# Patient Record
Sex: Female | Born: 1945 | Race: Black or African American | Hispanic: No | Marital: Married | Smoking: Never smoker
Health system: Southern US, Community
[De-identification: ages and names within clinical notes are randomized; demographics above are authoritative.]

## PROBLEM LIST (undated history)

## (undated) DIAGNOSIS — Z923 Personal history of irradiation: Secondary | ICD-10-CM

## (undated) DIAGNOSIS — I10 Essential (primary) hypertension: Secondary | ICD-10-CM

## (undated) DIAGNOSIS — K219 Gastro-esophageal reflux disease without esophagitis: Secondary | ICD-10-CM

## (undated) DIAGNOSIS — C779 Secondary and unspecified malignant neoplasm of lymph node, unspecified: Secondary | ICD-10-CM

## (undated) DIAGNOSIS — J189 Pneumonia, unspecified organism: Secondary | ICD-10-CM

## (undated) DIAGNOSIS — M8580 Other specified disorders of bone density and structure, unspecified site: Secondary | ICD-10-CM

## (undated) DIAGNOSIS — C50919 Malignant neoplasm of unspecified site of unspecified female breast: Secondary | ICD-10-CM

## (undated) DIAGNOSIS — C50912 Malignant neoplasm of unspecified site of left female breast: Secondary | ICD-10-CM

## (undated) DIAGNOSIS — Z9221 Personal history of antineoplastic chemotherapy: Secondary | ICD-10-CM

## (undated) DIAGNOSIS — Z973 Presence of spectacles and contact lenses: Secondary | ICD-10-CM

## (undated) HISTORY — PX: BREAST BIOPSY: SHX20

## (undated) HISTORY — DX: Essential (primary) hypertension: I10

## (undated) HISTORY — DX: Other specified disorders of bone density and structure, unspecified site: M85.80

---

## 1977-07-27 HISTORY — PX: TUBAL LIGATION: SHX77

## 1990-07-27 HISTORY — PX: APPENDECTOMY: SHX54

## 1998-01-04 ENCOUNTER — Ambulatory Visit (HOSPITAL_COMMUNITY): Admission: RE | Admit: 1998-01-04 | Discharge: 1998-01-04 | Payer: Self-pay | Admitting: Internal Medicine

## 1998-06-25 ENCOUNTER — Ambulatory Visit (HOSPITAL_COMMUNITY): Admission: RE | Admit: 1998-06-25 | Discharge: 1998-06-25 | Payer: Self-pay | Admitting: Obstetrics

## 1998-06-25 ENCOUNTER — Encounter: Payer: Self-pay | Admitting: Obstetrics

## 1998-07-27 HISTORY — PX: CHOLECYSTECTOMY: SHX55

## 1998-11-08 ENCOUNTER — Ambulatory Visit: Admission: RE | Admit: 1998-11-08 | Discharge: 1998-11-08 | Payer: Self-pay | Admitting: General Surgery

## 1998-12-05 ENCOUNTER — Encounter (HOSPITAL_BASED_OUTPATIENT_CLINIC_OR_DEPARTMENT_OTHER): Payer: Self-pay | Admitting: General Surgery

## 1998-12-05 ENCOUNTER — Ambulatory Visit (HOSPITAL_COMMUNITY): Admission: RE | Admit: 1998-12-05 | Discharge: 1998-12-06 | Payer: Self-pay | Admitting: General Surgery

## 1999-02-04 ENCOUNTER — Ambulatory Visit (HOSPITAL_COMMUNITY): Admission: RE | Admit: 1999-02-04 | Discharge: 1999-02-04 | Payer: Self-pay | Admitting: Internal Medicine

## 1999-02-04 ENCOUNTER — Encounter: Payer: Self-pay | Admitting: Internal Medicine

## 1999-06-04 ENCOUNTER — Encounter: Payer: Self-pay | Admitting: *Deleted

## 1999-06-04 ENCOUNTER — Ambulatory Visit (HOSPITAL_COMMUNITY): Admission: RE | Admit: 1999-06-04 | Discharge: 1999-06-04 | Payer: Self-pay | Admitting: *Deleted

## 1999-06-09 ENCOUNTER — Encounter: Admission: RE | Admit: 1999-06-09 | Discharge: 1999-06-09 | Payer: Self-pay | Admitting: *Deleted

## 1999-06-09 ENCOUNTER — Encounter: Payer: Self-pay | Admitting: *Deleted

## 2001-03-29 ENCOUNTER — Other Ambulatory Visit: Admission: RE | Admit: 2001-03-29 | Discharge: 2001-03-29 | Payer: Self-pay | Admitting: Internal Medicine

## 2009-08-08 ENCOUNTER — Ambulatory Visit (HOSPITAL_COMMUNITY): Admission: RE | Admit: 2009-08-08 | Discharge: 2009-08-08 | Payer: Self-pay | Admitting: Internal Medicine

## 2012-02-22 ENCOUNTER — Other Ambulatory Visit: Payer: Self-pay | Admitting: Internal Medicine

## 2012-02-24 ENCOUNTER — Other Ambulatory Visit: Payer: Self-pay | Admitting: Internal Medicine

## 2012-02-24 DIAGNOSIS — N644 Mastodynia: Secondary | ICD-10-CM

## 2012-03-08 ENCOUNTER — Other Ambulatory Visit: Payer: Self-pay | Admitting: Radiology

## 2012-03-09 ENCOUNTER — Other Ambulatory Visit: Payer: Self-pay | Admitting: Radiology

## 2012-03-09 DIAGNOSIS — C50911 Malignant neoplasm of unspecified site of right female breast: Secondary | ICD-10-CM

## 2012-03-14 ENCOUNTER — Other Ambulatory Visit: Payer: Self-pay

## 2012-03-14 ENCOUNTER — Telehealth (INDEPENDENT_AMBULATORY_CARE_PROVIDER_SITE_OTHER): Payer: Self-pay | Admitting: General Surgery

## 2012-03-14 NOTE — Telephone Encounter (Signed)
LMOM asking pt to return my call.  This is in regards to me having to move her appt time with Dr. Carolynne Edouard

## 2012-03-24 ENCOUNTER — Encounter (INDEPENDENT_AMBULATORY_CARE_PROVIDER_SITE_OTHER): Payer: Self-pay | Admitting: General Surgery

## 2012-03-25 ENCOUNTER — Ambulatory Visit (INDEPENDENT_AMBULATORY_CARE_PROVIDER_SITE_OTHER): Payer: 59 | Admitting: General Surgery

## 2012-03-25 ENCOUNTER — Other Ambulatory Visit (INDEPENDENT_AMBULATORY_CARE_PROVIDER_SITE_OTHER): Payer: Self-pay | Admitting: General Surgery

## 2012-03-25 ENCOUNTER — Encounter (INDEPENDENT_AMBULATORY_CARE_PROVIDER_SITE_OTHER): Payer: Self-pay | Admitting: General Surgery

## 2012-03-25 VITALS — BP 220/108 | HR 110 | Temp 99.0°F | Resp 16 | Ht 63.0 in | Wt 141.0 lb

## 2012-03-25 DIAGNOSIS — C50919 Malignant neoplasm of unspecified site of unspecified female breast: Secondary | ICD-10-CM | POA: Insufficient documentation

## 2012-03-25 NOTE — Patient Instructions (Addendum)
Will refer to medical oncology and interventional radiology    Port-A-Cath placement at [facility] on 04/01/12.  You need to arrive there at 10:00a with a driver for before and after surgery.  Nothing to eat or drink after midnight the night before surgery.

## 2012-04-01 ENCOUNTER — Other Ambulatory Visit (HOSPITAL_COMMUNITY): Payer: Self-pay

## 2012-04-04 ENCOUNTER — Telehealth: Payer: Self-pay | Admitting: *Deleted

## 2012-04-04 NOTE — Telephone Encounter (Signed)
Called patient to schedule a med onc appt and she refused.  Stated that she does not want to have all the chemo Dr. Carolynne Edouard has spoken with her about so she will follow up with her primary and go an alternate route.  Emailed Musician at [facility] to make her aware.

## 2012-04-05 ENCOUNTER — Encounter (INDEPENDENT_AMBULATORY_CARE_PROVIDER_SITE_OTHER): Payer: Self-pay | Admitting: General Surgery

## 2012-04-05 NOTE — Progress Notes (Signed)
Subjective:     Patient ID: Janice Powell, female   DOB: Dec 27, 1945, 66 y.o.   MRN: [id number]  HPI We're asked to see the patient in consultation by Dr. Isabell Jarvis to evaluate her for a right breast cancer. The patient is a 66 year old black female who has noticed a lump in her right breast for the last 2 years. She has been ignoring it. She has experienced some pain in the right breast. The lump has gradually been getting larger. She has not been going for her routine mammograms. She was advised to attend the multidisciplinary breast clinic where she could meet with all of the doctors involved in her care at one time and she refused. She comes in now for evaluation.  Review of Systems  Constitutional: Negative.   HENT: Negative.   Eyes: Negative.   Respiratory: Negative.   Cardiovascular: Negative.   Gastrointestinal: Negative.   Genitourinary: Negative.   Musculoskeletal: Negative.   Skin: Negative.   Neurological: Negative.   Hematological: Negative.   Psychiatric/Behavioral: Negative.        Objective:   Physical Exam  Constitutional: She is oriented to person, place, and time. She appears well-developed and well-nourished.  HENT:  Head: Normocephalic and atraumatic.  Eyes: Conjunctivae and EOM are normal. Pupils are equal, round, and reactive to light.  Neck: Normal range of motion. Neck supple.  Cardiovascular: Normal rate, regular rhythm and normal heart sounds.   Pulmonary/Chest: Effort normal and breath sounds normal.       The patient has a large mass centrally in the right breast with some overlying skin changes of redness and thickening. She also has a palpable enlarged adenopathy in the right axilla.  Abdominal: Soft. Bowel sounds are normal. She exhibits no mass. There is no tenderness.  Musculoskeletal: Normal range of motion.  Lymphadenopathy:    She has no cervical adenopathy.  Neurological: She is alert and oriented to person, place, and time.  Skin: Skin is warm  and dry.  Psychiatric: She has a normal mood and affect. Her behavior is normal.       Assessment:     The patient has a locally advanced right breast cancer with clinically positive lymph nodes in the right axilla. Because of the size of the tumor and the extensive involvement of the skin I have strongly recommended that she meet with the medical oncologist and consider neoadjuvant chemotherapy. I do not think if we did surgery up front that we would be able to get clean margins. I have discussed her situation with her in detail and I think she is in denial about her diagnosis.    Plan:     I have made her appointments with the medical oncologist and also an appointment to have a Port-A-Cath placed. We will plan to see her back when she is met with the oncologist.

## 2012-04-28 ENCOUNTER — Encounter (INDEPENDENT_AMBULATORY_CARE_PROVIDER_SITE_OTHER): Payer: 59 | Admitting: General Surgery

## 2012-06-06 DIAGNOSIS — N61 Mastitis without abscess: Secondary | ICD-10-CM | POA: Diagnosis not present

## 2012-06-06 DIAGNOSIS — N644 Mastodynia: Secondary | ICD-10-CM | POA: Diagnosis not present

## 2012-06-06 DIAGNOSIS — C50919 Malignant neoplasm of unspecified site of unspecified female breast: Secondary | ICD-10-CM | POA: Diagnosis not present

## 2012-06-07 DIAGNOSIS — N644 Mastodynia: Secondary | ICD-10-CM | POA: Diagnosis not present

## 2012-06-07 DIAGNOSIS — C50919 Malignant neoplasm of unspecified site of unspecified female breast: Secondary | ICD-10-CM | POA: Diagnosis not present

## 2012-06-07 DIAGNOSIS — N61 Mastitis without abscess: Secondary | ICD-10-CM | POA: Diagnosis not present

## 2012-06-09 ENCOUNTER — Telehealth: Payer: Self-pay | Admitting: *Deleted

## 2012-06-09 NOTE — Telephone Encounter (Signed)
Confirmed 06/17/12 appt w/ pt.  Mailed before appt letter & packet to pt.  Emailed Musician at [facility] to make aware.  Called Pat at Dr. Carolee Rota office to make aware.  Took paperwork to Med Rec to scan.

## 2012-06-17 ENCOUNTER — Telehealth: Payer: Self-pay | Admitting: Oncology

## 2012-06-17 ENCOUNTER — Ambulatory Visit (HOSPITAL_BASED_OUTPATIENT_CLINIC_OR_DEPARTMENT_OTHER): Payer: Medicare Other | Admitting: Oncology

## 2012-06-17 ENCOUNTER — Encounter: Payer: Self-pay | Admitting: Oncology

## 2012-06-17 ENCOUNTER — Other Ambulatory Visit (HOSPITAL_BASED_OUTPATIENT_CLINIC_OR_DEPARTMENT_OTHER): Payer: Medicare Other | Admitting: Lab

## 2012-06-17 ENCOUNTER — Other Ambulatory Visit: Payer: Self-pay | Admitting: *Deleted

## 2012-06-17 ENCOUNTER — Ambulatory Visit: Payer: Medicare Other

## 2012-06-17 VITALS — BP 232/115 | HR 112 | Temp 98.1°F | Resp 20 | Ht 63.0 in | Wt 141.5 lb

## 2012-06-17 DIAGNOSIS — C50919 Malignant neoplasm of unspecified site of unspecified female breast: Secondary | ICD-10-CM | POA: Diagnosis not present

## 2012-06-17 DIAGNOSIS — C773 Secondary and unspecified malignant neoplasm of axilla and upper limb lymph nodes: Secondary | ICD-10-CM

## 2012-06-17 DIAGNOSIS — Z171 Estrogen receptor negative status [ER-]: Secondary | ICD-10-CM

## 2012-06-17 DIAGNOSIS — C50319 Malignant neoplasm of lower-inner quadrant of unspecified female breast: Secondary | ICD-10-CM | POA: Diagnosis not present

## 2012-06-17 LAB — COMPREHENSIVE METABOLIC PANEL (CC13)
BUN: 13 mg/dL (ref 7.0–26.0)
CO2: 30 mEq/L — ABNORMAL HIGH (ref 22–29)
Calcium: 9.7 mg/dL (ref 8.4–10.4)
Chloride: 106 mEq/L (ref 98–107)
Creatinine: 0.9 mg/dL (ref 0.6–1.1)
Glucose: 141 mg/dl — ABNORMAL HIGH (ref 70–99)

## 2012-06-17 LAB — CBC WITH DIFFERENTIAL/PLATELET
EOS%: 0.3 % (ref 0.0–7.0)
MCH: 32 pg (ref 25.1–34.0)
MCV: 95 fL (ref 79.5–101.0)
MONO%: 9.5 % (ref 0.0–14.0)
NEUT#: 4.6 10*3/uL (ref 1.5–6.5)
RBC: 4.2 10*6/uL (ref 3.70–5.45)
RDW: 14.3 % (ref 11.2–14.5)

## 2012-06-17 NOTE — Telephone Encounter (Signed)
gve the pt her nov,dec 2013 appt calendar. Pt is aware that she will be contacted with the pet scan/ct scan appts

## 2012-06-17 NOTE — Progress Notes (Signed)
Checked in new pt with no financial concerns. °

## 2012-06-17 NOTE — Progress Notes (Signed)
ID: Janice Powell   DOB: 23-May-1946  MR#: [id number]  CSN#:[id number]  PCP: Janice Moh, MD GYN:  SUClaud Kelp MD OTHER MD:   HISTORY OF PRESENT ILLNESS: [name] noted a mass in her right breast sometime in 2011. 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 More recently her husband twisted her arm to get the mass looked at, and she brought it to Dr. Carolee Powell attention. Diagnostic mammography and right ultrasonography at [facility] 03/02/2012 showed a 6.78 cm irregular mass in the inner aspect of the right breast. There was nipple retraction and erythema around the nipple. The largest lymph node was noted in the right axilla. By ultrasound the large irregular hypoechoic mass was noted in the breast with multiple enlarged axillary lymph nodes. Physical exam confirmed a hard breast with erythema around the right nipple extending inferiorly. The nipple is slightly inverted.  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 Her subsequent history is as detailed below  INTERVAL HISTORY: Janice Powell a came with her husband Janice Powell to the breast clinic 06/17/2012 for discussion of her situation and a definitive treatment plan.  REVIEW OF SYSTEMS: There is some soreness in the right breast on the but no unusual headaches, visual changes, nausea, vomiting, dizziness, cough, phlegm production, pleurisy, shortness of breath, or unexplained weight loss or fatigue. A detailed review of systems today was otherwise entirely negative.  PAST MEDICAL HISTORY: Past Medical History  Diagnosis Date  . Hypertension   . Cancer     right breast  GERD osteopenia  PAST SURGICAL HISTORY: Past Surgical History  Procedure Date  . Appendectomy 1992  . Tubal ligation 1979  . Cholecystectomy 2000    FAMILY HISTORY Family History  Problem Relation Age of Onset  . Heart disease Mother    the patient's father died in his sleep at age 92. The patient's mother died at the age of 51 from a myocardial infarction. The patient has one brother and 2 sisters, all surviving. There is no history of breast or ovarian cancer in the family.  GYNECOLOGIC HISTORY: Menarche age 2, first live birth age 62, she is GX P5, menopause age 15. She did not take hormone replacement.  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 Her husband Janice Powell worked in the post office 41 years, and is also now retired. He is a former Arts development officer. Son Janice Powell lives in [place] and manages an [employer] store. Daughter Janice Powell also lives in [place] and manages the IT Department at [employer]. Son Janice Powell is a Therapist, sports. Daughter Janice Powell is a Doctor, hospital for [employer]. Son Janice Powell is an Teaching laboratory technician. The patient has 8 grandchildren. She attends the white [employer]   ADVANCED DIRECTIVES:  HEALTH MAINTENANCE: History  Substance Use Topics  . Smoking status: Never Smoker   . Smokeless tobacco: Not on file  . Alcohol Use: No     Colonoscopy:  PAP:  Bone density:  Lipid panel:  No Known Allergies  Current Outpatient Prescriptions  Medication Sig Dispense Refill  . cephALEXin (KEFLEX) 500 MG capsule Take 500 mg by mouth 4 (four) times daily.      . hydrocodone-acetaminophen (LORCET-HD) 5-500 MG per capsule Take 1 capsule by mouth 3 (three) times daily as needed.        OBJECTIVE:  Middle-aged [place] American woman who appears well Filed Vitals:   06/17/12 1400  BP: 232/115  Pulse: 112  Temp: 98.1 F (36.7 C)  Resp: 20     Body mass index is 25.07 kg/(m^2).    ECOG FS: 0  Sclerae unicteric Oropharynx clear No cervical or supraclavicular adenopathy Lungs no rales or rhonchi Heart regular rate and rhythm Abd benign MSK no focal spinal tenderness, no peripheral edema Neuro: nonfocal Breasts: The right breast is  firm, and there is significant erythema over the entire central aspect of the breast. There is involvement of the skin by tumor around the areola. There is palpable adenopathy in the right axilla. This entire area was photographed today. The left breast and left exam are unremarkable   LAB RESULTS: Lab Results  Component Value Date   WBC 7.5 06/17/2012      Chemistry      Component Value Date/Time   NA 142 06/17/2012 1349      Component Value Date/Time   CALCIUM 9.7 06/17/2012 1349       No results found for this basename: LABCA2    No components found with this basename: LABCA125    No results found for this basename: INR:1;PROTIME:1 in the last 168 hours  Urinalysis No results found for this basename: colorurine,  appearanceur,  labspec,  phurine,  glucoseu,  hgbur,  bilirubinur,  ketonesur,  proteinur,  urobilinogen,  nitrite,  leukocytesur    STUDIES: No results found.  ASSESSMENT: 66 y.o. [place] woman s/p Right breast and axillary node biopsy 03/08/2012 for a clinical T3-T4, N1-2 invasive ductal carcinoma, triple negative, with an MIB-1 of 93%.  PLAN: We spent well over an hour today discussing the nature of breast cancer and the details of Ms. Wood's specific breast cancer. She understands her cancer is at least stage Powell, and may already be stage IV. Because the treatment of stage Powell and stage IV breast cancer is very different, we need to obtain some scans and these are being operationalized.  We'll so went over her pathology report in detail. Because she has a triple-negative breast cancer, the only systemic treatment option for her as chemotherapy. We specifically discussed docetaxel/ cyclophosphamide/ doxorubicin, to be received by port every 3 weeks for a total of 6 cycles. We have a tentative starting day 8 of December 9, and she will see my physician's assistant December 5 2 receive her antiestrogen prescriptions and instructions on how to take her  medications for nausea and how to use the EMLA cream. The patient says she would like to switch to Dr. Derrell Lolling (though she has not met him) and we will ask him to place a port before December 5 if possible. I directed the patient to "second to nature" to start looking for wakes and alternative brought his. I plan to reassess her treatments after 3 cycles and consider switching to carboplatin/gemcitabine if we are not having a brisk response   Teosha Casso C    06/17/2012

## 2012-06-18 ENCOUNTER — Other Ambulatory Visit: Payer: Self-pay | Admitting: Oncology

## 2012-06-18 LAB — CANCER ANTIGEN 27.29: CA 27.29: 110 U/mL — ABNORMAL HIGH (ref 0–39)

## 2012-06-21 ENCOUNTER — Other Ambulatory Visit: Payer: Medicare Other

## 2012-06-21 ENCOUNTER — Telehealth: Payer: Self-pay | Admitting: *Deleted

## 2012-06-21 NOTE — Telephone Encounter (Signed)
Patient wants to take some time to think about whether she want chemo or not.  Encouraged pt. To let us know next week so can reschedule.

## 2012-06-22 ENCOUNTER — Encounter (HOSPITAL_COMMUNITY): Admission: RE | Admit: 2012-06-22 | Payer: Medicare Other | Source: Ambulatory Visit

## 2012-06-22 ENCOUNTER — Encounter: Payer: Self-pay | Admitting: *Deleted

## 2012-06-22 ENCOUNTER — Ambulatory Visit (HOSPITAL_COMMUNITY): Payer: Medicare Other

## 2012-06-22 NOTE — Progress Notes (Signed)
Mailed after appt letter to pt. 

## 2012-06-27 ENCOUNTER — Other Ambulatory Visit (INDEPENDENT_AMBULATORY_CARE_PROVIDER_SITE_OTHER): Payer: Self-pay | Admitting: General Surgery

## 2012-06-27 ENCOUNTER — Telehealth: Payer: Self-pay | Admitting: *Deleted

## 2012-06-27 NOTE — Telephone Encounter (Signed)
This RN called and left a message on identified VM for a return call to follow up on pt's scheduled appointment this week.  Call was made due to note per education RN discussion with patient.  MD is aware of situation.

## 2012-06-30 ENCOUNTER — Other Ambulatory Visit: Payer: Self-pay | Admitting: Emergency Medicine

## 2012-06-30 ENCOUNTER — Other Ambulatory Visit: Payer: Self-pay | Admitting: Oncology

## 2012-06-30 ENCOUNTER — Ambulatory Visit: Payer: Medicare Other | Admitting: Physician Assistant

## 2012-07-01 ENCOUNTER — Other Ambulatory Visit: Payer: Self-pay | Admitting: Emergency Medicine

## 2012-07-01 ENCOUNTER — Telehealth: Payer: Self-pay | Admitting: Oncology

## 2012-07-01 MED ORDER — HYDROCODONE-ACETAMINOPHEN 5-500 MG PO CAPS: 1.0000 | ORAL_CAPSULE | Freq: Three times a day (TID) | ORAL | Status: DC | PRN

## 2012-07-01 NOTE — Telephone Encounter (Signed)
S/w the pt and she is aware of her Monday appts on 07/04/2012

## 2012-07-04 ENCOUNTER — Telehealth: Payer: Self-pay | Admitting: *Deleted

## 2012-07-04 ENCOUNTER — Ambulatory Visit (HOSPITAL_BASED_OUTPATIENT_CLINIC_OR_DEPARTMENT_OTHER): Payer: Medicare Other | Admitting: Oncology

## 2012-07-04 VITALS — BP 230/100 | HR 97 | Temp 97.3°F | Resp 20 | Ht 63.0 in | Wt 140.9 lb

## 2012-07-04 DIAGNOSIS — C773 Secondary and unspecified malignant neoplasm of axilla and upper limb lymph nodes: Secondary | ICD-10-CM

## 2012-07-04 DIAGNOSIS — Z171 Estrogen receptor negative status [ER-]: Secondary | ICD-10-CM | POA: Diagnosis not present

## 2012-07-04 DIAGNOSIS — I10 Essential (primary) hypertension: Secondary | ICD-10-CM

## 2012-07-04 DIAGNOSIS — C50319 Malignant neoplasm of lower-inner quadrant of unspecified female breast: Secondary | ICD-10-CM | POA: Diagnosis not present

## 2012-07-04 DIAGNOSIS — C50919 Malignant neoplasm of unspecified site of unspecified female breast: Secondary | ICD-10-CM

## 2012-07-04 MED ORDER — LISINOPRIL 10 MG PO TABS: 10.0000 mg | ORAL_TABLET | Freq: Every day | ORAL | Status: DC

## 2012-07-04 MED ORDER — LISINOPRIL 10 MG PO TABS
10.0000 mg | ORAL_TABLET | Freq: Every day | ORAL | Status: DC
Start: 2012-07-04 — End: 2012-07-04

## 2012-07-04 MED ORDER — HYDROCODONE-ACETAMINOPHEN 5-500 MG PO CAPS: 1.0000 | ORAL_CAPSULE | Freq: Three times a day (TID) | ORAL | Status: DC | PRN

## 2012-07-04 MED ORDER — METOPROLOL SUCCINATE ER 50 MG PO TB24: 50.0000 mg | ORAL_TABLET | Freq: Every day | ORAL | Status: DC

## 2012-07-04 NOTE — Telephone Encounter (Signed)
Made patient appointment for 08-23-2012 starting at 10:15am at [facility]

## 2012-07-04 NOTE — Progress Notes (Signed)
ID: Janice Powell   DOB: Nov 27, 1945  MR#: [id number]  CSN#:[id number]  PCP: Janice Moh, MD GYN:  SUClaud Kelp MD OTHER MD:   HISTORY OF PRESENT ILLNESS: [name] noted a mass in her right breast sometime in 2011. 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 More recently her husband twisted her arm to get the mass looked at, and she brought it to Dr. Carolee Powell attention. Diagnostic mammography and right ultrasonography at [facility] 03/02/2012 showed a 6.78 cm irregular mass in the inner aspect of the right breast. There was nipple retraction and erythema around the nipple. The largest lymph node was noted in the right axilla. By ultrasound the large irregular hypoechoic mass was noted in the breast with multiple enlarged axillary lymph nodes. Physical exam confirmed a hard breast with erythema around the right nipple extending inferiorly. The nipple is slightly inverted.  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 Her subsequent history is as detailed below  INTERVAL HISTORY: Janice Powell was seen today with her husband Janice Powell to discuss treatment of her breast cancer further. At the last visit, we had set her up for "chemotherapy school", and staging studies including a PET scan. She had also been set up for port placement. She canceled all these appointments, and has started on "natural treatments" which she purchased after watching an ad on television. She has made it minor changes to her diet as well, but "I always had a good diet". She is exercising every other day.  REVIEW OF SYSTEMS: She continues to have pain in the right breast. She takes Vicodin for this about 3 times daily. This is not constipating her. She also has itching and a small discharge from the right nipple. The discharge is currently clear. She's putting a little bit of Neosporin cream over this area.  Otherwise a detailed review of systems today was entirely negative, and in particular she denies any unusual headaches, visual changes, nausea, vomiting, altered taste, cough, phlegm production, pleurisy, shortness of breath, or any other areas of pain, fever, rash or bleeding. A detailed review of systems was otherwise noncontributory  PAST MEDICAL HISTORY: Past Medical History  Diagnosis Date  . Hypertension   . Cancer     right breast  GERD osteopenia  PAST SURGICAL HISTORY: Past Surgical History  Procedure Date  . Appendectomy 1992  . Tubal ligation 1979  . Cholecystectomy 2000    FAMILY HISTORY Family History  Problem Relation Age of Onset  . Heart disease Mother    the patient's father died in his sleep at age 44. The patient's mother died at the age of 35 from a myocardial infarction. The patient has one brother and 2 sisters, all surviving. There is no history of breast or ovarian cancer in the family.  GYNECOLOGIC HISTORY: Menarche age 76, first live birth age 37, she is GX P5, menopause age 35. She did not take hormone replacement.  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 She is a Janice Powell. Her husband Janice Powell worked in the post office 41 years, and is also now retired. He is a former Arts development officer. Son Janice Powell lives in [place] and manages an [employer] store. Daughter Janice Powell also lives in [place] and manages the IT Department at [employer]. Son Janice Powell is a Therapist, sports. Daughter Janice Powell is a Marine scientist  coordinator for [employer]. Son Janice Powell is an Teaching laboratory technician. The patient has 8 grandchildren. She attends the white [employer]   ADVANCED DIRECTIVES:  HEALTH MAINTENANCE: History  Substance Use Topics  . Smoking status: Never Smoker   . Smokeless tobacco: Not on file  . Alcohol Use: No     Colonoscopy:  PAP:  Bone density:  Lipid panel:  No Known Allergies  Current Outpatient Prescriptions   Medication Sig Dispense Refill  . cephALEXin (KEFLEX) 500 MG capsule Take 500 mg by mouth 4 (four) times daily.      . hydrocodone-acetaminophen (LORCET-HD) 5-500 MG per capsule Take 1 capsule by mouth 3 (three) times daily as needed.  12 capsule  0    OBJECTIVE: Middle-aged [place] American woman in no acute distress Filed Vitals:   07/04/12 1209  BP: 230/100  Pulse: 97  Temp: 97.3 F (36.3 C)  Resp: 20     Body mass index is 24.96 kg/(m^2).    ECOG FS: 1  Sclerae unicteric Oropharynx clear The right neck at [facility] slightly fuller than the left, but I do not palpate definite adenopathy in that area Lungs no rales or rhonchi, good excursion bilaterally Heart regular rate and rhythm Abd soft, nontender, positive bowel sounds, no organomegaly  MSK no focal spinal tenderness Neuro: nonfocal, alert and oriented x3 Breasts: The right breast is hard, barely movable, with a central mass measuring at least 8 cm. The area around the nipple is clearly involved, with erythema which is tracking towards the midline; we photographed the breast today to serve as a baseline. The right axilla shows a fixed lymph node mass measuring 2-3 cm. The left breast is unremarkable. The left axilla is clear.   LAB RESULTS: Lab Results  Component Value Date   WBC 7.5 06/17/2012      Chemistry      Component Value Date/Time   NA 142 06/17/2012 1349      Component Value Date/Time   CALCIUM 9.7 06/17/2012 1349       Lab Results  Component Value Date   LABCA2 110* 06/17/2012    No components found with this basename: LABCA125    No results found for this basename: INR:1;PROTIME:1 in the last 168 hours  Urinalysis No results found for this basename: colorurine,  appearanceur,  labspec,  phurine,  glucoseu,  hgbur,  bilirubinur,  ketonesur,  proteinur,  urobilinogen,  nitrite,  leukocytesur    STUDIES: No results found.  ASSESSMENT: 66 y.o. [name] woman s/p Right breast and axillary  node biopsy 03/08/2012 for a clinical T3-T4, N1-2 , stage Powell invasive ductal carcinoma, triple negative, with an MIB-1 of 93%.  PLAN: We went over her situation again, and I gave her a copy of the Adjuvant! data. This shows that women like her who have surgery alone have a 63% chance of dying from their breast cancer within 10 years. Chemotherapy saves a lives of 12 women like her. This is the standard treatment and it is our recommendation. At this point, however, Ms. Lucente does not want to proceed to chemotherapy, or to surgery. She would like to try "natural treatments". She refuses port placement and staging studies as well.   She agrees to reevaluation "after 2 months", so I have made her a return appointment to see me in 2 months, with lab work and a repeat mammogram and ultrasound of the right breast just before the visit.(She did not want to return to [facility] because she "does not  understand" why they took "so many biopsies" from her breast; I suggested she discuss this with Dr. Yolanda Bonine, but what she was to do is switch over to the Breast Center).  I am concerned that there is some erythema tracking medially. I explained to her that if her cancer crosses the midline it is going to be very difficult if not impossible to surgically remove all the tumor.  Finally her blood pressure was quite high today. This has been noted previously by her primary care physician, and she has not wanted to take blood pressure medication because of side effects. I went ahead and wrote her for lisinopril 10 mg and metoprolol 50 mg to take daily and asked her to have her blood pressure checked either here or at Dr. Carolee Powell office within the week.  I refilled her Vicodin, giving her 90 tablets. She will call if she has other problems before her next visit here.  Keisean Skowron C    07/04/2012

## 2012-07-08 ENCOUNTER — Ambulatory Visit: Admit: 2012-07-08 | Payer: Self-pay | Admitting: General Surgery

## 2012-07-08 SURGERY — INSERTION, TUNNELED CENTRAL VENOUS DEVICE, WITH PORT
Anesthesia: General

## 2012-07-19 ENCOUNTER — Telehealth: Payer: Self-pay | Admitting: *Deleted

## 2012-07-19 ENCOUNTER — Other Ambulatory Visit: Payer: Self-pay | Admitting: Oncology

## 2012-07-19 ENCOUNTER — Other Ambulatory Visit: Payer: Self-pay | Admitting: *Deleted

## 2012-07-19 DIAGNOSIS — C50919 Malignant neoplasm of unspecified site of unspecified female breast: Secondary | ICD-10-CM

## 2012-07-19 MED ORDER — HYDROCODONE-ACETAMINOPHEN 5-325 MG PO TABS: 2.0000 | ORAL_TABLET | Freq: Four times a day (QID) | ORAL | Status: DC | PRN

## 2012-07-19 NOTE — Telephone Encounter (Signed)
Patient called in for refill request, auto-reject from pharmacy sent to triage due to refill being requested too early. Called and spoke to patient, she states she is taking approx. 5-6 tabs a day. She states it is her breast hurting. Informed patient I would get with Dr Darnelle Catalan and his staff to see if we need to change plan for pain control. She will be in and out today, she can be reached on her cell at [phone]. Informed her we will call her back once Dr Darnelle Catalan has reviewed this information

## 2012-08-01 ENCOUNTER — Other Ambulatory Visit: Payer: Self-pay | Admitting: *Deleted

## 2012-08-01 NOTE — Progress Notes (Signed)
Message left by pt stating " please proceed with making me an appointment to get a port, I am ready to do what is needed ".  This RN returned call to pt and left message stating above received, will give to MD for orders as well as making appointments - this RN did leave her name with request to call if further questions.  This message given to MD.

## 2012-08-02 ENCOUNTER — Encounter (INDEPENDENT_AMBULATORY_CARE_PROVIDER_SITE_OTHER): Payer: Self-pay

## 2012-08-02 ENCOUNTER — Telehealth (INDEPENDENT_AMBULATORY_CARE_PROVIDER_SITE_OTHER): Payer: Self-pay | Admitting: General Surgery

## 2012-08-02 ENCOUNTER — Telehealth: Payer: Self-pay | Admitting: *Deleted

## 2012-08-02 ENCOUNTER — Other Ambulatory Visit: Payer: Self-pay | Admitting: *Deleted

## 2012-08-02 NOTE — Telephone Encounter (Signed)
This RN called to Dr Jacinto Halim office and requested an appointment for port placement. Janice Powell will forward request to Dr Derrell Lolling to obtain an appointment ASAP.   This RN spoke with pt discuss other orders not obtiained- staging study and chemo education. Janice Powell states she does not want to do staging study but she does want to proceed with the class.  Electronic POF sent to scheduling for needed appointments.

## 2012-08-02 NOTE — Telephone Encounter (Signed)
Janice Powell with Dr Magrinat's office called and stated patient is ready to go ahead with her PAC insertion. Note from [place] in the system. Orders should be with scheduling. Please reschedule her.

## 2012-08-04 ENCOUNTER — Other Ambulatory Visit: Payer: Self-pay | Admitting: *Deleted

## 2012-08-04 ENCOUNTER — Other Ambulatory Visit: Payer: Self-pay | Admitting: Oncology

## 2012-08-05 DIAGNOSIS — Z23 Encounter for immunization: Secondary | ICD-10-CM | POA: Diagnosis not present

## 2012-08-08 ENCOUNTER — Encounter (HOSPITAL_COMMUNITY): Payer: Self-pay | Admitting: Pharmacy Technician

## 2012-08-09 ENCOUNTER — Other Ambulatory Visit: Payer: Medicare Other

## 2012-08-09 ENCOUNTER — Encounter: Payer: Self-pay | Admitting: *Deleted

## 2012-08-11 ENCOUNTER — Telehealth: Payer: Self-pay | Admitting: *Deleted

## 2012-08-11 ENCOUNTER — Encounter (HOSPITAL_COMMUNITY): Payer: Self-pay

## 2012-08-11 ENCOUNTER — Encounter (HOSPITAL_COMMUNITY)
Admission: RE | Admit: 2012-08-11 | Discharge: 2012-08-11 | Disposition: A | Discharge status: Home / Self Care | Payer: Medicare Other | Source: Ambulatory Visit | Attending: General Surgery | Admitting: General Surgery

## 2012-08-11 ENCOUNTER — Ambulatory Visit (HOSPITAL_BASED_OUTPATIENT_CLINIC_OR_DEPARTMENT_OTHER): Payer: Medicare Other | Admitting: Oncology

## 2012-08-11 DIAGNOSIS — C773 Secondary and unspecified malignant neoplasm of axilla and upper limb lymph nodes: Secondary | ICD-10-CM | POA: Diagnosis not present

## 2012-08-11 DIAGNOSIS — C50919 Malignant neoplasm of unspecified site of unspecified female breast: Secondary | ICD-10-CM | POA: Diagnosis not present

## 2012-08-11 DIAGNOSIS — Z171 Estrogen receptor negative status [ER-]: Secondary | ICD-10-CM

## 2012-08-11 DIAGNOSIS — C50319 Malignant neoplasm of lower-inner quadrant of unspecified female breast: Secondary | ICD-10-CM | POA: Diagnosis not present

## 2012-08-11 DIAGNOSIS — Z79899 Other long term (current) drug therapy: Secondary | ICD-10-CM | POA: Diagnosis not present

## 2012-08-11 DIAGNOSIS — I10 Essential (primary) hypertension: Secondary | ICD-10-CM | POA: Diagnosis not present

## 2012-08-11 DIAGNOSIS — Z01810 Encounter for preprocedural cardiovascular examination: Secondary | ICD-10-CM | POA: Diagnosis not present

## 2012-08-11 DIAGNOSIS — Z01818 Encounter for other preprocedural examination: Secondary | ICD-10-CM | POA: Diagnosis not present

## 2012-08-11 DIAGNOSIS — Z01812 Encounter for preprocedural laboratory examination: Secondary | ICD-10-CM | POA: Diagnosis not present

## 2012-08-11 HISTORY — DX: Gastro-esophageal reflux disease without esophagitis: K21.9

## 2012-08-11 HISTORY — DX: Pneumonia, unspecified organism: J18.9

## 2012-08-11 LAB — CBC
MCH: 31.6 pg (ref 26.0–34.0)
MCV: 94 fL (ref 78.0–100.0)
Platelets: 272 10*3/uL (ref 150–400)
RBC: 4.5 MIL/uL (ref 3.87–5.11)
RDW: 14 % (ref 11.5–15.5)
WBC: 8.5 10*3/uL (ref 4.0–10.5)

## 2012-08-11 LAB — BASIC METABOLIC PANEL
CO2: 28 mEq/L (ref 19–32)
Calcium: 9.3 mg/dL (ref 8.4–10.5)
Creatinine, Ser: 0.75 mg/dL (ref 0.50–1.10)
GFR calc non Af Amer: 86 mL/min — ABNORMAL LOW (ref >90)
Glucose, Bld: 94 mg/dL (ref 70–99)
Sodium: 140 mEq/L (ref 135–145)

## 2012-08-11 LAB — SURGICAL PCR SCREEN: Staphylococcus aureus: NEGATIVE

## 2012-08-11 MED ORDER — DEXAMETHASONE 4 MG PO TABS: ORAL_TABLET | ORAL | Status: DC

## 2012-08-11 MED ORDER — LORAZEPAM 0.5 MG PO TABS: 0.5000 mg | ORAL_TABLET | Freq: Every evening | ORAL | Status: DC | PRN

## 2012-08-11 MED ORDER — LIDOCAINE-PRILOCAINE 2.5-2.5 % EX CREA: TOPICAL_CREAM | Freq: Once | CUTANEOUS | Status: DC

## 2012-08-11 MED ORDER — PROCHLORPERAZINE MALEATE 10 MG PO TABS: 10.0000 mg | ORAL_TABLET | Freq: Four times a day (QID) | ORAL | Status: DC | PRN

## 2012-08-11 NOTE — Progress Notes (Signed)
ID: Janice Powell   DOB: 1946-05-08  MR#: [id number]  CSN#:[id number]  PCP: Janice Moh, MD GYN:  SUClaud Kelp MD OTHER MD:   HISTORY OF PRESENT ILLNESS: [name] noted a mass in her right breast sometime in 2011. 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 More recently her husband twisted her arm to get the mass looked at, and she brought it to Dr. Carolee Rota attention. Diagnostic mammography and right ultrasonography at [facility] 03/02/2012 showed a 6.78 cm irregular mass in the inner aspect of the right breast. There was nipple retraction and erythema around the nipple. The largest lymph node was noted in the right axilla. By ultrasound the large irregular hypoechoic mass was noted in the breast with multiple enlarged axillary lymph nodes. Physical exam confirmed a hard breast with erythema around the right nipple extending inferiorly. The nipple is slightly inverted.  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 Her subsequent history is as detailed below  INTERVAL HISTORY: Janice Powell returns today for further discussion of her breast cancer. She called 08/02/2012 to tell us that she was now ready to consider ordered cath placement and chemotherapy. Incidentally she did start the lisinopril and metoprolol written for her last visit, and although her blood pressure here is still high at (she is very anxious today) she tells me her blood pressure earlier in the day was 144/86, which is certainly a great improvement.  REVIEW OF SYSTEMS: She is having significant pain associated with the right breast and right axilla. There is also a "bad odor" from her right breast. She did try to take natural past take remedy his (particularly minerals) but the pain and following her have convinced her that she needs to give the chemotherapy a try. She is not having severe headaches,  visual changes, nausea, vomiting, gait imbalance or dizziness, cough, phlegm production, pleurisy, or any change in bowel or bladder habits. She doesn't have any bony pain or other pain except associated with the right breast the lesion. A detailed review of systems today was otherwise noncontributory  PAST MEDICAL HISTORY: Past Medical History  Diagnosis Date  . Hypertension   . Cancer     right breast  . Pneumonia     hx  . GERD (gastroesophageal reflux disease)     occ  GERD osteopenia  PAST SURGICAL HISTORY: Past Surgical History  Procedure Date  . Appendectomy 1992  . Tubal ligation 1979  . Cholecystectomy 2000    FAMILY HISTORY Family History  Problem Relation Age of Onset  . Heart disease Mother    the patient's father died in his sleep at age 98. The patient's mother died at the age of 61 from a myocardial infarction. The patient has one brother and 2 sisters, all surviving. There is no history of breast or ovarian cancer in the family.  GYNECOLOGIC HISTORY: Menarche age 41, first live birth age 60, she is GX P5, menopause age 3. She did not take hormone replacement.  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 Her husband Janice Powell worked in the post office 41 years, and is also now retired. He is a former Arts development officer. Son Janice Powell lives in [place] and manages an [employer] store. Daughter Janice Powell also lives in [place] and manages the IT Department at [employer]. Son Janice Powell  is a Therapist, sports. Daughter Janice Powell is a Doctor, hospital for [employer]. Son Janice Powell is an Teaching laboratory technician. The patient has 8 grandchildren. She attends the white [employer]   ADVANCED DIRECTIVES: Not in place  HEALTH MAINTENANCE: History  Substance Use Topics  . Smoking status: Never Smoker   . Smokeless tobacco: Not on file  . Alcohol Use: No     Colonoscopy:  PAP:  Bone density:  Lipid panel:  Not on  File  Current Outpatient Prescriptions  Medication Sig Dispense Refill  . dexamethasone (DECADRON) 4 MG tablet Take 2 tablets by mouth twice a day as directed  30 tablet  3  . HYDROcodone-acetaminophen (NORCO/VICODIN) 5-325 MG per tablet Take 2 tablets by mouth every 6 (six) hours as needed for pain.  120 tablet  0  . lisinopril (PRINIVIL) 10 MG tablet Take 1 tablet (10 mg total) by mouth daily.  30 tablet  12  . LORazepam (ATIVAN) 0.5 MG tablet Take 1 tablet (0.5 mg total) by mouth at bedtime as needed for anxiety.  30 tablet  0  . metoprolol succinate (TOPROL XL) 50 MG 24 hr tablet Take 1 tablet (50 mg total) by mouth daily. Take with or immediately following a meal.  30 tablet  12  . prochlorperazine (COMPAZINE) 10 MG tablet Take 1 tablet (10 mg total) by mouth every 6 (six) hours as needed (as directed).  30 tablet  0   Current Facility-Administered Medications  Medication Dose Route Frequency Provider Last Rate Last Dose  . lidocaine-prilocaine (EMLA) cream   Topical Once Lowella Dell, MD        OBJECTIVE: Middle-aged [place] American woman in no acute distress There were no vitals filed for this visit.   There is no height or weight on file to calculate BMI.    ECOG FS: 1  Sclerae unicteric Oropharynx clear No cervical or supraclavicular adenopathy palpated Lungs no rales or rhonchi, good excursion bilaterally Heart regular rate and rhythm Abd soft, nontender, positive bowel sounds, no organomegaly  MSK no focal spinal tenderness Neuro: nonfocal, well oriented,  Breasts:  The right breast continues to have significant involvement of the entire areolar area, but it is now much more pronounced, with increased erythema and some oozing. On the plus side, the erythema that seemed to be tracking across the midline to the left breast is not apparent today. The right breast is firm and hard, and there is fixed adenopathy in the right axilla. The left breast is unremarkable. (The right  breast was again photographed today).  LAB RESULTS: Lab Results  Component Value Date   WBC 8.5 08/11/2012      Chemistry      Component Value Date/Time   NA 140 08/11/2012 1336   NA 142 06/17/2012 1349      Component Value Date/Time   CALCIUM 9.3 08/11/2012 1336   CALCIUM 9.7 06/17/2012 1349       Lab Results  Component Value Date   LABCA2 110* 06/17/2012    No components found with this basename: LABCA125    No results found for this basename: INR:1;PROTIME:1 in the last 168 hours  Urinalysis No results found for this basename: colorurine,  appearanceur,  labspec,  phurine,  glucoseu,  hgbur,  bilirubinur,  ketonesur,  proteinur,  urobilinogen,  nitrite,  leukocytesur    STUDIES: No results found.  ASSESSMENT: 67 y.o. [place] woman s/p Right breast and axillary node biopsy 03/08/2012 for a clinical T4, N1-2', stage  Powell invasive ductal carcinoma, grade 3, triple negative, with an MIB-1 of 93%.  PLAN: I am relieved that Janice Powell has decided to give chemotherapy a try. We are going to a go with cyclophosphamide, docetaxel and doxorubicin given every 3 weeks for 6 cycles, starting January 28. She understands she needs to start dexamethasone the day before treatment, and she was given a "map" of how to take her dexamethasone, prochlorperazine and lorazepam, as well as how to use her EMLA cream. She will see our physicians Asst. Zollie Scale on the 28th to make sure she understands the directions correctly, and we will set her up for her remaining of visits at that time. I would like to see her at the time of her third visit.  Before we can start she needs an echocardiogram which is being scheduled for the next few days, and her port placed, which has been scheduled for January 20. She knows to call for any problems that may develop before the next visit. Alayne Estrella C    08/11/2012

## 2012-08-11 NOTE — Pre-Procedure Instructions (Addendum)
Janice Powell  08/11/2012   Your procedure is scheduled on:  08/15/12  Report to [facility] at 715AM.  Call this number if you have problems the morning of surgery: [phone]   Remember:   Do not eat food or drink liquids after midnight.   Take these medicines the morning of surgery with A SIP OF WATER: pain med, metoprolol   Do not wear jewelry, make-up or nail polish.  Do not wear lotions, powders, or perfumes. You may not wear deodorant.  Do not shave 48 hours prior to surgery. Men may shave face and neck.  Do not bring valuables to the hospital.  Contacts, dentures or bridgework may not be worn into surgery.  Leave suitcase in the car. After surgery it may be brought to your room.  For patients admitted to the hospital, checkout time is 11:00 AM the day of  discharge.   Patients discharged the day of surgery will not be allowed to drive  home.  Name and phone number of your driver: [phone]  Special Instructions: Shower using CHG 2 nights before surgery and the night before surgery.  If you shower the day of surgery use CHG.  Use special wash - you have one bottle of CHG for all showers.  You should use approximately 1/3 of the bottle for each shower.   Please read over the following fact sheets that you were given: Pain Booklet, Coughing and Deep Breathing, MRSA Information and Surgical Site Infection Prevention

## 2012-08-11 NOTE — Telephone Encounter (Signed)
see berry 8:45 1/28; chemo same day (TAC) and repeat every 3 weeks x6; labs w all treatments;  Sent michelle email to set up treatment

## 2012-08-12 ENCOUNTER — Ambulatory Visit (HOSPITAL_COMMUNITY): Payer: Medicare Other

## 2012-08-12 ENCOUNTER — Telehealth: Payer: Self-pay | Admitting: *Deleted

## 2012-08-12 NOTE — Telephone Encounter (Signed)
Gave patient appointment for echo  Emailed michelle to set up treatment

## 2012-08-12 NOTE — H&P (Addendum)
Janice Powell     MRN: [id number]   Description: 67 year old female    Diagnoses     Breast cancer   - Primary    174.9     History and Physical  Angelia Mould. Derrell Lolling, MC,FACS ID: [name]   DOB: 1946/05/24  MR#: [id number]  EXB#:[id number]           HISTORY OF PRESENT ILLNESS:   This patient noticed a right breast mass in 2011.  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  More recently, following encouragement from her husband,  she brought it to Dr. Carolee Rota attention. Diagnostic mammography and right ultrasonography at [facility] 03/02/2012 showed a 6.78 cm irregular mass in the inner aspect of the right breast. There was nipple retraction and erythema around the nipple. A large lymph node was noted in the right axilla. By ultrasound the large irregular hypoechoic mass was noted in the breast with multiple enlarged axillary lymph nodes. Physical exam confirmed a hard breast with erythema around the right nipple extending inferiorly. The nipple is slightly inverted. Palpable axillary nodes were noted. 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 Her subsequent history is  detailed below   She had previously been evaluated by Dr. Chevis Pretty on Aug. 30, 2013 and had declined any surgical intervention or other treatment.   She had previously scheduled and subsequently  cancelled her breast MRI and her PET-CT.  She initially declined chemotherapy,  but has now reconsidered and is willing to have a port placed and to be treated with neoadjuvant chemotherapy.  I was asked to place the port.    REVIEW OF SYSTEMS: She is having significant pain associated with the right breast and right axilla. There is also a "bad odor" from her right breast. She did try to take natural  remedies (particularly minerals) but the pain continued and that has  convinced her  that she needs to give treatment a try.   Dr. Darnelle Catalan plans chemotherapy and requested that [facility] place a port. She is not having severe headaches, visual changes, nausea, vomiting, gait imbalance or dizziness, cough, phlegm production, pleurisy, or any change in bowel or bladder habits. She doesn't have any bony pain or other pain except associated with the right breast the lesion. A detailed review of systems  was otherwise noncontributory   PAST MEDICAL HISTORY: Past Medical History   Diagnosis  Date   .  Hypertension     .  Cancer         right breast   .  Pneumonia         hx   .  GERD (gastroesophageal reflux disease)         occ    GERD osteopenia   PAST SURGICAL HISTORY: Past Surgical History   Procedure  Date   .  Appendectomy  1992   .  Tubal ligation  1979   .  Cholecystectomy  2000      FAMILY HISTORY Family History   Problem  Relation  Age of Onset   .  Heart disease  Mother      the patient's father died in his sleep at age 84. The patient's mother died at the age of 31 from a myocardial infarction. The patient has one brother and 2 sisters, all surviving. There is no  history of breast or ovarian cancer in the family.   GYNECOLOGIC HISTORY: Menarche age 51, first live birth age 55, she is GX P5, menopause age 89. She did not take hormone replacement.   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 Her husband [name] worked in the post office 41 years, and is also now retired. He is a former Arts development officer. Son [name] lives in [place] and manages an [employer] store. Daughter Lesette Frary also lives in [place] and manages the IT Department at [employer]. Son English Tomer III is a Therapist, sports. Daughter [name] is a Doctor, hospital for [employer]. Son [name] is an Teaching laboratory technician. The patient has 8 grandchildren. She attends the white [employer]         HEALTH MAINTENANCE: History   Substance Use  Topics   .  Smoking status:  Never Smoker    .  Smokeless tobacco:  Not on file   .  Alcohol Use:  No       Current Outpatient Prescriptions   Medication  Sig  Dispense  Refill   .  dexamethasone (DECADRON) 4 MG tablet  Take 2 tablets by mouth twice a day as directed   30 tablet   3   .  HYDROcodone-acetaminophen (NORCO/VICODIN) 5-325 MG per tablet  Take 2 tablets by mouth every 6 (six) hours as needed for pain.   120 tablet   0   .  lisinopril (PRINIVIL) 10 MG tablet  Take 1 tablet (10 mg total) by mouth daily.   30 tablet   12   .  LORazepam (ATIVAN) 0.5 MG tablet  Take 1 tablet (0.5 mg total) by mouth at bedtime as needed for anxiety.   30 tablet   0   .  metoprolol succinate (TOPROL XL) 50 MG 24 hr tablet  Take 1 tablet (50 mg total) by mouth daily. Take with or immediately following a meal.   30 tablet   12   .  prochlorperazine (COMPAZINE) 10 MG tablet  Take 1 tablet (10 mg total) by mouth every 6 (six) hours as needed (as directed).   30 tablet   0                EXAM:  : Middle-aged African American woman in no acute distress There were no vitals filed for this visit.   There is no height or weight on file to calculate BMI.   Functional status 1. Sclerae clear No cervical or supraclavicular adenopathy palpated Lungs Clear to auscultation bilaterally Heart: regular rate and rhythm, no ectopy. Abd: soft, nontender, positive bowel sounds, no organomegaly   Neuro: nonfocal, well oriented,   Breasts:  The right breast has significant involvement of the entire areolar area, but it is now much more pronounced, with increased erythema and some oozing. .On the plus side, the erythema that seemed to be tracking across the midline to the left breast is not apparent today. The right breast is firm and hard, and there is Palpable, fixed adenopathy in the right axilla. The left breast is unremarkable.      ASSESSMENT: 67 y.o. [place] woman with neglected, locally advanced cancer right  breast.  Right breast and axillary node biopsy 03/08/2012 reveals  a clinical T4, N1-N2, stage IIIB,  invasive ductal carcinoma, grade 3, triple negative, with an MIB-1 of 93%.    The patient was initially reluctant to have surgical or medical  intervention for her cancer but has now reconsidering.  Hypertension GERD.    PLAN:She has decided to give chemotherapy a try. She was referred by Dr. Darnelle Catalan for port placement.   Angelia Mould. Derrell Lolling, M.D., [facility] General and Minimally invasive Surgery Breast and Colorectal Surgery Office:   [phone] Pager:   [phone]

## 2012-08-14 MED ORDER — CEFAZOLIN SODIUM-DEXTROSE 2-3 GM-% IV SOLR
2.0000 g | INTRAVENOUS | Status: AC
  Administered 2012-08-15: 2 g via INTRAVENOUS
  Filled 2012-08-14: qty 50

## 2012-08-15 ENCOUNTER — Ambulatory Visit (HOSPITAL_COMMUNITY): Payer: Medicare Other

## 2012-08-15 ENCOUNTER — Ambulatory Visit (HOSPITAL_COMMUNITY)
Admission: RE | Admit: 2012-08-15 | Discharge: 2012-08-15 | Disposition: A | Discharge status: Home / Self Care | Payer: Medicare Other | Source: Ambulatory Visit | Attending: General Surgery | Admitting: General Surgery

## 2012-08-15 ENCOUNTER — Ambulatory Visit (HOSPITAL_COMMUNITY): Payer: Medicare Other | Admitting: Certified Registered"

## 2012-08-15 ENCOUNTER — Encounter (HOSPITAL_COMMUNITY)
Admission: RE | Disposition: A | Discharge status: Home / Self Care | Payer: Self-pay | Source: Ambulatory Visit | Attending: General Surgery

## 2012-08-15 ENCOUNTER — Encounter (HOSPITAL_COMMUNITY): Payer: Self-pay | Admitting: Certified Registered"

## 2012-08-15 DIAGNOSIS — I10 Essential (primary) hypertension: Secondary | ICD-10-CM | POA: Diagnosis not present

## 2012-08-15 DIAGNOSIS — C50919 Malignant neoplasm of unspecified site of unspecified female breast: Secondary | ICD-10-CM

## 2012-08-15 DIAGNOSIS — K219 Gastro-esophageal reflux disease without esophagitis: Secondary | ICD-10-CM | POA: Diagnosis not present

## 2012-08-15 DIAGNOSIS — Z01818 Encounter for other preprocedural examination: Secondary | ICD-10-CM | POA: Insufficient documentation

## 2012-08-15 DIAGNOSIS — Z01810 Encounter for preprocedural cardiovascular examination: Secondary | ICD-10-CM | POA: Diagnosis not present

## 2012-08-15 DIAGNOSIS — Z01812 Encounter for preprocedural laboratory examination: Secondary | ICD-10-CM | POA: Insufficient documentation

## 2012-08-15 DIAGNOSIS — Z79899 Other long term (current) drug therapy: Secondary | ICD-10-CM | POA: Insufficient documentation

## 2012-08-15 DIAGNOSIS — J9819 Other pulmonary collapse: Secondary | ICD-10-CM | POA: Diagnosis not present

## 2012-08-15 HISTORY — PX: PORTACATH PLACEMENT: SHX2246

## 2012-08-15 SURGERY — INSERTION, TUNNELED CENTRAL VENOUS DEVICE, WITH PORT
Anesthesia: General | Site: Chest | Laterality: Left | Wound class: Clean

## 2012-08-15 MED ORDER — HEPARIN SOD (PORK) LOCK FLUSH 100 UNIT/ML IV SOLN
INTRAVENOUS | Status: AC
  Filled 2012-08-15: qty 5

## 2012-08-15 MED ORDER — PROMETHAZINE HCL 25 MG/ML IJ SOLN: 6.2500 mg | INTRAMUSCULAR | Status: DC | PRN

## 2012-08-15 MED ORDER — HYDRALAZINE HCL 20 MG/ML IJ SOLN
5.0000 mg | Freq: Two times a day (BID) | INTRAMUSCULAR | Status: DC | PRN
  Administered 2012-08-15: 5 mg via INTRAVENOUS

## 2012-08-15 MED ORDER — HEPARIN SOD (PORK) LOCK FLUSH 100 UNIT/ML IV SOLN
INTRAVENOUS | Status: DC | PRN
  Administered 2012-08-15: 300 [IU] via INTRAVENOUS

## 2012-08-15 MED ORDER — HYDROCODONE-ACETAMINOPHEN 5-325 MG PO TABS: 1.0000 | ORAL_TABLET | ORAL | Status: DC | PRN

## 2012-08-15 MED ORDER — LACTATED RINGERS IV SOLN
INTRAVENOUS | Status: DC
  Administered 2012-08-15: 09:00:00 via INTRAVENOUS

## 2012-08-15 MED ORDER — HYDRALAZINE HCL 20 MG/ML IJ SOLN
INTRAMUSCULAR | Status: AC
  Filled 2012-08-15: qty 1

## 2012-08-15 MED ORDER — CHLORHEXIDINE GLUCONATE 4 % EX LIQD: 1.0000 "application " | Freq: Once | CUTANEOUS | Status: DC

## 2012-08-15 MED ORDER — OXYCODONE HCL 5 MG PO TABS: 5.0000 mg | ORAL_TABLET | Freq: Once | ORAL | Status: DC | PRN

## 2012-08-15 MED ORDER — MIDAZOLAM HCL 5 MG/5ML IJ SOLN
INTRAMUSCULAR | Status: DC | PRN
  Administered 2012-08-15: 2 mg via INTRAVENOUS

## 2012-08-15 MED ORDER — BUPIVACAINE-EPINEPHRINE PF 0.25-1:200000 % IJ SOLN
INTRAMUSCULAR | Status: AC
  Filled 2012-08-15: qty 30

## 2012-08-15 MED ORDER — OXYCODONE HCL 5 MG/5ML PO SOLN: 5.0000 mg | Freq: Once | ORAL | Status: DC | PRN

## 2012-08-15 MED ORDER — FENTANYL CITRATE 0.05 MG/ML IJ SOLN
INTRAMUSCULAR | Status: DC | PRN
  Administered 2012-08-15: 50 ug via INTRAVENOUS
  Administered 2012-08-15: 25 ug via INTRAVENOUS

## 2012-08-15 MED ORDER — BUPIVACAINE HCL 0.25 % IJ SOLN
INTRAMUSCULAR | Status: DC | PRN
  Administered 2012-08-15: 9 mL

## 2012-08-15 MED ORDER — LIDOCAINE HCL (CARDIAC) 20 MG/ML IV SOLN
INTRAVENOUS | Status: DC | PRN
  Administered 2012-08-15: 90 mg via INTRAVENOUS

## 2012-08-15 MED ORDER — HYDROMORPHONE HCL PF 1 MG/ML IJ SOLN: 0.2500 mg | INTRAMUSCULAR | Status: DC | PRN

## 2012-08-15 MED ORDER — PROPOFOL 10 MG/ML IV BOLUS
INTRAVENOUS | Status: DC | PRN
  Administered 2012-08-15: 150 mg via INTRAVENOUS

## 2012-08-15 MED ORDER — SODIUM CHLORIDE 0.9 % IR SOLN
Status: DC | PRN
  Administered 2012-08-15: 10:00:00

## 2012-08-15 MED ORDER — LACTATED RINGERS IV SOLN
INTRAVENOUS | Status: DC | PRN
  Administered 2012-08-15: 09:00:00 via INTRAVENOUS

## 2012-08-15 MED ORDER — ONDANSETRON HCL 4 MG/2ML IJ SOLN
INTRAMUSCULAR | Status: DC | PRN
  Administered 2012-08-15: 4 mg via INTRAVENOUS

## 2012-08-15 SURGICAL SUPPLY — 59 items
ADH SKN CLS APL DERMABOND .7 (GAUZE/BANDAGES/DRESSINGS) ×1
BAG DECANTER FOR FLEXI CONT (MISCELLANEOUS) ×2 IMPLANT
BLADE SURG 10 STRL SS (BLADE) ×2 IMPLANT
BLADE SURG 15 STRL LF DISP TIS (BLADE) ×1 IMPLANT
BLADE SURG 15 STRL SS (BLADE) ×2
BLADE SURG ROTATE 9660 (MISCELLANEOUS) IMPLANT
CANISTER SUCTION 2500CC (MISCELLANEOUS) IMPLANT
CHLORAPREP W/TINT 10.5 ML (MISCELLANEOUS) ×2 IMPLANT
CLOTH BEACON ORANGE TIMEOUT ST (SAFETY) ×2 IMPLANT
COVER PROBE W GEL 5X96 (DRAPES) IMPLANT
COVER SURGICAL LIGHT HANDLE (MISCELLANEOUS) ×2 IMPLANT
CRADLE DONUT ADULT HEAD (MISCELLANEOUS) ×2 IMPLANT
DECANTER SPIKE VIAL GLASS SM (MISCELLANEOUS) ×2 IMPLANT
DERMABOND ADVANCED (GAUZE/BANDAGES/DRESSINGS) ×1
DERMABOND ADVANCED .7 DNX12 (GAUZE/BANDAGES/DRESSINGS) ×1 IMPLANT
DRAPE C-ARM 42X72 X-RAY (DRAPES) ×2 IMPLANT
DRAPE LAPAROSCOPIC ABDOMINAL (DRAPES) ×2 IMPLANT
DRAPE UTILITY 15X26 W/TAPE STR (DRAPE) ×4 IMPLANT
ELECT CAUTERY BLADE 6.4 (BLADE) ×2 IMPLANT
ELECT REM PT RETURN 9FT ADLT (ELECTROSURGICAL) ×2
ELECTRODE REM PT RTRN 9FT ADLT (ELECTROSURGICAL) ×1 IMPLANT
GAUZE SPONGE 4X4 16PLY XRAY LF (GAUZE/BANDAGES/DRESSINGS) ×2 IMPLANT
GLOVE BIOGEL PI IND STRL 7.0 (GLOVE) IMPLANT
GLOVE BIOGEL PI INDICATOR 7.0 (GLOVE) ×1
GLOVE EUDERMIC 7 POWDERFREE (GLOVE) ×2 IMPLANT
GLOVE SURG SS PI 6.0 STRL IVOR (GLOVE) ×1 IMPLANT
GLOVE SURG SS PI 6.5 STRL IVOR (GLOVE) ×1 IMPLANT
GOWN PREVENTION PLUS XLARGE (GOWN DISPOSABLE) ×2 IMPLANT
GOWN STRL NON-REIN LRG LVL3 (GOWN DISPOSABLE) ×3 IMPLANT
INTRODUCER 13FR (MISCELLANEOUS) IMPLANT
INTRODUCER COOK 11FR (CATHETERS) IMPLANT
KIT BASIN OR (CUSTOM PROCEDURE TRAY) ×2 IMPLANT
KIT PORT POWER 8FR ISP CVUE (Catheter) ×1 IMPLANT
KIT PORT POWER 9.6FR MRI PREA (Catheter) IMPLANT
KIT PORT POWER ISP 8FR (Catheter) IMPLANT
KIT POWER CATH 8FR (Catheter) IMPLANT
KIT ROOM TURNOVER OR (KITS) ×2 IMPLANT
NDL HYPO 25GX1X1/2 BEV (NEEDLE) ×1 IMPLANT
NEEDLE HYPO 25GX1X1/2 BEV (NEEDLE) ×2 IMPLANT
NS IRRIG 1000ML POUR BTL (IV SOLUTION) ×2 IMPLANT
PACK SURGICAL SETUP 50X90 (CUSTOM PROCEDURE TRAY) ×2 IMPLANT
PAD ARMBOARD 7.5X6 YLW CONV (MISCELLANEOUS) ×2 IMPLANT
PENCIL BUTTON HOLSTER BLD 10FT (ELECTRODE) ×2 IMPLANT
SET INTRODUCER 12FR PACEMAKER (SHEATH) IMPLANT
SET SHEATH INTRODUCER 10FR (MISCELLANEOUS) IMPLANT
SHEATH COOK PEEL AWAY SET 9F (SHEATH) IMPLANT
SURGILUBE 3G PEEL PACK STRL (MISCELLANEOUS) IMPLANT
SUT MNCRL AB 4-0 PS2 18 (SUTURE) ×2 IMPLANT
SUT PROLENE 2 0 CT2 30 (SUTURE) ×2 IMPLANT
SUT VIC AB 3-0 SH 18 (SUTURE) ×2 IMPLANT
SYR 20ML ECCENTRIC (SYRINGE) ×4 IMPLANT
SYR 5ML LUER SLIP (SYRINGE) ×2 IMPLANT
SYR BULB 3OZ (MISCELLANEOUS) ×2 IMPLANT
SYR CONTROL 10ML LL (SYRINGE) ×2 IMPLANT
SYRINGE 10CC LL (SYRINGE) ×2 IMPLANT
TOWEL OR 17X24 6PK STRL BLUE (TOWEL DISPOSABLE) ×2 IMPLANT
TOWEL OR 17X26 10 PK STRL BLUE (TOWEL DISPOSABLE) ×2 IMPLANT
TUBE CONNECTING 12X1/4 (SUCTIONS) IMPLANT
YANKAUER SUCT BULB TIP NO VENT (SUCTIONS) IMPLANT

## 2012-08-15 NOTE — Transfer of Care (Signed)
Immediate Anesthesia Transfer of Care Note  Patient: Janice Powell  Procedure(s) Performed: Procedure(s) (LRB) with comments: INSERTION PORT-A-CATH (Left)  Patient Location: PACU  Anesthesia Type:General  Level of Consciousness: awake, alert  and oriented  Airway & Oxygen Therapy: Patient Spontanous Breathing and Patient connected to nasal cannula oxygen  Post-op Assessment: Report given to PACU RN, Post -op Vital signs reviewed and stable and Patient moving all extremities  Post vital signs: Reviewed and stable  Complications: No apparent anesthesia complications

## 2012-08-15 NOTE — Op Note (Signed)
Patient Name:           Janice Powell   Date of Surgery:        08/15/2012  Pre op Diagnosis:      Invasive cancer right breast, triple negative,  locally advanced, clinical stage T4, N1-N2, stage IIIB.  Post op Diagnosis:    Same  Procedure:                 Insertion of 8 French ClearVUE power port venous access device with fluoroscopic guidance and positioning.  Surgeon:                     Angelia Mould. Derrell Lolling, M.D., FACS  Assistant:                      None  Operative Indications:   This patient noticed a right breast mass in 2011. 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 More recently, she brought it to Dr. Carolee Rota attention. Diagnostic mammography and right ultrasonography at [facility] 03/02/2012 showed a 6.7 cm irregular mass in the inner aspect of the right breast. There was nipple retraction and erythema around the nipple. A large lymph node was noted in the right axilla. By ultrasound the large irregular hypoechoic mass was noted in the breast with multiple enlarged axillary lymph nodes. Physical exam confirmed a hard breast with erythema around the right nipple extending inferiorly. The nipple is slightly inverted. Palpable axillary nodes were noted.  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 Her subsequent history is detailed below  She had previously been evaluated by Dr. Chevis Pretty on Aug. 30, 2013 and had declined any surgical intervention or other treatment.  She has declined breast MRI and her PET-CT to date.  She initially declined chemotherapy, but has now reconsidered and is willing to have a port placed and to be treated with neoadjuvant chemotherapy. I was asked to place the port.   Operative Findings:       The right breast cancer was noted to fill the right breast. The entire nipple and areola are destroyed by invasion of the cancer onto the  skin. The port was inserted into the left subclavian vein without difficulty, and the intraoperative fluoroscopy showed good positioning of the catheter tip in the superior vena cava.  Procedure in Detail:          Following the induction of general LMA anesthesia, the patient was positioned with a small roll under her shoulders and her arms at her sides. The neck and chest were prepped and draped in a sterile fashion. Intravenous antibiotics were given. Surgical time out was performed. 0.25% Marcaine with epinephrine was used as a local infiltration anesthetic. A left subclavian venipuncture was performed with a single pass, and a guidewire inserted into the superior vena cava. Fluoroscopy was utilized to confirm position of the wire and also to draw a template on the chest wall for positioning of the catheter so the tip would be in the superior vena cava near the right atrium.  A transverse incision was made approximately 2-1/2 cm below the left clavicle. A little bit of subcutaneous tissue was debrided and a pocket was created at the level of the pectoralis fascia. Using a tunneling device I drew the catheter from the port pocket site to the wire insertion  site. The catheter was measured and positioned using the template guide previously drawn and I cut the catheter about 21 cm in length. The catheter was then secured to the port with the locking device and the port and catheter flushed with heparinized saline. The port was sutured to the pectoralis fascia with 3 interrupted sutures of 2-0 Prolene. The dilator and peel-away sheath were inserted over the guidewire into the central venous circulation, and the dilator and guidewire removed. The catheter was threaded into the peel-away sheath and the peel-away sheath removed. The catheter flushed easily and had excellent blood return. Fluoroscopy confirmed the tip of the catheter was in the superior vena cava near the right atrium as planned, and there is no  deformity of the catheter anywhere along its length. The port and catheter were then flushed with concentrated heparin solution. Subcutaneous tissue was closed with 3-0 Vicryl sutures and skin incisions closed with subcuticular sutures of 4-0 Monocryl and Dermabond. The patient tolerated the procedure well and was taken to recovery room stable. Counts correct. EBL 10 cc. A chest x-ray is planned in PACU.     Angelia Mould. Derrell Lolling, M.D., FACS General and Minimally Invasive Surgery Breast and Colorectal Surgery  08/15/2012 9:42 AM

## 2012-08-15 NOTE — Anesthesia Preprocedure Evaluation (Signed)
Anesthesia Evaluation  Patient identified by MRN, date of birth, ID band Patient awake    Reviewed: Allergy & Precautions, H&P , NPO status , Patient's Chart, lab work & pertinent test results, reviewed documented beta blocker date and time   History of Anesthesia Complications Negative for: history of anesthetic complications  Airway Mallampati: I TM Distance: >3 FB Neck ROM: Full    Dental  (+) Edentulous Upper and Dental Advisory Given   Pulmonary neg pulmonary ROS,    Pulmonary exam normal       Cardiovascular hypertension, Pt. on home beta blockers     Neuro/Psych negative neurological ROS     GI/Hepatic Neg liver ROS, GERD-  ,  Endo/Other  negative endocrine ROS  Renal/GU negative Renal ROS     Musculoskeletal   Abdominal   Peds  Hematology negative hematology ROS (+)   Anesthesia Other Findings   Reproductive/Obstetrics                           Anesthesia Physical Anesthesia Plan  ASA: II  Anesthesia Plan: General   Post-op Pain Management:    Induction: Intravenous  Airway Management Planned: LMA  Additional Equipment:   Intra-op Plan:   Post-operative Plan: Extubation in OR  Informed Consent: I have reviewed the patients History and Physical, chart, labs and discussed the procedure including the risks, benefits and alternatives for the proposed anesthesia with the patient or authorized representative who has indicated his/her understanding and acceptance.   Dental advisory given  Plan Discussed with: CRNA, Anesthesiologist and Surgeon  Anesthesia Plan Comments:         Anesthesia Quick Evaluation

## 2012-08-15 NOTE — Preoperative (Signed)
Beta Blockers   Reason not to administer Beta Blockers:taken this am @0600 

## 2012-08-15 NOTE — Anesthesia Postprocedure Evaluation (Signed)
Anesthesia Post Note  Patient: Janice Powell  Procedure(s) Performed: Procedure(s) (LRB): INSERTION PORT-A-CATH (Left)  Anesthesia type: general  Patient location: PACU  Post pain: Pain level controlled  Post assessment: Patient's Cardiovascular Status Stable  Last Vitals:  Filed Vitals:   08/15/12 1000  BP: 184/73  Pulse: 59  Temp:   Resp: 20    Post vital signs: Reviewed and stable  Level of consciousness: sedated  Complications: No apparent anesthesia complications

## 2012-08-15 NOTE — Interval H&P Note (Signed)
History and Physical Interval Note:  08/15/2012 8:44 AM  Janice Powell  has presented today for surgery, with the diagnosis of breast cancer  She has requested that I place her port a cath.  The goals and the various methods of treatment and the numerous risks have been discussed with the patient. After consideration of risks, benefits and other options for treatment, the patient has consented to  Procedure(s) (LRB) with comments: INSERTION PORT-A-CATH (N/A) as a surgical intervention .  The patient's history has been reviewed, patient examined today, no change in status, stable for surgery.  I have reviewed the patient's chart and labs.  Questions were answered to the patient's satisfaction.     Angelia Mould. Derrell Lolling, M.D., [facility] General and Minimally invasive Surgery Breast and Colorectal Surgery Office:   [phone] Pager:   [phone]

## 2012-08-16 ENCOUNTER — Ambulatory Visit (HOSPITAL_COMMUNITY): Payer: Medicare Other

## 2012-08-16 ENCOUNTER — Telehealth (INDEPENDENT_AMBULATORY_CARE_PROVIDER_SITE_OTHER): Payer: Self-pay | Admitting: General Surgery

## 2012-08-16 NOTE — Telephone Encounter (Signed)
Called patient at home to advise of post op appointment for port-a-cath insertion. Patient scheduled to see Dr. Derrell Lolling on 10/18/12 at 3:30.

## 2012-08-17 ENCOUNTER — Encounter (HOSPITAL_COMMUNITY): Payer: Self-pay | Admitting: General Surgery

## 2012-08-17 ENCOUNTER — Other Ambulatory Visit: Payer: Medicare Other | Admitting: Lab

## 2012-08-19 ENCOUNTER — Ambulatory Visit (HOSPITAL_COMMUNITY)
Admission: RE | Admit: 2012-08-19 | Discharge: 2012-08-19 | Disposition: A | Discharge status: Home / Self Care | Payer: Medicare Other | Source: Ambulatory Visit | Attending: Oncology | Admitting: Oncology

## 2012-08-19 DIAGNOSIS — M899 Disorder of bone, unspecified: Secondary | ICD-10-CM | POA: Insufficient documentation

## 2012-08-19 DIAGNOSIS — K219 Gastro-esophageal reflux disease without esophagitis: Secondary | ICD-10-CM | POA: Diagnosis not present

## 2012-08-19 DIAGNOSIS — Z01818 Encounter for other preprocedural examination: Secondary | ICD-10-CM | POA: Diagnosis not present

## 2012-08-19 DIAGNOSIS — C50919 Malignant neoplasm of unspecified site of unspecified female breast: Secondary | ICD-10-CM | POA: Diagnosis not present

## 2012-08-19 DIAGNOSIS — M949 Disorder of cartilage, unspecified: Secondary | ICD-10-CM | POA: Insufficient documentation

## 2012-08-19 DIAGNOSIS — I10 Essential (primary) hypertension: Secondary | ICD-10-CM | POA: Diagnosis not present

## 2012-08-19 DIAGNOSIS — D4989 Neoplasm of unspecified behavior of other specified sites: Secondary | ICD-10-CM

## 2012-08-23 ENCOUNTER — Other Ambulatory Visit (HOSPITAL_BASED_OUTPATIENT_CLINIC_OR_DEPARTMENT_OTHER): Payer: Medicare Other | Admitting: Lab

## 2012-08-23 ENCOUNTER — Other Ambulatory Visit: Payer: Self-pay | Admitting: Oncology

## 2012-08-23 ENCOUNTER — Encounter: Payer: Self-pay | Admitting: Physician Assistant

## 2012-08-23 ENCOUNTER — Ambulatory Visit (HOSPITAL_BASED_OUTPATIENT_CLINIC_OR_DEPARTMENT_OTHER): Payer: Medicare Other | Admitting: Physician Assistant

## 2012-08-23 ENCOUNTER — Telehealth: Payer: Self-pay | Admitting: Oncology

## 2012-08-23 ENCOUNTER — Ambulatory Visit (HOSPITAL_BASED_OUTPATIENT_CLINIC_OR_DEPARTMENT_OTHER): Payer: Medicare Other

## 2012-08-23 VITALS — BP 168/71 | HR 66 | Temp 98.9°F | Resp 20

## 2012-08-23 VITALS — BP 219/86 | HR 86 | Temp 99.1°F | Resp 20 | Ht 63.0 in | Wt 139.8 lb

## 2012-08-23 DIAGNOSIS — C50919 Malignant neoplasm of unspecified site of unspecified female breast: Secondary | ICD-10-CM

## 2012-08-23 DIAGNOSIS — C50319 Malignant neoplasm of lower-inner quadrant of unspecified female breast: Secondary | ICD-10-CM

## 2012-08-23 DIAGNOSIS — C773 Secondary and unspecified malignant neoplasm of axilla and upper limb lymph nodes: Secondary | ICD-10-CM

## 2012-08-23 DIAGNOSIS — I10 Essential (primary) hypertension: Secondary | ICD-10-CM

## 2012-08-23 DIAGNOSIS — Z171 Estrogen receptor negative status [ER-]: Secondary | ICD-10-CM

## 2012-08-23 DIAGNOSIS — C801 Malignant (primary) neoplasm, unspecified: Secondary | ICD-10-CM | POA: Insufficient documentation

## 2012-08-23 DIAGNOSIS — Z5111 Encounter for antineoplastic chemotherapy: Secondary | ICD-10-CM | POA: Diagnosis not present

## 2012-08-23 LAB — COMPREHENSIVE METABOLIC PANEL (CC13)
ALT: 17 U/L (ref 0–55)
AST: 14 U/L (ref 5–34)
Albumin: 3.7 g/dL (ref 3.5–5.0)
Alkaline Phosphatase: 53 U/L (ref 40–150)
Calcium: 9.5 mg/dL (ref 8.4–10.4)
Chloride: 104 mEq/L (ref 98–107)
Potassium: 3.1 mEq/L — ABNORMAL LOW (ref 3.5–5.1)
Sodium: 140 mEq/L (ref 136–145)

## 2012-08-23 LAB — CBC WITH DIFFERENTIAL/PLATELET
BASO%: 0.1 % (ref 0.0–2.0)
EOS%: 0 % (ref 0.0–7.0)
HCT: 42.4 % (ref 34.8–46.6)
MCH: 30.9 pg (ref 25.1–34.0)
MCHC: 33 g/dL (ref 31.5–36.0)
MONO#: 1.4 10*3/uL — ABNORMAL HIGH (ref 0.1–0.9)
RBC: 4.53 10*6/uL (ref 3.70–5.45)
RDW: 14.2 % (ref 11.2–14.5)
WBC: 12.4 10*3/uL — ABNORMAL HIGH (ref 3.9–10.3)
lymph#: 2.8 10*3/uL (ref 0.9–3.3)
nRBC: 0 % (ref 0–0)

## 2012-08-23 MED ORDER — SODIUM CHLORIDE 0.9 % IV SOLN
500.0000 mg/m2 | Freq: Once | INTRAVENOUS | Status: AC
  Administered 2012-08-23: 840 mg via INTRAVENOUS
  Filled 2012-08-23: qty 42

## 2012-08-23 MED ORDER — DOCETAXEL CHEMO INJECTION 160 MG/16ML
75.0000 mg/m2 | Freq: Once | INTRAVENOUS | Status: AC
  Administered 2012-08-23: 130 mg via INTRAVENOUS
  Filled 2012-08-23: qty 13

## 2012-08-23 MED ORDER — SODIUM CHLORIDE 0.9 % IV SOLN
Freq: Once | INTRAVENOUS | Status: AC
  Administered 2012-08-23: 10:00:00 via INTRAVENOUS

## 2012-08-23 MED ORDER — CLONIDINE HCL 0.1 MG PO TABS: 0.1000 mg | ORAL_TABLET | Freq: Once | ORAL | Status: DC

## 2012-08-23 MED ORDER — DOXORUBICIN HCL CHEMO IV INJECTION 2 MG/ML
50.0000 mg/m2 | Freq: Once | INTRAVENOUS | Status: AC
  Administered 2012-08-23: 84 mg via INTRAVENOUS
  Filled 2012-08-23: qty 42

## 2012-08-23 MED ORDER — PALONOSETRON HCL INJECTION 0.25 MG/5ML
0.2500 mg | Freq: Once | INTRAVENOUS | Status: AC
  Administered 2012-08-23: 0.25 mg via INTRAVENOUS

## 2012-08-23 MED ORDER — DEXAMETHASONE SODIUM PHOSPHATE 4 MG/ML IJ SOLN
12.0000 mg | Freq: Once | INTRAMUSCULAR | Status: AC
  Administered 2012-08-23: 12 mg via INTRAVENOUS

## 2012-08-23 MED ORDER — SODIUM CHLORIDE 0.9 % IV SOLN
150.0000 mg | Freq: Once | INTRAVENOUS | Status: AC
  Administered 2012-08-23: 150 mg via INTRAVENOUS
  Filled 2012-08-23: qty 5

## 2012-08-23 NOTE — Telephone Encounter (Signed)
gv pt appt schedule for January thru March.  °

## 2012-08-23 NOTE — Progress Notes (Signed)
ID: Janice Powell   DOB: Nov 26, 1945  MR#: [id number]  CSN#:[id number]  PCP: Londell Moh, MD GYN:  SUClaud Kelp MD OTHER MD:   HISTORY OF PRESENT ILLNESS: Janice Powell noted a mass in her right breast sometime in 2011. 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 More recently her husband twisted her arm to get the mass looked at, and Janice Powell brought it to Dr. Carolee Rota attention. Diagnostic mammography and right ultrasonography at [facility] 03/02/2012 showed a 6.78 cm irregular mass in the inner aspect of the right breast. There was nipple retraction and erythema around the nipple. The largest lymph node was noted in the right axilla. By ultrasound the large irregular hypoechoic mass was noted in the breast with multiple enlarged axillary lymph nodes. Physical exam confirmed a hard breast with erythema around the right nipple extending inferiorly. The nipple is slightly inverted.  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 Her subsequent history is as detailed below  INTERVAL HISTORY: Janice Powell returns today accompanied by her husband [name] for further discussion of her locally advanced right breast cancer. Janice Powell is ready to initiate neoadjuvant chemotherapy today, consisting of docetaxel/doxorubicin/cyclophosphamide to be given every 3 weeks. Today is day 1 cycle 1 of 6.  The patient had her port placed last week on January 20 with no complications. The area is healing well. Janice Powell also had an echocardiogram last week which showed a good ejection fraction of 60-65%. Janice Powell has all of her anti-emetics at home, and start her oral dexamethasone yesterday as instructed.  The biggest concern is the fact that her blood pressure continues to run very high. Janice Powell took her metoprolol and lisinopril this morning, and here in the office today her blood pressure is still elevated at 220/105. Janice Powell  denies any chest pain or palpitations has had no abnormal headaches except for shortly after taking the dexamethasone yesterday.   REVIEW OF SYSTEMS: Janice Powell denies any fevers or chills. Janice Powell's had no rashes or skin changes. Janice Powell continues to have significant pain associated with the right breast and right axilla, for which Janice Powell is taking hydrocodone/APAP as prescribed by Dr. Derrell Lolling. There is also a "bad odor" from her right breast. Janice Powell's eating and drinking well with no nausea or emesis. Janice Powell tends to be slightly constipated but manages to have regular bowel movements with the use of MiraLAX. Janice Powell's had no cough, phlegm production, pleurisy, or increased shortness of breath. Janice Powell denies any myalgias, arthralgias, or bony pain. Janice Powell's had no peripheral swelling, and a baseline denies any peripheral neuropathy.  A detailed review of systems is otherwise stable and noncontributory.    PAST MEDICAL HISTORY: Past Medical History  Diagnosis Date  . Hypertension   . Cancer     right breast  . Pneumonia     hx  . GERD (gastroesophageal reflux disease)     occ  GERD osteopenia  PAST SURGICAL HISTORY: Past Surgical History  Procedure Date  . Appendectomy 1992  . Tubal ligation 1979  . Cholecystectomy 2000  . Portacath placement 08/15/2012    Procedure: INSERTION PORT-A-CATH;  Surgeon: Ernestene Mention, MD;  Location: [facility] OR;  Service: General;  Laterality: Left;    FAMILY HISTORY Family History  Problem Relation Age of Onset  . Heart disease Mother    the patient's father died in his sleep at age 45. The patient's mother died at  the age of 59 from a myocardial infarction. The patient has one brother and 2 sisters, all surviving. There is no history of breast or ovarian cancer in the family.  GYNECOLOGIC HISTORY: Menarche age 60, first live birth age 25, Janice Powell is GX P5, menopause age 41. Janice Powell did not take hormone replacement.  SOCIAL HISTORY: Janice Powell, Janice Powell. Janice Powell retired  earlier this year. 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 Her husband [name] worked in the post office 41 years, and is also now retired. He is a former Arts development officer. Son [name] lives in [place] and manages an [employer] store. Daughter Ria Redcay also lives in [place] and manages the IT Department at [employer]. Son Armilda Vanderlinden III is a Therapist, sports. Daughter [name] is a Doctor, hospital for [employer]. Son [name] is an Teaching laboratory technician. The patient has 8 grandchildren. Janice Powell attends the white [employer]   ADVANCED DIRECTIVES: Not in place  HEALTH MAINTENANCE: History  Substance Use Topics  . Smoking status: Never Smoker   . Smokeless tobacco: Not on file  . Alcohol Use: No     Colonoscopy:  PAP:  Bone density:  Lipid panel:  No Known Allergies  Current Outpatient Prescriptions  Medication Sig Dispense Refill  . dexamethasone (DECADRON) 4 MG tablet Take 2 tablets by mouth twice a day as directed  30 tablet  3  . HYDROcodone-acetaminophen (NORCO/VICODIN) 5-325 MG per tablet Take 1-2 tablets by mouth every 4 (four) hours as needed for pain.  50 tablet  1  . lidocaine-prilocaine (EMLA) cream       . lisinopril (PRINIVIL) 10 MG tablet Take 1 tablet (10 mg total) by mouth daily.  30 tablet  12  . LORazepam (ATIVAN) 0.5 MG tablet Take 1 tablet (0.5 mg total) by mouth at bedtime as needed for anxiety.  30 tablet  0  . metoprolol succinate (TOPROL XL) 50 MG 24 hr tablet Take 1 tablet (50 mg total) by mouth daily. Take with or immediately following a meal.  30 tablet  12  . prochlorperazine (COMPAZINE) 10 MG tablet Take 1 tablet (10 mg total) by mouth every 6 (six) hours as needed (as directed).  30 tablet  0   Current Facility-Administered Medications  Medication Dose Route Frequency Provider Last Rate Last Dose  . cloNIDine (CATAPRES) tablet 0.1 mg  0.1 mg Oral Once Catalina Gravel, PA        OBJECTIVE: Middle-aged [place] American Powell in no acute distress Filed Vitals:   08/23/12 0855  BP: 219/86    Pulse:   Temp:   Resp:      Body mass index is 24.76 kg/(m^2).    ECOG FS: 1 Filed Weights   08/23/12 0843  Weight: 139 lb 12.8 oz (63.413 kg)   Sclerae unicteric Oropharynx clear No cervical or supraclavicular adenopathy palpated Lungs no rales or rhonchi, good excursion bilaterally Heart regular rate and rhythm Abdomen  soft, nontender, positive bowel sounds MSK no focal spinal tenderness Neuro: nonfocal, well oriented,  Breasts:  Deferred   LAB RESULTS: Lab Results  Component Value Date   WBC 12.4* 08/23/2012      Chemistry      Component Value Date/Time   NA 140 08/11/2012 1336   NA 142 06/17/2012 1349      Component Value Date/Time   CALCIUM 9.3 08/11/2012 1336   CALCIUM 9.7 06/17/2012 1349       Lab Results  Component Value Date   LABCA2 110* 06/17/2012  STUDIES:  Echocardiogram on 08/19/2012 showed an ejection fraction of 60-65%.     Dg Chest Port 1 View  08/15/2012  *RADIOLOGY REPORT*  Clinical Data: Postop Port-A-Cath placement.  PORTABLE CHEST - 1 VIEW  Comparison: 08/11/2012.  Findings: Trachea is midline.  Left subclavian Port-A-Cath tip projects over the SVC.  Heart size stable.  Minimal left lower lobe subsegmental atelectasis.  No pleural fluid.  No pneumothorax.  IMPRESSION:  1.  Left subclavian Port-A-Cath with without complicating feature. 2.  Left lower lobe subsegmental atelectasis.   Original Report Authenticated By: Leanna Battles, M.D.    Dg Fluoro Guide Cv Line-no Report  08/15/2012  CLINICAL DATA: portacath insertion   FLOURO GUIDE CV LINE  Fluoroscopy was utilized by the requesting physician.  No radiographic  interpretation.       ASSESSMENT: 67 y.o. Janice Powell   (1)  s/p Right breast and axillary node biopsy 03/08/2012 for a clinical T4, N1-2', stage III invasive ductal carcinoma, grade 3, triple negative, with an MIB-1 of 93%.  (2)  being treated in the neoadjuvant setting, the goal being to complete 6 cycles of  docetaxel/doxorubicin/cyclophosphamide given every 3 weeks with Neulasta on day 2 for granulocyte support  (3) hypertension, poorly controlled  PLAN: Shemeika will proceed to chemotherapy today as scheduled for her first of 6 planned q. three-week doses of docetaxel/doxorubicin/cyclophosphamide given in the neoadjuvant setting. Janice Powell received Neulasta on day 2, and I will see her again next week in [place] for for assessment of chemotoxicity. We reviewed all of her antiemetic regimen today which will include dexamethasone (Janice Powell began that medication yesterday), prochlorperazine, and lorazepam. Janice Powell also knows to utilize Claritin for several days following the Neulasta injection to decrease pain, and Janice Powell also has a prescription for Vicodin from Dr. Derrell Lolling.  Janice Powell was given all of this information in writing today.  We'll keep a very close eye on her blood pressure. It was very elevated today, and we have given her Catapres, 0.1 mg x1 prior to chemotherapy. If her blood pressure is still high next week, we may need to reconsider her hypertensive therapy, and perhaps refer her back to her primary care physician, Dr. Renne Crigler, for further evaluation.  Christyanna voices understanding and agreement with our plan, noticed called any changes or problems.    Hazael Olveda    08/23/2012

## 2012-08-23 NOTE — Patient Instructions (Signed)
[facility] Discharge Instructions for Patients Receiving Chemotherapy  Today you received the following chemotherapy agents: Docetaxel, Adriamycin, Cytoxan.  To help prevent nausea and vomiting after your treatment, we encourage you to take your nausea medication.    If you develop nausea and vomiting that is not controlled by your nausea medication, call the clinic.   BELOW ARE SYMPTOMS THAT SHOULD BE REPORTED IMMEDIATELY:  *FEVER GREATER THAN 100.5 F  *CHILLS WITH OR WITHOUT FEVER  NAUSEA AND VOMITING THAT IS NOT CONTROLLED WITH YOUR NAUSEA MEDICATION  *UNUSUAL SHORTNESS OF BREATH  *UNUSUAL BRUISING OR BLEEDING  TENDERNESS IN MOUTH AND THROAT WITH OR WITHOUT PRESENCE OF ULCERS  *URINARY PROBLEMS  *BOWEL PROBLEMS  UNUSUAL RASH Items with * indicate a potential emergency and should be followed up as soon as possible.  One of the nurses will contact you 24 hours after your treatment. Please let the nurse know about any problems that you may have experienced. Feel free to call the clinic you have any questions or concerns. The clinic phone number is [phone].

## 2012-08-24 ENCOUNTER — Ambulatory Visit: Payer: Medicare Other | Admitting: Oncology

## 2012-08-24 ENCOUNTER — Ambulatory Visit (HOSPITAL_BASED_OUTPATIENT_CLINIC_OR_DEPARTMENT_OTHER): Payer: Medicare Other

## 2012-08-24 ENCOUNTER — Telehealth: Payer: Self-pay | Admitting: *Deleted

## 2012-08-24 VITALS — BP 196/84 | HR 65 | Temp 98.2°F

## 2012-08-24 DIAGNOSIS — C773 Secondary and unspecified malignant neoplasm of axilla and upper limb lymph nodes: Secondary | ICD-10-CM | POA: Diagnosis not present

## 2012-08-24 DIAGNOSIS — C50919 Malignant neoplasm of unspecified site of unspecified female breast: Secondary | ICD-10-CM

## 2012-08-24 DIAGNOSIS — C50319 Malignant neoplasm of lower-inner quadrant of unspecified female breast: Secondary | ICD-10-CM

## 2012-08-24 DIAGNOSIS — C801 Malignant (primary) neoplasm, unspecified: Secondary | ICD-10-CM

## 2012-08-24 MED ORDER — PEGFILGRASTIM INJECTION 6 MG/0.6ML
6.0000 mg | Freq: Once | SUBCUTANEOUS | Status: AC
  Administered 2012-08-24: 6 mg via SUBCUTANEOUS
  Filled 2012-08-24: qty 0.6

## 2012-08-24 NOTE — Telephone Encounter (Signed)
Patient here for Neulasta injection following 1st tac chemo treatment.  States not having any nausea, vomiting or diarrhea.  Doesn't have an appetite.  Encouraged to drink lots of fluids and eat small frequent meals to get her protein in.  Knows to call the clinic if she has any problems or questions. Her BP is elevated which is not unusual for her.  States that MD is working on it   BP  Left arm 222/78 (automatic machine).   Rechecked 5-10 minutes later manually  BP 196/84.

## 2012-08-26 ENCOUNTER — Ambulatory Visit (HOSPITAL_BASED_OUTPATIENT_CLINIC_OR_DEPARTMENT_OTHER): Payer: Medicare Other

## 2012-08-26 ENCOUNTER — Telehealth: Payer: Self-pay | Admitting: *Deleted

## 2012-08-26 ENCOUNTER — Other Ambulatory Visit: Payer: Self-pay | Admitting: *Deleted

## 2012-08-26 VITALS — BP 188/78 | HR 97 | Temp 99.2°F

## 2012-08-26 DIAGNOSIS — R0789 Other chest pain: Secondary | ICD-10-CM

## 2012-08-26 DIAGNOSIS — C50919 Malignant neoplasm of unspecified site of unspecified female breast: Secondary | ICD-10-CM

## 2012-08-26 DIAGNOSIS — C50319 Malignant neoplasm of lower-inner quadrant of unspecified female breast: Secondary | ICD-10-CM | POA: Diagnosis not present

## 2012-08-26 MED ORDER — SODIUM CHLORIDE 0.9 % IV SOLN
Freq: Once | INTRAVENOUS | Status: AC
  Administered 2012-08-26: 14:00:00 via INTRAVENOUS

## 2012-08-26 MED ORDER — HYDROCODONE-ACETAMINOPHEN 5-325 MG PO TABS
1.0000 | ORAL_TABLET | Freq: Once | ORAL | Status: AC
  Administered 2012-08-26: 1 via ORAL

## 2012-08-26 MED ORDER — POTASSIUM CHLORIDE ER 10 MEQ PO CPCR: 10.0000 meq | ORAL_CAPSULE | Freq: Two times a day (BID) | ORAL | Status: DC

## 2012-08-26 MED ORDER — LISINOPRIL 10 MG PO TABS
10.0000 mg | ORAL_TABLET | Freq: Once | ORAL | Status: AC
  Administered 2012-08-26: 10 mg via ORAL
  Filled 2012-08-26: qty 1

## 2012-08-26 MED ORDER — LORAZEPAM 2 MG/ML IJ SOLN
0.2500 mg | Freq: Once | INTRAMUSCULAR | Status: AC
  Administered 2012-08-26: 0.25 mg via INTRAVENOUS

## 2012-08-26 NOTE — Telephone Encounter (Signed)
This RN spoke with pt per her call stating onset of symptoms and inquiring " what ER should I go to ?".  Per conversation pt is not able to obtain temp due to thermometer not reading correctly. Pt denies chills or cough. She does feel weak, hot, nausea with anorexia as well as not able to take in fluids well. [name] states she is urinating well with color as "yellow".  She used antinausea medication yesterday with minimial benefit, but has taken any today with inquiry " is it ok to take it if I am not eating ".  Husband was able to state pt's skin was warm and dry " feels normal to me ".  Per call pt will come in to this clinic at 130pm to obtain IVF's.

## 2012-08-26 NOTE — Progress Notes (Signed)
Patient c/o tightness at sternum, denies any SOB, VS 207/108 106 99.2. Dr Darnelle Catalan notified and received orders for Lisinopril 10 mg po and Ativan 0.25 mg IV.  Patient continued to c/o tightness at sternum, BP now 188/78. Order received for Vicodin.

## 2012-08-26 NOTE — Telephone Encounter (Signed)
This RN spoke with pt while in treatment room receiving IVF.  Reviewed and given written instructions regarding self care and use of appropriate medications for symptom management. This RN also answered pt's questions regarding pain issues and reasons certain medications used, including reiterating need to maintain bp at theraputic level and possible outcome if not controlled.  Pt's husband and daughter was at chairside and verbalized understanding with pt regarding above recommendations.

## 2012-08-26 NOTE — Patient Instructions (Addendum)
Dehydration, Adult Dehydration is when you lose more fluids from the body than you take in. Vital organs like the kidneys, brain, and heart cannot function without a proper amount of fluids and salt. Any loss of fluids from the body can cause dehydration.  CAUSES   Vomiting.  Diarrhea.  Excessive sweating.  Excessive urine output.  Fever. SYMPTOMS  Mild dehydration  Thirst.  Dry lips.  Slightly dry mouth. Moderate dehydration  Very dry mouth.  Sunken eyes.  Skin does not bounce back quickly when lightly pinched and released.  Dark urine and decreased urine production.  Decreased tear production.  Headache. Severe dehydration  Very dry mouth.  Extreme thirst.  Rapid, weak pulse (more than 100 beats per minute at rest).  Cold hands and feet.  Not able to sweat in spite of heat and temperature.  Rapid breathing.  Blue lips.  Confusion and lethargy.  Difficulty being awakened.  Minimal urine production.  No tears. DIAGNOSIS  Your caregiver will diagnose dehydration based on your symptoms and your exam. Blood and urine tests will help confirm the diagnosis. The diagnostic evaluation should also identify the cause of dehydration. TREATMENT  Treatment of mild or moderate dehydration can often be done at home by increasing the amount of fluids that you drink. It is best to drink small amounts of fluid more often. Drinking too much at one time can make vomiting worse. Refer to the home care instructions below. Severe dehydration needs to be treated at the hospital where you will probably be given intravenous (IV) fluids that contain water and electrolytes. HOME CARE INSTRUCTIONS   Ask your caregiver about specific rehydration instructions.  Drink enough fluids to keep your urine clear or pale yellow.  Drink small amounts frequently if you have nausea and vomiting.  Eat as you normally do.  Avoid:  Foods or drinks high in sugar.  Carbonated  drinks.  Juice.  Extremely hot or cold fluids.  Drinks with caffeine.  Fatty, greasy foods.  Alcohol.  Tobacco.  Overeating.  Gelatin desserts.  Wash your hands well to avoid spreading bacteria and viruses.  Only take over-the-counter or prescription medicines for pain, discomfort, or fever as directed by your caregiver.  Ask your caregiver if you should continue all prescribed and over-the-counter medicines.  Keep all follow-up appointments with your caregiver. SEEK MEDICAL CARE IF:  You have abdominal pain and it increases or stays in one area (localizes).  You have a rash, stiff neck, or severe headache.  You are irritable, sleepy, or difficult to awaken.  You are weak, dizzy, or extremely thirsty. SEEK IMMEDIATE MEDICAL CARE IF:   You are unable to keep fluids down or you get worse despite treatment.  You have frequent episodes of vomiting or diarrhea.  You have blood or green matter (bile) in your vomit.  You have blood in your stool or your stool looks black and tarry.  You have not urinated in 6 to 8 hours, or you have only urinated a small amount of very dark urine.  You have a fever.  You faint. MAKE SURE YOU:   Understand these instructions.  Will watch your condition.  Will get help right away if you are not doing well or get worse. Document Released: 07/13/2005 Document Revised: 10/05/2011 Document Reviewed: 03/02/2011 Exit[employer] Patient Information 2013 [employer].  

## 2012-08-30 ENCOUNTER — Telehealth: Payer: Self-pay | Admitting: *Deleted

## 2012-08-30 ENCOUNTER — Other Ambulatory Visit: Payer: Medicare Other | Admitting: Lab

## 2012-08-30 ENCOUNTER — Ambulatory Visit: Payer: Medicare Other | Admitting: Physician Assistant

## 2012-08-30 ENCOUNTER — Telehealth: Payer: Self-pay | Admitting: Oncology

## 2012-08-30 ENCOUNTER — Encounter: Payer: Self-pay | Admitting: Oncology

## 2012-08-30 NOTE — Progress Notes (Signed)
Put fmla form on nurse's desk °

## 2012-08-30 NOTE — Telephone Encounter (Signed)
This RN attempted to contact pt due to her call to scheduling today to cancel appointment for 1 week post chemo follow up.  Obtained identified VM and message left requesting return call to this RN due to concern for post treatment side effects.

## 2012-08-30 NOTE — Telephone Encounter (Signed)
error 

## 2012-08-30 NOTE — Telephone Encounter (Signed)
Pt called today to cx lb/fu for this afternoon. Pt states she does not wish to r/s and was reminded of her next appt for lb/fu/tx 2/18. Message to desk nurse.

## 2012-08-31 ENCOUNTER — Telehealth: Payer: Self-pay | Admitting: Emergency Medicine

## 2012-08-31 NOTE — Telephone Encounter (Signed)
Called patient and left message on home phone and cell phone message system.  Strongly encouraged patient to come for labs ASAP.   Also left message stating that we would like to assess her after her first chemo infusion.  Instructed patient to call to schedule appointment or to come to the Cancer Center for walk-in labs.

## 2012-09-01 ENCOUNTER — Encounter: Payer: Self-pay | Admitting: Oncology

## 2012-09-01 ENCOUNTER — Encounter (HOSPITAL_COMMUNITY): Payer: Self-pay | Admitting: *Deleted

## 2012-09-01 ENCOUNTER — Emergency Department (HOSPITAL_COMMUNITY)
Admission: EM | Admit: 2012-09-01 | Discharge: 2012-09-01 | Disposition: A | Discharge status: Home / Self Care | Payer: Medicare Other | Attending: Emergency Medicine | Admitting: Emergency Medicine

## 2012-09-01 ENCOUNTER — Telehealth: Payer: Self-pay | Admitting: *Deleted

## 2012-09-01 ENCOUNTER — Telehealth: Payer: Self-pay | Admitting: Oncology

## 2012-09-01 DIAGNOSIS — I10 Essential (primary) hypertension: Secondary | ICD-10-CM | POA: Insufficient documentation

## 2012-09-01 DIAGNOSIS — R1110 Vomiting, unspecified: Secondary | ICD-10-CM | POA: Diagnosis not present

## 2012-09-01 DIAGNOSIS — R5381 Other malaise: Secondary | ICD-10-CM | POA: Insufficient documentation

## 2012-09-01 DIAGNOSIS — C50919 Malignant neoplasm of unspecified site of unspecified female breast: Secondary | ICD-10-CM | POA: Diagnosis not present

## 2012-09-01 DIAGNOSIS — Z8701 Personal history of pneumonia (recurrent): Secondary | ICD-10-CM | POA: Diagnosis not present

## 2012-09-01 DIAGNOSIS — Z8719 Personal history of other diseases of the digestive system: Secondary | ICD-10-CM | POA: Diagnosis not present

## 2012-09-01 DIAGNOSIS — R197 Diarrhea, unspecified: Secondary | ICD-10-CM | POA: Insufficient documentation

## 2012-09-01 DIAGNOSIS — E860 Dehydration: Secondary | ICD-10-CM | POA: Diagnosis not present

## 2012-09-01 DIAGNOSIS — Z79899 Other long term (current) drug therapy: Secondary | ICD-10-CM | POA: Diagnosis not present

## 2012-09-01 LAB — CBC WITH DIFFERENTIAL/PLATELET
Basophils Relative: 1 % (ref 0–1)
Eosinophils Absolute: 0.1 10*3/uL (ref 0.0–0.7)
Eosinophils Relative: 1 % (ref 0–5)
Hemoglobin: 12.9 g/dL (ref 12.0–15.0)
Lymphs Abs: 0.9 10*3/uL (ref 0.7–4.0)
MCH: 30.8 pg (ref 26.0–34.0)
MCHC: 33.5 g/dL (ref 30.0–36.0)
MCV: 91.9 fL (ref 78.0–100.0)
Monocytes Absolute: 0.7 10*3/uL (ref 0.1–1.0)
Neutrophils Relative %: 66 % (ref 43–77)
Platelets: 164 10*3/uL (ref 150–400)
RBC: 4.19 MIL/uL (ref 3.87–5.11)

## 2012-09-01 LAB — COMPREHENSIVE METABOLIC PANEL
BUN: 9 mg/dL (ref 6–23)
CO2: 25 mEq/L (ref 19–32)
Calcium: 8.6 mg/dL (ref 8.4–10.5)
Chloride: 98 mEq/L (ref 96–112)
Creatinine, Ser: 0.59 mg/dL (ref 0.50–1.10)
GFR calc Af Amer: >90 mL/min (ref >90)
GFR calc non Af Amer: >90 mL/min (ref >90)
Total Bilirubin: 0.5 mg/dL (ref 0.3–1.2)

## 2012-09-01 LAB — LIPASE, BLOOD: Lipase: 20 U/L (ref 11–59)

## 2012-09-01 LAB — POCT I-STAT, CHEM 8
Chloride: 102 mEq/L (ref 96–112)
HCT: 38 % (ref 36.0–46.0)
Potassium: 3.3 mEq/L — ABNORMAL LOW (ref 3.5–5.1)

## 2012-09-01 LAB — LACTIC ACID, PLASMA: Lactic Acid, Venous: 1.3 mmol/L (ref 0.5–2.2)

## 2012-09-01 MED ORDER — HEPARIN SOD (PORK) LOCK FLUSH 100 UNIT/ML IV SOLN
INTRAVENOUS | Status: AC
  Administered 2012-09-01: 500 [IU]
  Filled 2012-09-01: qty 5

## 2012-09-01 MED ORDER — POTASSIUM CHLORIDE 10 MEQ/100ML IV SOLN
10.0000 meq | Freq: Once | INTRAVENOUS | Status: AC
  Administered 2012-09-01: 10 meq via INTRAVENOUS
  Filled 2012-09-01: qty 100

## 2012-09-01 MED ORDER — SODIUM CHLORIDE 0.9 % IV SOLN
INTRAVENOUS | Status: DC
  Administered 2012-09-01 (×2): via INTRAVENOUS

## 2012-09-01 MED ORDER — SODIUM CHLORIDE 0.9 % IV BOLUS (SEPSIS)
1000.0000 mL | Freq: Once | INTRAVENOUS | Status: AC
  Administered 2012-09-01: 1000 mL via INTRAVENOUS

## 2012-09-01 MED ORDER — LOPERAMIDE HCL 2 MG PO CAPS
2.0000 mg | ORAL_CAPSULE | Freq: Once | ORAL | Status: AC
  Administered 2012-09-01: 2 mg via ORAL
  Filled 2012-09-01: qty 1

## 2012-09-01 MED ORDER — LOPERAMIDE HCL 2 MG PO CAPS: 2.0000 mg | ORAL_CAPSULE | Freq: Four times a day (QID) | ORAL | Status: DC | PRN

## 2012-09-01 MED ORDER — HEPARIN SOD (PORK) LOCK FLUSH 100 UNIT/ML IV SOLN
INTRAVENOUS | Status: AC
  Filled 2012-09-01: qty 5

## 2012-09-01 MED ORDER — ONDANSETRON HCL 4 MG/2ML IJ SOLN: 4.0000 mg | Freq: Four times a day (QID) | INTRAMUSCULAR | Status: DC | PRN

## 2012-09-01 NOTE — ED Notes (Signed)
Pt declines Zofran at present; will let RN know if it is needed.

## 2012-09-01 NOTE — ED Notes (Signed)
Pt c/o diarrhea x 2 days; history of breast cancer--taking chemo currently; told to come to ER for fluids and labwork; denies abd pain; denies nausea; c/o weakness

## 2012-09-01 NOTE — Telephone Encounter (Signed)
Medical Oncology On call  Patient called due to diarrhea x 2 days "every 2-3 hours". Denies fever, no one sick at home.  Had first chemotherapy with taxotere, adriamycin, cytoxan on 1-28 and neulasta 1-29, then cancelled follow up appointment at [facility] 08-30-12.  Patient agrees with going to ED now, as she may need IVF, potassium etc.  L.Dao Memmott, MD [phone]

## 2012-09-01 NOTE — Telephone Encounter (Signed)
This RN spoke with pt today per follow up from previous call earlier in the week cancelling appointment.  Note pt went to the ER early AM post call to office at 235AM.  Per conversation pt stated " I feel much better now ". This RN discussed with pt earlier call regarding cancelling appointment for this week- and inquired with pt her concerns related to treatment plan.  Janice Powell states she does not want to do further chemo.  This RN validated pt's concerns as well as reiterated need for continued follow up with whatever decisions she makes. Per conversation pt stated she will keep appointment on the 18th for lab and mid level visit but does not want to proceed with chemotherapy.  This RN informed pt above would be given to MD for review so appropriate care can be discussed.  Pt agreed, no further questions.

## 2012-09-01 NOTE — Progress Notes (Signed)
Faxed fmla form to [facility] @ [phone] °

## 2012-09-01 NOTE — ED Provider Notes (Signed)
History     CSN: [id number]  Arrival date & time 09/01/12  0257   First MD Initiated Contact with Patient 09/01/12 0327      No chief complaint on file.   (Consider location/radiation/quality/duration/timing/severity/associated sxs/prior treatment) HPI Hx per PT/ Active breast cancer, chemo 08/23/12 and now diarrhea x 2 days with generalized weakness, vomiting x 3 today, no blood in emesis or stools, no F/C,no known sick contacts, no ABD pain. No rash. MOD in severity, dec appetite, states unable to eat anything. No nausea at this time. No recent travel or ABx.  Past Medical History  Diagnosis Date  . Hypertension   . Cancer     right breast  . Pneumonia     hx  . GERD (gastroesophageal reflux disease)     occ    Past Surgical History  Procedure Date  . Appendectomy 1992  . Tubal ligation 1979  . Cholecystectomy 2000  . Portacath placement 08/15/2012    Procedure: INSERTION PORT-A-CATH;  Surgeon: Ernestene Mention, MD;  Location: [facility] OR;  Service: General;  Laterality: Left;    Family History  Problem Relation Age of Onset  . Heart disease Mother     History  Substance Use Topics  . Smoking status: Never Smoker   . Smokeless tobacco: Not on file  . Alcohol Use: No    OB History    Grav Para Term Preterm Abortions TAB SAB Ect Mult Living                  Review of Systems  Constitutional: Negative for fever and chills.  HENT: Negative for neck pain and neck stiffness.   Eyes: Negative for visual disturbance.  Respiratory: Negative for shortness of breath.   Cardiovascular: Negative for chest pain.  Gastrointestinal: Positive for vomiting and diarrhea. Negative for abdominal pain.  Genitourinary: Negative for dysuria.  Musculoskeletal: Negative for back pain.  Skin: Negative for rash.  Neurological: Positive for weakness. Negative for headaches.  All other systems reviewed and are negative.    Allergies  Review of patient's allergies indicates no known  allergies.  Home Medications   Current Outpatient Rx  Name  Route  Sig  Dispense  Refill  . POTASSIUM CHLORIDE ER 10 MEQ PO CPCR   Oral   Take 1 capsule (10 mEq total) by mouth 2 (two) times daily.   60 capsule   1   . DEXAMETHASONE 4 MG PO TABS      Take 2 tablets by mouth twice a day as directed   30 tablet   3   . HYDROCODONE-ACETAMINOPHEN 5-325 MG PO TABS   Oral   Take 1-2 tablets by mouth every 4 (four) hours as needed for pain.   50 tablet   1   . LIDOCAINE-PRILOCAINE 2.5-2.5 % EX CREA   Topical   Apply 1 application topically as needed.          Marland Kitchen LISINOPRIL 10 MG PO TABS   Oral   Take 1 tablet (10 mg total) by mouth daily.   30 tablet   12   . LORAZEPAM 0.5 MG PO TABS   Oral   Take 1 tablet (0.5 mg total) by mouth at bedtime as needed for anxiety.   30 tablet   0   . METOPROLOL SUCCINATE ER 50 MG PO TB24   Oral   Take 1 tablet (50 mg total) by mouth daily. Take with or immediately following a meal.   30  tablet   12   . PROCHLORPERAZINE MALEATE 10 MG PO TABS   Oral   Take 1 tablet (10 mg total) by mouth every 6 (six) hours as needed (as directed).   30 tablet   0     BP 180/82  Pulse 126  Temp 98.6 F (37 C) (Oral)  Resp 18  SpO2 100%  Physical Exam  Constitutional: She is oriented to person, place, and time. She appears well-developed and well-nourished.  HENT:  Head: Normocephalic and atraumatic.       Dry mm  Eyes: EOM are normal. Pupils are equal, round, and reactive to light.  Neck: Neck supple.  Cardiovascular: Regular rhythm and intact distal pulses.        Tachycardic with systolic murmur  Pulmonary/Chest: Effort normal and breath sounds normal. No respiratory distress. She has no rales.  Abdominal: Soft. Bowel sounds are normal. She exhibits no distension. There is no tenderness.  Musculoskeletal: Normal range of motion. She exhibits no edema and no tenderness.  Neurological: She is alert and oriented to person, place, and  time.  Skin: Skin is warm and dry. No rash noted.    ED Course  Procedures (including critical care time)  Results for orders placed during the hospital encounter of 09/01/12  CBC WITH DIFFERENTIAL      Component Value Range   WBC 5.1  4.0 - 10.5 K/uL   RBC 4.19  3.87 - 5.11 MIL/uL   Hemoglobin 12.9  12.0 - 15.0 g/dL   HCT 45.4  09.8 - 11.9 %   MCV 91.9  78.0 - 100.0 fL   MCH 30.8  26.0 - 34.0 pg   MCHC 33.5  30.0 - 36.0 g/dL   RDW 14.7  82.9 - 56.2 %   Platelets 164  150 - 400 K/uL   Neutrophils Relative 66  43 - 77 %   Lymphocytes Relative 18  12 - 46 %   Monocytes Relative 14 (*) 3 - 12 %   Eosinophils Relative 1  0 - 5 %   Basophils Relative 1  0 - 1 %   Neutro Abs 3.3  1.7 - 7.7 K/uL   Lymphs Abs 0.9  0.7 - 4.0 K/uL   Monocytes Absolute 0.7  0.1 - 1.0 K/uL   Eosinophils Absolute 0.1  0.0 - 0.7 K/uL   Basophils Absolute 0.1  0.0 - 0.1 K/uL   WBC Morphology INCREASED BANDS (>20% BANDS)     Smear Review LARGE PLATELETS PRESENT    COMPREHENSIVE METABOLIC PANEL      Component Value Range   Sodium 134 (*) 135 - 145 mEq/L   Potassium 3.8  3.5 - 5.1 mEq/L   Chloride 98  96 - 112 mEq/L   CO2 25  19 - 32 mEq/L   Glucose, Bld 122 (*) 70 - 99 mg/dL   BUN 9  6 - 23 mg/dL   Creatinine, Ser 1.30  0.50 - 1.10 mg/dL   Calcium 8.6  8.4 - 86.5 mg/dL   Total Protein 6.9  6.0 - 8.3 g/dL   Albumin 3.1 (*) 3.5 - 5.2 g/dL   AST 21  0 - 37 U/L   ALT 14  0 - 35 U/L   Alkaline Phosphatase 47  39 - 117 U/L   Total Bilirubin 0.5  0.3 - 1.2 mg/dL   GFR calc non Af Amer >90  >90 mL/min   GFR calc Af Amer >90  >90 mL/min  LACTIC ACID, PLASMA  Component Value Range   Lactic Acid, Venous 1.3  0.5 - 2.2 mmol/L  LIPASE, BLOOD      Component Value Range   Lipase 20  11 - 59 U/L  POCT I-STAT, CHEM 8      Component Value Range   Sodium 139  135 - 145 mEq/L   Potassium 3.3 (*) 3.5 - 5.1 mEq/L   Chloride 102  96 - 112 mEq/L   BUN 8  6 - 23 mg/dL   Creatinine, Ser 0.86  0.50 - 1.10 mg/dL    Glucose, Bld 578 (*) 70 - 99 mg/dL   Calcium, Ion 4.69 (*) 1.13 - 1.30 mmol/L   TCO2 27  0 - 100 mmol/L   Hemoglobin 12.9  12.0 - 15.0 g/dL   HCT 62.9  52.8 - 41.3 %   IV fluids. IV potassium.  6:31 AM patient feeling significantly better. She feels comfortable to be discharged home and would prefer that over hospital admission. Imodium provided by request. Plan close outpatient followup with strict return precautions verbalized as understood.  MDM  Diarrhea in a cancer patient status post chemotherapy 9 days ago. No fevers. No abdominal pains or vomiting. Evaluated with blood work obtained and reviewed as above. IV potassium provided for mild hypokalemia. Improved with IV fluids. Heart rate normalizing. Serial abdominal exams without tenderness or peritoneal signs. Vital signs and nursing notes reviewed and considered. Stable for discharge home.        Sunnie Nielsen, MD 09/01/12 [phone]

## 2012-09-08 ENCOUNTER — Other Ambulatory Visit: Payer: Self-pay | Admitting: *Deleted

## 2012-09-08 DIAGNOSIS — C50919 Malignant neoplasm of unspecified site of unspecified female breast: Secondary | ICD-10-CM

## 2012-09-10 ENCOUNTER — Other Ambulatory Visit: Payer: Self-pay

## 2012-09-13 ENCOUNTER — Ambulatory Visit (HOSPITAL_BASED_OUTPATIENT_CLINIC_OR_DEPARTMENT_OTHER): Payer: Medicare Other

## 2012-09-13 ENCOUNTER — Other Ambulatory Visit: Payer: Medicare Other | Admitting: Lab

## 2012-09-13 ENCOUNTER — Ambulatory Visit (HOSPITAL_BASED_OUTPATIENT_CLINIC_OR_DEPARTMENT_OTHER): Payer: Medicare Other | Admitting: Physician Assistant

## 2012-09-13 ENCOUNTER — Other Ambulatory Visit (HOSPITAL_BASED_OUTPATIENT_CLINIC_OR_DEPARTMENT_OTHER): Payer: Medicare Other | Admitting: Lab

## 2012-09-13 ENCOUNTER — Encounter: Payer: Self-pay | Admitting: Physician Assistant

## 2012-09-13 ENCOUNTER — Telehealth: Payer: Self-pay | Admitting: Oncology

## 2012-09-13 ENCOUNTER — Encounter: Payer: Self-pay | Admitting: Oncology

## 2012-09-13 VITALS — BP 193/83 | HR 71 | Temp 98.8°F | Resp 20 | Ht 63.0 in | Wt 135.2 lb

## 2012-09-13 VITALS — BP 178/66 | HR 71

## 2012-09-13 DIAGNOSIS — C50919 Malignant neoplasm of unspecified site of unspecified female breast: Secondary | ICD-10-CM | POA: Diagnosis not present

## 2012-09-13 DIAGNOSIS — C50319 Malignant neoplasm of lower-inner quadrant of unspecified female breast: Secondary | ICD-10-CM

## 2012-09-13 DIAGNOSIS — Z171 Estrogen receptor negative status [ER-]: Secondary | ICD-10-CM | POA: Diagnosis not present

## 2012-09-13 DIAGNOSIS — C773 Secondary and unspecified malignant neoplasm of axilla and upper limb lymph nodes: Secondary | ICD-10-CM

## 2012-09-13 DIAGNOSIS — I10 Essential (primary) hypertension: Secondary | ICD-10-CM

## 2012-09-13 DIAGNOSIS — Z5111 Encounter for antineoplastic chemotherapy: Secondary | ICD-10-CM | POA: Diagnosis not present

## 2012-09-13 DIAGNOSIS — C801 Malignant (primary) neoplasm, unspecified: Secondary | ICD-10-CM

## 2012-09-13 LAB — COMPREHENSIVE METABOLIC PANEL (CC13)
Albumin: 3.4 g/dL — ABNORMAL LOW (ref 3.5–5.0)
Alkaline Phosphatase: 61 U/L (ref 40–150)
CO2: 28 mEq/L (ref 22–29)
Glucose: 86 mg/dl (ref 70–99)
Potassium: 3.2 mEq/L — ABNORMAL LOW (ref 3.5–5.1)
Sodium: 142 mEq/L (ref 136–145)
Total Protein: 6.7 g/dL (ref 6.4–8.3)

## 2012-09-13 LAB — CBC WITH DIFFERENTIAL/PLATELET
BASO%: 0.6 % (ref 0.0–2.0)
Basophils Absolute: 0.1 10*3/uL (ref 0.0–0.1)
Eosinophils Absolute: 0 10*3/uL (ref 0.0–0.5)
HCT: 38.5 % (ref 34.8–46.6)
HGB: 12.5 g/dL (ref 11.6–15.9)
MONO#: 1.2 10*3/uL — ABNORMAL HIGH (ref 0.1–0.9)
NEUT#: 8.4 10*3/uL — ABNORMAL HIGH (ref 1.5–6.5)
NEUT%: 73.7 % (ref 38.4–76.8)
WBC: 11.3 10*3/uL — ABNORMAL HIGH (ref 3.9–10.3)
lymph#: 1.7 10*3/uL (ref 0.9–3.3)

## 2012-09-13 MED ORDER — PALONOSETRON HCL INJECTION 0.25 MG/5ML
0.2500 mg | Freq: Once | INTRAVENOUS | Status: AC
  Administered 2012-09-13: 0.25 mg via INTRAVENOUS

## 2012-09-13 MED ORDER — DEXAMETHASONE SODIUM PHOSPHATE 4 MG/ML IJ SOLN
12.0000 mg | Freq: Once | INTRAMUSCULAR | Status: AC
  Administered 2012-09-13: 11:00:00 via INTRAVENOUS

## 2012-09-13 MED ORDER — LORAZEPAM 2 MG/ML IJ SOLN
0.5000 mg | Freq: Once | INTRAMUSCULAR | Status: AC
  Administered 2012-09-13: 0.5 mg via INTRAVENOUS

## 2012-09-13 MED ORDER — ONDANSETRON HCL 8 MG PO TABS: 8.0000 mg | ORAL_TABLET | Freq: Two times a day (BID) | ORAL | Status: DC | PRN

## 2012-09-13 MED ORDER — SODIUM CHLORIDE 0.9 % IV SOLN
150.0000 mg | Freq: Once | INTRAVENOUS | Status: AC
  Administered 2012-09-13: 150 mg via INTRAVENOUS
  Filled 2012-09-13: qty 5

## 2012-09-13 MED ORDER — SODIUM CHLORIDE 0.9 % IV SOLN
Freq: Once | INTRAVENOUS | Status: AC
  Administered 2012-09-13: 50 mL via INTRAVENOUS

## 2012-09-13 MED ORDER — DOXORUBICIN HCL CHEMO IV INJECTION 2 MG/ML
50.0000 mg/m2 | Freq: Once | INTRAVENOUS | Status: AC
  Administered 2012-09-13: 84 mg via INTRAVENOUS
  Filled 2012-09-13: qty 42

## 2012-09-13 MED ORDER — DOCETAXEL CHEMO INJECTION 160 MG/16ML
75.0000 mg/m2 | Freq: Once | INTRAVENOUS | Status: AC
  Administered 2012-09-13: 130 mg via INTRAVENOUS
  Filled 2012-09-13: qty 13

## 2012-09-13 MED ORDER — SODIUM CHLORIDE 0.9 % IV SOLN
500.0000 mg/m2 | Freq: Once | INTRAVENOUS | Status: AC
  Administered 2012-09-13: 840 mg via INTRAVENOUS
  Filled 2012-09-13: qty 42

## 2012-09-13 MED ORDER — SODIUM CHLORIDE 0.9 % IJ SOLN
10.0000 mL | INTRAMUSCULAR | Status: DC | PRN
  Administered 2012-09-13: 10 mL
  Filled 2012-09-13: qty 10

## 2012-09-13 MED ORDER — HEPARIN SOD (PORK) LOCK FLUSH 100 UNIT/ML IV SOLN
500.0000 [IU] | Freq: Once | INTRAVENOUS | Status: AC | PRN
  Administered 2012-09-13: 500 [IU]
  Filled 2012-09-13: qty 5

## 2012-09-13 NOTE — Patient Instructions (Addendum)
[facility] Discharge Instructions for Patients Receiving Chemotherapy  Today you received the following chemotherapy agents adriamycin/Cytoxan/Taxotere To help prevent nausea and vomiting after your treatment, we encourage you to take your nausea medication  As per Dr. Darnelle Catalan.  If you develop nausea and vomiting that is not controlled by your nausea medication, call the clinic. If it is after clinic hours your family physician or the after hours number for the clinic or go to the Emergency Department.   BELOW ARE SYMPTOMS THAT SHOULD BE REPORTED IMMEDIATELY:  *FEVER GREATER THAN 100.5 F  *CHILLS WITH OR WITHOUT FEVER  NAUSEA AND VOMITING THAT IS NOT CONTROLLED WITH YOUR NAUSEA MEDICATION  *UNUSUAL SHORTNESS OF BREATH  *UNUSUAL BRUISING OR BLEEDING  TENDERNESS IN MOUTH AND THROAT WITH OR WITHOUT PRESENCE OF ULCERS  *URINARY PROBLEMS  *BOWEL PROBLEMS  UNUSUAL RASH Items with * indicate a potential emergency and should be followed up as soon as possible.  . Feel free to call the clinic you have any questions or concerns. The clinic phone number is [phone].   I have been informed and understand all the instructions given to me. I know to contact the clinic, my physician, or go to the Emergency Department if any problems should occur. I do not have any questions at this time, but understand that I may call the clinic during office hours   should I have any questions or need assistance in obtaining follow up care.    __________________________________________  _____________  __________ Signature of Patient or Authorized Representative            Date                   Time    __________________________________________ Nurse's Signature

## 2012-09-13 NOTE — Progress Notes (Signed)
ID: Janice Powell   DOB: 1946-05-24  MR#: [id number]  CSN#:[id number]  PCP: Londell Moh, MD GYN:  SUClaud Kelp MD OTHER MD:   HISTORY OF PRESENT ILLNESS: Janice Powell noted a mass in her right breast sometime in 2011. 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 More recently her husband twisted her arm to get the mass looked at, and she brought it to Dr. Carolee Rota attention. Diagnostic mammography and right ultrasonography at [facility] 03/02/2012 showed a 6.78 cm irregular mass in the inner aspect of the right breast. There was nipple retraction and erythema around the nipple. The largest lymph node was noted in the right axilla. By ultrasound the large irregular hypoechoic mass was noted in the breast with multiple enlarged axillary lymph nodes. Physical exam confirmed a hard breast with erythema around the right nipple extending inferiorly. The nipple is slightly inverted.  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 Her subsequent history is as detailed below  INTERVAL HISTORY: Janice Powell returns today accompanied by her husband Janice Powell for follow up of her locally advanced right breast cancer. She is due for her second of 6 planned q. three-week doses of docetaxel/doxorubicin/cyclophosphamide today, given in the neoadjuvant setting.  Patient had a terrible time with cycle 1. In fact she felt so poorly, she was unable to make it in for her nadir appointment on day 8. She subsequently had a visit to the Emergency Department for diarrhea and nausea. Fortunately, these issues eventually resolved. She reports that she began having nausea and vomiting "a few days after the shots". She also had significant bony aches and pains all over her body which she attributes correctly to the Neulasta injection. Simply put, she does not feel that she can proceed with chemotherapy if she has  to receive the Neulasta injection. On the other hand, she does seem to be having some positive improvement in the right breast after only one dose of chemotherapy, so we spent quite a bit of time today reviewing her options and discussing what changes might be made so that she may best tolerate chemotherapy.    REVIEW OF SYSTEMS: Janice Powell feels like she may have had a fever, although she never actually checked her temperature. She has had some night sweats, but no chills or riders. She denies any rashes or skin changes. She's had no signs of abnormal bleeding. Her mouth feels dry, but she's had no ulcerations. Her appetite has been reduced. She is finally feeling better, but did in fact have nausea and emesis as noted above. She also had diarrhea which is now controlled. There is no evidence of blood or mucus in the stool. She denies any cough, increased shortness of breath, chest pain, or palpitations. She's had some occasional headaches, but no dizziness or change in vision. Again, her biggest complaint was severe pain associated with Neulasta injection, and again this is also improved at this point. She's had no peripheral swelling and denies any signs of peripheral neuropathy in either the upper or lower extremities.  A detailed review of systems is otherwise noncontributory.    PAST MEDICAL HISTORY: Past Medical History  Diagnosis Date  . Hypertension   . Cancer     right breast  . Pneumonia     hx  . GERD (gastroesophageal reflux disease)     occ  GERD osteopenia  PAST SURGICAL HISTORY: Past  Surgical History  Procedure Laterality Date  . Appendectomy  1992  . Tubal ligation  1979  . Cholecystectomy  2000  . Portacath placement  08/15/2012    Procedure: INSERTION PORT-A-CATH;  Surgeon: Ernestene Mention, MD;  Location: [facility] OR;  Service: General;  Laterality: Left;    FAMILY HISTORY Family History  Problem Relation Age of Onset  . Heart disease Mother    the patient's father died  in his sleep at age 61. The patient's mother died at the age of 75 from a myocardial infarction. The patient has one brother and 2 sisters, all surviving. There is no history of breast or ovarian cancer in the family.  GYNECOLOGIC HISTORY: Menarche age 46, first live birth age 62, she is GX P5, menopause age 42. She did not take hormone replacement.  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 Her husband Janice Powell worked in the post office 41 years, and is also now retired. He is a former Arts development officer. Son Janice Powell lives in [place] and manages an [employer] store. Daughter Janice Powell also lives in [place] and manages the IT Department at [employer]. Son Janice Powell is a Therapist, sports. Daughter Janice Powell is a Doctor, hospital for [employer]. Son Janice Powell is an Teaching laboratory technician. The patient has 8 grandchildren. She attends the white [employer]   ADVANCED DIRECTIVES: Not in place  HEALTH MAINTENANCE: History  Substance Use Topics  . Smoking status: Never Smoker   . Smokeless tobacco: Not on file  . Alcohol Use: No     Colonoscopy:  PAP:  Bone density:  January 2011  Lipid panel:  No Known Allergies  Current Outpatient Prescriptions  Medication Sig Dispense Refill  . dexamethasone (DECADRON) 4 MG tablet Take 2 tablets by mouth twice a day as directed  30 tablet  3  . lidocaine-prilocaine (EMLA) cream Apply 1 application topically as needed.       Marland Kitchen lisinopril (PRINIVIL) 10 MG tablet Take 1 tablet (10 mg total) by mouth daily.  30 tablet  12  . loperamide (IMODIUM) 2 MG capsule Take 1 capsule (2 mg total) by mouth 4 (four) times daily as needed for diarrhea or loose stools.  12 capsule  0  . metoprolol succinate (TOPROL XL) 50 MG 24 hr tablet Take 1 tablet (50 mg total) by mouth daily. Take with or immediately following a meal.  30 tablet  12  . prochlorperazine (COMPAZINE) 10 MG tablet Take 10 mg by mouth every 6 (six) hours  as needed. Nausea      . HYDROcodone-acetaminophen (NORCO/VICODIN) 5-325 MG per tablet Take 1-2 tablets by mouth every 4 (four) hours as needed for pain.  50 tablet  1  . LORazepam (ATIVAN) 0.5 MG tablet Take 1 tablet (0.5 mg total) by mouth at bedtime as needed for anxiety.  30 tablet  0  . ondansetron (ZOFRAN) 8 MG tablet Take 1 tablet (8 mg total) by mouth every 12 (twelve) hours as needed for nausea.  30 tablet  1   No current facility-administered medications for this visit.    OBJECTIVE: Middle-aged [place] American woman in no acute distress Filed Vitals:   09/13/12 0844  BP: 193/83  Pulse: 71  Temp: 98.8 F (37.1 C)  Resp: 20     Body mass index is 23.96 kg/(m^2).    ECOG FS: 1 Filed Weights   09/13/12 0844  Weight: 135 lb 3.2 oz (61.326  kg)   Sclerae unicteric Oropharynx clear No cervical or supraclavicular adenopathy palpated Lungs clear to auscultation no rales or rhonchi,  Heart regular rate and rhythm Abdomen  soft, nontender, positive bowel sounds MSK no focal spinal tenderness No peripheral edema Neuro: nonfocal, well oriented, with anxious affect Breasts:  The mass effects the central portion of the right breast, including the entire areolar area.  The breast itself is hard. Although there is continued evidence of oozing from the lesion, there does appear to be quite a bit of dark, necrotic tissue which was previously erythematous.  A photograph was taken of the right breast to be filed in the patient's chart. The left breast is unremarkable. There is palpable adenopathy in the right axilla, the left axilla being benign.   LAB RESULTS: CBC    Component Value Date/Time   WBC 11.3* 09/13/2012 0806   WBC 5.1 09/01/2012 0319   RBC 4.03 09/13/2012 0806   RBC 4.19 09/01/2012 0319   HGB 12.5 09/13/2012 0806   HGB 12.9 09/01/2012 0502   HCT 38.5 09/13/2012 0806   HCT 38.0 09/01/2012 0502   PLT 364 09/13/2012 0806   PLT 164 09/01/2012 0319   MCV 95.5 09/13/2012 0806   MCV 91.9  09/01/2012 0319   MCH 31.0 09/13/2012 0806   MCH 30.8 09/01/2012 0319   MCHC 32.5 09/13/2012 0806   MCHC 33.5 09/01/2012 0319   RDW 15.1* 09/13/2012 0806   RDW 13.6 09/01/2012 0319   LYMPHSABS 1.7 09/13/2012 0806   LYMPHSABS 0.9 09/01/2012 0319   MONOABS 1.2* 09/13/2012 0806   MONOABS 0.7 09/01/2012 0319   EOSABS 0.0 09/13/2012 0806   EOSABS 0.1 09/01/2012 0319   BASOSABS 0.1 09/13/2012 0806   BASOSABS 0.1 09/01/2012 0319        Chemistry      Component Value Date/Time   NA 142 09/13/2012 0805   NA 139 09/01/2012 0502      Component Value Date/Time   CALCIUM 8.8 09/13/2012 0805   CALCIUM 8.6 09/01/2012 0319       Lab Results  Component Value Date   LABCA2 91* 09/13/2012     STUDIES:  Echocardiogram on 08/19/2012 showed an ejection fraction of 60-65%.   ASSESSMENT: 67 y.o. [place] woman   (1)  s/p Right breast and axillary node biopsy 03/08/2012 for a clinical T4, N1-2', stage Powell invasive ductal carcinoma, grade 3, triple negative, with an MIB-1 of 93%.  (2)  being treated in the neoadjuvant setting, the goal being to complete 6 cycles of docetaxel/doxorubicin/cyclophosphamide given every 3 weeks, with patient declining Neulasta injections after cycle 1.  (3) hypertension, poorly controlled  PLAN: This case was reviewed with Dr. Darnelle Catalan who also spoke with an exam and the patient today. He does feel that there has been some improvement in the right breast, primarily in the fact that there is now evidence of necrotic tissue.   After much discussion, Janice Powell will proceed to chemotherapy today as scheduled for day 1 cycle 2 of 6 planned q. three-week doses of docetaxel/doxorubicin/cyclophosphamide given in the neoadjuvant setting. She declines the Neulasta injection on day 2. We will plan, however, to follow her very closely with labs, and she will be on antibiotics prophylactically for neutropenia. She also understands the importance of contacting us immediately with any fevers of 100 or  above.  We also added ondansetron to her antinausea regimen which will still include dexamethasone, prochlorperazine, and lorazepam. Hopefully this will help with the nausea and emesis. We  will continue to reassess a regular basis, and we'll make any changes necessary to help Janice Powell tolerate and complete her chemotherapy as planned.  Over half of our one-hour appointment today was spent counseling the patient regarding her prognosis, her treatment plan, and her options.  Janice Powell voices understanding and agreement with our plan, noticed called any changes or problems.    Janice Powell    09/13/2012

## 2012-09-13 NOTE — Telephone Encounter (Signed)
gv pt appt schedule for February thru May.  °

## 2012-09-14 ENCOUNTER — Other Ambulatory Visit: Payer: Self-pay | Admitting: Physician Assistant

## 2012-09-14 ENCOUNTER — Ambulatory Visit: Payer: Medicare Other

## 2012-09-14 DIAGNOSIS — E876 Hypokalemia: Secondary | ICD-10-CM

## 2012-09-14 DIAGNOSIS — C50919 Malignant neoplasm of unspecified site of unspecified female breast: Secondary | ICD-10-CM

## 2012-09-20 ENCOUNTER — Encounter: Payer: Self-pay | Admitting: Oncology

## 2012-09-20 ENCOUNTER — Other Ambulatory Visit (HOSPITAL_BASED_OUTPATIENT_CLINIC_OR_DEPARTMENT_OTHER): Payer: Medicare Other | Admitting: Lab

## 2012-09-20 ENCOUNTER — Encounter: Payer: Self-pay | Admitting: Physician Assistant

## 2012-09-20 ENCOUNTER — Ambulatory Visit (HOSPITAL_BASED_OUTPATIENT_CLINIC_OR_DEPARTMENT_OTHER): Payer: Medicare Other | Admitting: Physician Assistant

## 2012-09-20 VITALS — BP 180/92 | HR 76 | Temp 98.5°F | Resp 20 | Ht 63.0 in | Wt 131.5 lb

## 2012-09-20 DIAGNOSIS — D709 Neutropenia, unspecified: Secondary | ICD-10-CM | POA: Diagnosis not present

## 2012-09-20 DIAGNOSIS — E876 Hypokalemia: Secondary | ICD-10-CM

## 2012-09-20 DIAGNOSIS — C50919 Malignant neoplasm of unspecified site of unspecified female breast: Secondary | ICD-10-CM | POA: Diagnosis not present

## 2012-09-20 DIAGNOSIS — B370 Candidal stomatitis: Secondary | ICD-10-CM

## 2012-09-20 DIAGNOSIS — C50911 Malignant neoplasm of unspecified site of right female breast: Secondary | ICD-10-CM

## 2012-09-20 DIAGNOSIS — C773 Secondary and unspecified malignant neoplasm of axilla and upper limb lymph nodes: Secondary | ICD-10-CM | POA: Diagnosis not present

## 2012-09-20 LAB — COMPREHENSIVE METABOLIC PANEL (CC13)
AST: 12 U/L (ref 5–34)
Albumin: 3.2 g/dL — ABNORMAL LOW (ref 3.5–5.0)
Alkaline Phosphatase: 48 U/L (ref 40–150)
BUN: 9.7 mg/dL (ref 7.0–26.0)
Potassium: 3.6 mEq/L (ref 3.5–5.1)

## 2012-09-20 LAB — CBC WITH DIFFERENTIAL/PLATELET
Basophils Absolute: 0 10*3/uL (ref 0.0–0.1)
EOS%: 3.4 % (ref 0.0–7.0)
HCT: 35.4 % (ref 34.8–46.6)
HGB: 11.7 g/dL (ref 11.6–15.9)
LYMPH%: 71.3 % — ABNORMAL HIGH (ref 14.0–49.7)
MCH: 31.3 pg (ref 25.1–34.0)
MCV: 94.7 fL (ref 79.5–101.0)
MONO%: 0 % (ref 0.0–14.0)
NEUT%: 24.2 % — ABNORMAL LOW (ref 38.4–76.8)
Platelets: 212 10*3/uL (ref 145–400)

## 2012-09-20 MED ORDER — CIPROFLOXACIN HCL 500 MG PO TABS: 500.0000 mg | ORAL_TABLET | Freq: Two times a day (BID) | ORAL | Status: DC

## 2012-09-20 MED ORDER — FLUCONAZOLE 100 MG PO TABS: 100.0000 mg | ORAL_TABLET | Freq: Every day | ORAL | Status: DC

## 2012-09-20 NOTE — Progress Notes (Signed)
ID: Janice Powell   DOB: [birth year]  MR#: [id number]  CSN#:[id number]  PCP: Londell Moh, MD GYN:  SUClaud Kelp MD OTHER MD:   HISTORY OF PRESENT ILLNESS: Janice Powell noted a mass in her right breast sometime in 2011. 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 More recently her husband twisted her arm to get the mass looked at, and she brought it to Dr. Carolee Rota attention. Diagnostic mammography and right ultrasonography at [facility] 03/02/2012 showed a 6.78 cm irregular mass in the inner aspect of the right breast. There was nipple retraction and erythema around the nipple. The largest lymph node was noted in the right axilla. By ultrasound the large irregular hypoechoic mass was noted in the breast with multiple enlarged axillary lymph nodes. Physical exam confirmed a hard breast with erythema around the right nipple extending inferiorly. The nipple is slightly inverted.  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 Her subsequent history is as detailed below  INTERVAL HISTORY: Janice Powell returns today accompanied by her husband Janice Powell for follow up of her locally advanced right breast cancer. She is currently day 8 cycle 2 of 6 planned q. three-week doses of docetaxel/doxorubicin/cyclophosphamide today, given in the neoadjuvant setting. She declined Neulasta with cycle 2.  Janice Powell  tells me it has been a "terrible week". She's had difficulty sleeping. Her appetite is decreased, and she has a lot of taste aversion, although she denies any actual nausea or emesis. She is trying to keep herself well hydrated. She's had a few episodes of diarrhea which she was able to stop quickly with a dose of Imodium. She's had no abdominal cramping. There's been no blood or mucus in the stool. She denies any fevers, chills, or night sweats. She did have pain following treatment, but notes  that it has improved when compared to cycle 1. The pain was primarily in her back, but there was no pain in the lower extremities.   REVIEW OF SYSTEMS: Janice Powell  denies any rashes or skin changes. She's had no signs of abnormal bleeding. Her mouth feels dry and sore, but she's had no ulcerations. Her appetite has been reduced.  She denies any cough, increased shortness of breath, chest pain, or palpitations. She's had some occasional headaches, but no dizziness or change in vision. She's had no peripheral swelling and denies any signs of peripheral neuropathy in either the upper or lower extremities.  A detailed review of systems is otherwise noncontributory.    PAST MEDICAL HISTORY: Past Medical History  Diagnosis Date  . Hypertension   . Cancer     right breast  . Pneumonia     hx  . GERD (gastroesophageal reflux disease)     occ  GERD osteopenia  PAST SURGICAL HISTORY: Past Surgical History  Procedure Laterality Date  . Appendectomy  1992  . Tubal ligation  1979  . Cholecystectomy  2000  . Portacath placement  08/15/2012    Procedure: INSERTION PORT-A-CATH;  Surgeon: Ernestene Mention, MD;  Location: [facility] OR;  Service: General;  Laterality: Left;    FAMILY HISTORY Family History  Problem Relation Age of Onset  . Heart disease Mother    the patient's father died in his sleep at age 60. The patient's mother died at the age of 71 from a myocardial infarction. The patient has one brother and 2 sisters, all surviving. There is  no history of breast or ovarian cancer in the family.  GYNECOLOGIC HISTORY: Menarche age 83, first live birth age 12, she is GX P5, menopause age 61. She did not take hormone replacement.  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 Her husband Janice Powell worked in the post office 41 years, and is also now retired. He is a former Arts development officer. Son Janice Powell lives in [place] and manages an [employer] store.  Daughter Janice Powell also lives in [place] and manages the IT Department at [employer]. Son Janice Powell is a Therapist, sports. Daughter Janice Powell is a Doctor, hospital for [employer]. Son Janice Powell is an Teaching laboratory technician. The patient has 8 grandchildren. She attends the white [employer]   ADVANCED DIRECTIVES: Not in place  HEALTH MAINTENANCE: History  Substance Use Topics  . Smoking status: Never Smoker   . Smokeless tobacco: Not on file  . Alcohol Use: No     Colonoscopy:  PAP:  Bone density:  January 2011  Lipid panel:  No Known Allergies  Current Outpatient Prescriptions  Medication Sig Dispense Refill  . dexamethasone (DECADRON) 4 MG tablet Take 2 tablets by mouth twice a day as directed  30 tablet  3  . HYDROcodone-acetaminophen (NORCO/VICODIN) 5-325 MG per tablet Take 1-2 tablets by mouth every 4 (four) hours as needed for pain.  50 tablet  1  . lidocaine-prilocaine (EMLA) cream Apply 1 application topically as needed.       Marland Kitchen lisinopril (PRINIVIL) 10 MG tablet Take 1 tablet (10 mg total) by mouth daily.  30 tablet  12  . loperamide (IMODIUM) 2 MG capsule Take 1 capsule (2 mg total) by mouth 4 (four) times daily as needed for diarrhea or loose stools.  12 capsule  0  . LORazepam (ATIVAN) 0.5 MG tablet Take 1 tablet (0.5 mg total) by mouth at bedtime as needed for anxiety.  30 tablet  0  . metoprolol succinate (TOPROL XL) 50 MG 24 hr tablet Take 1 tablet (50 mg total) by mouth daily. Take with or immediately following a meal.  30 tablet  12  . ondansetron (ZOFRAN) 8 MG tablet Take 1 tablet (8 mg total) by mouth every 12 (twelve) hours as needed for nausea.  30 tablet  1  . prochlorperazine (COMPAZINE) 10 MG tablet Take 10 mg by mouth every 6 (six) hours as needed. Nausea      . ciprofloxacin (CIPRO) 500 MG tablet Take 1 tablet (500 mg total) by mouth 2 (two) times daily.  14 tablet  0  . fluconazole (DIFLUCAN) 100 MG tablet Take 1 tablet (100 mg total) by mouth daily.   7 tablet  0   No current facility-administered medications for this visit.    OBJECTIVE: Middle-aged [place] American woman in no acute distress Filed Vitals:   09/20/12 1342  BP: 180/92  Pulse: 76  Temp: 98.5 F (36.9 C)  Resp: 20     Body mass index is 23.3 kg/(m^2).    ECOG FS: 1 Filed Weights   09/20/12 1342  Weight: 131 lb 8 oz (59.648 kg)   Sclerae unicteric Oropharynx clear No cervical or supraclavicular adenopathy palpated Lungs clear to auscultation no rales or rhonchi,  Heart regular rate and rhythm Abdomen  soft, nontender, positive bowel sounds MSK no focal spinal tenderness No peripheral edema Neuro: nonfocal, well oriented, with anxious affect Breasts:  The mass effects the central portion of the right breast.  The breast itself is still very firm.  There is no obvious oozing from the lesion, and the llesion covering the areolar region is very dark, dry and necrotic with obvious improvement when compared to prior exams.  LAB RESULTS: CBC    Component Value Date/Time   WBC 0.9* 09/20/2012 1313   WBC 5.1 09/01/2012 0319   RBC 3.74 09/20/2012 1313   RBC 4.19 09/01/2012 0319   HGB 11.7 09/20/2012 1313   HGB 12.9 09/01/2012 0502   HCT 35.4 09/20/2012 1313   HCT 38.0 09/01/2012 0502   PLT 212 09/20/2012 1313   PLT 164 09/01/2012 0319   MCV 94.7 09/20/2012 1313   MCV 91.9 09/01/2012 0319   MCH 31.3 09/20/2012 1313   MCH 30.8 09/01/2012 0319   MCHC 33.1 09/20/2012 1313   MCHC 33.5 09/01/2012 0319   RDW 14.5 09/20/2012 1313   RDW 13.6 09/01/2012 0319   LYMPHSABS 0.6* 09/20/2012 1313   LYMPHSABS 0.9 09/01/2012 0319   MONOABS 0.0* 09/20/2012 1313   MONOABS 0.7 09/01/2012 0319   EOSABS 0.0 09/20/2012 1313   EOSABS 0.1 09/01/2012 0319   BASOSABS 0.0 09/20/2012 1313   BASOSABS 0.1 09/01/2012 0319        Chemistry      Component Value Date/Time   NA 142 09/20/2012 1313   NA 139 09/01/2012 0502      Component Value Date/Time   CALCIUM 8.9 09/20/2012 1313   CALCIUM 8.6 09/01/2012 0319        Lab Results  Component Value Date   LABCA2 91* 09/13/2012     STUDIES:  Echocardiogram on 08/19/2012 showed an ejection fraction of 60-65%.   ASSESSMENT: 67 y.o. [place] woman   (1)  s/p Right breast and axillary node biopsy 03/08/2012 for a clinical T4, N1-2', stage Powell invasive ductal carcinoma, grade 3, triple negative, with an MIB-1 of 93%.  (2)  being treated in the neoadjuvant setting, the original goal being to complete 6 cycles of docetaxel/doxorubicin/cyclophosphamide given every 3 weeks, with patient declining Neulasta injections after cycle 1.  Docetaxel is being dropped beginning cycle 3 due to poor tolerance.  (3) hypertension, poorly controlled  PLAN: This case was reviewed with Dr. Darnelle Catalan.  The patient continues to have a difficult time tolerating her chemotherapy, and he has suggested that we dropped the docetaxel with cycle 3, treating with cyclophosphamide/doxorubicin alone when she returns on March 11. We would then repeat her breast MRI which will be scheduled approximately March 19. She's already scheduled to see Dr. Derrell Lolling in March 25 and they can discuss definitive surgery at that time. She'll see Dr. Darnelle Catalan soon thereafter on April 1 to discuss her treatment plan. Janice Powell is willing to try a third cycle, but says "this will be it".  In the meanwhile, I am starting her on Cipro prophylactically at 500 mg by mouth twice a day for neutropenia. We reviewed neutropenic percussion, and she knows to call with any fevers of 100 or above. Also starting her on Diflucan, 100 mg daily for 7 days for oral candidiasis. Hopefully, if we can get that cleared up, and will also help her appetite and decrease taste aversion.  Janice Powell voices understanding and agreement with our plan, noticed called any changes or problems.    Janice Powell    09/20/2012

## 2012-09-21 ENCOUNTER — Telehealth: Payer: Self-pay | Admitting: Oncology

## 2012-09-21 NOTE — Telephone Encounter (Signed)
SENT MICHELLE A STAFF MESSAGE TO R/S THE PT'S CHEMO APPT ON 4/1 2014

## 2012-09-22 ENCOUNTER — Other Ambulatory Visit: Payer: Self-pay | Admitting: *Deleted

## 2012-09-22 ENCOUNTER — Telehealth: Payer: Self-pay | Admitting: Oncology

## 2012-09-22 ENCOUNTER — Telehealth: Payer: Self-pay | Admitting: *Deleted

## 2012-09-22 NOTE — Telephone Encounter (Signed)
lmonvm advising the pt of her April appts that have been re-arrainged based on the md's schedule

## 2012-09-22 NOTE — Telephone Encounter (Signed)
REQUEST FOR A 90 DAY SUPPLY ON LISINOPRIL. THIS REQUEST WAS GRANTED.

## 2012-09-22 NOTE — Telephone Encounter (Signed)
Per staff message and POF I have scheduled appts.  JMW  

## 2012-09-23 ENCOUNTER — Encounter (INDEPENDENT_AMBULATORY_CARE_PROVIDER_SITE_OTHER): Payer: Self-pay

## 2012-09-23 NOTE — Progress Notes (Signed)
We received a request for refill on Hydrocodone/ Acetaminophen 5/325 take 1 to 2 po q 4 hrs as needed for pain.  I paged Dr Derrell Lolling and he said ok to give # 20 and no refill without ov.  I faxed this back to [facility]  [phone].

## 2012-09-26 ENCOUNTER — Other Ambulatory Visit: Payer: Self-pay | Admitting: Physician Assistant

## 2012-09-26 ENCOUNTER — Telehealth: Payer: Self-pay | Admitting: *Deleted

## 2012-09-26 NOTE — Progress Notes (Signed)
I return the patient's phone call, having received a message from her earlier today.  She declines additional chemotherapy on March 11, and would like to cancel her labs, followup, and chemotherapy appointment accordingly. She is already scheduled for breast MRI the following week on March 19, and would like to come in to see [place] for labs and followup later that afternoon. Accordingly, I am moving her lab and followup appointment from 10/11/2012 to 10/12/2012.  POF has been sent.  This plan was repeated to the patient who voices agreement. I encouraged her to call with any changes or problems.  Zollie Scale, PA-C 09/26/2012

## 2012-09-26 NOTE — Telephone Encounter (Signed)
Message left by pt stating request for a return call from AB/PA or " one of her nurses ".  This RN returned call to pt with pt stating " I really would like to have Amy call me ".   This RN informed pt AB/PA was currently with pts and may be able to return call inbetween them or later in day. This RN verified with pt concern is of non urgent nature including fever with pt stating " it's not urgent".  This note with pt's return call number will be given to AB/PA.  Pt's return call number is [phone].

## 2012-09-29 ENCOUNTER — Telehealth: Payer: Self-pay | Admitting: Oncology

## 2012-09-29 NOTE — Telephone Encounter (Signed)
S/w the pt and she is aware of her r/s appts from 10/11/2012 to 10/12/2012@2 :15pm.

## 2012-10-03 ENCOUNTER — Other Ambulatory Visit: Payer: Self-pay | Admitting: *Deleted

## 2012-10-03 MED ORDER — HYDROCODONE-ACETAMINOPHEN 5-325 MG PO TABS
1.0000 | ORAL_TABLET | ORAL | Status: DC | PRN
Start: 2012-10-03 — End: 2013-01-20

## 2012-10-04 ENCOUNTER — Ambulatory Visit: Payer: Medicare Other

## 2012-10-04 ENCOUNTER — Other Ambulatory Visit: Payer: Medicare Other | Admitting: Lab

## 2012-10-04 ENCOUNTER — Ambulatory Visit: Payer: Medicare Other | Admitting: Physician Assistant

## 2012-10-05 ENCOUNTER — Ambulatory Visit: Payer: Medicare Other

## 2012-10-11 ENCOUNTER — Other Ambulatory Visit: Payer: Medicare Other | Admitting: Lab

## 2012-10-11 ENCOUNTER — Ambulatory Visit: Payer: Medicare Other | Admitting: Physician Assistant

## 2012-10-12 ENCOUNTER — Telehealth: Payer: Self-pay | Admitting: Oncology

## 2012-10-12 ENCOUNTER — Other Ambulatory Visit (HOSPITAL_BASED_OUTPATIENT_CLINIC_OR_DEPARTMENT_OTHER): Payer: Medicare Other | Admitting: Lab

## 2012-10-12 ENCOUNTER — Ambulatory Visit (HOSPITAL_BASED_OUTPATIENT_CLINIC_OR_DEPARTMENT_OTHER): Payer: Medicare Other | Admitting: Physician Assistant

## 2012-10-12 ENCOUNTER — Encounter: Payer: Self-pay | Admitting: Physician Assistant

## 2012-10-12 ENCOUNTER — Ambulatory Visit (HOSPITAL_COMMUNITY): Payer: Medicare Other

## 2012-10-12 VITALS — BP 191/79 | HR 66 | Temp 98.8°F | Resp 20 | Ht 63.0 in | Wt 133.5 lb

## 2012-10-12 DIAGNOSIS — Z171 Estrogen receptor negative status [ER-]: Secondary | ICD-10-CM | POA: Diagnosis not present

## 2012-10-12 DIAGNOSIS — C50911 Malignant neoplasm of unspecified site of right female breast: Secondary | ICD-10-CM

## 2012-10-12 DIAGNOSIS — C773 Secondary and unspecified malignant neoplasm of axilla and upper limb lymph nodes: Secondary | ICD-10-CM | POA: Diagnosis not present

## 2012-10-12 DIAGNOSIS — C50319 Malignant neoplasm of lower-inner quadrant of unspecified female breast: Secondary | ICD-10-CM

## 2012-10-12 DIAGNOSIS — C50919 Malignant neoplasm of unspecified site of unspecified female breast: Secondary | ICD-10-CM

## 2012-10-12 LAB — CBC WITH DIFFERENTIAL/PLATELET
EOS%: 0.3 % (ref 0.0–7.0)
LYMPH%: 18.8 % (ref 14.0–49.7)
MCH: 31.3 pg (ref 25.1–34.0)
MCV: 95.1 fL (ref 79.5–101.0)
MONO%: 12.4 % (ref 0.0–14.0)
RBC: 3.73 10*6/uL (ref 3.70–5.45)
RDW: 16.3 % — ABNORMAL HIGH (ref 11.2–14.5)

## 2012-10-12 NOTE — Progress Notes (Signed)
ID: Janice Powell   DOB: 01-25-1946  MR#: [id number]  CSN#:[id number]  PCP: Londell Moh, MD GYN:  SUClaud Kelp MD OTHER MD:   HISTORY OF PRESENT ILLNESS: [name] noted a mass in her right breast sometime in 2011. 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 More recently her husband twisted her arm to get the mass looked at, and she brought it to Dr. Carolee Rota attention. Diagnostic mammography and right ultrasonography at [facility] 03/02/2012 showed a 6.78 cm irregular mass in the inner aspect of the right breast. There was nipple retraction and erythema around the nipple. The largest lymph node was noted in the right axilla. By ultrasound the large irregular hypoechoic mass was noted in the breast with multiple enlarged axillary lymph nodes. Physical exam confirmed a hard breast with erythema around the right nipple extending inferiorly. The nipple is slightly inverted.  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 Her subsequent history is as detailed below  INTERVAL HISTORY: Janice Powell returns today for follow up of her locally advanced right breast cancer. She was actually due for day 1 cycle 3 of neoadjuvant chemotherapy last week on March 11, but call to cancel that appointment. She was scheduled for an MRI earlier today, but also canceled that appointment. She's here today to review her treatment plan.  As a recap, Janice Powell completed 2 neoadjuvant cycles of q. three-week docetaxel/doxorubicin/cyclophosphamide with very poor tolerance. She declined Neulasta following cycle 2. The plan was to hold docetaxel beginning with cycle 3. She is very   hesitant to continue chemotherapy due to her poor tolerance, primarily due to nausea and diarrhea. Looking back at our notes on day 8 cycle 2, however, it does seem that she double better with cycle 2 than with cycle 1.    REVIEW OF SYSTEMS: Janice Powell  had no fevers or chills. She  denies any rashes or skin changes. She's had no signs of abnormal bleeding or bruising. Her appetite has  improved, and currently she denies any problems with nausea, emesis, diarrhea, or constipation.  She denies any cough, increased shortness of breath, chest pain, or palpitations. She's had some occasional headaches, but no dizziness or change in vision. She's had no peripheral swelling and denies any signs of peripheral neuropathy in either the upper or lower extremities.    A detailed review of systems is otherwise noncontributory.    PAST MEDICAL HISTORY: Past Medical History  Diagnosis Date  . Hypertension   . Cancer     right breast  . Pneumonia     hx  . GERD (gastroesophageal reflux disease)     occ  GERD osteopenia  PAST SURGICAL HISTORY: Past Surgical History  Procedure Laterality Date  . Appendectomy  1992  . Tubal ligation  1979  . Cholecystectomy  2000  . Portacath placement  08/15/2012    Procedure: INSERTION PORT-A-CATH;  Surgeon: Ernestene Mention, MD;  Location: [facility] OR;  Service: General;  Laterality: Left;    FAMILY HISTORY Family History  Problem Relation Age of Onset  . Heart disease Mother    the patient's father died in his sleep at age 15. The patient's mother died at the age of 66 from a myocardial infarction. The patient has one brother and 2 sisters, all surviving. There is no history of breast or ovarian cancer in the family.  GYNECOLOGIC HISTORY: Menarche age  14, first live birth age 66, she is GX P5, menopause age 58. She did not take hormone replacement.  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 Her husband Janice Powell worked in the post office 41 years, and is also now retired. He is a former Arts development officer. Son Janice Powell lives in [place] and manages an [employer] store. Daughter Janice Powell also lives in [place] and manages the IT  Department at [employer]. Son Janice Powell is a Therapist, sports. Daughter Janice Powell is a Doctor, hospital for [employer]. Son [name] is an Teaching laboratory technician. The patient has 8 grandchildren. She attends the white [employer]   ADVANCED DIRECTIVES: Not in place  HEALTH MAINTENANCE: History  Substance Use Topics  . Smoking status: Never Smoker   . Smokeless tobacco: Not on file  . Alcohol Use: No     Colonoscopy:  PAP:  Bone density:  January 2011  Lipid panel:  No Known Allergies  Current Outpatient Prescriptions  Medication Sig Dispense Refill  . dexamethasone (DECADRON) 4 MG tablet Take 2 tablets by mouth twice a day as directed  30 tablet  3  . lidocaine-prilocaine (EMLA) cream Apply 1 application topically as needed.       Marland Kitchen lisinopril (PRINIVIL) 10 MG tablet Take 1 tablet (10 mg total) by mouth daily.  30 tablet  12  . loperamide (IMODIUM) 2 MG capsule Take 1 capsule (2 mg total) by mouth 4 (four) times daily as needed for diarrhea or loose stools.  12 capsule  0  . LORazepam (ATIVAN) 0.5 MG tablet Take 1 tablet (0.5 mg total) by mouth at bedtime as needed for anxiety.  30 tablet  0  . metoprolol succinate (TOPROL XL) 50 MG 24 hr tablet Take 1 tablet (50 mg total) by mouth daily. Take with or immediately following a meal.  30 tablet  12  . prochlorperazine (COMPAZINE) 10 MG tablet Take 10 mg by mouth every 6 (six) hours as needed. Nausea      . ciprofloxacin (CIPRO) 500 MG tablet Take 1 tablet (500 mg total) by mouth 2 (two) times daily.  14 tablet  0  . fluconazole (DIFLUCAN) 100 MG tablet Take 1 tablet (100 mg total) by mouth daily.  7 tablet  0  . HYDROcodone-acetaminophen (NORCO/VICODIN) 5-325 MG per tablet Take 1-2 tablets by mouth every 4 (four) hours as needed for pain.  50 tablet  1  . ondansetron (ZOFRAN) 8 MG tablet Take 1 tablet (8 mg total) by mouth every 12 (twelve) hours as needed for nausea.  30 tablet  1   No current facility-administered medications for  this visit.    OBJECTIVE: Middle-aged [place] American woman who appears anxious but is in no acute distress Filed Vitals:   10/12/12 1415  BP: 191/79  Pulse: 66  Temp: 98.8 F (37.1 C)  Resp: 20     Body mass index is 23.65 kg/(m^2).    ECOG FS: 1 Filed Weights   10/12/12 1415  Weight: 133 lb 8 oz (60.555 kg)   Sclerae unicteric Oropharynx clear No cervical or supraclavicular adenopathy palpated Lungs clear to auscultation no rales or rhonchi,  Heart regular rate and rhythm Abdomen  soft, nontender, positive bowel sounds MSK no focal spinal tenderness No peripheral edema Neuro: nonfocal, well oriented, with anxious affect Breasts:  The mass in the central portion of the right breast appear softer, less fixed. The lesion covering the areolar area  is becoming more necrotic, with evidence of obvious response.  There is no obvious oozing or bleeding from the lesion.  Left breast is unremarkable.  LAB RESULTS: CBC    Component Value Date/Time   WBC 7.2 10/12/2012 1341   WBC 5.1 09/01/2012 0319   RBC 3.73 10/12/2012 1341   RBC 4.19 09/01/2012 0319   HGB 11.7 10/12/2012 1341   HGB 12.9 09/01/2012 0502   HCT 35.5 10/12/2012 1341   HCT 38.0 09/01/2012 0502   PLT 212 10/12/2012 1341   PLT 164 09/01/2012 0319   MCV 95.1 10/12/2012 1341   MCV 91.9 09/01/2012 0319   MCH 31.3 10/12/2012 1341   MCH 30.8 09/01/2012 0319   MCHC 32.9 10/12/2012 1341   MCHC 33.5 09/01/2012 0319   RDW 16.3* 10/12/2012 1341   RDW 13.6 09/01/2012 0319   LYMPHSABS 1.4 10/12/2012 1341   LYMPHSABS 0.9 09/01/2012 0319   MONOABS 0.9 10/12/2012 1341   MONOABS 0.7 09/01/2012 0319   EOSABS 0.0 10/12/2012 1341   EOSABS 0.1 09/01/2012 0319   BASOSABS 0.0 10/12/2012 1341   BASOSABS 0.1 09/01/2012 0319        Chemistry      Component Value Date/Time   NA 142 09/20/2012 1313   NA 139 09/01/2012 0502      Component Value Date/Time   CALCIUM 8.9 09/20/2012 1313   CALCIUM 8.6 09/01/2012 0319       Lab Results  Component Value Date    LABCA2 91* 09/13/2012     STUDIES:  Echocardiogram on 08/19/2012 showed an ejection fraction of 60-65%.   ASSESSMENT: 67 y.o. [place] woman   (1)  s/p Right breast and axillary node biopsy 03/08/2012 for a clinical T4, N1-2', stage Powell invasive ductal carcinoma, grade 3, triple negative, with an MIB-1 of 93%.  (2)  being treated in the neoadjuvant setting, the original goal being to complete 6 cycles of docetaxel/doxorubicin/cyclophosphamide given every 3 weeks, with patient declining Neulasta injections after cycle 1.  Docetaxel is being dropped beginning cycle 3 due to poor tolerance.  (3) hypertension, poorly controlled  PLAN: Over half of our 50 minute appointment today was spent counseling the patient regarding her diagnosis, prognosis, and treatment plan, as well as coordinating care. This case was reviewed with Dr. Darnelle Catalan who also spoke to an exam and the patient today. She does appear to be having a very positive response to chemotherapy thus far, after only 2 cycles. He encouraged her to try continuing with cycle 3, receiving only doxorubicin/cyclophosphamide, and holding docetaxel.  After some hesitancy, Ayssa agreed to proceed with cycle 3, and we have scheduled that for next week, per her request, on March 26, following her appointment with Dr. Derrell Lolling on March 25. We will schedule a repeat breast MRI approximately March 31, prior to her appointment with Dr. Darnelle Catalan on April 1. At that point they will review her tolerance of the [facility], review her response to chemotherapy, and discuss her treatment plan.   If she tolerates this regimen well, our plan would be to complete 4 dose dense cycles of doxorubicin/cyclophosphamide, for total of 6 neoadjuvant cycles altogether.  Kaleisha voices understanding and agreement with our plan, noticed called any changes or problems.    Miyah Hampshire    10/12/2012

## 2012-10-12 NOTE — Telephone Encounter (Signed)
gv pt appt schedule for March thru May. Pt aware central will call re mri.

## 2012-10-18 ENCOUNTER — Encounter (INDEPENDENT_AMBULATORY_CARE_PROVIDER_SITE_OTHER): Payer: Self-pay | Admitting: General Surgery

## 2012-10-18 ENCOUNTER — Ambulatory Visit (INDEPENDENT_AMBULATORY_CARE_PROVIDER_SITE_OTHER): Payer: Medicare Other | Admitting: General Surgery

## 2012-10-18 VITALS — BP 122/88 | HR 72 | Temp 98.4°F | Resp 12 | Ht 63.5 in | Wt 134.0 lb

## 2012-10-18 DIAGNOSIS — C50919 Malignant neoplasm of unspecified site of unspecified female breast: Secondary | ICD-10-CM | POA: Diagnosis not present

## 2012-10-18 DIAGNOSIS — C50911 Malignant neoplasm of unspecified site of right female breast: Secondary | ICD-10-CM

## 2012-10-18 NOTE — Patient Instructions (Signed)
Examination of the right breast reveals that the cancer is smaller and appears to be responding somewhat to the chemotherapy.  However,  it is still too large to do a clean mastectomy at this time.  Dr. Derrell Lolling strongly advises you to continue all 6 cycles of your chemotherapy  Return to see Dr. Derrell Lolling in 6 weeks to see how you are doing.  Our ultimate goal is to shrink the tumor as much as possible and to then do a mastectomy in hopes of completely curing  the cancer.

## 2012-10-18 NOTE — Progress Notes (Signed)
Patient ID: Janice Powell, female   DOB: [birth year], 67 y.o.   MRN: [id number] History:   This patient noticed a right breast mass in 2011. 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 More recently, she brought it to Janice Powell attention. Diagnostic mammography and right ultrasonography at [facility] 03/02/2012 showed a 6.7 cm irregular mass in the inner aspect of the right breast. There was nipple retraction and erythema around the nipple. A large lymph node was noted in the right axilla. By ultrasound the large irregular hypoechoic mass was noted in the breast with multiple enlarged axillary lymph nodes. Physical exam confirmed a hard breast with erythema around the right nipple extending inferiorly. The nipple is slightly inverted. Palpable axillary nodes were noted.  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 Her subsequent history is detailed below  She had previously been evaluated by Janice Powell on Aug. 30, 2013 and had declined any surgical intervention or other treatment.  She has declined breast MRI and her PET-CT to date.  She initially declined chemotherapy, but reconsidered And I placed a Port-A-Cath on 08/15/2012. She has been receiving neoadjuvant chemotherapy. She has now had 3 cycles of a planned 6 cycles. They think that she is responding. She is here today for an interval exam. Excises as the chemotherapy is hard with nausea and vomiting. She wants to know what my plans are surgically  Exam: Patient is lower and in no distress. Mental status normal. Husband is with her. Port-A-Cath site looks fine. Right breast is small. There is a centralized mass. There is a 4 cm eschar centrally. The nipple is essentially destroyed. There is skin changes in the central area around the eschar. I may feel a couple of lymph nodes in the right axilla but they are not bulky and  they are mobile. Neck: No adenopathy no masses no jugular distention Heart: Regular rate and rhythm. No ectopy Lungs: Clear to auscultation bilaterally  Assessment: Locally advanced cancer right breast, clinical stage T4, N2, stage III, triple negative breast cancer Status post Port-A-Cath insertion Some response to neoadjuvant chemotherapy, but the tumor is still too large to do a mastectomy and get a clean margin with primary closure Hypertension GERD  Plan: I encouraged her to continue and to complete all 6 cycles of chemotherapy in hopes of further response Return to see me in 6 weeks. Hopefully we can perform a right modified radical mastectomy with primary skin closure at that time She is scheduled for a MRI of the breast on March 31.   Janice Powell. Janice Powell, M.D., [facility] General and Minimally invasive Surgery Breast and Colorectal Surgery Office:   [phone] Pager:   [phone]

## 2012-10-19 ENCOUNTER — Ambulatory Visit (HOSPITAL_BASED_OUTPATIENT_CLINIC_OR_DEPARTMENT_OTHER): Payer: Medicare Other

## 2012-10-19 ENCOUNTER — Ambulatory Visit (HOSPITAL_BASED_OUTPATIENT_CLINIC_OR_DEPARTMENT_OTHER): Payer: Medicare Other | Admitting: Physician Assistant

## 2012-10-19 ENCOUNTER — Telehealth: Payer: Self-pay | Admitting: *Deleted

## 2012-10-19 ENCOUNTER — Other Ambulatory Visit (HOSPITAL_BASED_OUTPATIENT_CLINIC_OR_DEPARTMENT_OTHER): Payer: Medicare Other | Admitting: Lab

## 2012-10-19 ENCOUNTER — Other Ambulatory Visit: Payer: Self-pay | Admitting: Physician Assistant

## 2012-10-19 ENCOUNTER — Encounter: Payer: Self-pay | Admitting: Physician Assistant

## 2012-10-19 VITALS — BP 197/84 | HR 72 | Temp 98.6°F | Resp 20 | Ht 63.0 in | Wt 133.7 lb

## 2012-10-19 DIAGNOSIS — C50919 Malignant neoplasm of unspecified site of unspecified female breast: Secondary | ICD-10-CM

## 2012-10-19 DIAGNOSIS — Z5111 Encounter for antineoplastic chemotherapy: Secondary | ICD-10-CM

## 2012-10-19 DIAGNOSIS — E876 Hypokalemia: Secondary | ICD-10-CM

## 2012-10-19 DIAGNOSIS — C801 Malignant (primary) neoplasm, unspecified: Secondary | ICD-10-CM

## 2012-10-19 DIAGNOSIS — C50911 Malignant neoplasm of unspecified site of right female breast: Secondary | ICD-10-CM

## 2012-10-19 DIAGNOSIS — Z171 Estrogen receptor negative status [ER-]: Secondary | ICD-10-CM | POA: Diagnosis not present

## 2012-10-19 LAB — COMPREHENSIVE METABOLIC PANEL (CC13)
ALT: 15 U/L (ref 0–55)
AST: 15 U/L (ref 5–34)
CO2: 28 mEq/L (ref 22–29)
Creatinine: 0.7 mg/dL (ref 0.6–1.1)
Sodium: 143 mEq/L (ref 136–145)
Total Bilirubin: 0.57 mg/dL (ref 0.20–1.20)
Total Protein: 6.7 g/dL (ref 6.4–8.3)

## 2012-10-19 LAB — CBC WITH DIFFERENTIAL/PLATELET
BASO%: 0.3 % (ref 0.0–2.0)
Eosinophils Absolute: 0 10*3/uL (ref 0.0–0.5)
MCHC: 32.5 g/dL (ref 31.5–36.0)
MCV: 95.9 fL (ref 79.5–101.0)
MONO#: 0.2 10*3/uL (ref 0.1–0.9)
MONO%: 2.8 % (ref 0.0–14.0)
NEUT#: 6.2 10*3/uL (ref 1.5–6.5)
RBC: 4.14 10*6/uL (ref 3.70–5.45)
RDW: 15.9 % — ABNORMAL HIGH (ref 11.2–14.5)
WBC: 7.1 10*3/uL (ref 3.9–10.3)
nRBC: 0 % (ref 0–0)

## 2012-10-19 MED ORDER — HEPARIN SOD (PORK) LOCK FLUSH 100 UNIT/ML IV SOLN
500.0000 [IU] | Freq: Once | INTRAVENOUS | Status: DC | PRN
  Filled 2012-10-19: qty 5

## 2012-10-19 MED ORDER — PALONOSETRON HCL INJECTION 0.25 MG/5ML
0.2500 mg | Freq: Once | INTRAVENOUS | Status: AC
  Administered 2012-10-19: 0.25 mg via INTRAVENOUS

## 2012-10-19 MED ORDER — SODIUM CHLORIDE 0.9 % IV SOLN
Freq: Once | INTRAVENOUS | Status: AC
  Administered 2012-10-19: 12:00:00 via INTRAVENOUS

## 2012-10-19 MED ORDER — POTASSIUM CHLORIDE CRYS ER 20 MEQ PO TBCR: 20.0000 meq | EXTENDED_RELEASE_TABLET | Freq: Two times a day (BID) | ORAL | Status: DC

## 2012-10-19 MED ORDER — LORAZEPAM 2 MG/ML IJ SOLN
0.5000 mg | Freq: Once | INTRAMUSCULAR | Status: AC
  Administered 2012-10-19: 0.5 mg via INTRAVENOUS

## 2012-10-19 MED ORDER — SODIUM CHLORIDE 0.9 % IV SOLN
500.0000 mg/m2 | Freq: Once | INTRAVENOUS | Status: AC
  Administered 2012-10-19: 840 mg via INTRAVENOUS
  Filled 2012-10-19: qty 42

## 2012-10-19 MED ORDER — SODIUM CHLORIDE 0.9 % IJ SOLN
10.0000 mL | INTRAMUSCULAR | Status: DC | PRN
  Filled 2012-10-19: qty 10

## 2012-10-19 MED ORDER — DEXAMETHASONE SODIUM PHOSPHATE 4 MG/ML IJ SOLN
12.0000 mg | Freq: Once | INTRAMUSCULAR | Status: AC
  Administered 2012-10-19: 12 mg via INTRAVENOUS

## 2012-10-19 MED ORDER — DOXORUBICIN HCL CHEMO IV INJECTION 2 MG/ML
50.0000 mg/m2 | Freq: Once | INTRAVENOUS | Status: AC
  Administered 2012-10-19: 84 mg via INTRAVENOUS
  Filled 2012-10-19: qty 42

## 2012-10-19 MED ORDER — SODIUM CHLORIDE 0.9 % IV SOLN
150.0000 mg | Freq: Once | INTRAVENOUS | Status: AC
  Administered 2012-10-19: 150 mg via INTRAVENOUS
  Filled 2012-10-19: qty 5

## 2012-10-19 NOTE — Patient Instructions (Addendum)
[facility] Discharge Instructions for Patients Receiving Chemotherapy  Today you received the following chemotherapy agents: Adriamycin/cytoxan.  To help prevent nausea and vomiting after your treatment, we encourage you to take your nausea medication as prescribed.   If you develop nausea and vomiting that is not controlled by your nausea medication, call the clinic.  BELOW ARE SYMPTOMS THAT SHOULD BE REPORTED IMMEDIATELY:  *FEVER GREATER THAN 100.5 F  *CHILLS WITH OR WITHOUT FEVER  NAUSEA AND VOMITING THAT IS NOT CONTROLLED WITH YOUR NAUSEA MEDICATION  *UNUSUAL SHORTNESS OF BREATH  *UNUSUAL BRUISING OR BLEEDING  TENDERNESS IN MOUTH AND THROAT WITH OR WITHOUT PRESENCE OF ULCERS  *URINARY PROBLEMS  *BOWEL PROBLEMS  UNUSUAL RASH Items with * indicate a potential emergency and should be followed up as soon as possible.  The clinic phone number is [phone].

## 2012-10-19 NOTE — Telephone Encounter (Signed)
appts made and printed 

## 2012-10-19 NOTE — Telephone Encounter (Signed)
Per staff phone call and POF I have schedueld appts.  JMW  

## 2012-10-19 NOTE — Progress Notes (Signed)
The patient's metabolic panel showed a low potassium of 3.0 today. She's already scheduled to return tomorrow for supportive IV fluids, and I am adding 20 mEq of potassium to be given IV. I am also starting her on oral potassium, 20 mEq twice daily.  We will reevaluate her metabolic panel which she returns next week.  Zollie Scale, PA-C 10/19/2012

## 2012-10-19 NOTE — Progress Notes (Signed)
ID: [name]   DOB: May 14, 1946  MR#: [id number]  CSN#:[id number]  PCP: Londell Moh, MD GYN:  SUClaud Kelp MD OTHER MD:   HISTORY OF PRESENT ILLNESS: [name] noted a mass in her right breast sometime in 2011. 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 More recently her husband twisted her arm to get the mass looked at, and she brought it to Dr. Carolee Rota attention. Diagnostic mammography and right ultrasonography at [facility] 03/02/2012 showed a 6.78 cm irregular mass in the inner aspect of the right breast. There was nipple retraction and erythema around the nipple. The largest lymph node was noted in the right axilla. By ultrasound the large irregular hypoechoic mass was noted in the breast with multiple enlarged axillary lymph nodes. Physical exam confirmed a hard breast with erythema around the right nipple extending inferiorly. The nipple is slightly inverted.  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 Her subsequent history is as detailed below  INTERVAL HISTORY: [name] returns today for follow up of her locally advanced right breast cancer. She is due for day 1 cycle 3 of neoadjuvant chemotherapy today. As previously discussed, our plan is to discontinue docetaxel due to poor tolerance, and proceed with doxorubicin/cyclophosphamide alone. I will mention that was previously declined Neulasta injection following cycle 2, but tells me she is willing to receive the injection tomorrow as recommended.  Interval history is remarkable for patient having been evaluated by her surgeon, Dr. Derrell Lolling, yesterday. He also recommended that she continue with her neoadjuvant chemotherapy, and will see her again at the end of her treatment to discuss definitive surgery.   REVIEW OF SYSTEMS: [name]  had no fevers or chills. She  denies any rashes or skin changes.  She's had no signs of abnormal bleeding or bruising. Her appetite has  improved, and currently she denies any problems with nausea, emesis, diarrhea, or constipation. She's had no mouth ulcers or oral sensitivity, and denies any problems swallowing. She denies any cough, increased shortness of breath, chest pain, or palpitations. She's had some occasional headaches, but no dizziness or change in vision. She's had no peripheral swelling and denies any signs of peripheral neuropathy in either the upper or lower extremities.  Currently, she also denies any unusual myalgias, arthralgias, or bony pain.  A detailed review of systems is otherwise noncontributory.    PAST MEDICAL HISTORY: Past Medical History  Diagnosis Date  . Hypertension   . Cancer     right breast  . Pneumonia     hx  . GERD (gastroesophageal reflux disease)     occ  GERD osteopenia  PAST SURGICAL HISTORY: Past Surgical History  Procedure Laterality Date  . Appendectomy  1992  . Tubal ligation  1979  . Cholecystectomy  2000  . Portacath placement  67/20/2014    Procedure: INSERTION PORT-A-CATH;  Surgeon: Ernestene Mention, MD;  Location: [facility] OR;  Service: General;  Laterality: Left;    FAMILY HISTORY Family History  Problem Relation Age of Onset  . Heart disease Mother    the patient's father died in his sleep at age 60. The patient's mother died at the age of 52 from a myocardial infarction. The patient has one brother and 2 sisters, all surviving. There is no history of breast or ovarian cancer in the family.  GYNECOLOGIC HISTORY: Menarche age 12, first live birth  age 89, she is GX P5, menopause age 67. She did not take hormone replacement.  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 Her husband Janice Powell worked in the post office 41 years, and is also now retired. He is a former Arts development officer. Son Janice Powell lives in [place] and manages an [employer] store. Daughter  Janice Powell also lives in [place] and manages the IT Department at [employer]. Son Janice Powell is a Therapist, sports. Daughter Janice Powell is a Doctor, hospital for [employer]. Son [name] is an Teaching laboratory technician. The patient has 8 grandchildren. She attends the white [employer]   ADVANCED DIRECTIVES: Not in place  HEALTH MAINTENANCE: History  Substance Use Topics  . Smoking status: Never Smoker   . Smokeless tobacco: Not on file  . Alcohol Use: No     Colonoscopy:  PAP:  Bone density:  January 2011  Lipid panel:  No Known Allergies  Current Outpatient Prescriptions  Medication Sig Dispense Refill  . dexamethasone (DECADRON) 4 MG tablet Take 2 tablets by mouth twice a day as directed  30 tablet  3  . lidocaine-prilocaine (EMLA) cream Apply 1 application topically as needed.       Marland Kitchen lisinopril (PRINIVIL) 10 MG tablet Take 1 tablet (10 mg total) by mouth daily.  30 tablet  12  . loperamide (IMODIUM) 2 MG capsule Take 1 capsule (2 mg total) by mouth 4 (four) times daily as needed for diarrhea or loose stools.  12 capsule  0  . LORazepam (ATIVAN) 0.5 MG tablet Take 1 tablet (0.5 mg total) by mouth at bedtime as needed for anxiety.  30 tablet  0  . metoprolol succinate (TOPROL XL) 50 MG 24 hr tablet Take 1 tablet (50 mg total) by mouth daily. Take with or immediately following a meal.  30 tablet  12  . ondansetron (ZOFRAN) 8 MG tablet Take 1 tablet (8 mg total) by mouth every 12 (twelve) hours as needed for nausea.  30 tablet  1  . prochlorperazine (COMPAZINE) 10 MG tablet Take 10 mg by mouth every 6 (six) hours as needed. Nausea      . HYDROcodone-acetaminophen (NORCO/VICODIN) 5-325 MG per tablet Take 1-2 tablets by mouth every 4 (four) hours as needed for pain.  50 tablet  1   No current facility-administered medications for this visit.    OBJECTIVE: Middle-aged [place] American woman who appears anxious but is in no acute distress Filed Vitals:   10/19/12 1021  BP: 197/84   Pulse: 72  Temp: 98.6 F (37 C)  Resp: 20     Body mass index is 23.69 kg/(m^2).    ECOG FS: 1 Filed Weights   10/19/12 1021  Weight: 133 lb 11.2 oz (60.646 kg)   Sclerae unicteric Oropharynx clear No cervical or supraclavicular adenopathy palpated Lungs clear to auscultation, no rales or rhonchi,  Heart regular rate and rhythm Abdomen  soft, nontender, positive bowel sounds MSK no focal spinal tenderness No peripheral edema Neuro: nonfocal, well oriented, with anxious affect Breasts:  Deferred.   LAB RESULTS: CBC    Component Value Date/Time   WBC 7.1 10/19/2012 1005   WBC 5.1 09/01/2012 0319   RBC 4.14 10/19/2012 1005   RBC 4.19 09/01/2012 0319   HGB 12.9 10/19/2012 1005   HGB 12.9 09/01/2012 0502   HCT 39.7 10/19/2012 1005   HCT 38.0 09/01/2012 0502   PLT 202 10/19/2012 1005   PLT  164 09/01/2012 0319   MCV 95.9 10/19/2012 1005   MCV 91.9 09/01/2012 0319   MCH 31.2 10/19/2012 1005   MCH 30.8 09/01/2012 0319   MCHC 32.5 10/19/2012 1005   MCHC 33.5 09/01/2012 0319   RDW 15.9* 10/19/2012 1005   RDW 13.6 09/01/2012 0319   LYMPHSABS 0.6* 10/19/2012 1005   LYMPHSABS 0.9 09/01/2012 0319   MONOABS 0.2 10/19/2012 1005   MONOABS 0.7 09/01/2012 0319   EOSABS 0.0 10/19/2012 1005   EOSABS 0.1 09/01/2012 0319   BASOSABS 0.0 10/19/2012 1005   BASOSABS 0.1 09/01/2012 0319        Chemistry      Component Value Date/Time   NA 142 09/20/2012 1313   NA 139 09/01/2012 0502      Component Value Date/Time   CALCIUM 8.9 09/20/2012 1313   CALCIUM 8.6 09/01/2012 0319       Lab Results  Component Value Date   LABCA2 91* 09/13/2012     STUDIES:  Echocardiogram on 08/19/2012 showed an ejection fraction of 60-65%.   ASSESSMENT: 67 y.o. [place] woman   (1)  s/p Right breast and axillary node biopsy 03/08/2012 for a clinical T4, N1-2', stage Powell invasive ductal carcinoma, grade 3, triple negative, with an MIB-1 of 93%.  (2)  being treated in the neoadjuvant setting, the original goal being to complete 6  cycles of docetaxel/doxorubicin/cyclophosphamide given every 3 weeks, with patient declining Neulasta injections after cycle 1.  Docetaxel is being dropped beginning cycle 3 due to poor tolerance.  (3) hypertension, poorly controlled  PLAN: Sonny will proceed to treatment today as scheduled 4 doxorubicin/cyclophosphamide. Again, as noted above, we will hold docetaxel at this time. She would like to add the Neulasta injection on day 2 (she had previously declined). I also think it would be helpful for her to receive some supportive IV fluids tomorrow, so we will schedule her for a liter of normal saline in addition to her Neulasta injection on 327.  She is already scheduled for a breast MRI next week on March 31 to assess response to therapy. She will see Dr. Darnelle Catalan on April 1 for assessment of chemotoxicity, and also to review those results.  If Kevyn tolerates her treatment well today, Dr. Darnelle Catalan will review her schedule with her next week, and we will likely be able to treat her on an every 2 week cycle of doxorubicin/cyclophosphamide. Our plan will be to complete a total of 6 cycles of neoadjuvant chemotherapy, consisting of 2 cycles of TAC, then 4 dose dense cycles of AC.  We again reviewed the possible chemotoxicities associated with this regimen. She has her antinausea medication on hand at home in voices understanding of how to utilize it appropriately. She'll call with any changes or problems.     Todd Argabright    10/19/2012

## 2012-10-20 ENCOUNTER — Ambulatory Visit (HOSPITAL_BASED_OUTPATIENT_CLINIC_OR_DEPARTMENT_OTHER): Payer: Medicare Other

## 2012-10-20 VITALS — BP 187/67 | HR 70 | Temp 98.3°F | Resp 20

## 2012-10-20 DIAGNOSIS — C801 Malignant (primary) neoplasm, unspecified: Secondary | ICD-10-CM

## 2012-10-20 DIAGNOSIS — C50911 Malignant neoplasm of unspecified site of right female breast: Secondary | ICD-10-CM

## 2012-10-20 DIAGNOSIS — C50919 Malignant neoplasm of unspecified site of unspecified female breast: Secondary | ICD-10-CM

## 2012-10-20 DIAGNOSIS — Z5189 Encounter for other specified aftercare: Secondary | ICD-10-CM

## 2012-10-20 MED ORDER — SODIUM CHLORIDE 0.9 % IV SOLN
INTRAVENOUS | Status: DC
  Administered 2012-10-20: 14:00:00 via INTRAVENOUS
  Filled 2012-10-20: qty 1000

## 2012-10-20 MED ORDER — SODIUM CHLORIDE 0.9 % IV SOLN
Freq: Once | INTRAVENOUS | Status: AC
  Administered 2012-10-20: 14:00:00 via INTRAVENOUS

## 2012-10-20 MED ORDER — PEGFILGRASTIM INJECTION 6 MG/0.6ML
6.0000 mg | Freq: Once | SUBCUTANEOUS | Status: AC
  Administered 2012-10-20: 6 mg via SUBCUTANEOUS
  Filled 2012-10-20: qty 0.6

## 2012-10-20 NOTE — Patient Instructions (Addendum)
Dehydration, Adult Dehydration is when you lose more fluids from the body than you take in. Vital organs like the kidneys, brain, and heart cannot function without a proper amount of fluids and salt. Any loss of fluids from the body can cause dehydration.  CAUSES   Vomiting.  Diarrhea.  Excessive sweating.  Excessive urine output.  Fever. SYMPTOMS  Mild dehydration  Thirst.  Dry lips.  Slightly dry mouth. Moderate dehydration  Very dry mouth.  Sunken eyes.  Skin does not bounce back quickly when lightly pinched and released.  Dark urine and decreased urine production.  Decreased tear production.  Headache. Severe dehydration  Very dry mouth.  Extreme thirst.  Rapid, weak pulse (more than 100 beats per minute at rest).  Cold hands and feet.  Not able to sweat in spite of heat and temperature.  Rapid breathing.  Blue lips.  Confusion and lethargy.  Difficulty being awakened.  Minimal urine production.  No tears. DIAGNOSIS  Your caregiver will diagnose dehydration based on your symptoms and your exam. Blood and urine tests will help confirm the diagnosis. The diagnostic evaluation should also identify the cause of dehydration. TREATMENT  Treatment of mild or moderate dehydration can often be done at home by increasing the amount of fluids that you drink. It is best to drink small amounts of fluid more often. Drinking too much at one time can make vomiting worse. Refer to the home care instructions below. Severe dehydration needs to be treated at the hospital where you will probably be given intravenous (IV) fluids that contain water and electrolytes. HOME CARE INSTRUCTIONS   Ask your caregiver about specific rehydration instructions.  Drink enough fluids to keep your urine clear or pale yellow.  Drink small amounts frequently if you have nausea and vomiting.  Eat as you normally do.  Avoid:  Foods or drinks high in sugar.  Carbonated  drinks.  Juice.  Extremely hot or cold fluids.  Drinks with caffeine.  Fatty, greasy foods.  Alcohol.  Tobacco.  Overeating.  Gelatin desserts.  Wash your hands well to avoid spreading bacteria and viruses.  Only take over-the-counter or prescription medicines for pain, discomfort, or fever as directed by your caregiver.  Ask your caregiver if you should continue all prescribed and over-the-counter medicines.  Keep all follow-up appointments with your caregiver. SEEK MEDICAL CARE IF:  You have abdominal pain and it increases or stays in one area (localizes).  You have a rash, stiff neck, or severe headache.  You are irritable, sleepy, or difficult to awaken.  You are weak, dizzy, or extremely thirsty. SEEK IMMEDIATE MEDICAL CARE IF:   You are unable to keep fluids down or you get worse despite treatment.  You have frequent episodes of vomiting or diarrhea.  You have blood or green matter (bile) in your vomit.  You have blood in your stool or your stool looks black and tarry.  You have not urinated in 6 to 8 hours, or you have only urinated a small amount of very dark urine.  You have a fever.  You faint. MAKE SURE YOU:   Understand these instructions.  Will watch your condition.  Will get help right away if you are not doing well or get worse. Document Released: 07/13/2005 Document Revised: 10/05/2011 Document Reviewed: 03/02/2011 Exit[employer] Patient Information 2013 [employer].  

## 2012-10-24 ENCOUNTER — Ambulatory Visit (HOSPITAL_COMMUNITY)
Admission: RE | Admit: 2012-10-24 | Discharge: 2012-10-24 | Disposition: A | Discharge status: Home / Self Care | Payer: Medicare Other | Source: Ambulatory Visit | Attending: Physician Assistant | Admitting: Physician Assistant

## 2012-10-24 DIAGNOSIS — R599 Enlarged lymph nodes, unspecified: Secondary | ICD-10-CM | POA: Diagnosis not present

## 2012-10-24 DIAGNOSIS — C50919 Malignant neoplasm of unspecified site of unspecified female breast: Secondary | ICD-10-CM | POA: Insufficient documentation

## 2012-10-24 DIAGNOSIS — C50911 Malignant neoplasm of unspecified site of right female breast: Secondary | ICD-10-CM

## 2012-10-24 MED ORDER — GADOBENATE DIMEGLUMINE 529 MG/ML IV SOLN
15.0000 mL | Freq: Once | INTRAVENOUS | Status: AC | PRN
  Administered 2012-10-24: 12 mL via INTRAVENOUS

## 2012-10-25 ENCOUNTER — Ambulatory Visit (HOSPITAL_BASED_OUTPATIENT_CLINIC_OR_DEPARTMENT_OTHER): Payer: Medicare Other | Admitting: Oncology

## 2012-10-25 ENCOUNTER — Telehealth: Payer: Self-pay | Admitting: *Deleted

## 2012-10-25 ENCOUNTER — Ambulatory Visit: Payer: Medicare Other

## 2012-10-25 ENCOUNTER — Other Ambulatory Visit (HOSPITAL_BASED_OUTPATIENT_CLINIC_OR_DEPARTMENT_OTHER): Payer: Medicare Other | Admitting: Lab

## 2012-10-25 VITALS — BP 193/76 | HR 68 | Temp 98.3°F | Resp 20 | Ht 63.0 in | Wt 132.3 lb

## 2012-10-25 DIAGNOSIS — C50319 Malignant neoplasm of lower-inner quadrant of unspecified female breast: Secondary | ICD-10-CM

## 2012-10-25 DIAGNOSIS — C50919 Malignant neoplasm of unspecified site of unspecified female breast: Secondary | ICD-10-CM

## 2012-10-25 DIAGNOSIS — C773 Secondary and unspecified malignant neoplasm of axilla and upper limb lymph nodes: Secondary | ICD-10-CM | POA: Diagnosis not present

## 2012-10-25 DIAGNOSIS — R031 Nonspecific low blood-pressure reading: Secondary | ICD-10-CM

## 2012-10-25 DIAGNOSIS — Z171 Estrogen receptor negative status [ER-]: Secondary | ICD-10-CM

## 2012-10-25 DIAGNOSIS — C50911 Malignant neoplasm of unspecified site of right female breast: Secondary | ICD-10-CM

## 2012-10-25 LAB — CBC WITH DIFFERENTIAL/PLATELET
Basophils Absolute: 0 10*3/uL (ref 0.0–0.1)
Eosinophils Absolute: 0.1 10*3/uL (ref 0.0–0.5)
HGB: 11.4 g/dL — ABNORMAL LOW (ref 11.6–15.9)
LYMPH%: 36.7 % (ref 14.0–49.7)
MCV: 96.6 fL (ref 79.5–101.0)
MONO%: 0.7 % (ref 0.0–14.0)
NEUT#: 1.4 10*3/uL — ABNORMAL LOW (ref 1.5–6.5)
Platelets: 123 10*3/uL — ABNORMAL LOW (ref 145–400)

## 2012-10-25 LAB — COMPREHENSIVE METABOLIC PANEL (CC13)
Albumin: 3.2 g/dL — ABNORMAL LOW (ref 3.5–5.0)
Alkaline Phosphatase: 71 U/L (ref 40–150)
BUN: 8.6 mg/dL (ref 7.0–26.0)
Glucose: 135 mg/dl — ABNORMAL HIGH (ref 70–99)
Potassium: 3.4 mEq/L — ABNORMAL LOW (ref 3.5–5.1)
Total Bilirubin: 0.85 mg/dL (ref 0.20–1.20)

## 2012-10-25 NOTE — Progress Notes (Signed)
ID: Janice Powell   DOB: 1946-05-29  MR#: [id number]  CSN#:[id number]  PCP: Londell Moh, MD GYN:  SUClaud Kelp MD OTHER MD:   HISTORY OF PRESENT ILLNESS: Janice Powell noted a mass in her right breast sometime in 2011. 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 More recently her husband twisted her arm to get the mass looked at, and she brought it to Dr. Carolee Rota attention. Diagnostic mammography and right ultrasonography at [facility] 03/02/2012 showed a 6.78 cm irregular mass in the inner aspect of the right breast. There was nipple retraction and erythema around the nipple. The largest lymph node was noted in the right axilla. By ultrasound the large irregular hypoechoic mass was noted in the breast with multiple enlarged axillary lymph nodes. Physical exam confirmed a hard breast with erythema around the right nipple extending inferiorly. The nipple is slightly inverted.  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 Her subsequent history is as detailed below  INTERVAL HISTORY: Janice Powell returns today for follow up of her locally advanced right breast cancer. Today is day 7 cycle 3 of her chemotherapy. She tolerated this third cycle much better, because we omitted the docetaxel. She is now agreeable to 3 additional cycles of doxorubicin/cyclophosphamide, without the docetaxel.  REVIEW OF SYSTEMS: Janice Powell  had no nausea or vomiting and no diarrhea this time. She had some pain from the Neulasta, and that lasted about 1 day. She sleeps poorly the night of chemotherapy. Otherwise a detailed review of systems today was remarkably benign.  PAST MEDICAL HISTORY: Past Medical History  Diagnosis Date  . Hypertension   . Cancer     right breast  . Pneumonia     hx  . GERD (gastroesophageal reflux disease)     occ  GERD osteopenia  PAST SURGICAL HISTORY: Past  Surgical History  Procedure Laterality Date  . Appendectomy  1992  . Tubal ligation  1979  . Cholecystectomy  2000  . Portacath placement  08/15/2012    Procedure: INSERTION PORT-A-CATH;  Surgeon: Ernestene Mention, MD;  Location: [facility] OR;  Service: General;  Laterality: Left;    FAMILY HISTORY Family History  Problem Relation Age of Onset  . Heart disease Mother    the patient's father died in his sleep at age 63. The patient's mother died at the age of 22 from a myocardial infarction. The patient has one brother and 2 sisters, all surviving. There is no history of breast or ovarian cancer in the family.  GYNECOLOGIC HISTORY: Menarche age 31, first live birth age 42, she is GX P5, menopause age 38. She did not take hormone replacement.  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 Her husband [name] worked in the post office 41 years, and is also now retired. He is a former Arts development officer. Son [name] lives in [place] and manages an [employer] store. Daughter Wilmina Maxham also lives in [place] and manages the IT Department at [employer]. Son Briell Paulette III is a Therapist, sports. Daughter [name] is a Doctor, hospital for [employer]. Son [name] is an Teaching laboratory technician. The patient has 8 grandchildren. She attends the white [employer]   ADVANCED DIRECTIVES: Not in place  HEALTH MAINTENANCE: History  Substance Use Topics  . Smoking status: Never Smoker   . Smokeless tobacco: Not  on file  . Alcohol Use: No     Colonoscopy:  PAP:  Bone density:  January 2011  Lipid panel:  No Known Allergies  Current Outpatient Prescriptions  Medication Sig Dispense Refill  . dexamethasone (DECADRON) 4 MG tablet Take 2 tablets by mouth twice a day as directed  30 tablet  3  . HYDROcodone-acetaminophen (NORCO/VICODIN) 5-325 MG per tablet Take 1-2 tablets by mouth every 4 (four) hours as needed for pain.  50 tablet  1  . lidocaine-prilocaine  (EMLA) cream Apply 1 application topically as needed.       Marland Kitchen lisinopril (PRINIVIL) 10 MG tablet Take 1 tablet (10 mg total) by mouth daily.  30 tablet  12  . loperamide (IMODIUM) 2 MG capsule Take 1 capsule (2 mg total) by mouth 4 (four) times daily as needed for diarrhea or loose stools.  12 capsule  0  . LORazepam (ATIVAN) 0.5 MG tablet Take 1 tablet (0.5 mg total) by mouth at bedtime as needed for anxiety.  30 tablet  0  . metoprolol succinate (TOPROL XL) 50 MG 24 hr tablet Take 1 tablet (50 mg total) by mouth daily. Take with or immediately following a meal.  30 tablet  12  . ondansetron (ZOFRAN) 8 MG tablet Take 1 tablet (8 mg total) by mouth every 12 (twelve) hours as needed for nausea.  30 tablet  1  . potassium chloride SA (K-DUR,KLOR-CON) 20 MEQ tablet Take 1 tablet (20 mEq total) by mouth 2 (two) times daily.  60 tablet  1  . prochlorperazine (COMPAZINE) 10 MG tablet Take 10 mg by mouth every 6 (six) hours as needed. Nausea       No current facility-administered medications for this visit.    OBJECTIVE: Middle-aged [place] American woman in no acute distress Filed Vitals:   10/25/12 1140  BP: 193/76  Pulse: 68  Temp: 98.3 F (36.8 C)  Resp: 20     Body mass index is 23.44 kg/(m^2).    ECOG FS: 1 Filed Weights   10/25/12 1140  Weight: 132 lb 4.8 oz (60.011 kg)   Sclerae unicteric Oropharynx clear No cervical or supraclavicular adenopathy  Lungs clear to auscultation, good excursion bilaterally Heart regular rate and rhythm Abdomen  soft, nontender, positive bowel sounds MSK no focal spinal tenderness No peripheral edema Neuro: nonfocal, well oriented, pleasant affect Breasts:  The right breast is considerably softer. The area of skin involvement now has a well demarcated eschar. The mass is movable. The right axilla still shows some palpable adenopathy, which appears fixed. It is smaller. The left breast is unremarkable.   LAB RESULTS: CBC    Component Value  Date/Time   WBC 2.4* 10/25/2012 1127   WBC 5.1 09/01/2012 0319   RBC 3.60* 10/25/2012 1127   RBC 4.19 09/01/2012 0319   HGB 11.4* 10/25/2012 1127   HGB 12.9 09/01/2012 0502   HCT 34.8 10/25/2012 1127   HCT 38.0 09/01/2012 0502   PLT 123* 10/25/2012 1127   PLT 164 09/01/2012 0319   MCV 96.6 10/25/2012 1127   MCV 91.9 09/01/2012 0319   MCH 31.6 10/25/2012 1127   MCH 30.8 09/01/2012 0319   MCHC 32.7 10/25/2012 1127   MCHC 33.5 09/01/2012 0319   RDW 15.9* 10/25/2012 1127   RDW 13.6 09/01/2012 0319   LYMPHSABS 0.9 10/25/2012 1127   LYMPHSABS 0.9 09/01/2012 0319   MONOABS 0.0* 10/25/2012 1127   MONOABS 0.7 09/01/2012 0319   EOSABS 0.1 10/25/2012 1127   EOSABS  0.1 09/01/2012 0319   BASOSABS 0.0 10/25/2012 1127   BASOSABS 0.1 09/01/2012 0319        Chemistry      Component Value Date/Time   NA 142 10/25/2012 1127   NA 139 09/01/2012 0502      Component Value Date/Time   CALCIUM 8.7 10/25/2012 1127   CALCIUM 8.6 09/01/2012 0319       Lab Results  Component Value Date   LABCA2 91* 09/13/2012     STUDIES: MRI of the breast done yesterday was reviewed with the patient.  Echocardiogram on 08/19/2012 showed an ejection fraction of 60-65%.   ASSESSMENT: 68 y.o. [place] woman   (1)  s/p Right breast and axillary node biopsy 03/08/2012 for a clinical T4, N1-2', stage III invasive ductal carcinoma, grade 3, triple negative, with an MIB-1 of 93%.  (2)  being treated in the neoadjuvant setting, the original goal being to complete 6 cycles of docetaxel/doxorubicin/cyclophosphamide given every 3 weeks, with patient declining Neulasta injections after cycle 1.  Docetaxel is being dropped beginning cycle 3 due to poor tolerance.  (3) hypertension, poorly controlled  PLAN: Mayelin was apologetic today because she was "such a difficult patient in the beginning". The problem she says was she did not understand what chemotherapy was for. I reassured her that no one wants chemotherapy and no one wants to have cancer and that these things  are really difficult to accept, but she has done a terrific job putting herself through 3 cycles of 6 planned.  She does agree to 3 more cycles, and we will continue to omit the docetaxel. She is having a very good clinical response, and this will not only make her surgery easier, but it hopefully will sterilize any occult metastatic deposits. That is our hope. Her next dose will be April 22, and the fifth one may the 13th. If there are no delays the final dose will be June 3, so she should be ready for definitive surgery the second-half of June.     Bjorn Hallas C    10/25/2012

## 2012-10-25 NOTE — Telephone Encounter (Signed)
Per staff message and POF I have scheduled appts.  JMW  

## 2012-10-25 NOTE — Telephone Encounter (Signed)
appts made and printed.the patient is aware that i will call her w/ her tx appts d/t.

## 2012-10-26 ENCOUNTER — Telehealth: Payer: Self-pay | Admitting: *Deleted

## 2012-10-26 NOTE — Telephone Encounter (Signed)
Lm gv appts d/t for her tx. i had already printed out a schedule for her on 10/25/12 we was just waiting on the tx to be added.

## 2012-11-07 ENCOUNTER — Other Ambulatory Visit: Payer: Self-pay | Admitting: *Deleted

## 2012-11-07 DIAGNOSIS — C50911 Malignant neoplasm of unspecified site of right female breast: Secondary | ICD-10-CM

## 2012-11-07 MED ORDER — METOPROLOL SUCCINATE ER 50 MG PO TB24: 50.0000 mg | ORAL_TABLET | Freq: Every day | ORAL | Status: DC

## 2012-11-09 ENCOUNTER — Telehealth: Payer: Self-pay | Admitting: *Deleted

## 2012-11-09 NOTE — Telephone Encounter (Signed)
Lm made pt aware that her appt time had changed, and she needed to be here@ 8:15 am for a lab with ov and tx to follow. i also made her aware that i will place a cal in the mail as well...td

## 2012-11-15 ENCOUNTER — Ambulatory Visit: Payer: Medicare Other | Admitting: Physician Assistant

## 2012-11-15 ENCOUNTER — Ambulatory Visit (HOSPITAL_BASED_OUTPATIENT_CLINIC_OR_DEPARTMENT_OTHER): Payer: Medicare Other

## 2012-11-15 ENCOUNTER — Ambulatory Visit (HOSPITAL_BASED_OUTPATIENT_CLINIC_OR_DEPARTMENT_OTHER): Payer: Medicare Other | Admitting: Gynecologic Oncology

## 2012-11-15 ENCOUNTER — Encounter: Payer: Self-pay | Admitting: Gynecologic Oncology

## 2012-11-15 ENCOUNTER — Other Ambulatory Visit: Payer: Medicare Other | Admitting: Lab

## 2012-11-15 ENCOUNTER — Other Ambulatory Visit (HOSPITAL_BASED_OUTPATIENT_CLINIC_OR_DEPARTMENT_OTHER): Payer: Medicare Other | Admitting: Lab

## 2012-11-15 ENCOUNTER — Telehealth: Payer: Self-pay | Admitting: *Deleted

## 2012-11-15 VITALS — BP 182/71 | HR 61 | Temp 98.3°F | Resp 20 | Ht 63.0 in | Wt 133.3 lb

## 2012-11-15 DIAGNOSIS — Z171 Estrogen receptor negative status [ER-]: Secondary | ICD-10-CM | POA: Diagnosis not present

## 2012-11-15 DIAGNOSIS — C50319 Malignant neoplasm of lower-inner quadrant of unspecified female breast: Secondary | ICD-10-CM

## 2012-11-15 DIAGNOSIS — C50919 Malignant neoplasm of unspecified site of unspecified female breast: Secondary | ICD-10-CM

## 2012-11-15 DIAGNOSIS — Z5111 Encounter for antineoplastic chemotherapy: Secondary | ICD-10-CM | POA: Diagnosis not present

## 2012-11-15 DIAGNOSIS — C50911 Malignant neoplasm of unspecified site of right female breast: Secondary | ICD-10-CM

## 2012-11-15 DIAGNOSIS — C801 Malignant (primary) neoplasm, unspecified: Secondary | ICD-10-CM

## 2012-11-15 LAB — COMPREHENSIVE METABOLIC PANEL (CC13)
ALT: 14 U/L (ref 0–55)
AST: 14 U/L (ref 5–34)
Albumin: 3.4 g/dL — ABNORMAL LOW (ref 3.5–5.0)
Alkaline Phosphatase: 67 U/L (ref 40–150)
BUN: 9 mg/dL (ref 7.0–26.0)
CO2: 27 mEq/L (ref 22–29)
Calcium: 8.8 mg/dL (ref 8.4–10.4)
Chloride: 108 mEq/L — ABNORMAL HIGH (ref 98–107)
Creatinine: 0.7 mg/dL (ref 0.6–1.1)
Glucose: 108 mg/dl — ABNORMAL HIGH (ref 70–99)
Potassium: 2.9 mEq/L — CL (ref 3.5–5.1)
Sodium: 143 mEq/L (ref 136–145)
Total Bilirubin: 0.27 mg/dL (ref 0.20–1.20)
Total Protein: 6.6 g/dL (ref 6.4–8.3)

## 2012-11-15 LAB — CBC WITH DIFFERENTIAL/PLATELET
Basophils Absolute: 0 10*3/uL (ref 0.0–0.1)
Eosinophils Absolute: 0 10*3/uL (ref 0.0–0.5)
HGB: 12.2 g/dL (ref 11.6–15.9)
MCV: 97.7 fL (ref 79.5–101.0)
MONO#: 0.8 10*3/uL (ref 0.1–0.9)
MONO%: 12.8 % (ref 0.0–14.0)
NEUT#: 4.1 10*3/uL (ref 1.5–6.5)
RDW: 16.4 % — ABNORMAL HIGH (ref 11.2–14.5)
WBC: 6.5 10*3/uL (ref 3.9–10.3)

## 2012-11-15 MED ORDER — SODIUM CHLORIDE 0.9 % IV SOLN
150.0000 mg | Freq: Once | INTRAVENOUS | Status: AC
  Administered 2012-11-15: 150 mg via INTRAVENOUS
  Filled 2012-11-15: qty 5

## 2012-11-15 MED ORDER — SODIUM CHLORIDE 0.9 % IV SOLN
Freq: Once | INTRAVENOUS | Status: AC
  Administered 2012-11-15: 10:00:00 via INTRAVENOUS

## 2012-11-15 MED ORDER — DOXORUBICIN HCL CHEMO IV INJECTION 2 MG/ML
50.0000 mg/m2 | Freq: Once | INTRAVENOUS | Status: AC
  Administered 2012-11-15: 84 mg via INTRAVENOUS
  Filled 2012-11-15: qty 42

## 2012-11-15 MED ORDER — SODIUM CHLORIDE 0.9 % IJ SOLN
10.0000 mL | INTRAMUSCULAR | Status: DC | PRN
  Administered 2012-11-15: 10 mL
  Filled 2012-11-15: qty 10

## 2012-11-15 MED ORDER — LORAZEPAM 2 MG/ML IJ SOLN
0.5000 mg | Freq: Once | INTRAMUSCULAR | Status: AC
  Administered 2012-11-15: 0.5 mg via INTRAVENOUS

## 2012-11-15 MED ORDER — PALONOSETRON HCL INJECTION 0.25 MG/5ML
0.2500 mg | Freq: Once | INTRAVENOUS | Status: AC
  Administered 2012-11-15: 0.25 mg via INTRAVENOUS

## 2012-11-15 MED ORDER — SODIUM CHLORIDE 0.9 % IV SOLN
500.0000 mg/m2 | Freq: Once | INTRAVENOUS | Status: AC
  Administered 2012-11-15: 840 mg via INTRAVENOUS
  Filled 2012-11-15: qty 42

## 2012-11-15 MED ORDER — DEXAMETHASONE SODIUM PHOSPHATE 4 MG/ML IJ SOLN
12.0000 mg | Freq: Once | INTRAMUSCULAR | Status: AC
  Administered 2012-11-15: 12 mg via INTRAVENOUS

## 2012-11-15 MED ORDER — HEPARIN SOD (PORK) LOCK FLUSH 100 UNIT/ML IV SOLN
500.0000 [IU] | Freq: Once | INTRAVENOUS | Status: AC | PRN
  Administered 2012-11-15: 500 [IU]
  Filled 2012-11-15: qty 5

## 2012-11-15 NOTE — Telephone Encounter (Signed)
Noted low potassium level per labs drawn today. Pt received chemo earlier today and has already left the office.  This RN contacted pt at home. Per inquiry by this RN regarding current dose pt stated " I really don't take it every day " . This RN inquired if current type of pill not easy for pt to take with Tiyana stating " yes", this RN then offered to call in other form for easier use Huntley stated she would prefer to take what she has in the home and agreed to taking two tablets today and 4 tomorrow. Per MD she should then resume at 1 tab twice a day.  Per conversation this RN reiterated concerns relating to low potassium with pt verabalizing understanding.

## 2012-11-15 NOTE — Progress Notes (Signed)
ID: Janice Powell   DOB: 1945/10/05  MR#: [id number]  CSN#:[id number]  PCP: Londell Moh, MD GYN:  SUClaud Kelp MD OTHER MD:  HISTORY OF PRESENT ILLNESS: Janice Powell noted a mass in her right breast sometime in 2011. 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 More recently her husband twisted her arm to get the mass looked at, and she brought it to Dr. Carolee Rota attention. Diagnostic mammography and right ultrasonography at [facility] 03/02/2012 showed a 6.78 cm irregular mass in the inner aspect of the right breast. There was nipple retraction and erythema around the nipple. The largest lymph node was noted in the right axilla. By ultrasound the large irregular hypoechoic mass was noted in the breast with multiple enlarged axillary lymph nodes. Physical exam confirmed a hard breast with erythema around the right nipple extending inferiorly. The nipple is slightly inverted.  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 Her subsequent history is as detailed below  INTERVAL HISTORY: Janice Powell returns today for continued follow up of her locally advanced right breast cancer.  She is due for day 1 cycle 4 of her neoadjuvant chemotherapy. She tolerated her third cycle well, with the docetaxel being omitted.  No concerns voiced since her last visit.  Reports that she has "been doing well."  REVIEW OF SYSTEMS: Janice Powell has had no fever or chills.  She denies nausea, vomiting, diarrhea, constipation, vaginal, or rectal bleeding at this time.  She reports intermittent right hip pain that is present when she wakes up and improves throughout the day.  She reports her appetite as good.  She denies mouth ulcers, oral sensitivity, or difficulty swallowing.  She denies dizziness, headaches, visual changes, peripheral neuropathy with any extremity, swelling, chest pain, dyspnea, myalgias,  arthralgias, or bone pain.  She sleeps poorly the night of chemotherapy. Otherwise a detailed review of systems today was remarkably benign.  PAST MEDICAL HISTORY: Past Medical History  Diagnosis Date  . Hypertension   . Cancer     right breast  . Pneumonia     hx  . GERD (gastroesophageal reflux disease)     occ  GERD osteopenia  PAST SURGICAL HISTORY: Past Surgical History  Procedure Laterality Date  . Appendectomy  1992  . Tubal ligation  1979  . Cholecystectomy  2000  . Portacath placement  08/15/2012    Procedure: INSERTION PORT-A-CATH;  Surgeon: Ernestene Mention, MD;  Location: [facility] OR;  Service: General;  Laterality: Left;    FAMILY HISTORY Family History  Problem Relation Age of Onset  . Heart disease Mother    the patient's father died in his sleep at age 69. The patient's mother died at the age of 52 from a myocardial infarction. The patient has one brother and 2 sisters, all surviving. There is no history of breast or ovarian cancer in the family.  GYNECOLOGIC HISTORY: Menarche age 5, first live birth age 62, she is GX P5, menopause age 70. She did not take hormone replacement.  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 Her husband Janice Powell worked in the post office 41 years, and is also now retired. He is a former Arts development officer. Son Janice Powell lives in [place] and manages an [employer] store. Daughter Janice Powell also lives in [place] and manages the IT Department at [employer]. Son Janice Powell  Pore Powell is a Therapist, sports. Daughter Janice Powell is a Doctor, hospital for [employer]. Son Janice Powell is an Teaching laboratory technician. The patient has 8 grandchildren. She attends the [employer]   ADVANCED DIRECTIVES: Not in place  HEALTH MAINTENANCE: History  Substance Use Topics  . Smoking status: Never Smoker   . Smokeless tobacco: Not on file  . Alcohol Use: No     Colonoscopy:  PAP:  Bone density:  January  2011  Lipid panel:  No Known Allergies  Current Outpatient Prescriptions  Medication Sig Dispense Refill  . dexamethasone (DECADRON) 4 MG tablet Take 2 tablets by mouth twice a day as directed  30 tablet  3  . lidocaine-prilocaine (EMLA) cream Apply 1 application topically as needed.       Marland Kitchen lisinopril (PRINIVIL) 10 MG tablet Take 1 tablet (10 mg total) by mouth daily.  30 tablet  12  . loperamide (IMODIUM) 2 MG capsule Take 1 capsule (2 mg total) by mouth 4 (four) times daily as needed for diarrhea or loose stools.  12 capsule  0  . LORazepam (ATIVAN) 0.5 MG tablet Take 1 tablet (0.5 mg total) by mouth at bedtime as needed for anxiety.  30 tablet  0  . metoprolol succinate (TOPROL XL) 50 MG 24 hr tablet Take 1 tablet (50 mg total) by mouth daily. Take with or immediately following a meal.  90 tablet  0  . ondansetron (ZOFRAN) 8 MG tablet Take 1 tablet (8 mg total) by mouth every 12 (twelve) hours as needed for nausea.  30 tablet  1  . potassium chloride SA (K-DUR,KLOR-CON) 20 MEQ tablet Take 1 tablet (20 mEq total) by mouth 2 (two) times daily.  60 tablet  1  . prochlorperazine (COMPAZINE) 10 MG tablet Take 10 mg by mouth every 6 (six) hours as needed. Nausea      . HYDROcodone-acetaminophen (NORCO/VICODIN) 5-325 MG per tablet Take 1-2 tablets by mouth every 4 (four) hours as needed for pain.  50 tablet  1   No current facility-administered medications for this visit.    OBJECTIVE: Middle-aged [place] American woman in no acute distress Filed Vitals:   11/15/12 0839  BP: 182/71  Pulse: 61  Temp: 98.3 F (36.8 C)  Resp: 20     Body mass index is 23.62 kg/(m^2).    ECOG FS: 1 Filed Weights   11/15/12 0839  Weight: 133 lb 4.8 oz (60.464 kg)   Sclerae anicteric Oropharynx clear No cervical or supraclavicular adenopathy palpated Lungs clear to auscultation with no rales or rhonchi noted Heart regular rate and rhythm Abdomen  soft, nontender, positive bowel sounds MSK no focal  spinal tenderness No peripheral edema Neuro: nonfocal, well oriented, pleasant affect Breasts:  deferred  LAB RESULTS: CBC    Component Value Date/Time   WBC 6.5 11/15/2012 0815   WBC 5.1 09/01/2012 0319   RBC 3.92 11/15/2012 0815   RBC 4.19 09/01/2012 0319   HGB 12.2 11/15/2012 0815   HGB 12.9 09/01/2012 0502   HCT 38.3 11/15/2012 0815   HCT 38.0 09/01/2012 0502   PLT 293 11/15/2012 0815   PLT 164 09/01/2012 0319   MCV 97.7 11/15/2012 0815   MCV 91.9 09/01/2012 0319   MCH 31.1 11/15/2012 0815   MCH 30.8 09/01/2012 0319   MCHC 31.9 11/15/2012 0815   MCHC 33.5 09/01/2012 0319   RDW 16.4* 11/15/2012 0815   RDW 13.6 09/01/2012 0319   LYMPHSABS 1.5 11/15/2012 0815   LYMPHSABS 0.9  09/01/2012 0319   MONOABS 0.8 11/15/2012 0815   MONOABS 0.7 09/01/2012 0319   EOSABS 0.0 11/15/2012 0815   EOSABS 0.1 09/01/2012 0319   BASOSABS 0.0 11/15/2012 0815   BASOSABS 0.1 09/01/2012 0319        Chemistry      Component Value Date/Time   NA 142 10/25/2012 1127   NA 139 09/01/2012 0502      Component Value Date/Time   CALCIUM 8.7 10/25/2012 1127   CALCIUM 8.6 09/01/2012 0319       Lab Results  Component Value Date   LABCA2 91* 09/13/2012     STUDIES: MRI of the breast done yesterday was reviewed with the patient.  Echocardiogram on 08/19/2012 showed an ejection fraction of 60-65%.   ASSESSMENT: 67 y.o. [place] woman   (1)  s/p Right breast and axillary node biopsy 03/08/2012 for a clinical T4, N1-2', stage Powell invasive ductal carcinoma, grade 3, triple negative, with an MIB-1 of 93%.  (2)  being treated in the neoadjuvant setting, the original goal being to complete 6 cycles of docetaxel/doxorubicin/cyclophosphamide given every 3 weeks, with patient declining Neulasta injections after cycle 1.  Docetaxel is being dropped beginning cycle 3 due to poor tolerance.  (3) hypertension, poorly controlled  PLAN: Lauriann will proceed with neoadjuvant treatment today with planned day 1 cycle 4 of doxorubicin and  cyclophosphamide.  Her next dose will be Dec 06, 2012. If there are no delays, the final dose will be on June 3, then she should be ready for definitive surgery the second-half of June per Dr. Darnelle Catalan.    Possible chemotoxicities associated with this regimen were reviewed. She is advised to call for any questions or concerns.   CROSS, MELISSA DEAL    11/15/2012

## 2012-11-15 NOTE — Patient Instructions (Signed)
Doing well.  Please call for any questions or concerns.

## 2012-11-15 NOTE — Patient Instructions (Signed)
[facility] Discharge Instructions for Patients Receiving Chemotherapy  Today you received the following chemotherapy agents Adriamycin and Cytoxan.  To help prevent nausea and vomiting after your treatment, we encourage you to take your nausea medication.   If you develop nausea and vomiting that is not controlled by your nausea medication, call the clinic. If it is after clinic hours your family physician or the after hours number for the clinic or go to the Emergency Department.   BELOW ARE SYMPTOMS THAT SHOULD BE REPORTED IMMEDIATELY:  *FEVER GREATER THAN 100.5 F  *CHILLS WITH OR WITHOUT FEVER  NAUSEA AND VOMITING THAT IS NOT CONTROLLED WITH YOUR NAUSEA MEDICATION  *UNUSUAL SHORTNESS OF BREATH  *UNUSUAL BRUISING OR BLEEDING  TENDERNESS IN MOUTH AND THROAT WITH OR WITHOUT PRESENCE OF ULCERS  *URINARY PROBLEMS  *BOWEL PROBLEMS  UNUSUAL RASH Items with * indicate a potential emergency and should be followed up as soon as possible.  One of the nurses will contact you 24 hours after your treatment. Please let the nurse know about any problems that you may have experienced. Feel free to call the clinic you have any questions or concerns. The clinic phone number is [phone].   I have been informed and understand all the instructions given to me. I know to contact the clinic, my physician, or go to the Emergency Department if any problems should occur. I do not have any questions at this time, but understand that I may call the clinic during office hours   should I have any questions or need assistance in obtaining follow up care.    __________________________________________  _____________  __________ Signature of Patient or Authorized Representative            Date                   Time    __________________________________________ Nurse's Signature

## 2012-11-23 ENCOUNTER — Ambulatory Visit (HOSPITAL_BASED_OUTPATIENT_CLINIC_OR_DEPARTMENT_OTHER): Payer: Medicare Other | Admitting: Family

## 2012-11-23 ENCOUNTER — Telehealth: Payer: Self-pay | Admitting: *Deleted

## 2012-11-23 ENCOUNTER — Other Ambulatory Visit (HOSPITAL_BASED_OUTPATIENT_CLINIC_OR_DEPARTMENT_OTHER): Payer: Medicare Other | Admitting: Lab

## 2012-11-23 ENCOUNTER — Encounter: Payer: Self-pay | Admitting: Family

## 2012-11-23 VITALS — BP 173/73 | HR 55 | Temp 98.4°F | Resp 20 | Ht 63.0 in | Wt 134.5 lb

## 2012-11-23 DIAGNOSIS — C50919 Malignant neoplasm of unspecified site of unspecified female breast: Secondary | ICD-10-CM

## 2012-11-23 DIAGNOSIS — C50911 Malignant neoplasm of unspecified site of right female breast: Secondary | ICD-10-CM

## 2012-11-23 LAB — CBC WITH DIFFERENTIAL/PLATELET
Basophils Absolute: 0 10*3/uL (ref 0.0–0.1)
EOS%: 1.3 % (ref 0.0–7.0)
Eosinophils Absolute: 0 10*3/uL (ref 0.0–0.5)
HGB: 11.1 g/dL — ABNORMAL LOW (ref 11.6–15.9)
LYMPH%: 30.6 % (ref 14.0–49.7)
MCH: 32.5 pg (ref 25.1–34.0)
MCV: 97.7 fL (ref 79.5–101.0)
MONO%: 0.7 % (ref 0.0–14.0)
Platelets: 147 10*3/uL (ref 145–400)
RBC: 3.43 10*6/uL — ABNORMAL LOW (ref 3.70–5.45)
RDW: 15.8 % — ABNORMAL HIGH (ref 11.2–14.5)

## 2012-11-23 NOTE — Progress Notes (Signed)
ID: Janice Powell   DOB: 11-01-1945  MR#: [id number]  CSN#:[id number]  PCP: Londell Moh, MD GYN:  SUClaud Kelp MD OTHER MD:  HISTORY OF PRESENT ILLNESS: Janice Powell noted a mass in her right breast sometime in 2011. 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 More recently her husband twisted her arm to get the mass looked at, and she brought it to Dr. Carolee Rota attention. Diagnostic mammography and right ultrasonography at [facility] 03/02/2012 showed a 6.78 cm irregular mass in the inner aspect of the right breast. There was nipple retraction and erythema around the nipple. The largest lymph node was noted in the right axilla. By ultrasound the large irregular hypoechoic mass was noted in the breast with multiple enlarged axillary lymph nodes. Physical exam confirmed a hard breast with erythema around the right nipple extending inferiorly. The nipple is slightly inverted.  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 Her subsequent history is as detailed below  INTERVAL HISTORY: Janice Powell returns today for continued follow up of her locally advanced right breast cancer.  She returns today for an interim office visit following neoadjuvant chemotherapy cycle 4 and before preceding with neoadjuvant chemotherapy cycle 5 on 12/06/2012. She has tolerated her chemotherapy cycles well, after Docetaxel was omitted in cycle 3.  No major concerns voiced by her since her last visit.  She reports that she has "been doing well."  She stated she would be meeting with her surgeon soon.  We also discussed what exactly a "triple negative" breast cancer tumor is.  REVIEW OF SYSTEMS: Janice Powell has had no fever or chills.  She denies nausea, vomiting, diarrhea, constipation, vaginal, or rectal bleeding at this time.  She reports her appetite has decreases.  She denies mouth ulcers, oral  sensitivity, or difficulty swallowing.  She denies dizziness, headaches, visual changes, peripheral neuropathy with any extremity, swelling, chest pain or dyspnea.  She reports myalgias/arthralgias/bone pain after Neulasta injections and states she does not want any further Neulasta injections.  We had a discussion about the risks associated with not receiving the Neulasta injections including infection. Otherwise a detailed review of systems today was unremarkable.  PAST MEDICAL HISTORY: Past Medical History  Diagnosis Date  . Hypertension   . Cancer     right breast  . Pneumonia     hx  . GERD (gastroesophageal reflux disease)     occ  . Osteopenia     PAST SURGICAL HISTORY: Past Surgical History  Procedure Laterality Date  . Appendectomy  1992  . Tubal ligation  1979  . Cholecystectomy  2000  . Portacath placement  08/15/2012    Procedure: INSERTION PORT-A-CATH;  Surgeon: Ernestene Mention, MD;  Location: [facility] OR;  Service: General;  Laterality: Left;    FAMILY HISTORY Family History  Problem Relation Age of Onset  . Heart disease Mother   The patient's father died in his sleep at age 70. The patient's mother died at the age of 37 from a myocardial infarction. The patient has one brother and 2 sisters, all surviving. There is no history of breast or ovarian cancer in the family.  GYNECOLOGIC HISTORY: Menarche age 54, first live birth age 37, she is GX P5, menopause age 86. She did not take hormone replacement.  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 Her husband [name] worked in the post office 41 years, and is also now retired. He is a former Arts development officer. Son [name] lives in [place] and manages an [employer] store. Daughter Grey Schlauch also lives in [place] and manages the IT Department at [employer]. Son Maicey Barrientez III is a Therapist, sports. Daughter [name] is a Doctor, hospital for [employer]. Son [name] is an Copywriter, advertising. The patient has 8 grandchildren. She attends the [employer]   ADVANCED DIRECTIVES: Not in place  HEALTH MAINTENANCE: History  Substance Use Topics  . Smoking status: Never Smoker   . Smokeless tobacco: Never Used  . Alcohol Use: No     Colonoscopy:  Not on file  PAP:  Not on file  Bone density:  January 2011  Lipid panel:  Not on file  No Known Allergies  Current Outpatient Prescriptions  Medication Sig Dispense Refill  . dexamethasone (DECADRON) 4 MG tablet Take 2 tablets by mouth twice a day as directed  30 tablet  3  . HYDROcodone-acetaminophen (NORCO/VICODIN) 5-325 MG per tablet Take 1-2 tablets by mouth every 4 (four) hours as needed for pain.  50 tablet  1  . lidocaine-prilocaine (EMLA) cream Apply 1 application topically as needed.       Marland Kitchen lisinopril (PRINIVIL) 10 MG tablet Take 1 tablet (10 mg total) by mouth daily.  30 tablet  12  . loperamide (IMODIUM) 2 MG capsule Take 1 capsule (2 mg total) by mouth 4 (four) times daily as needed for diarrhea or loose stools.  12 capsule  0  . LORazepam (ATIVAN) 0.5 MG tablet Take 1 tablet (0.5 mg total) by mouth at bedtime as needed for anxiety.  30 tablet  0  . metoprolol succinate (TOPROL XL) 50 MG 24 hr tablet Take 1 tablet (50 mg total) by mouth daily. Take with or immediately following a meal.  90 tablet  0  . ondansetron (ZOFRAN) 8 MG tablet Take 1 tablet (8 mg total) by mouth every 12 (twelve) hours as needed for nausea.  30 tablet  1  . potassium chloride SA (K-DUR,KLOR-CON) 20 MEQ tablet Take 1 tablet (20 mEq total) by mouth 2 (two) times daily.  60 tablet  1  . prochlorperazine (COMPAZINE) 10 MG tablet Take 10 mg by mouth every 6 (six) hours as needed. Nausea       No current facility-administered medications for this visit.    OBJECTIVE: Middle-aged [place] American woman in no acute distress Filed Vitals:   11/23/12 1138  BP: 173/73  Pulse: 55  Temp: 98.4 F (36.9 C)  Resp: 20     Body mass  index is 23.83 kg/(m^2).    ECOG FS: 1 Filed Weights   11/23/12 1138  Weight: 134 lb 8 oz (61.009 kg)   General appearance: Alert, cooperative, well nourished, thin frame, no apparent distress Head: Normocephalic, without obvious abnormality, atraumatic Eyes:Arcus senilis, PERRLA, EOMI Nose: Nares, septum and mucosa are normal, no drainage or sinus tenderness. Neck: No adenopathy, supple, symmetrical, trachea midline, thyroid not enlarged, no tenderness Resp: Clear to auscultation bilaterally Cardio: Regular rate and rhythm, S1, S2 normal, no murmur, click, rub or gallop Breasts:  Deferred GI: Soft, not distended, non-tender, hypoactive bowel sounds, no organomegaly Extremities: Extremities normal, atraumatic, no cyanosis or edema Lymph nodes: Cervical, supraclavicular, and axillary nodes normal Neurologic: Grossly normal  LAB RESULTS: CBC    Component Value Date/Time   WBC 2.9* 11/23/2012 1105   WBC 5.1 09/01/2012 0319  RBC 3.43* 11/23/2012 1105   RBC 4.19 09/01/2012 0319   HGB 11.1* 11/23/2012 1105   HGB 12.9 09/01/2012 0502   HCT 33.5* 11/23/2012 1105   HCT 38.0 09/01/2012 0502   PLT 147 11/23/2012 1105   PLT 164 09/01/2012 0319   MCV 97.7 11/23/2012 1105   MCV 91.9 09/01/2012 0319   MCH 32.5 11/23/2012 1105   MCH 30.8 09/01/2012 0319   MCHC 33.3 11/23/2012 1105   MCHC 33.5 09/01/2012 0319   RDW 15.8* 11/23/2012 1105   RDW 13.6 09/01/2012 0319   LYMPHSABS 0.9 11/23/2012 1105   LYMPHSABS 0.9 09/01/2012 0319   MONOABS 0.0* 11/23/2012 1105   MONOABS 0.7 09/01/2012 0319   EOSABS 0.0 11/23/2012 1105   EOSABS 0.1 09/01/2012 0319   BASOSABS 0.0 11/23/2012 1105   BASOSABS 0.1 09/01/2012 0319        Chemistry      Component Value Date/Time   NA 143 11/15/2012 0815   NA 139 09/01/2012 0502      Component Value Date/Time   CALCIUM 8.8 11/15/2012 0815   CALCIUM 8.6 09/01/2012 0319       Lab Results  Component Value Date   LABCA2 91* 09/13/2012     STUDIES: Echocardiogram on 08/19/2012 showed an  ejection fraction of 60-65%.   ASSESSMENT: 68 y.o. Janice Powell, [place] woman:   (1)  S/p Right breast and axillary node biopsy 03/08/2012 for a clinical T4, N1-2, stage III invasive ductal carcinoma, grade 3, triple negative, with an MIB-1 of 93%.  (2)  Currently being treated in the neoadjuvant setting.  The original goal was to complete 6 cycles of docetaxel/doxorubicin/cyclophosphamide given every 3 weeks, with patient declining Neulasta injections after cycle 1.  Docetaxel was dropped at the beginning of cycle 3 due to poor tolerance.  (3) Hypertension, poorly controlled  PLAN: We plan to see Janice Powell again on 12/06/2012 when she is scheduled to proceed with neoadjuvant treatment  day 1 of cycle 5 of doxorubicin and cyclophosphamide.  She is scheduled for an interim office visit and laboratories on 12/13/2012.   Her final neoadjuvant chemotherapy treatment, if there are no delays, is scheduled for 12/27/2012.  She would then be ready for definitive surgery in the second-half of June.  All questions were answered.  The patient is encouraged to contact us in the interim with any questions or concerns.  The plan of care for this patient was discussed with Dr. Darnelle Catalan.  Larina Bras NP-C 11/27/2012  7:33 PM

## 2012-11-23 NOTE — Telephone Encounter (Signed)
i also emailed MB to added a tx for 12/27/12...td

## 2012-11-23 NOTE — Patient Instructions (Addendum)
Results for orders placed in visit on 11/23/12 (from the past 24 hour(s))  CBC WITH DIFFERENTIAL     Status: Abnormal   Collection Time    11/23/12 11:05 AM      Result Value Range   WBC 2.9 (*) 3.9 - 10.3 10e3/uL   NEUT# 1.9  1.5 - 6.5 10e3/uL   HGB 11.1 (*) 11.6 - 15.9 g/dL   HCT 16.1 (*) 09.6 - 04.5 %   Platelets 147  145 - 400 10e3/uL   MCV 97.7  79.5 - 101.0 fL   MCH 32.5  25.1 - 34.0 pg   MCHC 33.3  31.5 - 36.0 g/dL   RBC 4.09 (*) 8.11 - 9.14 10e6/uL   RDW 15.8 (*) 11.2 - 14.5 %   lymph# 0.9  0.9 - 3.3 10e3/uL   MONO# 0.0 (*) 0.1 - 0.9 10e3/uL   Eosinophils Absolute 0.0  0.0 - 0.5 10e3/uL   Basophils Absolute 0.0  0.0 - 0.1 10e3/uL   NEUT% 66.6  38.4 - 76.8 %   LYMPH% 30.6  14.0 - 49.7 %   MONO% 0.7  0.0 - 14.0 %   EOS% 1.3  0.0 - 7.0 %   BASO% 0.8  0.0 - 2.0 %   Narrative:    Performed At:  [facility]               [address]               [place], [place] [address]   Please contact us at [phone] if you have any questions or concerns.

## 2012-11-23 NOTE — Telephone Encounter (Signed)
Lm made pt aware that [facility] added more appt to her schedule. i informed her that i would place a cal in the mail...td

## 2012-11-27 ENCOUNTER — Encounter: Payer: Self-pay | Admitting: Family

## 2012-12-06 ENCOUNTER — Telehealth: Payer: Self-pay | Admitting: *Deleted

## 2012-12-06 ENCOUNTER — Ambulatory Visit (HOSPITAL_BASED_OUTPATIENT_CLINIC_OR_DEPARTMENT_OTHER): Payer: Medicare Other

## 2012-12-06 ENCOUNTER — Other Ambulatory Visit: Payer: Medicare Other | Admitting: Lab

## 2012-12-06 ENCOUNTER — Other Ambulatory Visit (HOSPITAL_BASED_OUTPATIENT_CLINIC_OR_DEPARTMENT_OTHER): Payer: Medicare Other | Admitting: Lab

## 2012-12-06 ENCOUNTER — Encounter: Payer: Self-pay | Admitting: Physician Assistant

## 2012-12-06 ENCOUNTER — Ambulatory Visit (HOSPITAL_BASED_OUTPATIENT_CLINIC_OR_DEPARTMENT_OTHER): Payer: Medicare Other | Admitting: Physician Assistant

## 2012-12-06 VITALS — BP 173/78 | HR 62 | Temp 98.3°F | Resp 20 | Ht 63.0 in | Wt 134.0 lb

## 2012-12-06 DIAGNOSIS — C50911 Malignant neoplasm of unspecified site of right female breast: Secondary | ICD-10-CM

## 2012-12-06 DIAGNOSIS — Z5111 Encounter for antineoplastic chemotherapy: Secondary | ICD-10-CM | POA: Diagnosis not present

## 2012-12-06 DIAGNOSIS — C50919 Malignant neoplasm of unspecified site of unspecified female breast: Secondary | ICD-10-CM

## 2012-12-06 DIAGNOSIS — C773 Secondary and unspecified malignant neoplasm of axilla and upper limb lymph nodes: Secondary | ICD-10-CM

## 2012-12-06 DIAGNOSIS — E876 Hypokalemia: Secondary | ICD-10-CM

## 2012-12-06 DIAGNOSIS — Z171 Estrogen receptor negative status [ER-]: Secondary | ICD-10-CM

## 2012-12-06 DIAGNOSIS — C50319 Malignant neoplasm of lower-inner quadrant of unspecified female breast: Secondary | ICD-10-CM | POA: Diagnosis not present

## 2012-12-06 DIAGNOSIS — C801 Malignant (primary) neoplasm, unspecified: Secondary | ICD-10-CM

## 2012-12-06 DIAGNOSIS — I10 Essential (primary) hypertension: Secondary | ICD-10-CM

## 2012-12-06 LAB — CBC WITH DIFFERENTIAL/PLATELET
BASO%: 0.9 % (ref 0.0–2.0)
EOS%: 0.9 % (ref 0.0–7.0)
Eosinophils Absolute: 0 10*3/uL (ref 0.0–0.5)
LYMPH%: 40.8 % (ref 14.0–49.7)
MCHC: 32.8 g/dL (ref 31.5–36.0)
MCV: 97 fL (ref 79.5–101.0)
MONO%: 26 % — ABNORMAL HIGH (ref 0.0–14.0)
NEUT#: 1 10*3/uL — ABNORMAL LOW (ref 1.5–6.5)
Platelets: 279 10*3/uL (ref 145–400)
RBC: 3.65 10*6/uL — ABNORMAL LOW (ref 3.70–5.45)
RDW: 15.1 % — ABNORMAL HIGH (ref 11.2–14.5)
nRBC: 1 % — ABNORMAL HIGH (ref 0–0)

## 2012-12-06 LAB — COMPREHENSIVE METABOLIC PANEL (CC13)
ALT: 16 U/L (ref 0–55)
AST: 15 U/L (ref 5–34)
Alkaline Phosphatase: 65 U/L (ref 40–150)
Creatinine: 0.7 mg/dL (ref 0.6–1.1)
Sodium: 143 mEq/L (ref 136–145)
Total Bilirubin: 0.33 mg/dL (ref 0.20–1.20)
Total Protein: 6.9 g/dL (ref 6.4–8.3)

## 2012-12-06 MED ORDER — SODIUM CHLORIDE 0.9 % IV SOLN
150.0000 mg | Freq: Once | INTRAVENOUS | Status: AC
  Administered 2012-12-06: 150 mg via INTRAVENOUS
  Filled 2012-12-06: qty 5

## 2012-12-06 MED ORDER — PALONOSETRON HCL INJECTION 0.25 MG/5ML
0.2500 mg | Freq: Once | INTRAVENOUS | Status: AC
  Administered 2012-12-06: 0.25 mg via INTRAVENOUS

## 2012-12-06 MED ORDER — DEXAMETHASONE SODIUM PHOSPHATE 20 MG/5ML IJ SOLN
12.0000 mg | Freq: Once | INTRAMUSCULAR | Status: AC
  Administered 2012-12-06: 12 mg via INTRAVENOUS

## 2012-12-06 MED ORDER — SODIUM CHLORIDE 0.9 % IV SOLN
Freq: Once | INTRAVENOUS | Status: AC
  Administered 2012-12-06: 12:00:00 via INTRAVENOUS

## 2012-12-06 MED ORDER — POTASSIUM CHLORIDE CRYS ER 20 MEQ PO TBCR: EXTENDED_RELEASE_TABLET | ORAL | Status: DC

## 2012-12-06 MED ORDER — HEPARIN SOD (PORK) LOCK FLUSH 100 UNIT/ML IV SOLN
500.0000 [IU] | Freq: Once | INTRAVENOUS | Status: AC | PRN
  Administered 2012-12-06: 500 [IU]
  Filled 2012-12-06: qty 5

## 2012-12-06 MED ORDER — DOXORUBICIN HCL CHEMO IV INJECTION 2 MG/ML
50.0000 mg/m2 | Freq: Once | INTRAVENOUS | Status: AC
  Administered 2012-12-06: 14:00:00 via INTRAVENOUS
  Filled 2012-12-06: qty 1

## 2012-12-06 MED ORDER — SODIUM CHLORIDE 0.9 % IJ SOLN
10.0000 mL | INTRAMUSCULAR | Status: DC | PRN
  Administered 2012-12-06: 10 mL
  Filled 2012-12-06: qty 10

## 2012-12-06 MED ORDER — SODIUM CHLORIDE 0.9 % IV SOLN
500.0000 mg/m2 | Freq: Once | INTRAVENOUS | Status: AC
  Administered 2012-12-06: 840 mg via INTRAVENOUS
  Filled 2012-12-06: qty 42

## 2012-12-06 MED ORDER — LORAZEPAM 2 MG/ML IJ SOLN
0.5000 mg | Freq: Once | INTRAMUSCULAR | Status: AC
  Administered 2012-12-06: 0.5 mg via INTRAVENOUS

## 2012-12-06 NOTE — Telephone Encounter (Signed)
This RN spoke with pt per need to evaluate current use of prescription potassium.  Pt verifies " I take it twice a day ".  This RN reviewed lab value of continued level of 2.9.  Per discussion pt stated she took the pills as directed " three times a day and then I ran out ".  This RN again reiterated concern for continued low potassium and inquired about issues that could hinder compliance such as difficulty swallowing the pill. Lafeta states " I was low before and my doctor gave me a big yellow pill and it brought my potassium up real fast "  Per further inquiry pt verified drug as Kdur and " yes it kinda dissolved quickly when I put it in my mouth ". " I didn't have any problems swallowing it ".  This RN verified pt's pharmacy as correct and informed pt above medication would be called in now. Requested pt to pick up tonight and take one asap and then one before bed. Reviewed need to take 1 tablet of 20 meq 4 times a day for 5 days and then she can resume twice a day dosing. Denice verbalized understanding.  This RN called pt's pharmacy and inquired name and dose of current prescription for potassium. Informed medication as klor con with dosing instructions of bid- last filled in January.  Pt has script for Kdur on file from April 2014 " but she never requested it ".  This RN requested d/c of current prescriptions and gave new prescription as recommended by AB/PA.

## 2012-12-06 NOTE — Progress Notes (Signed)
ID: TIMIKA MUENCH   DOB: 1945/10/26  MR#: [id number]  CSN#:[id number]  PCP: Londell Moh, MD GYN:  SUClaud Kelp MD OTHER MD:   HISTORY OF PRESENT ILLNESS: Ms. Chojnowski noted a mass in her right breast sometime in 2011. 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 More recently her husband twisted her arm to get the mass looked at, and she brought it to Dr. Carolee Rota attention. Diagnostic mammography and right ultrasonography at [facility] 03/02/2012 showed a 6.78 cm irregular mass in the inner aspect of the right breast. There was nipple retraction and erythema around the nipple. The largest lymph node was noted in the right axilla. By ultrasound the large irregular hypoechoic mass was noted in the breast with multiple enlarged axillary lymph nodes. Physical exam confirmed a hard breast with erythema around the right nipple extending inferiorly. The nipple is slightly inverted.  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 Her subsequent history is as detailed below  INTERVAL HISTORY: Minah returns today for follow up of her locally advanced right breast cancer.  She is due for day 1 cycle 5 of doxorubicin/cyclophosphamide, given every 3 weeks, with docetaxel having been discontinued.   Mashal is feeling well, in fact when asked what her greatest complaint is today she says "there isn't one".  Her energy level is fairly good. She's had no problems at all with nausea or emesis is having good bowel movements. She currently denies any signs of peripheral neuropathy, and has no unusual bony pain. She has declined Neulasta injections in the past due to the subsequent bony pain.  REVIEW OF SYSTEMS: Chrisanna  denies any fevers, chills, or night sweats. She's had no rashes, abnormal bruising, or bleeding. She denies any cough, shortness of breath, chest pain, or palpitations.  She's had no abnormal headaches or dizziness.  A detailed review of systems is otherwise stable and noncontributory today.   PAST MEDICAL HISTORY: Past Medical History  Diagnosis Date  . Hypertension   . Cancer     right breast  . Pneumonia     hx  . GERD (gastroesophageal reflux disease)     occ  . Osteopenia   GERD osteopenia  PAST SURGICAL HISTORY: Past Surgical History  Procedure Laterality Date  . Appendectomy  1992  . Tubal ligation  1979  . Cholecystectomy  2000  . Portacath placement  08/15/2012    Procedure: INSERTION PORT-A-CATH;  Surgeon: Ernestene Mention, MD;  Location: [facility] OR;  Service: General;  Laterality: Left;    FAMILY HISTORY Family History  Problem Relation Age of Onset  . Heart disease Mother    the patient's father died in his sleep at age 52. The patient's mother died at the age of 64 from a myocardial infarction. The patient has one brother and 2 sisters, all surviving. There is no history of breast or ovarian cancer in the family.  GYNECOLOGIC HISTORY: Menarche age 2, first live birth age 1, she is GX P5, menopause age 27. She did not take hormone replacement.  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 She is a Optician, dispensing. Her husband [name] worked in the post office 41 years, and is also now retired. He is a former Arts development officer. Son [name] lives in [place] and manages an [employer] store. Daughter Joylene Wescott also lives in [place] and manages  the IT Department at [employer]. Son Eilish Mcdaniel III is a Therapist, sports. Daughter [name] is a Doctor, hospital for [employer]. Son [name] is an Teaching laboratory technician. The patient has 8 grandchildren. She attends the white [employer]   ADVANCED DIRECTIVES: Not in place  HEALTH MAINTENANCE: History  Substance Use Topics  . Smoking status: Never Smoker   . Smokeless tobacco: Never Used  . Alcohol Use: No     Colonoscopy:  PAP:  Bone density:  January  2011  Lipid panel:  No Known Allergies  Current Outpatient Prescriptions  Medication Sig Dispense Refill  . dexamethasone (DECADRON) 4 MG tablet Take 2 tablets by mouth twice a day as directed  30 tablet  3  . HYDROcodone-acetaminophen (NORCO/VICODIN) 5-325 MG per tablet Take 1-2 tablets by mouth every 4 (four) hours as needed for pain.  50 tablet  1  . lidocaine-prilocaine (EMLA) cream Apply 1 application topically as needed.       Marland Kitchen lisinopril (PRINIVIL) 10 MG tablet Take 1 tablet (10 mg total) by mouth daily.  30 tablet  12  . loperamide (IMODIUM) 2 MG capsule Take 1 capsule (2 mg total) by mouth 4 (four) times daily as needed for diarrhea or loose stools.  12 capsule  0  . LORazepam (ATIVAN) 0.5 MG tablet Take 1 tablet (0.5 mg total) by mouth at bedtime as needed for anxiety.  30 tablet  0  . metoprolol succinate (TOPROL XL) 50 MG 24 hr tablet Take 1 tablet (50 mg total) by mouth daily. Take with or immediately following a meal.  90 tablet  0  . ondansetron (ZOFRAN) 8 MG tablet Take 1 tablet (8 mg total) by mouth every 12 (twelve) hours as needed for nausea.  30 tablet  1  . potassium chloride SA (K-DUR,KLOR-CON) 20 MEQ tablet Take 1 tablet (20 mEq total) by mouth 2 (two) times daily.  60 tablet  1  . prochlorperazine (COMPAZINE) 10 MG tablet Take 10 mg by mouth every 6 (six) hours as needed. Nausea       No current facility-administered medications for this visit.    OBJECTIVE: Middle-aged [place] American woman in no acute distress Filed Vitals:   12/06/12 1039  BP: 173/78  Pulse: 62  Temp: 98.3 F (36.8 C)  Resp: 20     Body mass index is 23.74 kg/(m^2).    ECOG FS: 1 Filed Weights   12/06/12 1039  Weight: 134 lb (60.782 kg)   Sclerae unicteric Oropharynx clear, no visible ulcerations No cervical or supraclavicular adenopathy  Lungs clear to auscultation, no wheezes or rhonchi  Heart regular rate and rhythm Abdomen  soft, nontender to palpation. positive bowel  sounds MSK no focal spinal tenderness No peripheral edema Neuro: nonfocal, well oriented, pleasant affect Breasts:  The right breast is considerably softer. Significant eschar is noted in the central portion of the breast covering the areolar complex. The mass in the central portion of the breast seems softer than before, and is freely movable. There is no obvious drainage or bleeding. The left breast is unremarkable.   LAB RESULTS: CBC    Component Value Date/Time   WBC 3.3* 12/06/2012 1008   WBC 5.1 09/01/2012 0319   RBC 3.65* 12/06/2012 1008   RBC 4.19 09/01/2012 0319   HGB 11.6 12/06/2012 1008   HGB 12.9 09/01/2012 0502   HCT 35.4 12/06/2012 1008   HCT 38.0 09/01/2012 0502   PLT 279 12/06/2012 1008   PLT 164 09/01/2012 0319  MCV 97.0 12/06/2012 1008   MCV 91.9 09/01/2012 0319   MCH 31.8 12/06/2012 1008   MCH 30.8 09/01/2012 0319   MCHC 32.8 12/06/2012 1008   MCHC 33.5 09/01/2012 0319   RDW 15.1* 12/06/2012 1008   RDW 13.6 09/01/2012 0319   LYMPHSABS 1.4 12/06/2012 1008   LYMPHSABS 0.9 09/01/2012 0319   MONOABS 0.9 12/06/2012 1008   MONOABS 0.7 09/01/2012 0319   EOSABS 0.0 12/06/2012 1008   EOSABS 0.1 09/01/2012 0319   BASOSABS 0.0 12/06/2012 1008   BASOSABS 0.1 09/01/2012 0319    CMP     Component Value Date/Time   NA 143 11/15/2012 0815   NA 139 09/01/2012 0502   K 2.9 Repeated and Verified* 11/15/2012 0815   K 3.3* 09/01/2012 0502   CL 108* 11/15/2012 0815   CL 102 09/01/2012 0502   CO2 27 11/15/2012 0815   CO2 25 09/01/2012 0319   GLUCOSE 108* 11/15/2012 0815   GLUCOSE 113* 09/01/2012 0502   BUN 9.0 11/15/2012 0815   BUN 8 09/01/2012 0502   CREATININE 0.7 11/15/2012 0815   CREATININE 0.80 09/01/2012 0502   CALCIUM 8.8 11/15/2012 0815   CALCIUM 8.6 09/01/2012 0319   PROT 6.6 11/15/2012 0815   PROT 6.9 09/01/2012 0319   ALBUMIN 3.4* 11/15/2012 0815   ALBUMIN 3.1* 09/01/2012 0319   AST 14 11/15/2012 0815   AST 21 09/01/2012 0319   ALT 14 11/15/2012 0815   ALT 14 09/01/2012 0319   ALKPHOS 67 11/15/2012 0815   ALKPHOS 47  09/01/2012 0319   BILITOT 0.27 11/15/2012 0815   BILITOT 0.5 09/01/2012 0319   GFRNONAA >90 09/01/2012 0319   GFRAA >90 09/01/2012 0319       STUDIES:  Echocardiogram on 08/19/2012 showed an ejection fraction of 60-65%.   ASSESSMENT: 67 y.o. [place] woman   (1)  s/p Right breast and axillary node biopsy 03/08/2012 for a clinical T4, N1-2', stage III invasive ductal carcinoma, grade 3, triple negative, with an MIB-1 of 93%.  (2)  being treated in the neoadjuvant setting, the original goal being to complete 6 cycles of docetaxel/doxorubicin/cyclophosphamide given every 3 weeks, with patient declining Neulasta injections after cycle 1.  Docetaxel was dropped beginning cycle 3 due to poor tolerance, but continuing with doxorubicin/cyclophosphamide every 3 weeks.  (3) hypertension, poorly controlled  PLAN: Anysia is really tolerating chemotherapy extremely well. I have reviewed her labs with Dr. Darnelle Catalan, and he is in agreement with proceeding today with day 1 cycle 5 as planned. We are, however, going to add Neulasta injection tomorrow for granulocyte support.  Frederika is scheduled to meet with Dr. Derrell Lolling later this week, and I'll see her again next week on May 20th for assessment of chemotoxicity. She voices understanding and agreement with this plan, and will call with any changes or problems prior to her next scheduled appointment.   Camauri Fleece    12/06/2012

## 2012-12-06 NOTE — Telephone Encounter (Signed)
appts made printed.....td 

## 2012-12-06 NOTE — Progress Notes (Signed)
Ok per [employer], [place], to treat despite ANC of 1.0.

## 2012-12-06 NOTE — Patient Instructions (Addendum)
[facility] Discharge Instructions for Patients Receiving Chemotherapy  Today you received the following chemotherapy agents Cytoxan and Adriamycin.  To help prevent nausea and vomiting after your treatment, we encourage you to take your nausea medication as prescribed.    If you develop nausea and vomiting that is not controlled by your nausea medication, call the clinic. If it is after clinic hours your family physician or the after hours number for the clinic or go to the Emergency Department.   BELOW ARE SYMPTOMS THAT SHOULD BE REPORTED IMMEDIATELY:  *FEVER GREATER THAN 100.5 F  *CHILLS WITH OR WITHOUT FEVER  NAUSEA AND VOMITING THAT IS NOT CONTROLLED WITH YOUR NAUSEA MEDICATION  *UNUSUAL SHORTNESS OF BREATH  *UNUSUAL BRUISING OR BLEEDING  TENDERNESS IN MOUTH AND THROAT WITH OR WITHOUT PRESENCE OF ULCERS  *URINARY PROBLEMS  *BOWEL PROBLEMS  UNUSUAL RASH Items with * indicate a potential emergency and should be followed up as soon as possible.  Please let the nurse know about any problems that you may have experienced. Feel free to call the clinic you have any questions or concerns. The clinic phone number is [phone].   I have been informed and understand all the instructions given to me. I know to contact the clinic, my physician, or go to the Emergency Department if any problems should occur. I do not have any questions at this time, but understand that I may call the clinic during office hours   should I have any questions or need assistance in obtaining follow up care.    __________________________________________  _____________  __________ Signature of Patient or Authorized Representative            Date                   Time    __________________________________________ Nurse's Signature    

## 2012-12-07 ENCOUNTER — Ambulatory Visit (HOSPITAL_BASED_OUTPATIENT_CLINIC_OR_DEPARTMENT_OTHER): Payer: Medicare Other

## 2012-12-07 VITALS — BP 199/64 | HR 60 | Temp 98.1°F

## 2012-12-07 DIAGNOSIS — C773 Secondary and unspecified malignant neoplasm of axilla and upper limb lymph nodes: Secondary | ICD-10-CM | POA: Diagnosis not present

## 2012-12-07 DIAGNOSIS — C801 Malignant (primary) neoplasm, unspecified: Secondary | ICD-10-CM

## 2012-12-07 DIAGNOSIS — C50319 Malignant neoplasm of lower-inner quadrant of unspecified female breast: Secondary | ICD-10-CM

## 2012-12-07 DIAGNOSIS — C50911 Malignant neoplasm of unspecified site of right female breast: Secondary | ICD-10-CM

## 2012-12-07 MED ORDER — PEGFILGRASTIM INJECTION 6 MG/0.6ML
6.0000 mg | Freq: Once | SUBCUTANEOUS | Status: AC
  Administered 2012-12-07: 6 mg via SUBCUTANEOUS
  Filled 2012-12-07: qty 0.6

## 2012-12-08 ENCOUNTER — Ambulatory Visit (INDEPENDENT_AMBULATORY_CARE_PROVIDER_SITE_OTHER): Payer: Medicare Other | Admitting: General Surgery

## 2012-12-08 ENCOUNTER — Encounter (INDEPENDENT_AMBULATORY_CARE_PROVIDER_SITE_OTHER): Payer: Self-pay | Admitting: General Surgery

## 2012-12-08 VITALS — BP 120/78 | HR 72 | Temp 97.8°F | Resp 12 | Ht 63.0 in | Wt 136.0 lb

## 2012-12-08 DIAGNOSIS — C50919 Malignant neoplasm of unspecified site of unspecified female breast: Secondary | ICD-10-CM

## 2012-12-08 DIAGNOSIS — C50911 Malignant neoplasm of unspecified site of right female breast: Secondary | ICD-10-CM

## 2012-12-08 NOTE — Progress Notes (Signed)
Patient ID: Janice Powell, female   DOB: 12/26/1945, 67 y.o.   MRN: [id number] History: This patient returns to see me regarding her locally advanced cancer of the right breast in the neoadjuvant setting. She thinks that the right breast tumor is a little bit smaller.     This patient noticed a right breast mass in 2011. 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 More recently, she brought it to Dr. Carolee Rota attention. Diagnostic mammography and right ultrasonography at [facility] 03/02/2012 showed a 6.7 cm irregular mass in the inner aspect of the right breast. There was nipple retraction and erythema around the nipple. A large lymph node was noted in the right axilla. By ultrasound the large irregular hypoechoic mass was noted in the breast with multiple enlarged axillary lymph nodes. Physical exam confirmed a hard breast with erythema around the right nipple extending inferiorly. The nipple is slightly inverted. Palpable axillary nodes were noted.  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 Her subsequent history is detailed below  She had previously been evaluated by Dr. Chevis Pretty on Aug. 30, 2013 and had declined any surgical intervention or other treatment. .  She initially declined chemotherapy, but reconsidered And I placed a Port-A-Cath on 08/15/2012.  She has been receiving neoadjuvant chemotherapy. She has now had 5/6 cycles of a planned 6 cycles. The final cycle is scheduled for June 3.They think that she is responding. She is here today for an interval exam.     Mid treatment MRI on April 3 shows a 7.5 cm tumor in the right, involving the skin was mildly enlarged right axillary lymph nodes felt to be positive.  ROS: 10 systems review of systems is negative except as described above.  Exam: Patient is alert. In no distress. Vital signs normal. Port-A-Cath site left  infraclavicular area looks good. Right breast is small. There is a centralized mass with a destruction of the areola and nipple by the tumor. There is some splotchy erythema surrounding this presumed skin involvement. The tumor is mobile. I can feel a couple small lymph nodes in the right axilla Neck: No adenopathy mass or wheezes Heart: Regular rate and rhythm. No ectopy Lungs: Clinical sedation bilaterally.  Assessment: Locally advanced cancer right breast, clinical stage TIV, and 2, stage III, triple negative breast cancer Status post Port-A-Cath insertion without complications Some response to neoadjuvant chemotherapy. I am not sure whether I can do a mastectomy area clean margins with primary closure. I suspect we will be able to eventually do a mastectomy, but we may have to consider latissimus flap coverage. Hypertension GERD  Plan: She will complete her sixth cycle of chemotherapy on June 6. I presume that oncology will schedule an end of treatment MRI, sometime in mid June excised she will see me in mid to late June after the MRI discuss treatment surgically. Ultimately, the surgical goal is a right modified radical mastectomy in with primary skin closure, and if not, latissimus flap closure.   Angelia Mould. Derrell Lolling, M.D., [facility] General and Minimally invasive Surgery Breast and Colorectal Surgery Office:   [phone] Pager:   [phone]

## 2012-12-08 NOTE — Patient Instructions (Signed)
I think that the cancer in your right breast continues to slowly respond to the chemotherapy. It appears it appears a little bit softer and a little bit smaller. There is still a lot of skin involvement.  Complete your last chemotherapy treatment on June 3.  Dr. Darnelle Catalan we'll schedule used for an end of treatment MRI about 2 weeks after the induction chemotherapy.  Return to see Dr. Derrell Lolling after the MRI is done, in mid to late June. We will discuss final surgical plans at that time.

## 2012-12-13 ENCOUNTER — Other Ambulatory Visit (HOSPITAL_BASED_OUTPATIENT_CLINIC_OR_DEPARTMENT_OTHER): Payer: Medicare Other | Admitting: Lab

## 2012-12-13 ENCOUNTER — Encounter: Payer: Self-pay | Admitting: Physician Assistant

## 2012-12-13 ENCOUNTER — Ambulatory Visit (HOSPITAL_BASED_OUTPATIENT_CLINIC_OR_DEPARTMENT_OTHER): Payer: Medicare Other | Admitting: Physician Assistant

## 2012-12-13 ENCOUNTER — Telehealth: Payer: Self-pay | Admitting: Oncology

## 2012-12-13 VITALS — BP 179/97 | HR 65 | Temp 97.9°F | Resp 20 | Ht 63.0 in | Wt 133.8 lb

## 2012-12-13 DIAGNOSIS — C773 Secondary and unspecified malignant neoplasm of axilla and upper limb lymph nodes: Secondary | ICD-10-CM | POA: Diagnosis not present

## 2012-12-13 DIAGNOSIS — M949 Disorder of cartilage, unspecified: Secondary | ICD-10-CM | POA: Diagnosis not present

## 2012-12-13 DIAGNOSIS — C50319 Malignant neoplasm of lower-inner quadrant of unspecified female breast: Secondary | ICD-10-CM

## 2012-12-13 DIAGNOSIS — D702 Other drug-induced agranulocytosis: Secondary | ICD-10-CM

## 2012-12-13 DIAGNOSIS — C50919 Malignant neoplasm of unspecified site of unspecified female breast: Secondary | ICD-10-CM

## 2012-12-13 DIAGNOSIS — C50911 Malignant neoplasm of unspecified site of right female breast: Secondary | ICD-10-CM

## 2012-12-13 DIAGNOSIS — I10 Essential (primary) hypertension: Secondary | ICD-10-CM | POA: Diagnosis not present

## 2012-12-13 DIAGNOSIS — M899 Disorder of bone, unspecified: Secondary | ICD-10-CM

## 2012-12-13 DIAGNOSIS — E876 Hypokalemia: Secondary | ICD-10-CM | POA: Insufficient documentation

## 2012-12-13 LAB — CBC WITH DIFFERENTIAL/PLATELET
Eosinophils Absolute: 0 10*3/uL (ref 0.0–0.5)
LYMPH%: 47.4 % (ref 14.0–49.7)
MONO#: 0.1 10*3/uL (ref 0.1–0.9)
NEUT#: 0.7 10*3/uL — ABNORMAL LOW (ref 1.5–6.5)
Platelets: 128 10*3/uL — ABNORMAL LOW (ref 145–400)
RBC: 3.58 10*6/uL — ABNORMAL LOW (ref 3.70–5.45)
RDW: 14.9 % — ABNORMAL HIGH (ref 11.2–14.5)
WBC: 1.5 10*3/uL — ABNORMAL LOW (ref 3.9–10.3)

## 2012-12-13 LAB — COMPREHENSIVE METABOLIC PANEL (CC13)
Albumin: 3.4 g/dL — ABNORMAL LOW (ref 3.5–5.0)
CO2: 28 mEq/L (ref 22–29)
Calcium: 8.7 mg/dL (ref 8.4–10.4)
Chloride: 106 mEq/L (ref 98–107)
Glucose: 129 mg/dl — ABNORMAL HIGH (ref 70–99)
Potassium: 3.8 mEq/L (ref 3.5–5.1)
Sodium: 141 mEq/L (ref 136–145)
Total Protein: 6.4 g/dL (ref 6.4–8.3)

## 2012-12-13 NOTE — Progress Notes (Signed)
ID: Janice Powell   DOB: 1946-01-30  MR#: [id number]  CSN#:[id number]  PCP: Londell Moh, MD GYN:  SUClaud Kelp MD OTHER MD:   HISTORY OF PRESENT ILLNESS: Janice Powell noted a mass in her right breast sometime in 2011. 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 More recently her husband twisted her arm to get the mass looked at, and she brought it to Dr. Carolee Rota attention. Diagnostic mammography and right ultrasonography at [facility] 03/02/2012 showed a 6.78 cm irregular mass in the inner aspect of the right breast. There was nipple retraction and erythema around the nipple. The largest lymph node was noted in the right axilla. By ultrasound the large irregular hypoechoic mass was noted in the breast with multiple enlarged axillary lymph nodes. Physical exam confirmed a hard breast with erythema around the right nipple extending inferiorly. The nipple is slightly inverted.  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 Her subsequent history is as detailed below  INTERVAL HISTORY: Janice Powell returns today for follow up of her locally advanced right breast cancer.  She is currently day 8 cycle 5 of doxorubicin/cyclophosphamide, given every 3 weeks, with docetaxel having been discontinued due to poor tolerance. She did receive Neulasta with this cycle on day 2.  Janice Powell tells me she is "feeling fine" and has no new complaints. Interval history is remarkable for her having met with Dr. Derrell Lolling, and she will see him again in late June after completion of chemotherapy to discuss definitive surgery.  REVIEW OF SYSTEMS: Janice Powell  denies any fevers, chills, or night sweats. She's had no rashes, abnormal bruising, or bleeding. Her appetite is good. She had some fleeting nausea, but no emesis, and this has resolved. She had mild constipation which has resolved. She denies any cough,  shortness of breath, chest pain, or palpitations. She's had no abnormal headaches or dizziness. She had some mild bony pain following the Neulasta injection which has improved, and she denies any additional pain. No peripheral swelling, and no evidence of peripheral neuropathy.  A detailed review of systems is otherwise stable and noncontributory today.   PAST MEDICAL HISTORY: Past Medical History  Diagnosis Date  . Hypertension   . Cancer     right breast  . Pneumonia     hx  . GERD (gastroesophageal reflux disease)     occ  . Osteopenia   GERD osteopenia  PAST SURGICAL HISTORY: Past Surgical History  Procedure Laterality Date  . Appendectomy  1992  . Tubal ligation  1979  . Cholecystectomy  2000  . Portacath placement  08/15/2012    Procedure: INSERTION PORT-A-CATH;  Surgeon: Ernestene Mention, MD;  Location: [facility] OR;  Service: General;  Laterality: Left;    FAMILY HISTORY Family History  Problem Relation Age of Onset  . Heart disease Mother    the patient's father died in his sleep at age 53. The patient's mother died at the age of 52 from a myocardial infarction. The patient has one brother and 2 sisters, all surviving. There is no history of breast or ovarian cancer in the family.  GYNECOLOGIC HISTORY: Menarche age 56, first live birth age 43, she is GX P5, menopause age 8. She did not take hormone replacement.  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 Her husband Janice Powell worked in  the post office 41 years, and is also now retired. He is a former Arts development officer. Son Janice Powell lives in [place] and manages an [employer] store. Daughter Janice Powell also lives in [place] and manages the IT Department at [employer]. Son Janice Powell is a Therapist, sports. Daughter Janice Powell is a Doctor, hospital for [employer]. Son Janice Powell is an Teaching laboratory technician. The patient has 8 grandchildren. She attends the white [employer]   ADVANCED DIRECTIVES: Not in place  HEALTH MAINTENANCE: History  Substance Use Topics  . Smoking status: Never Smoker   . Smokeless tobacco: Never Used  . Alcohol Use: No     Colonoscopy:  PAP:  Bone density:  January 2011  Lipid panel:  No Known Allergies  Current Outpatient Prescriptions  Medication Sig Dispense Refill  . dexamethasone (DECADRON) 4 MG tablet Take 2 tablets by mouth twice a day as directed  30 tablet  3  . HYDROcodone-acetaminophen (NORCO/VICODIN) 5-325 MG per tablet Take 1-2 tablets by mouth every 4 (four) hours as needed for pain.  50 tablet  1  . lidocaine-prilocaine (EMLA) cream Apply 1 application topically as needed.       Marland Kitchen lisinopril (PRINIVIL) 10 MG tablet Take 1 tablet (10 mg total) by mouth daily.  30 tablet  12  . loperamide (IMODIUM) 2 MG capsule Take 1 capsule (2 mg total) by mouth 4 (four) times daily as needed for diarrhea or loose stools.  12 capsule  0  . LORazepam (ATIVAN) 0.5 MG tablet Take 1 tablet (0.5 mg total) by mouth at bedtime as needed for anxiety.  30 tablet  0  . metoprolol succinate (TOPROL XL) 50 MG 24 hr tablet Take 1 tablet (50 mg total) by mouth daily. Take with or immediately following a meal.  90 tablet  0  . ondansetron (ZOFRAN) 8 MG tablet Take 1 tablet (8 mg total) by mouth every 12 (twelve) hours as needed for nausea.  30 tablet  1  . potassium chloride SA (K-DUR,KLOR-CON) 20 MEQ tablet Starting 12/07/2012 take 1 tablet qid x 5 days then resume bid dose.  70 tablet  6  . prochlorperazine (COMPAZINE) 10 MG tablet Take 10 mg by mouth every 6 (six) hours as needed. Nausea       No current facility-administered medications for this visit.    OBJECTIVE: Middle-aged [place] American woman in no acute distress Filed Vitals:   12/13/12 1024  BP: 179/97  Pulse: 65  Temp: 97.9 F (36.6 C)  Resp: 20     Body mass index is 23.71 kg/(m^2).    ECOG FS: 1 Filed Weights   12/13/12 1024  Weight: 133 lb 12.8 oz (60.691 kg)    Sclerae unicteric Oropharynx clear, no visible ulcerations No cervical or supraclavicular adenopathy  Lungs clear to auscultation, no wheezes or rhonchi  Heart regular rate and rhythm Abdomen  soft, nontender to palpation, positive bowel sounds MSK no focal spinal tenderness No peripheral edema Neuro: nonfocal, well oriented, pleasant affect Breasts:  Deferred   LAB RESULTS: CBC    Component Value Date/Time   WBC 1.5* 12/13/2012 1013   WBC 5.1 09/01/2012 0319   RBC 3.58* 12/13/2012 1013   RBC 4.19 09/01/2012 0319   HGB 11.6 12/13/2012 1013   HGB 12.9 09/01/2012 0502   HCT 35.0 12/13/2012 1013   HCT 38.0 09/01/2012 0502   PLT 128* 12/13/2012 1013   PLT 164 09/01/2012 0319   MCV 97.8 12/13/2012 1013   MCV 91.9 09/01/2012 0319  MCH 32.4 12/13/2012 1013   MCH 30.8 09/01/2012 0319   MCHC 33.1 12/13/2012 1013   MCHC 33.5 09/01/2012 0319   RDW 14.9* 12/13/2012 1013   RDW 13.6 09/01/2012 0319   LYMPHSABS 0.7* 12/13/2012 1013   LYMPHSABS 0.9 09/01/2012 0319   MONOABS 0.1 12/13/2012 1013   MONOABS 0.7 09/01/2012 0319   EOSABS 0.0 12/13/2012 1013   EOSABS 0.1 09/01/2012 0319   BASOSABS 0.0 12/13/2012 1013   BASOSABS 0.1 09/01/2012 0319    CMP     Component Value Date/Time   NA 143 12/06/2012 1008   NA 139 09/01/2012 0502   K 2.9 Repeated and Verified* 12/06/2012 1008   K 3.3* 09/01/2012 0502   CL 105 12/06/2012 1008   CL 102 09/01/2012 0502   CO2 28 12/06/2012 1008   CO2 25 09/01/2012 0319   GLUCOSE 104* 12/06/2012 1008   GLUCOSE 113* 09/01/2012 0502   BUN 12.1 12/06/2012 1008   BUN 8 09/01/2012 0502   CREATININE 0.7 12/06/2012 1008   CREATININE 0.80 09/01/2012 0502   CALCIUM 9.0 12/06/2012 1008   CALCIUM 8.6 09/01/2012 0319   PROT 6.9 12/06/2012 1008   PROT 6.9 09/01/2012 0319   ALBUMIN 3.7 12/06/2012 1008   ALBUMIN 3.1* 09/01/2012 0319   AST 15 12/06/2012 1008   AST 21 09/01/2012 0319   ALT 16 12/06/2012 1008   ALT 14 09/01/2012 0319   ALKPHOS 65 12/06/2012 1008   ALKPHOS 47 09/01/2012 0319   BILITOT 0.33 12/06/2012 1008    BILITOT 0.5 09/01/2012 0319   GFRNONAA >90 09/01/2012 0319   GFRAA >90 09/01/2012 0319       STUDIES:  Echocardiogram on 08/19/2012 showed an ejection fraction of 60-65%.   ASSESSMENT: 67 y.o. [place] woman   (1)  s/p Right breast and axillary node biopsy 03/08/2012 for a clinical T4, N1-2', stage Powell invasive ductal carcinoma, grade 3, triple negative, with an MIB-1 of 93%.  (2)  being treated in the neoadjuvant setting, the original goal being to complete 6 cycles of docetaxel/doxorubicin/cyclophosphamide given every 3 weeks, with patient initially declining Neulasta injections after cycle 1.  Docetaxel was dropped beginning cycle 3 due to poor tolerance, but continuing with doxorubicin/cyclophosphamide every 3 weeks.  (3) hypertension, poorly controlled  PLAN: Janice Powell continues to tolerate chemotherapy well. We again reviewed neutropenic precautions, and she'll be on Cipro prophylactically, 500 mg by mouth twice a day for the next 7 days. She knows to call us with any fevers 100 or above.  I will see Janice Powell back in 2 weeks on June 3 in anticipation of her sixth and final dose of neoadjuvant chemotherapy. We will repeat her breast MRI in late June, prior to her appointment with Dr. Derrell Lolling on June 27 to discuss definitive surgery. She can also have her port removed at the time of surgery. She voices understanding and agreement with this plan, and will call with any changes or problems prior to her next scheduled appointment.   Djimon Lundstrom    12/13/2012

## 2012-12-13 NOTE — Telephone Encounter (Signed)
, °

## 2012-12-27 ENCOUNTER — Ambulatory Visit (HOSPITAL_BASED_OUTPATIENT_CLINIC_OR_DEPARTMENT_OTHER): Payer: Medicare Other

## 2012-12-27 ENCOUNTER — Ambulatory Visit (HOSPITAL_BASED_OUTPATIENT_CLINIC_OR_DEPARTMENT_OTHER): Payer: Medicare Other | Admitting: Physician Assistant

## 2012-12-27 ENCOUNTER — Encounter: Payer: Self-pay | Admitting: Physician Assistant

## 2012-12-27 ENCOUNTER — Other Ambulatory Visit (HOSPITAL_BASED_OUTPATIENT_CLINIC_OR_DEPARTMENT_OTHER): Payer: Medicare Other | Admitting: Lab

## 2012-12-27 ENCOUNTER — Other Ambulatory Visit: Payer: Self-pay | Admitting: Physician Assistant

## 2012-12-27 VITALS — BP 179/80 | HR 65 | Temp 97.6°F | Resp 20 | Ht 63.0 in | Wt 134.6 lb

## 2012-12-27 DIAGNOSIS — C50319 Malignant neoplasm of lower-inner quadrant of unspecified female breast: Secondary | ICD-10-CM

## 2012-12-27 DIAGNOSIS — Z171 Estrogen receptor negative status [ER-]: Secondary | ICD-10-CM | POA: Diagnosis not present

## 2012-12-27 DIAGNOSIS — I10 Essential (primary) hypertension: Secondary | ICD-10-CM

## 2012-12-27 DIAGNOSIS — C773 Secondary and unspecified malignant neoplasm of axilla and upper limb lymph nodes: Secondary | ICD-10-CM

## 2012-12-27 DIAGNOSIS — Z5111 Encounter for antineoplastic chemotherapy: Secondary | ICD-10-CM

## 2012-12-27 DIAGNOSIS — C50919 Malignant neoplasm of unspecified site of unspecified female breast: Secondary | ICD-10-CM

## 2012-12-27 DIAGNOSIS — E876 Hypokalemia: Secondary | ICD-10-CM

## 2012-12-27 DIAGNOSIS — C50911 Malignant neoplasm of unspecified site of right female breast: Secondary | ICD-10-CM

## 2012-12-27 DIAGNOSIS — C801 Malignant (primary) neoplasm, unspecified: Secondary | ICD-10-CM

## 2012-12-27 LAB — COMPREHENSIVE METABOLIC PANEL (CC13)
ALT: 23 U/L (ref 0–55)
Albumin: 3.6 g/dL (ref 3.5–5.0)
Alkaline Phosphatase: 74 U/L (ref 40–150)
CO2: 28 mEq/L (ref 22–29)
Glucose: 85 mg/dl (ref 70–99)
Potassium: 3 mEq/L — CL (ref 3.5–5.1)
Sodium: 144 mEq/L (ref 136–145)
Total Protein: 6.3 g/dL — ABNORMAL LOW (ref 6.4–8.3)

## 2012-12-27 LAB — CBC WITH DIFFERENTIAL/PLATELET
Basophils Absolute: 0 10*3/uL (ref 0.0–0.1)
Eosinophils Absolute: 0 10*3/uL (ref 0.0–0.5)
HGB: 11.5 g/dL — ABNORMAL LOW (ref 11.6–15.9)
NEUT#: 5 10*3/uL (ref 1.5–6.5)
RBC: 3.61 10*6/uL — ABNORMAL LOW (ref 3.70–5.45)
RDW: 16.1 % — ABNORMAL HIGH (ref 11.2–14.5)
WBC: 7.4 10*3/uL (ref 3.9–10.3)
lymph#: 1.3 10*3/uL (ref 0.9–3.3)
nRBC: 0 % (ref 0–0)

## 2012-12-27 MED ORDER — HEPARIN SOD (PORK) LOCK FLUSH 100 UNIT/ML IV SOLN
500.0000 [IU] | Freq: Once | INTRAVENOUS | Status: AC | PRN
  Administered 2012-12-27: 500 [IU]
  Filled 2012-12-27: qty 5

## 2012-12-27 MED ORDER — FOSAPREPITANT DIMEGLUMINE INJECTION 150 MG
150.0000 mg | Freq: Once | INTRAVENOUS | Status: AC
  Administered 2012-12-27: 150 mg via INTRAVENOUS
  Filled 2012-12-27: qty 5

## 2012-12-27 MED ORDER — SODIUM CHLORIDE 0.9 % IJ SOLN
10.0000 mL | INTRAMUSCULAR | Status: DC | PRN
  Administered 2012-12-27: 10 mL
  Filled 2012-12-27: qty 10

## 2012-12-27 MED ORDER — PALONOSETRON HCL INJECTION 0.25 MG/5ML
0.2500 mg | Freq: Once | INTRAVENOUS | Status: AC
  Administered 2012-12-27: 0.25 mg via INTRAVENOUS

## 2012-12-27 MED ORDER — SODIUM CHLORIDE 0.9 % IV SOLN
Freq: Once | INTRAVENOUS | Status: AC
  Administered 2012-12-27: 13:00:00 via INTRAVENOUS

## 2012-12-27 MED ORDER — DEXAMETHASONE SODIUM PHOSPHATE 20 MG/5ML IJ SOLN
12.0000 mg | Freq: Once | INTRAMUSCULAR | Status: AC
  Administered 2012-12-27: 12 mg via INTRAVENOUS

## 2012-12-27 MED ORDER — SODIUM CHLORIDE 0.9 % IV SOLN
500.0000 mg/m2 | Freq: Once | INTRAVENOUS | Status: AC
  Administered 2012-12-27: 840 mg via INTRAVENOUS
  Filled 2012-12-27: qty 42

## 2012-12-27 MED ORDER — LORAZEPAM 2 MG/ML IJ SOLN
0.5000 mg | Freq: Once | INTRAMUSCULAR | Status: AC
  Administered 2012-12-27: 0.5 mg via INTRAVENOUS

## 2012-12-27 MED ORDER — DOXORUBICIN HCL CHEMO IV INJECTION 2 MG/ML
50.0000 mg/m2 | Freq: Once | INTRAVENOUS | Status: AC
  Administered 2012-12-27: 84 mg via INTRAVENOUS
  Filled 2012-12-27: qty 42

## 2012-12-27 NOTE — Progress Notes (Signed)
CMET today showed a recurring hypokalemia, with a potassium level of 3.0 today. I have requested that Janice Powell increase her potassium supplementation once again, to 20 mEq 4 times daily. I will recheck her potassium when she returns next week for followup.  Zollie Scale, PA-C 12/27/2012

## 2012-12-27 NOTE — Progress Notes (Signed)
ID: Janice Powell   DOB: [birth year]  MR#: [id number]  CSN#:[id number]  PCP: Londell Moh, MD GYN:  SUClaud Kelp MD OTHER MD:   HISTORY OF PRESENT ILLNESS: Janice Powell noted a mass in her right breast sometime in 2011. 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 More recently her husband twisted her arm to get the mass looked at, and she brought it to Dr. Carolee Rota attention. Diagnostic mammography and right ultrasonography at [facility] 03/02/2012 showed a 6.78 cm irregular mass in the inner aspect of the right breast. There was nipple retraction and erythema around the nipple. The largest lymph node was noted in the right axilla. By ultrasound the large irregular hypoechoic mass was noted in the breast with multiple enlarged axillary lymph nodes. Physical exam confirmed a hard breast with erythema around the right nipple extending inferiorly. The nipple is slightly inverted.  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 Her subsequent history is as detailed below  INTERVAL HISTORY: Janice Powell returns today for follow up of her locally advanced right breast cancer.  She is due for her sixth and final dose of doxorubicin/cyclophosphamide, given every 3 weeks, with docetaxel having been discontinued due to poor tolerance.   Janice Powell is very excited to complete her neoadjuvant chemotherapy today. Physically, her only complaint is mild fatigue, and some pain in the right leg which she attributes to arthritis. She's had no swelling at all in the right leg, and denies any erythema or skin changes. She takes Aleve which decreases the pain.   REVIEW OF SYSTEMS: Janice Powell  denies any fevers, chills, or night sweats. She's had no rashes, abnormal bruising, or bleeding. Her appetite is good, and she denies any recent nausea, emesis, or change in bowel or bladder habits. She denies any  cough, shortness of breath, chest pain, or palpitations. She's had no abnormal headaches or dizziness. Other than the pain in the right leg discussed above, she has no additional myalgias, arthralgias, or bony pain. No peripheral swelling, and no evidence of peripheral neuropathy.  A detailed review of systems is otherwise stable and noncontributory today.   PAST MEDICAL HISTORY: Past Medical History  Diagnosis Date  . Hypertension   . Cancer     right breast  . Pneumonia     hx  . GERD (gastroesophageal reflux disease)     occ  . Osteopenia   GERD osteopenia  PAST SURGICAL HISTORY: Past Surgical History  Procedure Laterality Date  . Appendectomy  1992  . Tubal ligation  1979  . Cholecystectomy  2000  . Portacath placement  08/15/2012    Procedure: INSERTION PORT-A-CATH;  Surgeon: Ernestene Mention, MD;  Location: [facility] OR;  Service: General;  Laterality: Left;    FAMILY HISTORY Family History  Problem Relation Age of Onset  . Heart disease Mother    the patient's father died in his sleep at age 81. The patient's mother died at the age of 67 from a myocardial infarction. The patient has one brother and 2 sisters, all surviving. There is no history of breast or ovarian cancer in the family.  GYNECOLOGIC HISTORY: Menarche age 78, first live birth age 45, she is GX P5, menopause age 18. She did not take hormone replacement.  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 Her husband Janice Powell  worked in the post office 41 years, and is also now retired. He is a former Arts development officer. Son Janice Powell lives in [place] and manages an [employer] store. Daughter Janice Powell also lives in [place] and manages the IT Department at [employer]. Son Janice Powell is a Therapist, sports. Daughter Janice Powell is a Doctor, hospital for [employer]. Son Janice Powell is an Teaching laboratory technician. The patient has 8 grandchildren. She attends the white [employer]   ADVANCED DIRECTIVES: Not in place  HEALTH MAINTENANCE: History  Substance Use Topics  . Smoking status: Never Smoker   . Smokeless tobacco: Never Used  . Alcohol Use: No     Colonoscopy:  PAP:  Bone density:  January 2011  Lipid panel:  No Known Allergies  Current Outpatient Prescriptions  Medication Sig Dispense Refill  . dexamethasone (DECADRON) 4 MG tablet Take 2 tablets by mouth twice a day as directed  30 tablet  3  . HYDROcodone-acetaminophen (NORCO/VICODIN) 5-325 MG per tablet Take 1-2 tablets by mouth every 4 (four) hours as needed for pain.  50 tablet  1  . lidocaine-prilocaine (EMLA) cream Apply 1 application topically as needed.       Marland Kitchen lisinopril (PRINIVIL) 10 MG tablet Take 1 tablet (10 mg total) by mouth daily.  30 tablet  12  . loperamide (IMODIUM) 2 MG capsule Take 1 capsule (2 mg total) by mouth 4 (four) times daily as needed for diarrhea or loose stools.  12 capsule  0  . LORazepam (ATIVAN) 0.5 MG tablet Take 1 tablet (0.5 mg total) by mouth at bedtime as needed for anxiety.  30 tablet  0  . metoprolol succinate (TOPROL XL) 50 MG 24 hr tablet Take 1 tablet (50 mg total) by mouth daily. Take with or immediately following a meal.  90 tablet  0  . ondansetron (ZOFRAN) 8 MG tablet Take 1 tablet (8 mg total) by mouth every 12 (twelve) hours as needed for nausea.  30 tablet  1  . potassium chloride SA (K-DUR,KLOR-CON) 20 MEQ tablet Starting 12/07/2012 take 1 tablet qid x 5 days then resume bid dose.  70 tablet  6  . prochlorperazine (COMPAZINE) 10 MG tablet Take 10 mg by mouth every 6 (six) hours as needed. Nausea       No current facility-administered medications for this visit.   Facility-Administered Medications Ordered in Other Visits  Medication Dose Route Frequency Provider Last Rate Last Dose  . cyclophosphamide (CYTOXAN) 840 mg in sodium chloride 0.9 % 250 mL chemo infusion  500 mg/m2 (Treatment Plan Actual) Intravenous Once Lowella Dell, MD      .  Dexamethasone Sodium Phosphate (DECADRON) injection 12 mg  12 mg Intravenous Once Lowella Dell, MD      . DOXOrubicin (ADRIAMYCIN) chemo injection 84 mg  50 mg/m2 (Treatment Plan Actual) Intravenous Once Lowella Dell, MD      . fosaprepitant (EMEND) 150 mg in sodium chloride 0.9 % 145 mL IVPB  150 mg Intravenous Once Lowella Dell, MD      . heparin lock flush 100 unit/mL  500 Units Intracatheter Once PRN Lowella Dell, MD      . LORazepam (ATIVAN) injection 0.5 mg  0.5 mg Intravenous Once Lowella Dell, MD      . palonosetron (ALOXI) injection 0.25 mg  0.25 mg Intravenous Once Lowella Dell, MD      . sodium chloride 0.9 % injection 10 mL  10 mL Intracatheter PRN Valentino Hue  Magrinat, MD        OBJECTIVE: Middle-aged African American woman in no acute distress Filed Vitals:   12/27/12 1137  BP: 179/80  Pulse: 65  Temp: 97.6 F (36.4 C)  Resp: 20     Body mass index is 23.85 kg/(m^2).    ECOG FS: 1 Filed Weights   12/27/12 1137  Weight: 134 lb 9.6 oz (61.054 kg)   Sclerae unicteric Oropharynx clear, no ulcerations No cervical or supraclavicular adenopathy  Lungs clear to auscultation, no wheezes or rhonchi  Heart regular rate and rhythm Abdomen  soft, nontender to palpation, positive bowel sounds MSK no focal spinal tenderness to palpation No peripheral edema Neuro: nonfocal, well oriented, pleasant affect Breasts:  Right breast seems softer than when previously examined, but still with a palpable mass in the central portion of the breast and necrotic tissue covering the areola and nipple. Mild erythema noted, surrounding the areolar complex. Left breast is unremarkable. Port is intact the left upper chest wall with no erythema or edema.   LAB RESULTS: CBC    Component Value Date/Time   WBC 7.4 12/27/2012 1056   WBC 5.1 09/01/2012 0319   RBC 3.61* 12/27/2012 1056   RBC 4.19 09/01/2012 0319   HGB 11.5* 12/27/2012 1056   HGB 12.9 09/01/2012 0502   HCT 35.5  12/27/2012 1056   HCT 38.0 09/01/2012 0502   PLT 288 12/27/2012 1056   PLT 164 09/01/2012 0319   MCV 98.3 12/27/2012 1056   MCV 91.9 09/01/2012 0319   MCH 31.9 12/27/2012 1056   MCH 30.8 09/01/2012 0319   MCHC 32.4 12/27/2012 1056   MCHC 33.5 09/01/2012 0319   RDW 16.1* 12/27/2012 1056   RDW 13.6 09/01/2012 0319   LYMPHSABS 1.3 12/27/2012 1056   LYMPHSABS 0.9 09/01/2012 0319   MONOABS 1.0* 12/27/2012 1056   MONOABS 0.7 09/01/2012 0319   EOSABS 0.0 12/27/2012 1056   EOSABS 0.1 09/01/2012 0319   BASOSABS 0.0 12/27/2012 1056   BASOSABS 0.1 09/01/2012 0319    CMP     Component Value Date/Time   NA 141 12/13/2012 1013   NA 139 09/01/2012 0502   K 3.8 12/13/2012 1013   K 3.3* 09/01/2012 0502   CL 106 12/13/2012 1013   CL 102 09/01/2012 0502   CO2 28 12/13/2012 1013   CO2 25 09/01/2012 0319   GLUCOSE 129* 12/13/2012 1013   GLUCOSE 113* 09/01/2012 0502   BUN 11.6 12/13/2012 1013   BUN 8 09/01/2012 0502   CREATININE 0.7 12/13/2012 1013   CREATININE 0.80 09/01/2012 0502   CALCIUM 8.7 12/13/2012 1013   CALCIUM 8.6 09/01/2012 0319   PROT 6.4 12/13/2012 1013   PROT 6.9 09/01/2012 0319   ALBUMIN 3.4* 12/13/2012 1013   ALBUMIN 3.1* 09/01/2012 0319   AST 12 12/13/2012 1013   AST 21 09/01/2012 0319   ALT 13 12/13/2012 1013   ALT 14 09/01/2012 0319   ALKPHOS 80 12/13/2012 1013   ALKPHOS 47 09/01/2012 0319   BILITOT 0.82 12/13/2012 1013   BILITOT 0.5 09/01/2012 0319   GFRNONAA >90 09/01/2012 0319   GFRAA >90 09/01/2012 0319       STUDIES:  Echocardiogram on 08/19/2012 showed an ejection fraction of 60-65%.   ASSESSMENT: 67 y.o. [place] woman   (1)  s/p Right breast and axillary node biopsy 03/08/2012 for a clinical T4, N1-2', stage Powell invasive ductal carcinoma, grade 3, triple negative, with an MIB-1 of 93%.  (2)  being treated in the neoadjuvant setting,  the original goal being to complete 6 cycles of docetaxel/doxorubicin/cyclophosphamide given every 3 weeks, with patient initially declining Neulasta injections after cycle 1.  Docetaxel was  dropped beginning cycle 3 due to poor tolerance, but continuing with doxorubicin/cyclophosphamide every 3 weeks.  (3) hypertension, poorly controlled  PLAN: Jaquelyne receive her sixth and final neoadjuvant dose of doxorubicin/cyclophosphamide today. I will see her again next week for assessment of chemotoxicity on June 10. She is already scheduled for repeat breast MRI later this month, after which she will meet with Dr. Derrell Lolling to discuss her upcoming surgery.  She can also have her port removed at the time of surgery. She will see Dr. Darnelle Catalan in early July for brief followup.   She voices understanding and agreement with this plan, and will call with any changes or problems prior to her next scheduled appointment.   Abdulahi Schor    12/27/2012

## 2012-12-27 NOTE — Patient Instructions (Addendum)
[facility] Discharge Instructions for Patients Receiving Chemotherapy  Today you received the following chemotherapy agents Adriamycin and Cytoxan.  To help prevent nausea and vomiting after your treatment, we encourage you to take your nausea medication.   If you develop nausea and vomiting that is not controlled by your nausea medication, call the clinic.   BELOW ARE SYMPTOMS THAT SHOULD BE REPORTED IMMEDIATELY:  *FEVER GREATER THAN 100.5 F  *CHILLS WITH OR WITHOUT FEVER  NAUSEA AND VOMITING THAT IS NOT CONTROLLED WITH YOUR NAUSEA MEDICATION  *UNUSUAL SHORTNESS OF BREATH  *UNUSUAL BRUISING OR BLEEDING  TENDERNESS IN MOUTH AND THROAT WITH OR WITHOUT PRESENCE OF ULCERS  *URINARY PROBLEMS  *BOWEL PROBLEMS  UNUSUAL RASH Items with * indicate a potential emergency and should be followed up as soon as possible.  Feel free to call the clinic you have any questions or concerns. The clinic phone number is [phone].    

## 2012-12-28 ENCOUNTER — Ambulatory Visit (HOSPITAL_BASED_OUTPATIENT_CLINIC_OR_DEPARTMENT_OTHER): Payer: Medicare Other

## 2012-12-28 VITALS — BP 182/86 | HR 63 | Temp 97.6°F | Resp 20

## 2012-12-28 DIAGNOSIS — C773 Secondary and unspecified malignant neoplasm of axilla and upper limb lymph nodes: Secondary | ICD-10-CM

## 2012-12-28 DIAGNOSIS — C801 Malignant (primary) neoplasm, unspecified: Secondary | ICD-10-CM

## 2012-12-28 DIAGNOSIS — C50319 Malignant neoplasm of lower-inner quadrant of unspecified female breast: Secondary | ICD-10-CM

## 2012-12-28 DIAGNOSIS — C50911 Malignant neoplasm of unspecified site of right female breast: Secondary | ICD-10-CM

## 2012-12-28 MED ORDER — PEGFILGRASTIM INJECTION 6 MG/0.6ML
6.0000 mg | Freq: Once | SUBCUTANEOUS | Status: AC
Start: 2012-12-28 — End: 2012-12-28
  Administered 2012-12-28: 6 mg via SUBCUTANEOUS
  Filled 2012-12-28: qty 0.6

## 2012-12-28 NOTE — Patient Instructions (Signed)
Call MD for problems 

## 2013-01-03 ENCOUNTER — Other Ambulatory Visit (HOSPITAL_BASED_OUTPATIENT_CLINIC_OR_DEPARTMENT_OTHER): Payer: Medicare Other | Admitting: Lab

## 2013-01-03 ENCOUNTER — Ambulatory Visit (HOSPITAL_BASED_OUTPATIENT_CLINIC_OR_DEPARTMENT_OTHER): Payer: Medicare Other | Admitting: Physician Assistant

## 2013-01-03 ENCOUNTER — Telehealth: Payer: Self-pay | Admitting: *Deleted

## 2013-01-03 ENCOUNTER — Encounter: Payer: Self-pay | Admitting: Physician Assistant

## 2013-01-03 VITALS — BP 184/78 | HR 73 | Temp 98.7°F | Resp 20 | Ht 63.0 in | Wt 135.0 lb

## 2013-01-03 DIAGNOSIS — E876 Hypokalemia: Secondary | ICD-10-CM | POA: Diagnosis not present

## 2013-01-03 DIAGNOSIS — C50919 Malignant neoplasm of unspecified site of unspecified female breast: Secondary | ICD-10-CM

## 2013-01-03 DIAGNOSIS — D702 Other drug-induced agranulocytosis: Secondary | ICD-10-CM

## 2013-01-03 DIAGNOSIS — C50911 Malignant neoplasm of unspecified site of right female breast: Secondary | ICD-10-CM

## 2013-01-03 LAB — COMPREHENSIVE METABOLIC PANEL (CC13)
CO2: 29 mEq/L (ref 22–29)
Calcium: 9.1 mg/dL (ref 8.4–10.4)
Chloride: 106 mEq/L (ref 98–107)
Creatinine: 0.7 mg/dL (ref 0.6–1.1)
Glucose: 110 mg/dl — ABNORMAL HIGH (ref 70–99)
Total Bilirubin: 0.59 mg/dL (ref 0.20–1.20)

## 2013-01-03 LAB — CBC WITH DIFFERENTIAL/PLATELET
Basophils Absolute: 0 10*3/uL (ref 0.0–0.1)
Eosinophils Absolute: 0 10*3/uL (ref 0.0–0.5)
HCT: 32.3 % — ABNORMAL LOW (ref 34.8–46.6)
HGB: 11 g/dL — ABNORMAL LOW (ref 11.6–15.9)
LYMPH%: 51.1 % — ABNORMAL HIGH (ref 14.0–49.7)
MONO#: 0.1 10*3/uL (ref 0.1–0.9)
NEUT#: 0.5 10*3/uL — CL (ref 1.5–6.5)
NEUT%: 39.4 % (ref 38.4–76.8)
Platelets: 85 10*3/uL — ABNORMAL LOW (ref 145–400)
WBC: 1.2 10*3/uL — ABNORMAL LOW (ref 3.9–10.3)
lymph#: 0.6 10*3/uL — ABNORMAL LOW (ref 0.9–3.3)

## 2013-01-03 NOTE — Telephone Encounter (Signed)
appts made and printed...td 

## 2013-01-03 NOTE — Progress Notes (Signed)
ID: SRAVYA GRISSOM   DOB: 1946-01-27  MR#: [id number]  CSN#:[id number]  PCP: Janice Moh, MD GYN:  SUClaud Kelp MD OTHER MD:   HISTORY OF PRESENT ILLNESS: [name] noted a mass in her right breast sometime in 2011. 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 More recently her husband twisted her arm to get the mass looked at, and she brought it to Dr. Carolee Powell attention. Diagnostic mammography and right ultrasonography at [facility] 03/02/2012 showed a 6.78 cm irregular mass in the inner aspect of the right breast. There was nipple retraction and erythema around the nipple. The largest lymph node was noted in the right axilla. By ultrasound the large irregular hypoechoic mass was noted in the breast with multiple enlarged axillary lymph nodes. Physical exam confirmed a hard breast with erythema around the right nipple extending inferiorly. The nipple is slightly inverted.  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 Her subsequent history is as detailed below  INTERVAL HISTORY: Janice Powell returns today for follow up of her locally advanced right breast cancer.  She has completed all 6 cycles of her neoadjuvant chemotherapy.  She is tired this past week, but admits that it makes her feel better simply knowing she has completed chemotherapy.   REVIEW OF SYSTEMS: Janice Powell  denies any fevers, chills, or night sweats. She's had no rashes, abnormal bruising, or bleeding. Her appetite is good, and she denies any recent nausea, emesis, or change in bowel or bladder habits. She denies any cough, shortness of breath, chest pain, or palpitations. She's had no abnormal headaches or dizziness. She denies any unusual myalgias, arthralgias, or bony pain. No peripheral swelling, and no evidence of peripheral neuropathy.  A detailed review of systems is otherwise stable and  noncontributory today.   PAST MEDICAL HISTORY: Past Medical History  Diagnosis Date  . Hypertension   . Cancer     right breast  . Pneumonia     hx  . GERD (gastroesophageal reflux disease)     occ  . Osteopenia   GERD osteopenia  PAST SURGICAL HISTORY: Past Surgical History  Procedure Laterality Date  . Appendectomy  1992  . Tubal ligation  1979  . Cholecystectomy  2000  . Portacath placement  08/15/2012    Procedure: INSERTION PORT-A-CATH;  Surgeon: Ernestene Mention, MD;  Location: [facility] OR;  Service: General;  Laterality: Left;    FAMILY HISTORY Family History  Problem Relation Age of Onset  . Heart disease Mother    the patient's father died in his sleep at age 36. The patient's mother died at the age of 74 from a myocardial infarction. The patient has one brother and 2 sisters, all surviving. There is no history of breast or ovarian cancer in the family.  GYNECOLOGIC HISTORY: Menarche age 64, first live birth age 20, she is GX P5, menopause age 37. She did not take hormone replacement.  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 Her husband Janice Powell worked in the post office 41 years, and is also now retired. He is a former Arts development officer. Son Janice Powell lives in [place] and manages an [employer] store. Daughter Janice Powell also lives in [place] and manages the IT Department at [employer]. Son Janice Powell is a Therapist, sports. Daughter Janice Powell is a Doctor, hospital for [employer].  Son Janice Powell is an Teaching laboratory technician. The patient has 8 grandchildren. She attends the white [employer]   ADVANCED DIRECTIVES: Not in place  HEALTH MAINTENANCE: History  Substance Use Topics  . Smoking status: Never Smoker   . Smokeless tobacco: Never Used  . Alcohol Use: No     Colonoscopy:  PAP:  Bone density:  January 2011  Lipid panel:  No Known Allergies  Current Outpatient Prescriptions  Medication Sig Dispense Refill   . dexamethasone (DECADRON) 4 MG tablet Take 2 tablets by mouth twice a day as directed  30 tablet  3  . HYDROcodone-acetaminophen (NORCO/VICODIN) 5-325 MG per tablet Take 1-2 tablets by mouth every 4 (four) hours as needed for pain.  50 tablet  1  . lidocaine-prilocaine (EMLA) cream Apply 1 application topically as needed.       Marland Kitchen lisinopril (PRINIVIL) 10 MG tablet Take 1 tablet (10 mg total) by mouth daily.  30 tablet  12  . loperamide (IMODIUM) 2 MG capsule Take 1 capsule (2 mg total) by mouth 4 (four) times daily as needed for diarrhea or loose stools.  12 capsule  0  . LORazepam (ATIVAN) 0.5 MG tablet Take 1 tablet (0.5 mg total) by mouth at bedtime as needed for anxiety.  30 tablet  0  . metoprolol succinate (TOPROL XL) 50 MG 24 hr tablet Take 1 tablet (50 mg total) by mouth daily. Take with or immediately following a meal.  90 tablet  0  . ondansetron (ZOFRAN) 8 MG tablet Take 1 tablet (8 mg total) by mouth every 12 (twelve) hours as needed for nausea.  30 tablet  1  . potassium chloride SA (K-DUR,KLOR-CON) 20 MEQ tablet Starting 12/07/2012 take 1 tablet qid x 5 days then resume bid dose.  70 tablet  6  . prochlorperazine (COMPAZINE) 10 MG tablet Take 10 mg by mouth every 6 (six) hours as needed. Nausea       No current facility-administered medications for this visit.    OBJECTIVE: Middle-aged [place] American woman in no acute distress Filed Vitals:   01/03/13 1411  BP: 184/78  Pulse: 73  Temp: 98.7 F (37.1 C)  Resp: 20     Body mass index is 23.92 kg/(m^2).    ECOG FS: 1 Filed Weights   01/03/13 1411  Weight: 135 lb (61.236 kg)   Sclerae unicteric Oropharynx clear, no ulcerations No cervical or supraclavicular adenopathy  Lungs clear to auscultation, no wheezes or rhonchi  Heart regular rate and rhythm, soft systolic murmur Abdomen  soft, nontender to palpation, positive bowel sounds MSK no focal spinal tenderness to palpation No peripheral edema Neuro: nonfocal, well  oriented, pleasant affect Skin on the hands bilaterally as well as the nailbeds bilaterally is hyperpigmented, but with no cracking or peeling Breasts:  Deferred. Port is intact the left upper chest wall with no erythema or edema.   LAB RESULTS: CBC    Component Value Date/Time   WBC 1.2* 01/03/2013 1347   WBC 5.1 09/01/2012 0319   RBC 3.35* 01/03/2013 1347   RBC 4.19 09/01/2012 0319   HGB 11.0* 01/03/2013 1347   HGB 12.9 09/01/2012 0502   HCT 32.3* 01/03/2013 1347   HCT 38.0 09/01/2012 0502   PLT 85* 01/03/2013 1347   PLT 164 09/01/2012 0319   MCV 96.6 01/03/2013 1347   MCV 91.9 09/01/2012 0319   MCH 32.9 01/03/2013 1347   MCH 30.8 09/01/2012 0319   MCHC 34.1 01/03/2013 1347   MCHC 33.5  09/01/2012 0319   RDW 15.9* 01/03/2013 1347   RDW 13.6 09/01/2012 0319   LYMPHSABS 0.6* 01/03/2013 1347   LYMPHSABS 0.9 09/01/2012 0319   MONOABS 0.1 01/03/2013 1347   MONOABS 0.7 09/01/2012 0319   EOSABS 0.0 01/03/2013 1347   EOSABS 0.1 09/01/2012 0319   BASOSABS 0.0 01/03/2013 1347   BASOSABS 0.1 09/01/2012 0319    CMP     Component Value Date/Time   NA 144 01/03/2013 1347   NA 139 09/01/2012 0502   K 3.2* 01/03/2013 1347   K 3.3* 09/01/2012 0502   CL 106 01/03/2013 1347   CL 102 09/01/2012 0502   CO2 29 01/03/2013 1347   CO2 25 09/01/2012 0319   GLUCOSE 110* 01/03/2013 1347   GLUCOSE 113* 09/01/2012 0502   BUN 13.0 01/03/2013 1347   BUN 8 09/01/2012 0502   CREATININE 0.7 01/03/2013 1347   CREATININE 0.80 09/01/2012 0502   CALCIUM 9.1 01/03/2013 1347   CALCIUM 8.6 09/01/2012 0319   PROT 6.5 01/03/2013 1347   PROT 6.9 09/01/2012 0319   ALBUMIN 3.6 01/03/2013 1347   ALBUMIN 3.1* 09/01/2012 0319   AST 10 01/03/2013 1347   AST 21 09/01/2012 0319   ALT 9 01/03/2013 1347   ALT 14 09/01/2012 0319   ALKPHOS 77 01/03/2013 1347   ALKPHOS 47 09/01/2012 0319   BILITOT 0.59 01/03/2013 1347   BILITOT 0.5 09/01/2012 0319   GFRNONAA >90 09/01/2012 0319   GFRAA >90 09/01/2012 0319       STUDIES:  Echocardiogram on 08/19/2012 showed an ejection fraction  of 60-65%.   ASSESSMENT: 67 y.o. [place] woman   (1)  s/p Right breast and axillary node biopsy 03/08/2012 for a clinical T4, N1-2', stage Powell invasive ductal carcinoma, grade 3, triple negative, with an MIB-1 of 93%.  (2)  being treated in the neoadjuvant setting, the original goal being to complete 6 cycles of docetaxel/doxorubicin/cyclophosphamide given every 3 weeks, with patient initially declining Neulasta injections after cycle 1.  Docetaxel was dropped beginning cycle 3 due to poor tolerance, but continuing with doxorubicin/cyclophosphamide every 3 weeks. Neoadjuvant chemotherapy completed 12/27/2012  (3) hypertension, poorly controlled  PLAN: Renatta is very please to have completed all 6 cycles of her neoadjuvant chemotherapy. We again reviewed neutropenic precautions, and she is starting on Cipro prophylactically, 500 mg by mouth twice a day for the next 7 days. As always, she knows to contact us with any fevers of 100 or above. We are awaiting her potassium results today, and we'll reevaluate her potassium dosing (which she admits she has not been taking this past week). We'll repeat both a metabolic panel and a CBC in 1 week to make sure these counts are all normal I think.  Otherwise, the patient is already scheduled for breast MRI on June 26 and will see Dr. Derrell Lolling on June 27 to discuss definitive surgery. He may remove her port during her surgery. She'll return to see Dr. Darnelle Catalan in late July to review pathology results and discuss her long-term followup plan.    She voices understanding and agreement with this plan, and will call with any changes or problems prior to her next scheduled appointment.   Kaitlynne Wenz    01/03/2013

## 2013-01-11 ENCOUNTER — Other Ambulatory Visit (HOSPITAL_BASED_OUTPATIENT_CLINIC_OR_DEPARTMENT_OTHER): Payer: Medicare Other | Admitting: Lab

## 2013-01-11 ENCOUNTER — Telehealth: Payer: Self-pay

## 2013-01-11 ENCOUNTER — Telehealth: Payer: Self-pay | Admitting: Oncology

## 2013-01-11 ENCOUNTER — Other Ambulatory Visit: Payer: Self-pay | Admitting: Physician Assistant

## 2013-01-11 DIAGNOSIS — E876 Hypokalemia: Secondary | ICD-10-CM

## 2013-01-11 DIAGNOSIS — C50319 Malignant neoplasm of lower-inner quadrant of unspecified female breast: Secondary | ICD-10-CM

## 2013-01-11 DIAGNOSIS — C50911 Malignant neoplasm of unspecified site of right female breast: Secondary | ICD-10-CM

## 2013-01-11 DIAGNOSIS — D702 Other drug-induced agranulocytosis: Secondary | ICD-10-CM

## 2013-01-11 LAB — COMPREHENSIVE METABOLIC PANEL (CC13)
AST: 13 U/L (ref 5–34)
Alkaline Phosphatase: 94 U/L (ref 40–150)
Glucose: 123 mg/dl — ABNORMAL HIGH (ref 70–99)
Sodium: 144 mEq/L (ref 136–145)
Total Bilirubin: 0.27 mg/dL (ref 0.20–1.20)
Total Protein: 6.5 g/dL (ref 6.4–8.3)

## 2013-01-11 LAB — CBC WITH DIFFERENTIAL/PLATELET
BASO%: 0.4 % (ref 0.0–2.0)
Eosinophils Absolute: 0 10*3/uL (ref 0.0–0.5)
HCT: 34.1 % — ABNORMAL LOW (ref 34.8–46.6)
LYMPH%: 12.7 % — ABNORMAL LOW (ref 14.0–49.7)
MCHC: 32.6 g/dL (ref 31.5–36.0)
MONO#: 1 10*3/uL — ABNORMAL HIGH (ref 0.1–0.9)
NEUT#: 6.4 10*3/uL (ref 1.5–6.5)
NEUT%: 74.6 % (ref 38.4–76.8)
Platelets: 183 10*3/uL (ref 145–400)
RBC: 3.47 10*6/uL — ABNORMAL LOW (ref 3.70–5.45)
WBC: 8.5 10*3/uL (ref 3.9–10.3)
lymph#: 1.1 10*3/uL (ref 0.9–3.3)
nRBC: 1 % — ABNORMAL HIGH (ref 0–0)

## 2013-01-11 NOTE — Telephone Encounter (Signed)
s.w. pt and advised on lab b4 MRI on 6.26.14.Marland KitchenMarland Kitchenpt ok and aware

## 2013-01-11 NOTE — Telephone Encounter (Signed)
Called pt to inform her per Zollie Scale, PA- pt should increase potassium to 20 meq 4 times daily.  Pt states she is currently taking it 2 times daily.  Informed pt that schedulers will be calling her with appt to re-check level next week, per Amy.  Pt verbalizes understanding.

## 2013-01-19 ENCOUNTER — Other Ambulatory Visit (HOSPITAL_BASED_OUTPATIENT_CLINIC_OR_DEPARTMENT_OTHER): Payer: Medicare Other | Admitting: Lab

## 2013-01-19 ENCOUNTER — Ambulatory Visit (HOSPITAL_COMMUNITY)
Admission: RE | Admit: 2013-01-19 | Discharge: 2013-01-19 | Disposition: A | Discharge status: Home / Self Care | Payer: Medicare Other | Source: Ambulatory Visit | Attending: Physician Assistant | Admitting: Physician Assistant

## 2013-01-19 DIAGNOSIS — C50919 Malignant neoplasm of unspecified site of unspecified female breast: Secondary | ICD-10-CM

## 2013-01-19 DIAGNOSIS — C50911 Malignant neoplasm of unspecified site of right female breast: Secondary | ICD-10-CM

## 2013-01-19 DIAGNOSIS — E876 Hypokalemia: Secondary | ICD-10-CM | POA: Diagnosis not present

## 2013-01-19 DIAGNOSIS — C773 Secondary and unspecified malignant neoplasm of axilla and upper limb lymph nodes: Secondary | ICD-10-CM | POA: Diagnosis not present

## 2013-01-19 DIAGNOSIS — Z9221 Personal history of antineoplastic chemotherapy: Secondary | ICD-10-CM | POA: Diagnosis not present

## 2013-01-19 LAB — CBC WITH DIFFERENTIAL/PLATELET
Basophils Absolute: 0 10*3/uL (ref 0.0–0.1)
Eosinophils Absolute: 0 10*3/uL (ref 0.0–0.5)
LYMPH%: 30.6 % (ref 14.0–49.7)
MCH: 32.4 pg (ref 25.1–34.0)
MCV: 99.4 fL (ref 79.5–101.0)
MONO%: 16.9 % — ABNORMAL HIGH (ref 0.0–14.0)
NEUT#: 4 10*3/uL (ref 1.5–6.5)
Platelets: 259 10*3/uL (ref 145–400)
RBC: 3.43 10*6/uL — ABNORMAL LOW (ref 3.70–5.45)

## 2013-01-19 LAB — COMPREHENSIVE METABOLIC PANEL (CC13)
Alkaline Phosphatase: 72 U/L (ref 40–150)
BUN: 10.9 mg/dL (ref 7.0–26.0)
Glucose: 89 mg/dl (ref 70–140)
Sodium: 143 mEq/L (ref 136–145)
Total Bilirubin: 0.24 mg/dL (ref 0.20–1.20)

## 2013-01-19 MED ORDER — GADOBENATE DIMEGLUMINE 529 MG/ML IV SOLN
15.0000 mL | Freq: Once | INTRAVENOUS | Status: AC | PRN
  Administered 2013-01-19: 12 mL via INTRAVENOUS

## 2013-01-20 ENCOUNTER — Ambulatory Visit (INDEPENDENT_AMBULATORY_CARE_PROVIDER_SITE_OTHER): Payer: Medicare Other | Admitting: General Surgery

## 2013-01-20 ENCOUNTER — Encounter (INDEPENDENT_AMBULATORY_CARE_PROVIDER_SITE_OTHER): Payer: Self-pay | Admitting: General Surgery

## 2013-01-20 VITALS — BP 142/90 | HR 68 | Temp 99.1°F | Resp 16 | Ht 63.5 in | Wt 138.2 lb

## 2013-01-20 DIAGNOSIS — C50919 Malignant neoplasm of unspecified site of unspecified female breast: Secondary | ICD-10-CM | POA: Diagnosis not present

## 2013-01-20 DIAGNOSIS — C50911 Malignant neoplasm of unspecified site of right female breast: Secondary | ICD-10-CM

## 2013-01-20 NOTE — Progress Notes (Signed)
Patient ID: Janice Powell, female   DOB: [birth year], 67 y.o.   MRN: [id number]  Chief Complaint  Patient presents with  . Follow-up    f/u br ca after chemo    HPI Janice Powell is a 67 y.o. female.  She returns following completion of neoadjuvant chemotherapy for discussion of definitive surgery for her locally advanced right breast cancer.  She thinks that the right breast tumor is  smaller. The nipple and areola, of course, are still completely destroyed by cancer. This patient noticed a right breast mass in 2011. 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 More recently, she brought it to Dr. Carolee Rota attention. Diagnostic mammography and right ultrasonography at [facility] 03/02/2012 showed a 6.7 cm irregular mass in the inner aspect of the right breast. There was nipple retraction and erythema around the nipple. A large lymph node was noted in the right axilla. By ultrasound the large irregular hypoechoic mass was noted in the breast with multiple enlarged axillary lymph nodes. Physical exam confirmed a hard breast with erythema around the right nipple extending inferiorly. The nipple is slightly inverted. Palpable axillary nodes were noted.  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 Her subsequent history is detailed below  She had previously been evaluated by Dr. Chevis Pretty on Aug. 30, 2013 and had declined any surgical intervention or other treatment. .  She initially declined chemotherapy, but reconsidered and I placed a Port-A-Cath on 08/15/2012.  She has been receiving neoadjuvant chemotherapy. She has now had 5/6 cycles of a planned 6 cycles. The final cycle was completed June 3.They think that she is responding..  Mid treatment MRI on April 3 shows a 7.5 cm tumor in the right, involving the skin was mildly enlarged right axillary lymph nodes felt to be  positive. MRI performed on 01/19/2013 shows good response in the right breast with minimal enhancement. The level I lymph nodes are much smaller and are basically normal. Otherwise there are no abnormal findings on MRI.  She was recently evaluated in the cancer center. They have stated that it is okay to remove her Port-A-Cath and proceed with definitive surgery.  HPI  Past Medical History  Diagnosis Date  . Hypertension   . Cancer     right breast  . Pneumonia     hx  . GERD (gastroesophageal reflux disease)     occ  . Osteopenia     Past Surgical History  Procedure Laterality Date  . Appendectomy  1992  . Tubal ligation  1979  . Cholecystectomy  2000  . Portacath placement  08/15/2012    Procedure: INSERTION PORT-A-CATH;  Surgeon: Ernestene Mention, MD;  Location[facility] OR;  Service: General;  Laterality: Left;    Family History  Problem Relation Age of Onset  . Heart disease Mother     Social History History  Substance Use Topics  . Smoking status: Never Smoker   . Smokeless tobacco: Never Used  . Alcohol Use: No    No Known Allergies  Current Outpatient Prescriptions  Medication Sig Dispense Refill  . lisinopril (PRINIVIL) 10 MG tablet Take 1 tablet (10 mg total) by mouth daily.  30 tablet  12  . metoprolol succinate (TOPROL XL) 50 MG 24 hr tablet Take 1 tablet (50 mg total) by mouth daily. Take with or immediately following a meal.  90 tablet  0  . naproxen sodium (ANAPROX) 220 MG tablet Take 220 mg by mouth 2 (two) times daily with a meal.      . potassium chloride SA (K-DUR,KLOR-CON) 20 MEQ tablet Starting 12/07/2012 take 1 tablet qid x 5 days then resume bid dose.  70 tablet  6  . lidocaine-prilocaine (EMLA) cream Apply 1 application topically as needed.       . loperamide (IMODIUM) 2 MG capsule Take 1 capsule (2 mg total) by mouth 4 (four) times daily as needed for diarrhea or loose stools.  12 capsule  0  . LORazepam (ATIVAN) 0.5 MG tablet Take 1 tablet (0.5 mg  total) by mouth at bedtime as needed for anxiety.  30 tablet  0  . ondansetron (ZOFRAN) 8 MG tablet Take 1 tablet (8 mg total) by mouth every 12 (twelve) hours as needed for nausea.  30 tablet  1  . prochlorperazine (COMPAZINE) 10 MG tablet Take 10 mg by mouth every 6 (six) hours as needed. Nausea       No current facility-administered medications for this visit.    Review of Systems Review of Systems  Constitutional: Negative for fever, chills and unexpected weight change.  HENT: Negative for hearing loss, congestion, sore throat, trouble swallowing and voice change.   Eyes: Negative for visual disturbance.  Respiratory: Negative for cough and wheezing.   Cardiovascular: Negative for chest pain, palpitations and leg swelling.  Gastrointestinal: Negative for nausea, vomiting, abdominal pain, diarrhea, constipation, blood in stool, abdominal distention and anal bleeding.  Genitourinary: Negative for hematuria, vaginal bleeding and difficulty urinating.  Musculoskeletal: Negative for arthralgias.  Skin: Negative for rash and wound.  Neurological: Negative for seizures, syncope and headaches.  Hematological: Negative for adenopathy. Does not bruise/bleed easily.  Psychiatric/Behavioral: Negative for confusion.    Blood pressure 142/90, pulse 68, temperature 99.1 F (37.3 C), temperature source Temporal, resp. rate 16, height 5' 3.5" (1.613 m), weight 138 lb 3.2 oz (62.687 kg).  Physical Exam Physical Exam  Constitutional: She is oriented to person, place, and time. She appears well-developed and well-nourished. No distress.  HENT:  Head: Normocephalic and atraumatic.  Nose: Nose normal.  Mouth/Throat: No oropharyngeal exudate.  Eyes: Conjunctivae and EOM are normal. Pupils are equal, round, and reactive to light. Left eye exhibits no discharge. No scleral icterus.  Neck: Neck supple. No JVD present. No tracheal deviation present. No thyromegaly present.  Cardiovascular: Normal rate,  regular rhythm, normal heart sounds and intact distal pulses.   No murmur heard. Pulmonary/Chest: Effort normal and breath sounds normal. No respiratory distress. She has no wheezes. She has no rales. She exhibits no tenderness.  Port-A-Cath site left infraclavicular area looks good. Right breast is small. The nipple and areola are completely destroyed and there is a chronic dry eschar replacing this. There is some splotchy petechiae throughout the breast but the breast is quite soft and I do not feel a tumor mass anymore. I think that I can perform a mastectomy with a primary closure. There are no palpable lymph nodes in the right axilla.  Abdominal: Soft. Bowel sounds are normal. She exhibits no distension and no mass. There is no tenderness. There is no rebound and no guarding.  Musculoskeletal: She exhibits no edema and no tenderness.  Lymphadenopathy:    She has no cervical adenopathy.  Neurological: She is alert and oriented to person, place, and time. She exhibits normal muscle tone. Coordination normal.  Skin: Skin is warm. No rash noted.  She is not diaphoretic. No erythema. No pallor.  Psychiatric: She has a normal mood and affect. Her behavior is normal. Judgment and thought content normal.    Data Reviewed Recent cancer Center notes. Recent breast MRI  Assessment    Locally advanced cancer right breast, initial clinical stage T4,N2, stage III, TNBC  Status post Port-A-Cath insertion without complications  Good radiographic and clinical response to neoadjuvant chemotherapy. I think we can do a mastectomy with primary skin closure  Hypertension  GERD     Plan    Schedule for a right modified radical mastectomy and Port-A-Cath removal.  I discussed the indications, details, techniques, numerous risks of the surgery with her. She is aware that she will have drains. She is aware of the problems with bleeding, infection, skin necrosis, possible positive margins, arm swelling, or  numbness or pain and other unforseen problems. She's been given written information about all these issues. She understands all these issues all her questions were answered. She is in agreement with this plan.        Angelia Mould. Derrell Lolling, M.D., [facility] General and Minimally invasive Surgery Breast and Colorectal Surgery Office:   [phone] Pager:   [phone]  01/20/2013, 3:52 PM

## 2013-01-20 NOTE — Patient Instructions (Addendum)
The tumor in the right breast is much smaller. The lymph nodes in the right axilla are smaller. I think that we can do a mastectomy and get the skin closed now. We can also remove the Port-A-Cath at the same time.  You will be scheduled for a right modified radical mastectomy and removal of the Port-A-Cath in the near future.     Mastectomy, With or Without Reconstruction Mastectomy (removal of the breast) is a procedure most commonly used to treat cancer (tumor) of the breast. Different procedures are available for treatment. This depends on the stage of the tumor (abnormal growths). Discuss this with your caregiver, surgeon (a specialist for performing operations such as this), or oncologist (someone specialized in the treatment of cancer). With proper information, you can decide which treatment is best for you. Although the sound of the word cancer is frightening to all of [place], the new treatments and medications can be a source of reassurance and comfort. If there are things you are worried about, discuss them with your caregiver. He or she can help comfort you and your family. Some of the different procedures for treating breast cancer are:  Radical (extensive) mastectomy. This is an operation used to remove the entire breast, the muscles under the breast, and all of the glands (lymph nodes) under the arm. With all of the new treatments available for cancer of the breast, this procedure has become less common.  Modified radical mastectomy. This is a similar operation to the radical mastectomy described above. In the modified radical mastectomy, the muscles of the chest wall are not removed unless one of the lessor muscles is removed. One of the lessor muscles may be removed to allow better removal of the lymph nodes. The axillary lymph nodes are also removed. Rarely, during an axillary node dissection nerves to this area are damaged. Radiation therapy is then often used to the area following this  surgery.  A total mastectomy also known as a complete or simple mastectomy. It involves removal of only the breast. The lymph nodes and the muscles are left in place.  In a lumpectomy, the lump is removed from the breast. This is the simplest form of surgical treatment. A sentinel lymph node biopsy may also be done. Additional treatment may be required. RISKS AND COMPLICATIONS The main problems that follow removal of the breast include:  Infection (germs start growing in the wound). This can usually be treated with antibiotics (medications that kill germs).  Lymphedema. This means the arm on the side of the breast that was operated on swells because the lymph (tissue fluid) cannot follow the main channels back into the body. This only occurs when the lymph nodes have had to be removed under the arm.  There may be some areas of numbness to the upper arm and around the incision (cut by the surgeon) in the breast. This happens because of the cutting of or damage to some of the nerves in the area. This is most often unavoidable.  There may be difficulty moving the arm in a full range of motion (moving in all directions) following surgery. This usually improves with time following use and exercise.  Recurrence of breast cancer may happen with the very best of surgery and follow up treatment. Sometimes small cancer cells that cannot be seen with the naked eye have already spread at the time of surgery. When this happens other treatment is available. This treatment may be radiation, medications or a combination of both.  RECONSTRUCTION Reconstruction of the breast may be done immediately if there is not going to be post-operative radiation. This surgery is done for cosmetic (improve appearance) purposes to improve the physical appearance after the operation. This may be done in two ways:  It can be done using a saline filled prosthetic (an artificial breast which is filled with salt water). Silicone  breast implants are now re-approved by the FDA and are being commonly used.  Reconstruction can be done using the body's own muscle/fat/skin. Your caregiver will discuss your options with you. Depending upon your needs or choice, together you will be able to determine which procedure is best for you. Document Released: 04/07/2001 Document Revised: 04/06/2012 Document Reviewed: 11/29/2007 [facility] Patient Information 2014 [place], [place].   Mastectomy Care After HOME CARE    Care for your wound after the bandages are off as told by your doctor.  Put soft padding such as gauze, soft cloth, or a nursing pad over your wound if you wear a bra.  Ask your doctor about groups that can help you with any emotions you may have after the surgery.  Exercise your arm and shoulder as told by your doctor.  Place your hands on a wall. Use your fingers to "climb the wall." Reach as high as you can until you feel a stretch. When you are not exercising, keep your arm raised (elevated).  When sitting or lying down, put your arm up on pillows or rolled blankets.  Do not use your arm to lift or push anything heavier than 10 pounds (about one gallon of milk ) for the first 6 weeks.  Always take good care of the arm on the side that the breast was removed.  Never let anyone take your blood pressure, draw blood, or give you a shot in that arm.  Do not get even a small cut on that arm or hand. Use a thimble when you sew. Wear heavy gloves when you garden.  Use insect repellent on that arm if outside.  Do not use a razor to shave that underarm. You should use only an electric shaver.  Do not burn that arm. Use a glove when you reach into the oven. Cover your arm with a towel or wear a long-sleeved shirt when you are out in the sun.  Wear your watch and other jewelry on the other arm.  Wear a loose fitting rubber glove when you wash the dishes. Do not leave your hand in water for a long time, especially  when you use detergents.  Do not cut your cuticles or hang nails. Push cuticles back with a towel after you take a bath.  Carry your purse or any heavy objects in the other arm. GET HELP RIGHT AWAY IF:   Your arm becomes very puffy (swollen).  You have redness or pain at the wound site.  There is a bad smell coming from the wound.  Thre is yellowish white fluid (pus) coming from your wound.  You have a fever. MAKE SURE YOU:   Understand these instructions.  Will watch your condition.  Will get help right away if you are not doing well or get worse. Document Released: 04/21/2008 Document Revised: 10/05/2011 Document Reviewed: 04/21/2008 [facility] Patient Information 2014 [place], [place].

## 2013-01-24 ENCOUNTER — Other Ambulatory Visit: Payer: Medicare Other | Admitting: Lab

## 2013-01-24 ENCOUNTER — Ambulatory Visit: Payer: Medicare Other | Admitting: Oncology

## 2013-02-21 ENCOUNTER — Other Ambulatory Visit (HOSPITAL_BASED_OUTPATIENT_CLINIC_OR_DEPARTMENT_OTHER): Payer: Medicare Other | Admitting: Lab

## 2013-02-21 ENCOUNTER — Ambulatory Visit (HOSPITAL_BASED_OUTPATIENT_CLINIC_OR_DEPARTMENT_OTHER): Payer: Medicare Other | Admitting: Oncology

## 2013-02-21 VITALS — BP 213/80 | HR 60 | Temp 98.4°F | Resp 20 | Ht 63.5 in | Wt 140.0 lb

## 2013-02-21 DIAGNOSIS — C50919 Malignant neoplasm of unspecified site of unspecified female breast: Secondary | ICD-10-CM

## 2013-02-21 LAB — CBC WITH DIFFERENTIAL/PLATELET
Basophils Absolute: 0 10*3/uL (ref 0.0–0.1)
EOS%: 2.5 % (ref 0.0–7.0)
HCT: 37.6 % (ref 34.8–46.6)
HGB: 12.5 g/dL (ref 11.6–15.9)
MCH: 33 pg (ref 25.1–34.0)
MCV: 99.3 fL (ref 79.5–101.0)
NEUT%: 65.6 % (ref 38.4–76.8)
Platelets: 222 10*3/uL (ref 145–400)
lymph#: 1.3 10*3/uL (ref 0.9–3.3)

## 2013-02-21 NOTE — Addendum Note (Signed)
Addended by: Laroy Apple E on: 02/21/2013 03:51 PM   Modules accepted: Orders

## 2013-02-21 NOTE — Progress Notes (Signed)
ID: Janice Powell   DOB: 09/16/1945  MR#: [id number]  CSN#:[id number]  PCP: Londell Moh, MD GYN:  SUClaud Kelp MD OTHER MD:   HISTORY OF PRESENT ILLNESS: Janice Powell noted a mass in her right breast sometime in 2011. 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 More recently her husband twisted her arm to get the mass looked at, and she brought it to Dr. Carolee Rota attention. Diagnostic mammography and right ultrasonography at [facility] 03/02/2012 showed a 6.78 cm irregular mass in the inner aspect of the right breast. There was nipple retraction and erythema around the nipple. The largest lymph node was noted in the right axilla. By ultrasound the large irregular hypoechoic mass was noted in the breast with multiple enlarged axillary lymph nodes. Physical exam confirmed a hard breast with erythema around the right nipple extending inferiorly. The nipple is slightly inverted.  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 Her subsequent history is as detailed below  INTERVAL HISTORY: Janice Powell returns today for follow up of her breast cancer. Since her last visit here she had her restaging MRI, which is showing a complete radiologic response. She is scheduled for surgery August 18.   REVIEW OF SYSTEMS: Janice Powell  has recovered from her chemotherapy associated symptoms. However she is having a lot of stress now 4 marital reasons. She tells me this is the reason her blood pressure is so high today. We talked about strategies to bring distress down, and in particular she does have Ativan available so if she really is upset she can always take one half of a tablet. Otherwise a detailed review of systems today was entirely negative   PAST MEDICAL HISTORY: Past Medical History  Diagnosis Date  . Hypertension   . Cancer     right breast  . Pneumonia     hx  . GERD  (gastroesophageal reflux disease)     occ  . Osteopenia   GERD osteopenia  PAST SURGICAL HISTORY: Past Surgical History  Procedure Laterality Date  . Appendectomy  1992  . Tubal ligation  1979  . Cholecystectomy  2000  . Portacath placement  08/15/2012    Procedure: INSERTION PORT-A-CATH;  Surgeon: Ernestene Mention, MD;  Location: [facility] OR;  Service: General;  Laterality: Left;    FAMILY HISTORY Family History  Problem Relation Age of Onset  . Heart disease Mother    the patient's father died in his sleep at age 59. The patient's mother died at the age of 22 from a myocardial infarction. The patient has one brother and 2 sisters, all surviving. There is no history of breast or ovarian cancer in the family.  GYNECOLOGIC HISTORY: Menarche age 56, first live birth age 72, she is GX P5, menopause age 54. She did not take hormone replacement.  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 Her husband [name] worked in the post office 41 years, and is also now retired. He is a former Arts development officer. Son [name] lives in [place] and manages an [employer] store. Daughter Yolani Vo also lives in [place] and manages the IT Department at [employer]. Son Latori Beggs III is a Therapist, sports. Daughter [name] is a Doctor, hospital for [employer]. Son [name] is an Teaching laboratory technician. The patient has 8 grandchildren. She attends the white [employer]  ADVANCED DIRECTIVES: Not in place  HEALTH MAINTENANCE: History  Substance Use Topics  . Smoking status: Never Smoker   . Smokeless tobacco: Never Used  . Alcohol Use: No     Colonoscopy:  PAP:  Bone density:  January 2011  Lipid panel:  No Known Allergies  Current Outpatient Prescriptions  Medication Sig Dispense Refill  . lidocaine-prilocaine (EMLA) cream Apply 1 application topically as needed.       Marland Kitchen lisinopril (PRINIVIL) 10 MG tablet Take 1 tablet (10 mg total) by mouth daily.   30 tablet  12  . loperamide (IMODIUM) 2 MG capsule Take 1 capsule (2 mg total) by mouth 4 (four) times daily as needed for diarrhea or loose stools.  12 capsule  0  . LORazepam (ATIVAN) 0.5 MG tablet Take 1 tablet (0.5 mg total) by mouth at bedtime as needed for anxiety.  30 tablet  0  . metoprolol succinate (TOPROL XL) 50 MG 24 hr tablet Take 1 tablet (50 mg total) by mouth daily. Take with or immediately following a meal.  90 tablet  0  . naproxen sodium (ANAPROX) 220 MG tablet Take 220 mg by mouth 2 (two) times daily with a meal.      . ondansetron (ZOFRAN) 8 MG tablet Take 1 tablet (8 mg total) by mouth every 12 (twelve) hours as needed for nausea.  30 tablet  1  . potassium chloride SA (K-DUR,KLOR-CON) 20 MEQ tablet Starting 12/07/2012 take 1 tablet qid x 5 days then resume bid dose.  70 tablet  6  . prochlorperazine (COMPAZINE) 10 MG tablet Take 10 mg by mouth every 6 (six) hours as needed. Nausea       No current facility-administered medications for this visit.    OBJECTIVE: Middle-aged [place] American woman who appears well Filed Vitals:   02/21/13 1025  BP: 213/80  Pulse: 60  Temp: 98.4 F (36.9 C)  Resp: 20     Body mass index is 24.41 kg/(m^2).    ECOG FS: 0  Filed Weights   02/21/13 1025  Weight: 140 lb (63.504 kg)   Repeat blood pressure 180/78 Sclerae unicteric Oropharynx clear, no ulcerations No cervical or supraclavicular adenopathy  Lungs clear to auscultation, no wheezes or rhonchi  Heart regular rate and rhythm, soft systolic murmur Abdomen  soft, nontender to palpation, positive bowel sounds MSK no focal spinal tenderness to palpation No peripheral edema Neuro: nonfocal, well oriented, pleasant affect Breasts:  The right breast shows a cross over the nipple. There are no palpable masses. The right axilla is benign. The left breast is unremarkable. Port is intact the left upper chest wall with no erythema or edema.   LAB RESULTS: CBC    Component Value  Date/Time   WBC 6.6 02/21/2013 0952   WBC 5.1 09/01/2012 0319   RBC 3.79 02/21/2013 0952   RBC 4.19 09/01/2012 0319   HGB 12.5 02/21/2013 0952   HGB 12.9 09/01/2012 0502   HCT 37.6 02/21/2013 0952   HCT 38.0 09/01/2012 0502   PLT 222 02/21/2013 0952   PLT 164 09/01/2012 0319   MCV 99.3 02/21/2013 0952   MCV 91.9 09/01/2012 0319   MCH 33.0 02/21/2013 0952   MCH 30.8 09/01/2012 0319   MCHC 33.2 02/21/2013 0952   MCHC 33.5 09/01/2012 0319   RDW 14.9* 02/21/2013 0952   RDW 13.6 09/01/2012 0319   LYMPHSABS 1.3 02/21/2013 0952   LYMPHSABS 0.9 09/01/2012 0319   MONOABS 0.8 02/21/2013 0952   MONOABS 0.7  09/01/2012 0319   EOSABS 0.2 02/21/2013 0952   EOSABS 0.1 09/01/2012 0319   BASOSABS 0.0 02/21/2013 0952   BASOSABS 0.1 09/01/2012 0319    CMP     Component Value Date/Time   NA 143 01/19/2013 1154   NA 139 09/01/2012 0502   K 3.6 01/19/2013 1154   K 3.3* 09/01/2012 0502   CL 107 01/11/2013 0853   CL 102 09/01/2012 0502   CO2 26 01/19/2013 1154   CO2 25 09/01/2012 0319   GLUCOSE 89 01/19/2013 1154   GLUCOSE 123* 01/11/2013 0853   GLUCOSE 113* 09/01/2012 0502   BUN 10.9 01/19/2013 1154   BUN 8 09/01/2012 0502   CREATININE 0.7 01/19/2013 1154   CREATININE 0.80 09/01/2012 0502   CALCIUM 8.8 01/19/2013 1154   CALCIUM 8.6 09/01/2012 0319   PROT 6.3* 01/19/2013 1154   PROT 6.9 09/01/2012 0319   ALBUMIN 3.5 01/19/2013 1154   ALBUMIN 3.1* 09/01/2012 0319   AST 20 01/19/2013 1154   AST 21 09/01/2012 0319   ALT 22 01/19/2013 1154   ALT 14 09/01/2012 0319   ALKPHOS 72 01/19/2013 1154   ALKPHOS 47 09/01/2012 0319   BILITOT 0.24 01/19/2013 1154   BILITOT 0.5 09/01/2012 0319   GFRNONAA >90 09/01/2012 0319   GFRAA >90 09/01/2012 0319       STUDIES: *RADIOLOGY REPORT*  Clinical Data: 67 year old female with known right breast cancer -  evaluate response to neoadjuvant chemotherapy.  BILATERAL BREAST MRI WITH AND WITHOUT CONTRAST  THREE-DIMENSIONAL MR IMAGE RENDERING ON INDEPENDENT WORKSTATION:  Technique: Multiplanar, multisequence MR images of both  breasts  were obtained prior to and following the intravenous administration  of 12ml of Multihance. Three-dimensional MR images were rendered  by post-processing of the original MR data on an independent  workstation. The three-dimensional MR images were interpreted, and  findings were reported in the accompanying complete MRI report for  this study.  Comparison: 03/02/2012 mammograms from [place]. 10/24/2012 breast  MRI.  Findings:  AMOUNT OF FIBROGLANDULAR TISSUE: Heterogeneous fibroglandular  tissue  BACKGROUND PARENCHYMAL ENHANCEMENT: Mild  RIGHT BREAST: No suspicious areas of enhancement are now  identified. There is minimal non suspicious enhancement within  residual soft tissue in the area of the known breast neoplasm, in  the central and lower right breast. Right breast skin thickening is  again noted, but now without enhancement.  LEFT BREAST: No abnormal areas of enhancement are identified.  LYMPH NODES: The enlarged level I right axillary lymph nodes have  decreased in size, now normal in appearance. No enlarged or  abnormal-appearing lymph nodes are now identified bilaterally.  ANCILLARY FINDINGS: Cardiomegaly is present.  IMPRESSION:  Near-complete treatment response of right breast neoplasm and right  axillary lymph node metastases. No evidence of new or progressive  disease.  BI-RADS CATEGORY 6: Known biopsy-proven malignancy - appropriate  action should be taken.  RECOMMENDATION:  Continued treatment plan.  Original Report Authenticated By: Harmon Pier, M.D.     ASSESSMENT: 67 y.o. [place] woman   (1)  s/p Right breast and axillary node biopsy 03/08/2012 for a clinical T4, N1-2', stage III invasive ductal carcinoma, grade 3, triple negative, with an MIB-1 of 93%.  (2)  being treated in the neoadjuvant setting, the original goal being to complete 6 cycles of docetaxel/doxorubicin/cyclophosphamide given every 3 weeks, with patient initially declining Neulasta  injections after cycle 1.  Docetaxel was dropped beginning cycle 3 due to poor tolerance, but continuing with doxorubicin/cyclophosphamide every 3 weeks. Neoadjuvant chemotherapy completed  12/27/2012  (3) hypertension, poorly controlled  PLAN: Janice Powell is scheduled for surgery mid-August. She had a very good radiologic response but she understands we do have to wait and the pathology response. Nevertheless no further chemotherapy is planned, and I am comfortable with her port being removed at the same time as her definitive surgery. She will need postop radiation and I am making her an appointment with Dr. Basilio Cairo for September. She will also see me the first week in September to review her pathology reports.  She knows to call for any problems that may develop before the next visit.   MAGRINAT,GUSTAV C    02/21/2013

## 2013-02-24 ENCOUNTER — Encounter (HOSPITAL_COMMUNITY): Payer: Self-pay | Admitting: Pharmacy Technician

## 2013-03-01 ENCOUNTER — Telehealth: Payer: Self-pay | Admitting: *Deleted

## 2013-03-01 ENCOUNTER — Telehealth (INDEPENDENT_AMBULATORY_CARE_PROVIDER_SITE_OTHER): Payer: Self-pay

## 2013-03-01 ENCOUNTER — Other Ambulatory Visit: Payer: Self-pay

## 2013-03-01 NOTE — Telephone Encounter (Signed)
Pt moved her surgery (mastectomy) up to 03/13/13.  Her post op is scheduled for 03/23/13.  She wants to make sure that it is ok to wait 10 days to be seen.  I told her I thought that would be okay, but would pass the message on to Dr. Jacinto Halim nurse.

## 2013-03-01 NOTE — Telephone Encounter (Signed)
sw pt gv appt d/t for 03/31/13 @ 8:30am for labs and ovv@ 9am. Made the pt aware that im waiting for [facility] to enter a referral to see Dr. Basilio Cairo in mid Sept...td

## 2013-03-01 NOTE — Telephone Encounter (Signed)
I agree it is ok.  Thanks.

## 2013-03-02 ENCOUNTER — Encounter (HOSPITAL_COMMUNITY)
Admission: RE | Admit: 2013-03-02 | Discharge: 2013-03-02 | Disposition: A | Discharge status: Home / Self Care | Payer: Medicare Other | Source: Ambulatory Visit | Attending: General Surgery | Admitting: General Surgery

## 2013-03-02 ENCOUNTER — Encounter (HOSPITAL_COMMUNITY): Payer: Self-pay

## 2013-03-02 ENCOUNTER — Other Ambulatory Visit: Payer: Self-pay | Admitting: Oncology

## 2013-03-02 ENCOUNTER — Telehealth: Payer: Self-pay | Admitting: *Deleted

## 2013-03-02 DIAGNOSIS — Z01812 Encounter for preprocedural laboratory examination: Secondary | ICD-10-CM | POA: Insufficient documentation

## 2013-03-02 DIAGNOSIS — C50919 Malignant neoplasm of unspecified site of unspecified female breast: Secondary | ICD-10-CM

## 2013-03-02 DIAGNOSIS — Z01818 Encounter for other preprocedural examination: Secondary | ICD-10-CM | POA: Insufficient documentation

## 2013-03-02 LAB — CBC WITH DIFFERENTIAL/PLATELET
Basophils Relative: 0 % (ref 0–1)
Eosinophils Absolute: 0.1 10*3/uL (ref 0.0–0.7)
Hemoglobin: 13.9 g/dL (ref 12.0–15.0)
MCH: 33.2 pg (ref 26.0–34.0)
MCHC: 33.7 g/dL (ref 30.0–36.0)
Monocytes Absolute: 0.7 10*3/uL (ref 0.1–1.0)
Neutro Abs: 3.2 10*3/uL (ref 1.7–7.7)
Neutrophils Relative %: 56 % (ref 43–77)

## 2013-03-02 LAB — URINALYSIS, ROUTINE W REFLEX MICROSCOPIC
Ketones, ur: NEGATIVE mg/dL
Nitrite: NEGATIVE
Protein, ur: 30 mg/dL — AB
Specific Gravity, Urine: 1.02 (ref 1.005–1.030)
Urobilinogen, UA: 1 mg/dL (ref 0.0–1.0)

## 2013-03-02 LAB — COMPREHENSIVE METABOLIC PANEL
ALT: 16 U/L (ref 0–35)
Alkaline Phosphatase: 78 U/L (ref 39–117)
CO2: 24 mEq/L (ref 19–32)
Calcium: 9.3 mg/dL (ref 8.4–10.5)
GFR calc Af Amer: >90 mL/min (ref >90)
GFR calc non Af Amer: 88 mL/min — ABNORMAL LOW (ref >90)
Glucose, Bld: 92 mg/dL (ref 70–99)
Sodium: 141 mEq/L (ref 135–145)
Total Bilirubin: 0.6 mg/dL (ref 0.3–1.2)

## 2013-03-02 NOTE — Telephone Encounter (Signed)
Pt is aware her of appt w/ Dr. Basilio Cairo on 03/29/13 @ 10am...td

## 2013-03-02 NOTE — Pre-Procedure Instructions (Signed)
Janice Powell  03/02/2013   Your procedure is scheduled on:  Monday, August 18th  Report to [facility] at 0530 AM.  Call this number if you have problems the morning of surgery: [phone]   Remember:   Do not eat food or drink liquids after midnight.   Take these medicines the morning of surgery with A SIP OF WATER: toprol   Do not wear jewelry, make-up or nail polish.  Do not wear lotions, powders, or perfume,deodorant.  Do not shave 48 hours prior to surgery.   Do not bring valuables to the hospital.  [facility] is not responsible for any belongings or valuables.  Contacts, dentures or bridgework may not be worn into surgery.  Leave suitcase in the car. After surgery it may be brought to your room.  For patients admitted to the hospital, checkout time is 11:00 AM the day of discharge.   Patients discharged the day of surgery will not be allowed to drive home.   Special Instructions: Shower using CHG 2 nights before surgery and the night before surgery.  If you shower the day of surgery use CHG.  Use special wash - you have one bottle of CHG for all showers.  You should use approximately 1/3 of the bottle for each shower.   Please read over the following fact sheets that you were given: Pain Booklet, Coughing and Deep Breathing and Surgical Site Infection Prevention

## 2013-03-08 DIAGNOSIS — C50919 Malignant neoplasm of unspecified site of unspecified female breast: Secondary | ICD-10-CM

## 2013-03-08 HISTORY — DX: Malignant neoplasm of unspecified site of unspecified female breast: C50.919

## 2013-03-10 ENCOUNTER — Ambulatory Visit: Payer: Medicare Other | Admitting: Radiation Oncology

## 2013-03-10 NOTE — H&P (Signed)
Janice Powell   MRN:  [id number]   Description: 67 year old female  Provider: Ernestene Mention, MD  Department: [facility]        Diagnoses    Breast cancer, right    -  Primary    174.9      Reason for Visit    Follow-up    f/u br ca after chemo        Current Vitals - Last Recorded    BP Pulse Temp(Src) Resp Ht Wt    142/90 68 99.1 F (37.3 C) (Temporal) 16 5' 3.5" (1.613 m) 138 lb 3.2 oz (62.687 kg)       BMI 24.09 kg/m2                 History and Physical    Ernestene Mention, MD    Status: Signed                             HPI Janice Powell is a 67 y.o. female.  She returns following completion of neoadjuvant chemotherapy for discussion of definitive surgery for her locally advanced right breast cancer.   She thinks that the right breast tumor is  smaller. The nipple and areola, of course, are still completely destroyed by cancer. This patient noticed a right breast mass in 2011. 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 More recently, she brought it to Dr. Carolee Rota attention. Diagnostic mammography and right ultrasonography at [facility] 03/02/2012 showed a 6.7 cm irregular mass in the inner aspect of the right breast. There was nipple retraction and erythema around the nipple. A large lymph node was noted in the right axilla. By ultrasound the large irregular hypoechoic mass was noted in the breast with multiple enlarged axillary lymph nodes. Physical exam confirmed a hard breast with erythema around the right nipple extending inferiorly. The nipple is slightly inverted. Palpable axillary nodes were noted.   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 Her subsequent history is detailed below   She had previously been evaluated by Dr. Chevis Pretty on Aug. 30, 2013 and had declined any surgical intervention or other  treatment. .   She initially declined chemotherapy, but reconsidered and I placed a Port-A-Cath on 08/15/2012.   She has been receiving neoadjuvant chemotherapy. She has now had 5/6 cycles of a planned 6 cycles. The final cycle was completed June 3.They think that she is responding..   Mid treatment MRI on April 3 shows a 7.5 cm tumor in the right, involving the skin was mildly enlarged right axillary lymph nodes felt to be positive. MRI performed on 01/19/2013 shows good response in the right breast with minimal enhancement. The level I lymph nodes are much smaller and are basically normal. Otherwise there are no abnormal findings on MRI.   She was recently evaluated in the cancer center. They have stated that it is okay to remove her Port-A-Cath and proceed with definitive surgery.         Past Medical History   Diagnosis  Date   .  Hypertension     .  Cancer         right breast   .  Pneumonia         hx   .  GERD (  gastroesophageal reflux disease)         occ   .  Osteopenia           Past Surgical History   Procedure  Laterality  Date   .  Appendectomy    1992   .  Tubal ligation    1979   .  Cholecystectomy    2000   .  Portacath placement    08/15/2012       Procedure: INSERTION PORT-A-CATH;  Surgeon: Ernestene Mention, MD;  Location: [facility] OR;  Service: General;  Laterality: Left;         Family History   Problem  Relation  Age of Onset   .  Heart disease  Mother          Social History History   Substance Use Topics   .  Smoking status:  Never Smoker    .  Smokeless tobacco:  Never Used   .  Alcohol Use:  No        No Known Allergies    Current Outpatient Prescriptions   Medication  Sig  Dispense  Refill   .  lisinopril (PRINIVIL) 10 MG tablet  Take 1 tablet (10 mg total) by mouth daily.   30 tablet   12   .  metoprolol succinate (TOPROL XL) 50 MG 24 hr tablet  Take 1 tablet (50 mg total) by mouth daily. Take with or immediately following a meal.   90  tablet   0   .  naproxen sodium (ANAPROX) 220 MG tablet  Take 220 mg by mouth 2 (two) times daily with a meal.         .  potassium chloride SA (K-DUR,KLOR-CON) 20 MEQ tablet  Starting 12/07/2012 take 1 tablet qid x 5 days then resume bid dose.   70 tablet   6   .  lidocaine-prilocaine (EMLA) cream  Apply 1 application topically as needed.          .  loperamide (IMODIUM) 2 MG capsule  Take 1 capsule (2 mg total) by mouth 4 (four) times daily as needed for diarrhea or loose stools.   12 capsule   0   .  LORazepam (ATIVAN) 0.5 MG tablet  Take 1 tablet (0.5 mg total) by mouth at bedtime as needed for anxiety.   30 tablet   0   .  ondansetron (ZOFRAN) 8 MG tablet  Take 1 tablet (8 mg total) by mouth every 12 (twelve) hours as needed for nausea.   30 tablet   1   .  prochlorperazine (COMPAZINE) 10 MG tablet  Take 10 mg by mouth every 6 (six) hours as needed. Nausea             No current facility-administered medications for this visit.        Review of Systems   Constitutional: Negative for fever, chills and unexpected weight change.  HENT: Negative for hearing loss, congestion, sore throat, trouble swallowing and voice change.   Eyes: Negative for visual disturbance.  Respiratory: Negative for cough and wheezing.   Cardiovascular: Negative for chest pain, palpitations and leg swelling.  Gastrointestinal: Negative for nausea, vomiting, abdominal pain, diarrhea, constipation, blood in stool, abdominal distention and anal bleeding.  Genitourinary: Negative for hematuria, vaginal bleeding and difficulty urinating.  Musculoskeletal: Negative for arthralgias.  Skin: Negative for rash and wound.  Neurological: Negative for seizures, syncope and headaches.  Hematological: Negative for adenopathy. Does not bruise/bleed easily.  Psychiatric/Behavioral: Negative for confusion.      Blood pressure 142/90, pulse 68, temperature 99.1 F (37.3 C), temperature source Temporal, resp. rate 16, height  5' 3.5" (1.613 m), weight 138 lb 3.2 oz (62.687 kg).   Physical Exam  Constitutional: She is oriented to person, place, and time. She appears well-developed and well-nourished. No distress.  HENT:   Head: Normocephalic and atraumatic.   Nose: Nose normal.   Mouth/Throat: No oropharyngeal exudate.  Eyes: Conjunctivae and EOM are normal. Pupils are equal, round, and reactive to light. Left eye exhibits no discharge. No scleral icterus.  Neck: Neck supple. No JVD present. No tracheal deviation present. No thyromegaly present.  Cardiovascular: Normal rate, regular rhythm, normal heart sounds and intact distal pulses.    No murmur heard. Pulmonary/Chest: Effort normal and breath sounds normal. No respiratory distress. She has no wheezes. She has no rales. She exhibits no tenderness.  Port-A-Cath site left infraclavicular area looks good. Right breast is small. The nipple and areola are completely destroyed and there is a chronic dry eschar replacing this. There is some splotchy petechiae throughout the breast but the breast is quite soft and I do not feel a tumor mass anymore. I think that I can perform a mastectomy with a primary closure. There are no palpable lymph nodes in the right axilla.  Abdominal: Soft. Bowel sounds are normal. She exhibits no distension and no mass. There is no tenderness. There is no rebound and no guarding.  Musculoskeletal: She exhibits no edema and no tenderness.  Lymphadenopathy:    She has no cervical adenopathy.  Neurological: She is alert and oriented to person, place, and time. She exhibits normal muscle tone. Coordination normal.  Skin: Skin is warm. No rash noted. She is not diaphoretic. No erythema. No pallor.  Psychiatric: She has a normal mood and affect. Her behavior is normal. Judgment and thought content normal.      Data Reviewed Recent cancer Center notes. Recent breast MRI   Assessment    Locally advanced cancer right breast, initial clinical  stage T4,N2, stage III, TNBC   Status post Port-A-Cath insertion without complications   Good radiographic and clinical response to neoadjuvant chemotherapy. I think we can do a mastectomy with primary skin closure   Hypertension   GERD      Plan    Schedule for a right modified radical mastectomy and Port-A-Cath removal.   I discussed the indications, details, techniques, numerous risks of the surgery with her. She is aware that she will have drains. She is aware of the problems with bleeding, infection, skin necrosis, possible positive margins, arm swelling, or numbness or pain and other unforseen problems. She's been given written information about all these issues. She understands all these issues all her questions were answered. She is in agreement with this plan.           Angelia Mould. Derrell Lolling, M.D., [facility] General and Minimally invasive Surgery Breast and Colorectal Surgery Office:   [phone] Pager:   [phone]

## 2013-03-12 MED ORDER — CEFAZOLIN SODIUM-DEXTROSE 2-3 GM-% IV SOLR
2.0000 g | INTRAVENOUS | Status: DC
  Filled 2013-03-12: qty 50

## 2013-03-13 ENCOUNTER — Ambulatory Visit (HOSPITAL_COMMUNITY): Payer: Medicare Other | Admitting: Registered Nurse

## 2013-03-13 ENCOUNTER — Encounter (HOSPITAL_COMMUNITY): Payer: Self-pay | Admitting: *Deleted

## 2013-03-13 ENCOUNTER — Observation Stay (HOSPITAL_COMMUNITY)
Admission: RE | Admit: 2013-03-13 | Discharge: 2013-03-14 | Disposition: A | Discharge status: Home / Self Care | Payer: Medicare Other | Source: Ambulatory Visit | Attending: General Surgery | Admitting: General Surgery

## 2013-03-13 ENCOUNTER — Encounter (HOSPITAL_COMMUNITY)
Admission: RE | Disposition: A | Discharge status: Home / Self Care | Payer: Self-pay | Source: Ambulatory Visit | Attending: General Surgery

## 2013-03-13 ENCOUNTER — Encounter (HOSPITAL_COMMUNITY): Payer: Self-pay | Admitting: Registered Nurse

## 2013-03-13 DIAGNOSIS — K219 Gastro-esophageal reflux disease without esophagitis: Secondary | ICD-10-CM | POA: Insufficient documentation

## 2013-03-13 DIAGNOSIS — Z452 Encounter for adjustment and management of vascular access device: Secondary | ICD-10-CM | POA: Diagnosis not present

## 2013-03-13 DIAGNOSIS — Z9221 Personal history of antineoplastic chemotherapy: Secondary | ICD-10-CM | POA: Insufficient documentation

## 2013-03-13 DIAGNOSIS — I10 Essential (primary) hypertension: Secondary | ICD-10-CM | POA: Diagnosis not present

## 2013-03-13 DIAGNOSIS — C50919 Malignant neoplasm of unspecified site of unspecified female breast: Secondary | ICD-10-CM | POA: Diagnosis not present

## 2013-03-13 DIAGNOSIS — N6019 Diffuse cystic mastopathy of unspecified breast: Secondary | ICD-10-CM | POA: Diagnosis not present

## 2013-03-13 DIAGNOSIS — Z79899 Other long term (current) drug therapy: Secondary | ICD-10-CM | POA: Diagnosis not present

## 2013-03-13 DIAGNOSIS — C773 Secondary and unspecified malignant neoplasm of axilla and upper limb lymph nodes: Secondary | ICD-10-CM | POA: Insufficient documentation

## 2013-03-13 DIAGNOSIS — C50911 Malignant neoplasm of unspecified site of right female breast: Secondary | ICD-10-CM

## 2013-03-13 HISTORY — PX: MASTECTOMY MODIFIED RADICAL: SHX5962

## 2013-03-13 HISTORY — DX: Malignant neoplasm of unspecified site of unspecified female breast: C50.919

## 2013-03-13 HISTORY — PX: MASTECTOMY: SHX3

## 2013-03-13 HISTORY — PX: PORT-A-CATH REMOVAL: SHX5289

## 2013-03-13 SURGERY — MASTECTOMY, MODIFIED RADICAL
Anesthesia: General | Site: Chest | Laterality: Right | Wound class: Clean

## 2013-03-13 MED ORDER — ENOXAPARIN SODIUM 40 MG/0.4ML ~~LOC~~ SOLN
40.0000 mg | SUBCUTANEOUS | Status: DC
  Administered 2013-03-13: 40 mg via SUBCUTANEOUS
  Filled 2013-03-13 (×2): qty 0.4

## 2013-03-13 MED ORDER — NEOSTIGMINE METHYLSULFATE 1 MG/ML IJ SOLN
INTRAMUSCULAR | Status: DC | PRN
  Administered 2013-03-13: 3 mg via INTRAVENOUS

## 2013-03-13 MED ORDER — LIDOCAINE HCL (CARDIAC) 20 MG/ML IV SOLN
INTRAVENOUS | Status: DC | PRN
  Administered 2013-03-13: 100 mg via INTRAVENOUS

## 2013-03-13 MED ORDER — LACTATED RINGERS IV SOLN
INTRAVENOUS | Status: DC | PRN
  Administered 2013-03-13 (×2): via INTRAVENOUS

## 2013-03-13 MED ORDER — METOPROLOL SUCCINATE ER 50 MG PO TB24
50.0000 mg | ORAL_TABLET | Freq: Every day | ORAL | Status: DC
  Administered 2013-03-14: 50 mg via ORAL
  Filled 2013-03-13: qty 1

## 2013-03-13 MED ORDER — ONDANSETRON HCL 4 MG/2ML IJ SOLN: 4.0000 mg | Freq: Once | INTRAMUSCULAR | Status: DC | PRN

## 2013-03-13 MED ORDER — OXYCODONE HCL 5 MG/5ML PO SOLN: 5.0000 mg | Freq: Once | ORAL | Status: DC | PRN

## 2013-03-13 MED ORDER — METHYLENE BLUE 1 % INJ SOLN
INTRAMUSCULAR | Status: AC
  Filled 2013-03-13: qty 10

## 2013-03-13 MED ORDER — MIDAZOLAM HCL 5 MG/5ML IJ SOLN
INTRAMUSCULAR | Status: DC | PRN
  Administered 2013-03-13: 2 mg via INTRAVENOUS

## 2013-03-13 MED ORDER — PROPOFOL 10 MG/ML IV BOLUS
INTRAVENOUS | Status: DC | PRN
  Administered 2013-03-13: 130 mg via INTRAVENOUS

## 2013-03-13 MED ORDER — MEPERIDINE HCL 25 MG/ML IJ SOLN: 6.2500 mg | INTRAMUSCULAR | Status: DC | PRN

## 2013-03-13 MED ORDER — MORPHINE SULFATE 2 MG/ML IJ SOLN: 2.0000 mg | INTRAMUSCULAR | Status: DC | PRN

## 2013-03-13 MED ORDER — POTASSIUM CHLORIDE IN NACL 20-0.9 MEQ/L-% IV SOLN
INTRAVENOUS | Status: DC
  Administered 2013-03-13 (×2): via INTRAVENOUS
  Filled 2013-03-13 (×4): qty 1000

## 2013-03-13 MED ORDER — HYDROMORPHONE HCL PF 1 MG/ML IJ SOLN
0.2500 mg | INTRAMUSCULAR | Status: DC | PRN
  Administered 2013-03-13: 0.5 mg via INTRAVENOUS

## 2013-03-13 MED ORDER — LIDOCAINE-EPINEPHRINE (PF) 1 %-1:200000 IJ SOLN
INTRAMUSCULAR | Status: AC
  Filled 2013-03-13: qty 10

## 2013-03-13 MED ORDER — GLYCOPYRROLATE 0.2 MG/ML IJ SOLN
INTRAMUSCULAR | Status: DC | PRN
  Administered 2013-03-13: 0.4 mg via INTRAVENOUS

## 2013-03-13 MED ORDER — LISINOPRIL 10 MG PO TABS
10.0000 mg | ORAL_TABLET | Freq: Every day | ORAL | Status: DC
  Administered 2013-03-13 – 2013-03-14 (×2): 10 mg via ORAL
  Filled 2013-03-13 (×2): qty 1

## 2013-03-13 MED ORDER — FENTANYL CITRATE 0.05 MG/ML IJ SOLN
INTRAMUSCULAR | Status: DC | PRN
  Administered 2013-03-13 (×2): 50 ug via INTRAVENOUS
  Administered 2013-03-13: 150 ug via INTRAVENOUS
  Administered 2013-03-13 (×3): 50 ug via INTRAVENOUS

## 2013-03-13 MED ORDER — ONDANSETRON HCL 4 MG/2ML IJ SOLN
4.0000 mg | Freq: Four times a day (QID) | INTRAMUSCULAR | Status: DC | PRN
  Administered 2013-03-13: 4 mg via INTRAVENOUS
  Filled 2013-03-13 (×2): qty 2

## 2013-03-13 MED ORDER — 0.9 % SODIUM CHLORIDE (POUR BTL) OPTIME
TOPICAL | Status: DC | PRN
  Administered 2013-03-13 (×2): 1000 mL

## 2013-03-13 MED ORDER — DEXAMETHASONE SODIUM PHOSPHATE 10 MG/ML IJ SOLN
INTRAMUSCULAR | Status: DC | PRN
  Administered 2013-03-13: 4 mg via INTRAVENOUS

## 2013-03-13 MED ORDER — CHLORHEXIDINE GLUCONATE 4 % EX LIQD: 1.0000 "application " | Freq: Once | CUTANEOUS | Status: DC

## 2013-03-13 MED ORDER — OXYCODONE HCL 5 MG PO TABS
5.0000 mg | ORAL_TABLET | ORAL | Status: DC | PRN
  Administered 2013-03-14 (×2): 5 mg via ORAL
  Filled 2013-03-13 (×2): qty 1

## 2013-03-13 MED ORDER — ONDANSETRON HCL 4 MG/2ML IJ SOLN
INTRAMUSCULAR | Status: DC | PRN
  Administered 2013-03-13: 4 mg via INTRAVENOUS

## 2013-03-13 MED ORDER — HYDROMORPHONE HCL PF 1 MG/ML IJ SOLN
INTRAMUSCULAR | Status: AC
  Administered 2013-03-13: 0.25 mg via INTRAVENOUS
  Filled 2013-03-13: qty 1

## 2013-03-13 MED ORDER — LABETALOL HCL 5 MG/ML IV SOLN: 5.0000 mg | Freq: Once | INTRAVENOUS | Status: DC

## 2013-03-13 MED ORDER — HEPARIN SODIUM (PORCINE) 5000 UNIT/ML IJ SOLN
5000.0000 [IU] | Freq: Once | INTRAMUSCULAR | Status: AC
  Administered 2013-03-13: 5000 [IU] via SUBCUTANEOUS
  Filled 2013-03-13: qty 1

## 2013-03-13 MED ORDER — NAPROXEN 250 MG PO TABS: 250.0000 mg | ORAL_TABLET | Freq: Two times a day (BID) | ORAL | Status: DC | PRN

## 2013-03-13 MED ORDER — OXYCODONE HCL 5 MG PO TABS: 5.0000 mg | ORAL_TABLET | Freq: Once | ORAL | Status: DC | PRN

## 2013-03-13 MED ORDER — NAPROXEN SODIUM 220 MG PO CAPS: 220.0000 mg | ORAL_CAPSULE | Freq: Two times a day (BID) | ORAL | Status: DC | PRN

## 2013-03-13 MED ORDER — ROCURONIUM BROMIDE 100 MG/10ML IV SOLN
INTRAVENOUS | Status: DC | PRN
  Administered 2013-03-13: 50 mg via INTRAVENOUS

## 2013-03-13 MED ORDER — CEFAZOLIN SODIUM-DEXTROSE 2-3 GM-% IV SOLR
INTRAVENOUS | Status: DC | PRN
  Administered 2013-03-13: 2 g via INTRAVENOUS

## 2013-03-13 SURGICAL SUPPLY — 70 items
ADH SKN CLS APL DERMABOND .7 (GAUZE/BANDAGES/DRESSINGS) ×4
ADH SKN CLS LQ APL DERMABOND (GAUZE/BANDAGES/DRESSINGS) ×2
APL SKNCLS STERI-STRIP NONHPOA (GAUZE/BANDAGES/DRESSINGS)
APPLIER CLIP 9.375 MED OPEN (MISCELLANEOUS) ×3
APR CLP MED 9.3 20 MLT OPN (MISCELLANEOUS) ×2
BENZOIN TINCTURE PRP APPL 2/3 (GAUZE/BANDAGES/DRESSINGS) ×2 IMPLANT
BINDER BREAST LRG (GAUZE/BANDAGES/DRESSINGS) ×1 IMPLANT
BINDER BREAST XLRG (GAUZE/BANDAGES/DRESSINGS) IMPLANT
BIOPATCH RED 1 DISK 7.0 (GAUZE/BANDAGES/DRESSINGS) ×2 IMPLANT
BLADE SURG 15 STRL LF DISP TIS (BLADE) ×2 IMPLANT
BLADE SURG 15 STRL SS (BLADE) ×3
CANISTER SUCTION 2500CC (MISCELLANEOUS) ×3 IMPLANT
CHLORAPREP W/TINT 10.5 ML (MISCELLANEOUS) ×3 IMPLANT
CHLORAPREP W/TINT 26ML (MISCELLANEOUS) ×3 IMPLANT
CLIP APPLIE 9.375 MED OPEN (MISCELLANEOUS) ×2 IMPLANT
CLOTH BEACON ORANGE TIMEOUT ST (SAFETY) ×3 IMPLANT
CONT SPEC 4OZ CLIKSEAL STRL BL (MISCELLANEOUS) IMPLANT
COVER SURGICAL LIGHT HANDLE (MISCELLANEOUS) ×3 IMPLANT
DECANTER SPIKE VIAL GLASS SM (MISCELLANEOUS) ×2 IMPLANT
DERMABOND ADHESIVE PROPEN (GAUZE/BANDAGES/DRESSINGS) ×1
DERMABOND ADVANCED (GAUZE/BANDAGES/DRESSINGS) ×2
DERMABOND ADVANCED .7 DNX12 (GAUZE/BANDAGES/DRESSINGS) IMPLANT
DERMABOND ADVANCED .7 DNX6 (GAUZE/BANDAGES/DRESSINGS) ×2 IMPLANT
DRAIN CHANNEL 19F RND (DRAIN) ×4 IMPLANT
DRAPE LAPAROSCOPIC ABDOMINAL (DRAPES) ×3 IMPLANT
DRAPE PED LAPAROTOMY (DRAPES) ×2 IMPLANT
DRAPE UTILITY 15X26 W/TAPE STR (DRAPE) ×6 IMPLANT
DRSG ADAPTIC 3X8 NADH LF (GAUZE/BANDAGES/DRESSINGS) ×4 IMPLANT
DRSG PAD ABDOMINAL 8X10 ST (GAUZE/BANDAGES/DRESSINGS) ×6 IMPLANT
DRSG TEGADERM 4X4.75 (GAUZE/BANDAGES/DRESSINGS) ×1 IMPLANT
ELECT BLADE 4.0 EZ CLEAN MEGAD (MISCELLANEOUS) ×3
ELECT CAUTERY BLADE 6.4 (BLADE) ×3 IMPLANT
ELECT REM PT RETURN 9FT ADLT (ELECTROSURGICAL) ×3
ELECTRODE BLDE 4.0 EZ CLN MEGD (MISCELLANEOUS) ×2 IMPLANT
ELECTRODE REM PT RTRN 9FT ADLT (ELECTROSURGICAL) ×2 IMPLANT
EVACUATOR SILICONE 100CC (DRAIN) ×6 IMPLANT
GAUZE SPONGE 4X4 16PLY XRAY LF (GAUZE/BANDAGES/DRESSINGS) ×3 IMPLANT
GLOVE BIO SURGEON STRL SZ7.5 (GLOVE) ×1 IMPLANT
GLOVE BIOGEL PI IND STRL 7.0 (GLOVE) IMPLANT
GLOVE BIOGEL PI IND STRL 8 (GLOVE) IMPLANT
GLOVE BIOGEL PI INDICATOR 7.0 (GLOVE) ×4
GLOVE BIOGEL PI INDICATOR 8 (GLOVE) ×1
GLOVE EUDERMIC 7 POWDERFREE (GLOVE) ×3 IMPLANT
GLOVE SURG SIGNA 7.5 PF LTX (GLOVE) ×1 IMPLANT
GLOVE SURG SS PI 6.5 STRL IVOR (GLOVE) ×2 IMPLANT
GLOVE SURG SS PI 7.0 STRL IVOR (GLOVE) ×1 IMPLANT
GOWN STRL NON-REIN LRG LVL3 (GOWN DISPOSABLE) ×6 IMPLANT
GOWN STRL REIN XL XLG (GOWN DISPOSABLE) ×5 IMPLANT
KIT BASIN OR (CUSTOM PROCEDURE TRAY) ×3 IMPLANT
KIT ROOM TURNOVER OR (KITS) ×3 IMPLANT
NDL HYPO 25GX1X1/2 BEV (NEEDLE) ×2 IMPLANT
NEEDLE HYPO 25GX1X1/2 BEV (NEEDLE) ×3 IMPLANT
NS IRRIG 1000ML POUR BTL (IV SOLUTION) ×4 IMPLANT
PACK GENERAL/GYN (CUSTOM PROCEDURE TRAY) ×3 IMPLANT
PACK SURGICAL SETUP 50X90 (CUSTOM PROCEDURE TRAY) ×2 IMPLANT
PAD ARMBOARD 7.5X6 YLW CONV (MISCELLANEOUS) ×6 IMPLANT
PENCIL BUTTON HOLSTER BLD 10FT (ELECTRODE) ×3 IMPLANT
SPECIMEN JAR X LARGE (MISCELLANEOUS) ×3 IMPLANT
SPONGE GAUZE 4X4 12PLY (GAUZE/BANDAGES/DRESSINGS) ×5 IMPLANT
SPONGE LAP 18X18 X RAY DECT (DISPOSABLE) ×2 IMPLANT
STAPLER VISISTAT 35W (STAPLE) ×3 IMPLANT
STRIP CLOSURE SKIN 1/2X4 (GAUZE/BANDAGES/DRESSINGS) ×2 IMPLANT
SUT ETHILON 3 0 FSL (SUTURE) ×7 IMPLANT
SUT MNCRL AB 4-0 PS2 18 (SUTURE) ×3 IMPLANT
SUT SILK 2 0 FS (SUTURE) ×3 IMPLANT
SUT VIC AB 3-0 SH 18 (SUTURE) ×5 IMPLANT
SYR CONTROL 10ML LL (SYRINGE) ×2 IMPLANT
TOWEL OR 17X24 6PK STRL BLUE (TOWEL DISPOSABLE) ×3 IMPLANT
TOWEL OR 17X26 10 PK STRL BLUE (TOWEL DISPOSABLE) ×3 IMPLANT
WATER STERILE IRR 1000ML POUR (IV SOLUTION) IMPLANT

## 2013-03-13 NOTE — Op Note (Signed)
Patient Name:           Janice Powell   Date of Surgery:        03/13/2013  Pre op Diagnosis:      Locally advanced invasive cancer right breast, triple negative breast cancer, pretreatment clinical stage T4, N2, stage III  Post op Diagnosis:    same  Procedure:                 Removal of Port-A-Cath, right modified radical mastectomy  Surgeon:                     Angelia Mould. Derrell Lolling, M.D., FACS  Assistant:                      Ovidio Kin, M.D., FACS  Operative Indications:   GLENDINE Powell is a 67 y.o. female. She returns following completion of neoadjuvant chemotherapy for discussion of definitive surgery for her locally advanced right breast cancer.  She thinks that the right breast tumor is smaller. The nipple and areola, of course, are still completely destroyed by cancer.  This patient noticed a right breast mass in 2011. 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 More recently, she brought it to Dr. Carolee Rota attention. Diagnostic mammography and right ultrasonography at [facility] 03/02/2012 showed a 6.7 cm irregular mass in the inner aspect of the right breast. There was nipple retraction and erythema around the nipple. A large lymph node was noted in the right axilla. By ultrasound the large irregular hypoechoic mass was noted in the breast with multiple enlarged axillary lymph nodes. Physical exam confirmed a hard breast with erythema around the right nipple extending inferiorly. The nipple was slightly inverted. Palpable axillary nodes were noted.  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 Her subsequent history is detailed below  She had previously been evaluated by Dr. Chevis Pretty on Aug. 30, 2013 and had declined any surgical intervention or other treatment. .  She initially declined chemotherapy, but reconsidered and I placed a Port-A-Cath on 08/15/2012.   She has been receiving neoadjuvant chemotherapy. She has now had 5/6 cycles of a planned 6 cycles. The final cycle was completed June 3.They think that she is responding..  Mid treatment MRI on April 3 shows a 7.5 cm tumor in the right, involving the skin was mildly enlarged right axillary lymph nodes felt to be positive. MRI performed on 01/19/2013 shows good response in the right breast with minimal enhancement. The level I lymph nodes are much smaller and are basically normal. Otherwise there are no abnormal findings on MRI. My recent exam reveals healing of the skin overlying the areola. A few petechiae persist in the breast skin do not feel a tumor mass anywhere. I can no longer feel the lymph nodes in the right axilla. She was recently evaluated in the cancer center. They have stated that it is okay to remove her Port-A-Cath and proceed with definitive surgery   Operative Findings:       I was able to perform a right modified radical mastectomy with primary closure of the skin. The skin and the breast tissue and the pectoralis muscle and x-ray tissues were slightly edematous, consistent with neoadjuvant therapy.  Procedure in Detail:          Following the induction of general  endotracheal anesthesia, the patient's entire right chest wall and axilla and left chest wall were prepped and draped in a sterile fashion. Intravenous antibiotics were given. Surgical time out was performed.  I removed the Port-A-Cath by making an incision in the left infraclavicular area through the old scar. I dissected down to the capsule of the port and mobilized the port up and divided and removed all the 3 Prolene sutures. The port and catheter were then easily removed. The deeper tissues were closed with interrupted sutures of 3-0 Vicryl and the skin closed with a running subcuticular suture of 4-0 Monocryl and Dermabond. There was no bleeding.  I then used a marking pen to mark a transverse elliptical incision in  the right breast to remove as much skin as possible. An incision was made. Skin flaps were raised superiorly  to the infraclavicular area, medially to the parasternal area, inferiorly to the anterior rectus sheath and laterally to latissimus dorsi muscle. The breast tissue was dissected off of the pectoralis major and minor muscles with electrocautery. I continued my  dissection up into the axilla by dividing the clavipectoral fascia. I took  the dissection all the way up to the axillary vein. Couple of tributaries of the axillary vein were controlled with metal clips and divided. The long thoracic nerve and the thoracodorsal neurovascular bundle were identified and preserved and were functioning at the end of the case. I removed all the level I and level II lymph nodes with the specimen. I marked the lateral skin margin of the right mastectomy specimen with a silk suture and the specimen was sent to pathology. Hemostasis was excellent and achieved with electrocautery and metal clips. The wound was irrigated with saline. Two 19-French Blake drains were placed one up into the axilla and one across the skin flaps. These were brought out through separate stab incisions inferolaterally and sutured to the skin with nylon sutures. Mastectomy incision was closed in layers. Subcutaneous tissue was closed with interrupted sutures of 3-0 Vicryl. A couple of nylon sutures were placed in the skin to hold things in place during the procedure. The skin was then closed with a running subcuticular suture of 4-0 Monocryl and Dermabond. We had good seal on the drains.  At this point in the case the patient was stable. It had been no complications. EBL 75 cc or less. Counts correct. Complications none. The patient was taken to the recovery room.     Angelia Mould. Derrell Lolling, M.D., FACS General and Minimally Invasive Surgery Breast and Colorectal Surgery  03/13/2013 9:25 AM

## 2013-03-13 NOTE — Anesthesia Postprocedure Evaluation (Signed)
Anesthesia Post Note  Patient: Janice Powell  Procedure(s) Performed: Procedure(s) (LRB): MASTECTOMY MODIFIED RADICAL (Right) REMOVAL PORT-A-CATH (Left)  Anesthesia type: general  Patient location: PACU  Post pain: Pain level controlled  Post assessment: Patient's Cardiovascular Status Stable  Last Vitals:  Filed Vitals:   03/13/13 1220  BP: 160/73  Pulse:   Temp:   Resp:     Post vital signs: Reviewed and stable  Level of consciousness: sedated  Complications: No apparent anesthesia complications

## 2013-03-13 NOTE — Preoperative (Signed)
Beta Blockers   Reason not to administer Beta Blockers:Not Applicable 

## 2013-03-13 NOTE — Progress Notes (Signed)
Pt's initial blood pressure reading 223/96 in R.arm by automatic reading, Dr.Ossey notified, states pt reported this am that an automatic reading will always read higher than a manual cuff reading, no interventions needed at this time, but new orders rec'd to administer 5mg  IV Labetolol if Diastolic is above 100, will continue to monitor.

## 2013-03-13 NOTE — Anesthesia Preprocedure Evaluation (Addendum)
Anesthesia Evaluation  Patient identified by MRN, date of birth, ID band Patient awake    Reviewed: Allergy & Precautions, H&P , NPO status , Patient's Chart, lab work & pertinent test results, reviewed documented beta blocker date and time   Airway Mallampati: II TM Distance: >3 FB Neck ROM: Full  Mouth opening: Limited Mouth Opening  Dental  (+) Partial Upper and Dental Advisory Given   Pulmonary          Cardiovascular hypertension, Pt. on medications and Pt. on home beta blockers     Neuro/Psych    GI/Hepatic GERD-  Controlled,  Endo/Other    Renal/GU      Musculoskeletal   Abdominal   Peds  Hematology   Anesthesia Other Findings   Reproductive/Obstetrics                         Anesthesia Physical Anesthesia Plan  ASA: III  Anesthesia Plan: General   Post-op Pain Management:    Induction: Intravenous  Airway Management Planned: Oral ETT  Additional Equipment:   Intra-op Plan:   Post-operative Plan: Extubation in OR  Informed Consent: I have reviewed the patients History and Physical, chart, labs and discussed the procedure including the risks, benefits and alternatives for the proposed anesthesia with the patient or authorized representative who has indicated his/her understanding and acceptance.   Dental advisory given  Plan Discussed with: CRNA and Surgeon  Anesthesia Plan Comments: (Pt has systolic HTN on Dynamap. Known cuff Systolic less. SBP 230  OK to proceed.)       Anesthesia Quick Evaluation

## 2013-03-13 NOTE — Transfer of Care (Signed)
Immediate Anesthesia Transfer of Care Note  Patient: Janice Powell  Procedure(s) Performed: Procedure(s): MASTECTOMY MODIFIED RADICAL (Right) REMOVAL PORT-A-CATH (Left)  Patient Location: PACU  Anesthesia Type:General  Level of Consciousness: awake  Airway & Oxygen Therapy: Patient Spontanous Breathing and Patient connected to nasal cannula oxygen  Post-op Assessment: Report given to PACU RN  Post vital signs: Reviewed  Complications: No apparent anesthesia complications

## 2013-03-13 NOTE — Progress Notes (Signed)
Dr. Michelle Piper notified of BP 250/87 this am with dinamapp.

## 2013-03-13 NOTE — Interval H&P Note (Signed)
History and Physical Interval Note:  03/13/2013 6:38 AM  Janice Powell  has presented today for surgery, with the diagnosis of rt breast cancer  The goals and the various methods of treatment have been discussed with the patient and family. After consideration of risks, benefits and other options for treatment, the patient has consented to  Procedure(s): MASTECTOMY MODIFIED RADICAL (Right) REMOVAL PORT-A-CATH (N/A) as a surgical intervention .  The patient's history has been reviewed, patient examined today, no change in status, stable for surgery.  I have reviewed the patient's chart and labs.  Questions were answered to the patient's satisfaction.     Ernestene Mention

## 2013-03-14 ENCOUNTER — Encounter (HOSPITAL_COMMUNITY): Payer: Self-pay | Admitting: General Surgery

## 2013-03-14 MED ORDER — HYDROCODONE-ACETAMINOPHEN 5-325 MG PO TABS: 1.0000 | ORAL_TABLET | ORAL | Status: DC | PRN

## 2013-03-14 NOTE — Progress Notes (Signed)
Discharge home. Home discharge instruction given, no question verbalized. JP teaching done with daughter and spouse.

## 2013-03-14 NOTE — Progress Notes (Signed)
Pt was up and dressed when I arrived; expecting to be discharged. Pt was thankful for visit and asked for prayer. After prayer pt shared her diagnosis of breast cancer; she seems to feel responsible since she did not have mammograms. We talked about her experience being one where she can help and encourage others to visit drs and take care of themselves. She was thankful for visit and prayer. Marjory Lies Chaplain  03/14/13 1100  Clinical Encounter Type  Visited With Patient

## 2013-03-14 NOTE — Discharge Summary (Signed)
Patient ID: Janice Powell [id number] 67 y.o. Dec 29, 1945  Admit date: 03/13/2013  Discharge date and time: No discharge date for patient encounter.  Admitting Physician: Ernestene Mention  Discharge Physician: Ernestene Mention  Admission Diagnoses: rt breast cancer  Discharge Diagnoses: Locally advanced invasive cancer right breast, triple negative breast cancer, pretreatment clinical stage T4, N2, stage III Status post neoadjuvant chemotherapy. Hypertension GERD.   Operations: Procedure(s): MASTECTOMY MODIFIED RADICAL REMOVAL PORT-A-CATH  Admission Condition: good  Discharged Condition: good  Indication for Admission: Janice Powell is a 67 y.o. Female.  She is admitted electively for mastectomy and Port-A-Cath removal following neoadjuvant chemotherapy. This patient noticed a right breast mass in 2011. 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 In  2013 she brought it to Dr. Carolee Rota attention. Diagnostic mammography and right ultrasonography at [facility] 03/02/2012 showed a 6.7 cm irregular mass in the inner aspect of the right breast. There was nipple retraction and erythema around the nipple. A large lymph node was noted in the right axilla. By ultrasound the large irregular hypoechoic mass was noted in the breast with multiple enlarged axillary lymph nodes. Physical exam confirmed a hard breast with erythema around the right nipple extending inferiorly. The nipple was slightly inverted. Palpable axillary nodes were noted.  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   She had previously been evaluated by Dr. Chevis Pretty on Aug. 30, 2013 and had declined any surgical intervention or other treatment. .  She initially declined chemotherapy, but reconsidered and I placed a Port-A-Cath on 08/15/2012.  She has been receiving neoadjuvant chemotherapy. She has  now had 5/6 cycles of a planned 6 cycles. The final cycle was completed June 3...  Mid treatment MRI on April 3 shows a 7.5 cm tumor in the right, involving the skin was mildly enlarged right axillary lymph nodes felt to be positive. MRI performed on 01/19/2013 shows good response in the right breast with minimal enhancement. The level I lymph nodes are much smaller and are basically normal. Otherwise there are no abnormal findings on MRI. My recent exam reveals healing of the skin overlying the areola. A few petechiae persist in the breast skin do not feel a tumor mass anywhere. I can no longer feel the lymph nodes in the right axilla.  She was recently evaluated in the cancer center. They have stated that it is okay to remove her Port-A-Cath and proceed with definitive surgery   Hospital Course: on the day of admission the patient underwent a right modified radical mastectomy and removal of her Port-A-Cath. The surgery was uneventful. Pathology is pending at this time. She was observed overnight.she was initially hypertensive preop but her blood pressure is normal at the time of discharge.BP 158/67. Repeat 128/51 heart rate 65. She feels good on POD 1 and wants to go home. Right mastectomy wound was carefully examined. The skin is healthy. There is no hematoma or retained fluid. She has drained approximately 500 cc of serosanguineous fluid since surgery but this is now subsiding and slowing down. Pain control is very good. Diet and activities were discussed. Drain care and record keeping were discussed. She will be given a prescription for Vicodin for pain and to resume her usual medications. She was told to check with Dr. Merri Brunette regarding her blood pressure. She was to take a multivitamin with extra iron daily.  She will return to see Dr. Derrell Lolling in one week.  Consults: None  Significant Diagnostic Studies: surgical pathology, pending  Treatments: surgery: right modified radical mastectomy, removal  of Port-A-Cath.  Disposition: Home  Patient Instructions:    Medication List         ALEVE 220 MG Caps  Generic drug:  Naproxen Sodium  Take 220 mg by mouth 2 (two) times daily as needed (for pain).     HYDROcodone-acetaminophen 5-325 MG per tablet  Commonly known as:  NORCO/VICODIN  Take 1-2 tablets by mouth every 4 (four) hours as needed for pain.     lisinopril 10 MG tablet  Commonly known as:  PRINIVIL  Take 1 tablet (10 mg total) by mouth daily.     metoprolol succinate 50 MG 24 hr tablet  Commonly known as:  TOPROL XL  Take 1 tablet (50 mg total) by mouth daily. Take with or immediately following a meal.        Activity: activity as tolerated. 15 pound limit on lifting. Do not drive a car for now. You will be referred to physical therapy after the drains are removed. Diet: low fat, low cholesterol diet Wound Care: as instructed. Keep the wound clean and dry. Keep a record of drainage.  Follow-up:  With Dr. Derrell Lolling in 1 week.  Signed: Angelia Mould. Derrell Lolling, M.D., FACS General and minimally invasive surgery Breast and Colorectal Surgery  03/14/2013, 6:36 AM

## 2013-03-15 NOTE — Progress Notes (Signed)
Quick Note:  Inform patient of Pathology report,.Tell her that the chemo completely eliminated the cancer in the breast, but there was still cancer in the lymph nodes. She will not need any further surgery. I will discuss with her at the next office visit. hmi ______

## 2013-03-16 ENCOUNTER — Telehealth (INDEPENDENT_AMBULATORY_CARE_PROVIDER_SITE_OTHER): Payer: Self-pay

## 2013-03-16 NOTE — Telephone Encounter (Signed)
Message copied by Ivory Broad on Thu Mar 16, 2013  8:56 AM ------      Message from: Ernestene Mention      Created: Wed Mar 15, 2013  7:27 PM       Inform patient of Pathology report,.Tell her that the chemo completely eliminated the cancer in the breast, but there was still cancer in the lymph nodes. She will not need any further surgery.  I will discuss with her at the next office visit.      hmi ------

## 2013-03-16 NOTE — Telephone Encounter (Signed)
Patient called back at this time and was given below message.  Patient states understanding and agreeable with the plan at this time.

## 2013-03-16 NOTE — Telephone Encounter (Signed)
Left message for pt to call so I can give her message below for her pathology results.  We will give her a copy at her follow up visit too.

## 2013-03-23 ENCOUNTER — Ambulatory Visit (INDEPENDENT_AMBULATORY_CARE_PROVIDER_SITE_OTHER): Payer: Medicare Other | Admitting: General Surgery

## 2013-03-23 ENCOUNTER — Encounter (INDEPENDENT_AMBULATORY_CARE_PROVIDER_SITE_OTHER): Payer: Self-pay | Admitting: General Surgery

## 2013-03-23 VITALS — BP 160/92 | HR 72 | Temp 98.0°F | Resp 14 | Ht 63.5 in | Wt 136.8 lb

## 2013-03-23 DIAGNOSIS — C50911 Malignant neoplasm of unspecified site of right female breast: Secondary | ICD-10-CM

## 2013-03-23 DIAGNOSIS — C50919 Malignant neoplasm of unspecified site of unspecified female breast: Secondary | ICD-10-CM

## 2013-03-23 NOTE — Progress Notes (Signed)
Patient ID: Janice Powell, female   DOB: 1945-12-16, 67 y.o.   MRN: [id number] History: This patient underwent right modified radical mastectomy and removal of Port-A-Cath on 03/13/2013. Final pathology report showed complete pathologic response in the breast, however 9 out of 9 lymph nodes were positive for metastatic disease. She is doing well. She reports some numbness of the skin inferolateral to the mastectomy incision. The drains areraining at least 20 cc per day.    This patient noticed a right breast mass in 2011.  Diagnostic mammography and right ultrasonography at [facility] 03/02/2012 showed a 6.7 cm irregular mass in the inner aspect of the right breast. There was nipple retraction and erythema around the nipple. A large lymph node was noted in the right axilla. By ultrasound the large irregular hypoechoic mass was noted in the breast with multiple enlarged axillary lymph nodes. Physical exam confirmed a hard breast with erythema around the right nipple extending inferiorly. The nipple was slightly inverted. Palpable axillary nodes were noted.  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  She initially declined chemotherapy, but reconsidered and I placed a Port-A-Cath on 08/15/2012.  She received  neoadjuvant chemotherapy..  MRI performed on 01/19/2013 showed good response in the right breast with minimal enhancement. The level I lymph nodes are much smaller and are basically normal. Otherwise there are no abnormal findings on MRI.   Final pathology report shows a complete pathologic response in the breast, but 9 out of 9 axillary nodes are positive for metastatic disease. She has followup Dr. Basilio Cairo, anticipating Exam: radiation therapy the chest wall, and with Dr. Darnelle Catalan.  Patient looks well. No distress. Right mastectomy wound is healing uneventfully. Minimal discoloration. No skin necrosis. No infection. 2  nylon sutures were removed. The wound was redressed.  Assessment: Locally advanced triple negative breast cancer, right breast. Recovering uneventfully in the early postoperative following right modified radical mastectomy and removal of Port-A-Cath Status post neo-adjuvant chemotherapy  Plan: Range of motion exercises right shoulder discussed. Return to see me in one week. Hopefully we can get the drains out and  and refer to physical therapy Keep appointments with Dr. Darnelle Catalan and with Dr. Dorann Ou M. Derrell Lolling, M.D., [facility] General and Minimally invasive Surgery Breast and Colorectal Surgery Office:   [phone] Pager:   [phone]

## 2013-03-23 NOTE — Patient Instructions (Signed)
Right mastectomy wound is healing without any obvious complications. I removed a couple of sutures today.  It is still draining too much to take the drain  out today.  Return to see Dr. Derrell Lolling in one week. Hopefully we can remove both drains in.  Continue to keep a careful record of the drainage  Keep your appointments with Dr. Darnelle Catalan and with Dr. Basilio Cairo. That is very important.

## 2013-03-29 ENCOUNTER — Ambulatory Visit
Admission: RE | Admit: 2013-03-29 | Discharge: 2013-03-29 | Disposition: A | Discharge status: Home / Self Care | Payer: Medicare Other | Source: Ambulatory Visit | Attending: Radiation Oncology | Admitting: Radiation Oncology

## 2013-03-29 ENCOUNTER — Encounter: Payer: Self-pay | Admitting: Radiation Oncology

## 2013-03-29 VITALS — BP 190/98 | HR 90 | Temp 98.1°F | Ht 63.5 in | Wt 138.1 lb

## 2013-03-29 DIAGNOSIS — M899 Disorder of bone, unspecified: Secondary | ICD-10-CM | POA: Diagnosis not present

## 2013-03-29 DIAGNOSIS — Z79899 Other long term (current) drug therapy: Secondary | ICD-10-CM | POA: Insufficient documentation

## 2013-03-29 DIAGNOSIS — C50919 Malignant neoplasm of unspecified site of unspecified female breast: Secondary | ICD-10-CM | POA: Diagnosis not present

## 2013-03-29 DIAGNOSIS — I10 Essential (primary) hypertension: Secondary | ICD-10-CM | POA: Diagnosis not present

## 2013-03-29 DIAGNOSIS — C50911 Malignant neoplasm of unspecified site of right female breast: Secondary | ICD-10-CM

## 2013-03-29 DIAGNOSIS — C773 Secondary and unspecified malignant neoplasm of axilla and upper limb lymph nodes: Secondary | ICD-10-CM | POA: Diagnosis not present

## 2013-03-29 DIAGNOSIS — Z9221 Personal history of antineoplastic chemotherapy: Secondary | ICD-10-CM | POA: Insufficient documentation

## 2013-03-29 DIAGNOSIS — C50219 Malignant neoplasm of upper-inner quadrant of unspecified female breast: Secondary | ICD-10-CM | POA: Diagnosis not present

## 2013-03-29 DIAGNOSIS — Z9010 Acquired absence of unspecified breast and nipple: Secondary | ICD-10-CM | POA: Diagnosis not present

## 2013-03-29 DIAGNOSIS — Z171 Estrogen receptor negative status [ER-]: Secondary | ICD-10-CM | POA: Diagnosis not present

## 2013-03-29 DIAGNOSIS — C801 Malignant (primary) neoplasm, unspecified: Secondary | ICD-10-CM

## 2013-03-29 NOTE — Progress Notes (Signed)
Location of Breast Cancer: Right Breast  Histology per Pathology Report:  03/13/13 Breast, modified radical mastectomy , right - FIBROSIS WITH PATCHY CHRONIC INFLAMMATION. - FIBROCYSTIC CHANGES WITH CALCIFICATIONS. - NO RESIDUAL CARCINOMA IDENTIFIED WITHIN MASTECTOMY. - METASTATIC CARCINOMA IN NINE OF NINE LYMPH NODES (9/9)  Receptor Status:Estrogen receptor: 0%, negative.Progesterone receptor: 0%, negative.Her 2 neu: No amplification, ratio 1.42.Ki-67: 93%  Did patient present with symptoms (if so, please note symptoms) or was this found on screening mammography?:   This patient noticed a right breast mass in 2011. 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 In 2013 she brought it to Dr. Carolee Rota attention. Diagnostic mammography and right ultrasonography at [facility] 03/02/2012 showed a 6.7 cm irregular mass in the inner aspect of the right breast. There was nipple retraction and erythema around the nipple. A large lymph node was noted in the right axilla. By ultrasound the large irregular hypoechoic mass was noted in the breast with multiple enlarged axillary lymph nodes. Physical exam confirmed a hard breast with erythema around the right nipple extending inferiorly. The nipple was slightly inverted. Palpable axillary nodes were noted   Past/Anticipated interventions by surgeon, if any: Right Breast Modified Radical Mastectomy  Past/Anticipated interventions by medical oncology, if any: appointment with Dr. Darnelle Catalan on 03/31/13  Lymphedema issues, if any:   Pain issues, if UJW:JXBJYNW Achy/throbbing pain in the right chest area, axilla and right upper arm.  She has marked decrease in range of motion in her right arm  SAFETY ISSUES:  Prior radiation? No  Pacemaker/ICD? No  Possible current pregnancy?No  Is the patient on methotrexate? No  Current Complaints / other details:  Drains are present right breast area with report an average of 20  cc's daily.  To see her surgeon on this Friday.

## 2013-03-29 NOTE — Progress Notes (Signed)
Radiation Oncology         [phone] ________________________________  Initial outpatient Consultation  Name: Janice Powell MRN: [id number]  Date: 03/29/2013  DOB: 16-Mar-1946  FA:OZHYQ,MVHQIO DAVIDSON, MD  Magrinat, Valentino Hue, MD   REFERRING PHYSICIAN: Magrinat, Valentino Hue, MD  DIAGNOSIS: ypTX, ypN2a, ypMX Right Breast Cancer, high grade, triple negative Clinical T4N1-2Mx (Stage III)  HISTORY OF PRESENT ILLNESS::Janice Powell is a 67 y.o. female who presented with a palpable right breast mass. It took a couple years for her to bring this to a physician's attention.  Diagnostic mammography and right ultrasonography at [facility] 03/02/2012 showed a 6.78 cm irregular mass in the inner aspect of the right breast. There was nipple retraction and erythema around the nipple. The largest lymph node was noted in the right axilla. By ultrasound the large irregular hypoechoic mass was noted in the breast with multiple enlarged axillary lymph nodes. Physical exam confirmed a hard breast with erythema around the right nipple extending inferiorly. The nipple was slightly inverted.  Biopsy of this mass was obtained 03/08/2012 and showed a high-grade triple negative breast cancer.  Axillary node was also + by bx:  1. Breast, right, needle core biopsy, mass, 4-5 o'clock - INVASIVE DUCTAL CARCINOMA. - SEE COMMENT. 2. Lymph node, needle/core biopsy, right axilla - METASTATIC CARCINOMA IN 1 OF 1 LYMPH NODE (1/1). - SEE COMMENT.  She initially refused MRI of her breasts.  She did not undergo systemic imaging - I am not sure if she refused it as well - but I have spoken with medical oncology and the plan is to perform systemic imaging for surveillance in ~ a few months. She started neoadjuvant chemotherapy in January 2014..  Mid therapy, an MRI in 10-24-12 showed a 7.5 cm breast mass.  In June, at the end of neoadjuvant chemotherapy, MRI showed complete CR.  Path on 8-18 after modified radical mastectomy  revealed that below.  She still has her drains in, and reports a little tightness in her right shoulder.  Diagnosis Breast, modified radical mastectomy , right - FIBROSIS WITH PATCHY CHRONIC INFLAMMATION. - FIBROCYSTIC CHANGES WITH CALCIFICATIONS. - NO RESIDUAL CARCINOMA IDENTIFIED WITHIN MASTECTOMY. - METASTATIC CARCINOMA IN NINE OF NINE LYMPH NODES (9/9) Microscopic Comment BREAST, INVASIVE TUMOR, WITH LYMPH NODE SAMPLING Specimen, including laterality: Right breast. Procedure: Mastectomy. Grade: III, see previous core biopsy pathology report, [id number] Tubule formation: See [id number] Nuclear pleomorphism: See [id number] Mitotic:See [phone] Tumor size (gross measurement or glass slide measurement): No residual carcinoma identified in mastectomy. Margins: Free of tumor. Invasive, distance to closest margin: N/A In-situ, distance to closest margin: N/A If margin positive, focally or broadly: N/A Lymphovascular invasion: No. Ductal carcinoma in situ: No residual DCIS identified. Grade: N/A Extensive intraductal component: N/A Lobular neoplasia: No. Tumor focality: No residual carcinoma identified. Treatment effect: Yes If present, treatment effect in breast tissue, lymph nodes or both: Breast tissue. Extent of tumor: Skin: Free of tumor. Nipple: Free of tumor Skeletal muscle: N/A Lymph nodes: # examined: 9 Lymph nodes with metastasis: 9 Isolated tumor cells (< 0.2 mm): N/A Micrometastasis: (> 0.2 mm and < 2.0 mm): 0 Macrometastasis: (> 2.0 mm): 9 Extracapsular extension: Not identified. Breast prognostic profile: [id number] Estrogen receptor: 0%, negative. Progesterone receptor: 0%, negative. Her 2 neu: No amplification, ratio 1.42. Ki-67: 93% Non-neoplastic breast: Fibrocystic changes with calcifications. TNM: ypTX, ypN2a, ypMX Comments: There is a 6.8 cm area in the central to inferior medial portion of the specimen which shows dense fibrous tissue with  patchy foci of chronic inflammation consistent with treatment effect. Residual carcinoma within the breast parenchyma is not identified. There are nine axillary lymph nodes isolated and all contain metastatic carcinoma.  PREVIOUS RADIATION THERAPY: No  PAST MEDICAL HISTORY:  has a past medical history of Hypertension; Pneumonia; GERD (gastroesophageal reflux disease); Osteopenia; and Breast cancer (03/08/13).    PAST SURGICAL HISTORY: Past Surgical History  Procedure Laterality Date  . Appendectomy  1992  . Tubal ligation  1979  . Cholecystectomy  2000  . Portacath placement  08/15/2012    Procedure: INSERTION PORT-A-CATH;  Surgeon: Ernestene Mention, MD;  Location: [facility] OR;  Service: General;  Laterality: Left;  Marland Kitchen Mastectomy Right 03/13/2013  . Breast biopsy Right   . Port-a-cath removal  03/13/2013  . Mastectomy modified radical Right 03/13/2013    Procedure: MASTECTOMY MODIFIED RADICAL;  Surgeon: Ernestene Mention, MD;  Location: [facility] OR;  Service: General;  Laterality: Right;  . Port-a-cath removal Left 03/13/2013    Procedure: REMOVAL PORT-A-CATH;  Surgeon: Ernestene Mention, MD;  Location: [facility] OR;  Service: General;  Laterality: Left;    FAMILY HISTORY: family history includes Heart disease in her mother.  SOCIAL HISTORY:  reports that she has never smoked. She has never used smokeless tobacco. She reports that she does not drink alcohol or use illicit drugs.  ALLERGIES: Review of patient's allergies indicates no known allergies.  MEDICATIONS:  Current Outpatient Prescriptions  Medication Sig Dispense Refill  . HYDROcodone-acetaminophen (NORCO/VICODIN) 5-325 MG per tablet Take 1-2 tablets by mouth every 4 (four) hours as needed for pain.  35 tablet  1  . lisinopril (PRINIVIL) 10 MG tablet Take 1 tablet (10 mg total) by mouth daily.  30 tablet  12  . metoprolol succinate (TOPROL XL) 50 MG 24 hr tablet Take 1 tablet (50 mg total) by mouth daily. Take with or immediately following a meal.  90  tablet  0  . Naproxen Sodium (ALEVE) 220 MG CAPS Take 220 mg by mouth 2 (two) times daily as needed (for pain).       No current facility-administered medications for this encounter.    REVIEW OF SYSTEMS: as above   PHYSICAL EXAM:  height is 5' 3.5" (1.613 m) and weight is 138 lb 1.6 oz (62.642 kg). Her temperature is 98.1 F (36.7 C). Her blood pressure is 190/98 and her pulse is 90. Her oxygen saturation is 99%.    General: Alert and oriented, in no acute distress HEENT: Head is normocephalic. Wearing a wig. Neck: Neck is supple, no palpable cervical or supraclavicular lymphadenopathy. Heart: Regular in rate and rhythm with no murmurs, rubs, or gallops. Chest: Clear to auscultation bilaterally, with no rhonchi, wheezes, or rales. Abdomen: Soft, nontender, nondistended, with no rigidity or guarding. Extremities: No arm edema.  L>R ankle edema (pt says this is chronic) with no calf tenderness. Lymphatics: No concerning lymphadenopathy. Skin: No concerning lesions. Musculoskeletal: symmetric strength and muscle tone throughout. Right arm - decent ROM but not full ROM in R shoulder. Neurologic: Cranial nerves II through XII are grossly intact. No obvious focalities. Speech is fluent. Coordination is intact. Psychiatric: Judgment and insight are intact. Affect is appropriate. Breasts/chest wall: Right chest wall healing with drains intact. Left breast - no palpable lesions appreciated or axillary adenopathy   LABORATORY DATA:  Lab Results  Component Value Date   WBC 5.7 03/02/2013   HGB 13.9 03/02/2013   HCT 41.3 03/02/2013   MCV 98.6 03/02/2013   PLT 226  03/02/2013   CMP     Component Value Date/Time   NA 141 03/02/2013 1230   NA 143 01/19/2013 1154   K 3.8 03/02/2013 1230   K 3.6 01/19/2013 1154   CL 104 03/02/2013 1230   CL 107 01/11/2013 0853   CO2 24 03/02/2013 1230   CO2 26 01/19/2013 1154   GLUCOSE 92 03/02/2013 1230   GLUCOSE 89 01/19/2013 1154   GLUCOSE 123* 01/11/2013 0853   BUN 11  03/02/2013 1230   BUN 10.9 01/19/2013 1154   CREATININE 0.71 03/02/2013 1230   CREATININE 0.7 01/19/2013 1154   CALCIUM 9.3 03/02/2013 1230   CALCIUM 8.8 01/19/2013 1154   PROT 7.1 03/02/2013 1230   PROT 6.3* 01/19/2013 1154   ALBUMIN 3.9 03/02/2013 1230   ALBUMIN 3.5 01/19/2013 1154   AST 30 03/02/2013 1230   AST 20 01/19/2013 1154   ALT 16 03/02/2013 1230   ALT 22 01/19/2013 1154   ALKPHOS 78 03/02/2013 1230   ALKPHOS 72 01/19/2013 1154   BILITOT 0.6 03/02/2013 1230   BILITOT 0.24 01/19/2013 1154   GFRNONAA 88* 03/02/2013 1230   GFRAA >90 03/02/2013 1230       RADIOGRAPHY: No results found.    IMPRESSION/PLAN: It was a pleasure meeting the patient today. We discussed the risks, benefits, and side effects of radiotherapy. Treating the chest wall and regional nodes should improve her overall survival  by an absolute ~10% and decrease risk of local regional recurrence by ~2/3 per randomized data. We discussed that radiation would take approximately 6 weeks to complete and that I would give the patient a couple more weeks  to heal following surgery before starting treatment planning.  If her drains aren't removed by 9-16, she will call to reschedule her RT simulation. We spoke about acute effects including skin irritation and fatigue and lymphedema as well as much less common late effects including lung  irritation. We spoke about the latest technology that is used to minimize the risk of late effects for breast cancer patients undergoing radiotherapy. No guarantees of treatment were given. Consent signed today.  The patient is enthusiastic about proceeding with treatment. I look forward to participating in the patient's care.  I will refer her to PT for ROM in arm.  Patient advised to take BP meds as prescribed.  Her BP was high today - but she is asymptomatic. No HAs or visual changes.  She did not take her BP meds this AM.  I spent 35 minutes  face to face with the patient and more than 50% of that time was spent  in counseling and/or coordination of care.    __________________________________________   Lonie Peak, MD

## 2013-03-31 ENCOUNTER — Ambulatory Visit (INDEPENDENT_AMBULATORY_CARE_PROVIDER_SITE_OTHER): Payer: Medicare Other | Admitting: General Surgery

## 2013-03-31 ENCOUNTER — Encounter: Payer: Self-pay | Admitting: Radiation Oncology

## 2013-03-31 ENCOUNTER — Encounter (INDEPENDENT_AMBULATORY_CARE_PROVIDER_SITE_OTHER): Payer: Self-pay | Admitting: General Surgery

## 2013-03-31 ENCOUNTER — Other Ambulatory Visit (HOSPITAL_BASED_OUTPATIENT_CLINIC_OR_DEPARTMENT_OTHER): Payer: Medicare Other | Admitting: Lab

## 2013-03-31 ENCOUNTER — Ambulatory Visit (HOSPITAL_BASED_OUTPATIENT_CLINIC_OR_DEPARTMENT_OTHER): Payer: Medicare Other | Admitting: Oncology

## 2013-03-31 ENCOUNTER — Telehealth: Payer: Self-pay | Admitting: Oncology

## 2013-03-31 VITALS — BP 160/102 | HR 84 | Temp 98.5°F | Resp 14 | Ht 63.5 in | Wt 140.2 lb

## 2013-03-31 VITALS — BP 200/79 | HR 67 | Temp 98.3°F | Resp 20 | Ht 63.5 in | Wt 139.2 lb

## 2013-03-31 DIAGNOSIS — C50219 Malignant neoplasm of upper-inner quadrant of unspecified female breast: Secondary | ICD-10-CM

## 2013-03-31 DIAGNOSIS — C773 Secondary and unspecified malignant neoplasm of axilla and upper limb lymph nodes: Secondary | ICD-10-CM

## 2013-03-31 DIAGNOSIS — Z171 Estrogen receptor negative status [ER-]: Secondary | ICD-10-CM

## 2013-03-31 DIAGNOSIS — C50919 Malignant neoplasm of unspecified site of unspecified female breast: Secondary | ICD-10-CM

## 2013-03-31 DIAGNOSIS — E876 Hypokalemia: Secondary | ICD-10-CM

## 2013-03-31 DIAGNOSIS — C50411 Malignant neoplasm of upper-outer quadrant of right female breast: Secondary | ICD-10-CM

## 2013-03-31 DIAGNOSIS — I10 Essential (primary) hypertension: Secondary | ICD-10-CM

## 2013-03-31 DIAGNOSIS — C50911 Malignant neoplasm of unspecified site of right female breast: Secondary | ICD-10-CM

## 2013-03-31 DIAGNOSIS — C50419 Malignant neoplasm of upper-outer quadrant of unspecified female breast: Secondary | ICD-10-CM

## 2013-03-31 LAB — CBC WITH DIFFERENTIAL/PLATELET
BASO%: 0.9 % (ref 0.0–2.0)
EOS%: 3.2 % (ref 0.0–7.0)
HCT: 35.2 % (ref 34.8–46.6)
LYMPH%: 27.1 % (ref 14.0–49.7)
MCH: 33 pg (ref 25.1–34.0)
MCHC: 33.2 g/dL (ref 31.5–36.0)
NEUT%: 59.3 % (ref 38.4–76.8)
RBC: 3.54 10*6/uL — ABNORMAL LOW (ref 3.70–5.45)
WBC: 4.4 10*3/uL (ref 3.9–10.3)
lymph#: 1.2 10*3/uL (ref 0.9–3.3)

## 2013-03-31 LAB — COMPREHENSIVE METABOLIC PANEL (CC13)
ALT: 15 U/L (ref 0–55)
AST: 17 U/L (ref 5–34)
Chloride: 106 mEq/L (ref 98–109)
Creatinine: 0.7 mg/dL (ref 0.6–1.1)
Sodium: 142 mEq/L (ref 136–145)
Total Bilirubin: 0.34 mg/dL (ref 0.20–1.20)
Total Protein: 6.7 g/dL (ref 6.4–8.3)

## 2013-03-31 NOTE — Progress Notes (Signed)
Patient ID: Janice Powell, female   DOB: 1945-08-13, 67 y.o.   MRN: [id number] ID: Janice Powell   DOB: [birth year]  MR#: [id number]  CSN#:[id number]  PCP: Londell Moh, MD GYN:  SUClaud Kelp MD OTHER MD: Lonie Peak   HISTORY OF PRESENT ILLNESS: Janice Powell noted a mass in her right breast sometime in 2011. 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 More recently her husband twisted her arm to get the mass looked at, and she brought it to Dr. Carolee Rota attention. Diagnostic mammography and right ultrasonography at [facility] 03/02/2012 showed a 6.78 cm irregular mass in the inner aspect of the right breast. There was nipple retraction and erythema around the nipple. The largest lymph node was noted in the right axilla. By ultrasound the large irregular hypoechoic mass was noted in the breast with multiple enlarged axillary lymph nodes. Physical exam confirmed a hard breast with erythema around the right nipple extending inferiorly. The nipple is slightly inverted.  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 Her subsequent history is as detailed below  INTERVAL HISTORY: Janice Powell returns today for follow up of her breast cancer. Since her last visit here she had her definitive surgery, which showed a complete response in the breast, but 9 out of 9 axillary lymph nodes involved with micrometastatic deposit.  REVIEW OF SYSTEMS: Janice Powell did well with the surgery, with the initial pain well-controlled on her mild narcotics. She did get constipated from this, but no study use laxatives to keep her self regular. She had no bleeding or fever issues. She is functioning "about as normal as usual". A detailed review of systems today was otherwise noncontributory  PAST MEDICAL HISTORY: Past Medical History  Diagnosis Date   Hypertension    Pneumonia     hx   GERD  (gastroesophageal reflux disease)     occ   Osteopenia    Breast cancer 03/08/13    right breast - Invasive Ductal Carcinoma   GERD osteopenia  PAST SURGICAL HISTORY: Past Surgical History  Procedure Laterality Date   Appendectomy  1992   Tubal ligation  1979   Cholecystectomy  2000   Portacath placement  08/15/2012    Procedure: INSERTION PORT-A-CATH;  Surgeon: Ernestene Mention, MD;  Location: [facility] OR;  Service: General;  Laterality: Left;   Mastectomy Right 03/13/2013   Breast biopsy Right    Port-a-cath removal  03/13/2013   Mastectomy modified radical Right 03/13/2013    Procedure: MASTECTOMY MODIFIED RADICAL;  Surgeon: Ernestene Mention, MD;  Location: [facility] OR;  Service: General;  Laterality: Right;   Port-a-cath removal Left 03/13/2013    Procedure: REMOVAL PORT-A-CATH;  Surgeon: Ernestene Mention, MD;  Location: [facility] OR;  Service: General;  Laterality: Left;    FAMILY HISTORY Family History  Problem Relation Age of Onset   Heart disease Mother    the patient's father died in his sleep at age 22. The patient's mother died at the age of 34 from a myocardial infarction. The patient has one brother and 2 sisters, all surviving. There is no history of breast or ovarian cancer in the family.  GYNECOLOGIC HISTORY: Menarche age 72, first live birth age 25, she is GX P5, menopause age 33. She did not take hormone replacement.  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 She  is a Optician, dispensing. Her husband [name] worked in the post office 41 years, and is also now retired. He is a former Arts development officer. Son [name] lives in [place] and manages an [facility] store. Daughter Teeghan Hammer also lives in [place] and manages the IT Department at [employer]. Son Elienai Gailey III is a Therapist, sports. Daughter [name] is a Doctor, hospital for [employer]. Son [name] is an Teaching laboratory technician. The patient has 8 grandchildren. She attends the white [employer]   ADVANCED DIRECTIVES: Not in place  HEALTH MAINTENANCE: History  Substance Use Topics   Smoking status: Never Smoker    Smokeless tobacco: Never Used   Alcohol Use: No     Colonoscopy:  PAP:  Bone density:  January 2011  Lipid panel:  No Known Allergies  Current Outpatient Prescriptions  Medication Sig Dispense Refill   HYDROcodone-acetaminophen (NORCO/VICODIN) 5-325 MG per tablet Take 1-2 tablets by mouth every 4 (four) hours as needed for pain.  35 tablet  1   lisinopril (PRINIVIL) 10 MG tablet Take 1 tablet (10 mg total) by mouth daily.  30 tablet  12   metoprolol succinate (TOPROL XL) 50 MG 24 hr tablet Take 1 tablet (50 mg total) by mouth daily. Take with or immediately following a meal.  90 tablet  0   Naproxen Sodium (ALEVE) 220 MG CAPS Take 220 mg by mouth 2 (two) times daily as needed (for pain).       No current facility-administered medications for this visit.    OBJECTIVE: Middle-aged [place] American woman in no acute distress Filed Vitals:   03/31/13 0916  BP: 200/79  Pulse: 67  Temp: 98.3 F (36.8 C)  Resp: 20     Body mass index is 24.27 kg/(m^2).    ECOG FS: 1  Filed Weights   03/31/13 0916  Weight: 63.141 kg (139 lb 3.2 oz)   Sclerae unicteric, pupils equal round and reactive Oropharynx clear No cervical or supraclavicular adenopathy  Lungs clear to auscultation with good excursion bilaterally Heart regular rate and rhythm, 1/6 systolic murmur as previously noted Abdomen  soft, nontender, positive bowel sounds MSK no focal spinal tenderness  No peripheral edema and specifically no evidence of lymphedema in the right upper extremity Neuro: nonfocal, well oriented, pleasant affect Breasts:  The right breast is status post recent mastectomy. The incision is healing very nicely. 2 drains are still in place with approximately 15 cc of serosanguineous fluid in each bulb. There is no evidence of dehiscence, erythema, or swelling. Careful  palpation of the right axilla is entirely negative. The left breast is unremarkable.  LAB RESULTS: CBC    Component Value Date/Time   WBC 4.4 03/31/2013 0902   WBC 5.7 03/02/2013 1230   RBC 3.54* 03/31/2013 0902   RBC 4.19 03/02/2013 1230   HGB 11.7 03/31/2013 0902   HGB 13.9 03/02/2013 1230   HCT 35.2 03/31/2013 0902   HCT 41.3 03/02/2013 1230   PLT 303 03/31/2013 0902   PLT 226 03/02/2013 1230   MCV 99.5 03/31/2013 0902   MCV 98.6 03/02/2013 1230   MCH 33.0 03/31/2013 0902   MCH 33.2 03/02/2013 1230   MCHC 33.2 03/31/2013 0902   MCHC 33.7 03/02/2013 1230   RDW 14.7* 03/31/2013 0902   RDW 14.0 03/02/2013 1230   LYMPHSABS 1.2 03/31/2013 0902   LYMPHSABS 1.7 03/02/2013 1230   MONOABS 0.4 03/31/2013 0902   MONOABS 0.7 03/02/2013 1230   EOSABS 0.1 03/31/2013 0902   EOSABS 0.1 03/02/2013  1230   BASOSABS 0.0 03/31/2013 0902   BASOSABS 0.0 03/02/2013 1230    CMP     Component Value Date/Time   NA 142 03/31/2013 0902   NA 141 03/02/2013 1230   K 3.6 03/31/2013 0902   K 3.8 03/02/2013 1230   CL 104 03/02/2013 1230   CL 107 01/11/2013 0853   CO2 27 03/31/2013 0902   CO2 24 03/02/2013 1230   GLUCOSE 101 03/31/2013 0902   GLUCOSE 92 03/02/2013 1230   GLUCOSE 123* 01/11/2013 0853   BUN 9.2 03/31/2013 0902   BUN 11 03/02/2013 1230   CREATININE 0.7 03/31/2013 0902   CREATININE 0.71 03/02/2013 1230   CALCIUM 9.0 03/31/2013 0902   CALCIUM 9.3 03/02/2013 1230   PROT 6.7 03/31/2013 0902   PROT 7.1 03/02/2013 1230   ALBUMIN 3.6 03/31/2013 0902   ALBUMIN 3.9 03/02/2013 1230   AST 17 03/31/2013 0902   AST 30 03/02/2013 1230   ALT 15 03/31/2013 0902   ALT 16 03/02/2013 1230   ALKPHOS 67 03/31/2013 0902   ALKPHOS 78 03/02/2013 1230   BILITOT 0.34 03/31/2013 0902   BILITOT 0.6 03/02/2013 1230   GFRNONAA 88* 03/02/2013 1230   GFRAA >90 03/02/2013 1230       STUDIES: Patient: MIRINDA, MONTE Collected: 03/13/2013 Client: [facility] Accession: [id number] Received: 03/13/2013 Janice Powell DOB: June 10, 1946 Age: 31 Gender: F Reported: 03/15/2013 [address] Patient Ph: [phone] MRN #: [id number] [place], [place] [address] Visit #: [id number] Chart #: Phone:  Fax: CC: Marianne Sofia, RN [place] [facility] REPORT OF SURGICAL PATHOLOGY FINAL DIAGNOSIS Diagnosis Breast, modified radical mastectomy , right - FIBROSIS WITH PATCHY CHRONIC INFLAMMATION. - FIBROCYSTIC CHANGES WITH CALCIFICATIONS. - NO RESIDUAL CARCINOMA IDENTIFIED WITHIN MASTECTOMY. - METASTATIC CARCINOMA IN NINE OF NINE LYMPH NODES (9/9) Microscopic Comment BREAST, INVASIVE TUMOR, WITH LYMPH NODE SAMPLING Specimen, including laterality: Right breast. Procedure: Mastectomy. Grade: III, see previous core biopsy pathology report, [id number] Tubule formation: See [id number] Nuclear pleomorphism: See [id number] Mitotic:See [phone] Tumor size (gross measurement or glass slide measurement): No residual carcinoma identified in mastectomy. Margins: Free of tumor. Invasive, distance to closest margin: N/A In-situ, distance to closest margin: N/A If margin positive, focally or broadly: N/A Lymphovascular invasion: No. Ductal carcinoma in situ: No residual DCIS identified. Grade: N/A Extensive intraductal component: N/A Lobular neoplasia: No. Tumor focality: No residual carcinoma identified. Treatment effect: Yes If present, treatment effect in breast tissue, lymph nodes or both: Breast tissue. Extent of tumor: Skin: Free of tumor. Nipple: Free of tumor Skeletal muscle: N/A Lymph nodes: 1 of 3 FINAL for KINSLY, HILD [phone]) Microscopic Comment(continued) # examined: 9 Lymph nodes with metastasis: 9 Isolated tumor cells (< 0.2 mm): N/A Micrometastasis: (> 0.2 mm and < 2.0 mm): 0 Macrometastasis: (> 2.0 mm): 9 Extracapsular extension: Not identified. Breast prognostic profile: [id number] Estrogen receptor: 0%, negative. Progesterone receptor: 0%, negative. Her 2 neu: No amplification, ratio 1.42. Ki-67: 93% Non-neoplastic breast:  Fibrocystic changes with calcifications. TNM: ypTX, ypN2a, ypMX Comments: There is a 6.8 cm area in the central to inferior medial portion of the specimen which shows dense fibrous tissue with patchy foci of chronic inflammation consistent with treatment effect. Residual carcinoma within the breast parenchyma is not identified. There are nine axillary lymph nodes isolated and all contain metastatic carcinoma. (JDP:gt, 03/15/13) Jimmy Picket MD Pathologist, Electronic Signature (Case signed 03/15/2013)  ASSESSMENT: 67 y.o. [place] woman   (1)  s/p Right breast upper inner quadrant and  axillary node biopsy 03/08/2012 for a clinical T4, N1-2', stage IIIB invasive ductal carcinoma, grade 3, triple negative, with an MIB-1 of 93%.  (2) definitive staging and treatment delayed as patient canceled staging studies and tried alternative treatments; with eventual progression  (3) neoadjuvant chemotherapy started 08/23/12  (a) received two cycles of neoadjuvant cyclophosphamide, docetaxel, doxorubicin ["TAC"] at standard doses with neulasta support; refused neulasta with cycles 2 and 4; tolerated treatment poorly  (b) docetaxel was held for final 4 cycles; completed cycle 6 neoadjuvant chemotherapy 12/27/2012    (4) status post right modified radical mastectomy 03/14/2023 showing no residual carcinoma in the breast, but nine of 9 axillary lymph nodes sampled involved with macro metastases (ypTX, ypN2, stage IIIA)  (5) I have requested a repeat prognostic panel on the mastectomy tissue, and this is pending  (6) radiation therapy under Dr. Basilio Cairo to start mid-September  PLAN: Marna had a very good response in the breast, less so in the lymph nodes. This may be because of differential blood flow to these areas. Given the good result in the breast, I am hopeful microscopic occult disease elsewhere may have been sterilized. I have discussed these results with her radiation oncologist, Dr. Basilio Cairo, and  we agree that further surgery likely would only increase the risk of major morbidity (significant lymphedema) without improving survival. The patient has already been scheduled to start her radiation treatments 04/11/2013. All this was discussed with Oniyah today.  The patient initially refused staging studies. I am going to wait until after her radiation changes have "settled down" to re-stage her with a CT of the chest and a PET scan. This will be sometime in November. She agrees to these studies, which will serve as our new baseline.  At this point she is not interested in reconstruction.  Jalecia's blood pressure was very high today. She tells me she forgot to take her blood pressure medication. She understands the risk of stroke and kidney damage as well as possible heart problems related to uncontrolled hypertension.  She knows to call for any problems that may develop before her next visit here.  Mancil Pfenning C    03/31/2013

## 2013-03-31 NOTE — Progress Notes (Signed)
Patient ID: Janice Powell, female   DOB: 1945/12/27, 67 y.o.   MRN: [id number] History: This patient underwent right modified radical mastectomy and removal of Port-A-Cath on 03/13/2013. Final pathology report showed complete pathologic response in the breast, however 9 out of 9 lymph nodes were positive for metastatic disease. She feels well. The drains are less than 20 cc per day on average. Minimal pain. She is scheduled to begin radiation therapy with Dr. Basilio Cairo on September 16th  This patient noticed a right breast mass in 2011. Diagnostic mammography and right ultrasonography at [facility] 03/02/2012 showed a 6.7 cm irregular mass in the inner aspect of the right breast. There was nipple retraction and erythema around the nipple. A large lymph node was noted in the right axilla. By ultrasound the large irregular hypoechoic mass was noted in the breast with multiple enlarged axillary lymph nodes. Physical exam confirmed a hard breast with erythema around the right nipple extending inferiorly.. 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  She initially declined chemotherapy, but reconsidered and I placed a Port-A-Cath on 08/15/2012.  She received neoadjuvant chemotherapy..  MRI performed on 01/19/2013 showed good response in the right breast with minimal enhancement. The level I lymph nodes are much smaller and are basically normal. Otherwise there are no abnormal findings on MRI.  Final pathology report shows a complete pathologic response in the breast, but 9 out of 9 axillary nodes are positive for metastatic disease.   Exam: Patient looks well. No distress Right mastectomy wound looks good. Skin is healthy. Skin flaps are stuck down very well. No sign of infection or fluid. Drains are removed. Range of motion right shoulder reveals that she can abduct the right shoulder about 140.  Assessment: Locally advanced  cancer right breast, initial clinical stage T4, N2, stage IIIB, triple negative breast cancer Status post neoadjuvant chemotherapy initiated 08/23/2012 Status post right modified radical mastectomy and removal of Port-A-Cath on 03/13/2013 revealing no residual carcinoma in the breast but 9/9  lymph node sampled involving macro metastasis,   ypTX, ypN2, stage IIIA   Plan: Wound care discussed Prescription for postmastectomy bra and prosthesis given Referred to physical therapy The patient to shower Return to see me in one month, sooner if there are any wound problems. Anticipate initiation of radiation therapy later this month.    Angelia Mould. Derrell Lolling, M.D., [facility] General and Minimally invasive Surgery Breast and Colorectal Surgery Office:   [phone] Pager:   [phone]

## 2013-03-31 NOTE — Patient Instructions (Signed)
Your right mastectomy wound is healing nicely, and we removed both of the drains today.  You may start taking a shower tomorrow.  You have been given a prescription for post mastectomy bra and prosthesis, and you can go to be fitted for that at any time.  You'll be referred to physical therapy for range of motion exercises to the right shoulder.  Return to see Dr. Derrell Lolling in one month, sooner if there are any problems with wound healing.

## 2013-04-04 ENCOUNTER — Other Ambulatory Visit (INDEPENDENT_AMBULATORY_CARE_PROVIDER_SITE_OTHER): Payer: Self-pay | Admitting: *Deleted

## 2013-04-04 ENCOUNTER — Telehealth: Payer: Self-pay | Admitting: *Deleted

## 2013-04-04 DIAGNOSIS — C50919 Malignant neoplasm of unspecified site of unspecified female breast: Secondary | ICD-10-CM

## 2013-04-04 MED ORDER — UNABLE TO FIND: Status: DC

## 2013-04-04 NOTE — Telephone Encounter (Signed)
CALLED [facility] TO ARRANGE PT FOR THIS PATIENT, SPOKE WITH SCHEDULER AND SHE SAID THAT SHE WOULD HAVE SOMEONE GIVE ME A CALL AND WORK HER IN THIS WEEK

## 2013-04-11 ENCOUNTER — Ambulatory Visit
Admission: RE | Admit: 2013-04-11 | Discharge: 2013-04-11 | Disposition: A | Discharge status: Home / Self Care | Payer: Medicare Other | Source: Ambulatory Visit | Attending: Radiation Oncology | Admitting: Radiation Oncology

## 2013-04-11 ENCOUNTER — Telehealth (INDEPENDENT_AMBULATORY_CARE_PROVIDER_SITE_OTHER): Payer: Self-pay

## 2013-04-11 DIAGNOSIS — Z510 Encounter for antineoplastic radiation therapy: Secondary | ICD-10-CM | POA: Insufficient documentation

## 2013-04-11 DIAGNOSIS — C773 Secondary and unspecified malignant neoplasm of axilla and upper limb lymph nodes: Secondary | ICD-10-CM | POA: Diagnosis not present

## 2013-04-11 DIAGNOSIS — C50919 Malignant neoplasm of unspecified site of unspecified female breast: Secondary | ICD-10-CM | POA: Insufficient documentation

## 2013-04-11 DIAGNOSIS — C50411 Malignant neoplasm of upper-outer quadrant of right female breast: Secondary | ICD-10-CM

## 2013-04-11 DIAGNOSIS — C50419 Malignant neoplasm of upper-outer quadrant of unspecified female breast: Secondary | ICD-10-CM | POA: Diagnosis not present

## 2013-04-11 NOTE — Telephone Encounter (Signed)
Patient calling into office requesting a refill on her Hydrocodone 5/325mg .  Patient reports having pain that seems to be well controlled by the hydrocodone.  Patient denies having any fevers, nausea, vomiting swelling or redness.  Patient s/p Right Modified Radical mastectomy & PAC Removal on 03/13/13.  Pain medication refill protocol [facility] [address] [phone] (Hydrocodone 5/325mg , take 1 po, q6hrs, prn pain, #30, w/Refills)

## 2013-04-11 NOTE — Progress Notes (Signed)
  Radiation Oncology         [phone] ________________________________  Name: Janice Powell MRN: [id number]  Date: 04/11/2013  DOB: Mar 12, 1946  SIMULATION AND TREATMENT PLANNING NOTE  Outpatient  DIAGNOSIS:  Right breast cancer  NARRATIVE:  The patient was brought to the CT Simulation planning suite.  Identity was confirmed.  All relevant records and images related to the planned course of therapy were reviewed.  The patient freely provided informed written consent to proceed with treatment after reviewing the details related to the planned course of therapy. The consent form was witnessed and verified by the simulation staff.    Then, the patient was set-up in a stable reproducible  supine position for radiation therapy.  CT images were obtained.  Surface markings were placed.  The CT images were loaded into the planning software.    TREATMENT PLANNING NOTE: Treatment planning then occurred.  The radiation prescription was entered and confirmed.    A total of 5 medically necessary complex treatment devices were fabricated and supervised by me - 4 fields with MLCs and accuform for head. I have requested : 3D Simulation  I have requested a DVH of the following structures: esophagus, lungs, cord.    I have prescribed  1) Right Chest Wall and internal mammary nodes to be treated with 2 tangents using MLCs to block lung/heart/ 50 Gy in  25 fractions 2) Right Supraclavicular fossa with an LAO field using MLCs to block cord, esophagus / 50Gy in 25 fractions 3) Right Posterior Axillary boost with a PA field using MLCs to block lung / to boost axilla to 50Gy   eventually she will be planned for a Right Chest Wall Scar boost with electrons / 10 Gy in 5 fractions     -----------------------------------  Lonie Peak, MD

## 2013-04-12 ENCOUNTER — Ambulatory Visit: Payer: Medicare Other | Attending: Radiation Oncology | Admitting: Physical Therapy

## 2013-04-12 DIAGNOSIS — IMO0001 Reserved for inherently not codable concepts without codable children: Secondary | ICD-10-CM | POA: Diagnosis not present

## 2013-04-12 DIAGNOSIS — M24519 Contracture, unspecified shoulder: Secondary | ICD-10-CM | POA: Insufficient documentation

## 2013-04-12 DIAGNOSIS — M25519 Pain in unspecified shoulder: Secondary | ICD-10-CM | POA: Diagnosis not present

## 2013-04-12 DIAGNOSIS — Z9010 Acquired absence of unspecified breast and nipple: Secondary | ICD-10-CM | POA: Diagnosis not present

## 2013-04-12 DIAGNOSIS — C50919 Malignant neoplasm of unspecified site of unspecified female breast: Secondary | ICD-10-CM | POA: Insufficient documentation

## 2013-04-13 ENCOUNTER — Ambulatory Visit: Payer: Medicare Other | Admitting: Physical Therapy

## 2013-04-13 DIAGNOSIS — C50919 Malignant neoplasm of unspecified site of unspecified female breast: Secondary | ICD-10-CM | POA: Diagnosis not present

## 2013-04-13 DIAGNOSIS — M24519 Contracture, unspecified shoulder: Secondary | ICD-10-CM | POA: Diagnosis not present

## 2013-04-13 DIAGNOSIS — IMO0001 Reserved for inherently not codable concepts without codable children: Secondary | ICD-10-CM | POA: Diagnosis not present

## 2013-04-13 DIAGNOSIS — M25519 Pain in unspecified shoulder: Secondary | ICD-10-CM | POA: Diagnosis not present

## 2013-04-13 DIAGNOSIS — Z9010 Acquired absence of unspecified breast and nipple: Secondary | ICD-10-CM | POA: Diagnosis not present

## 2013-04-14 NOTE — Addendum Note (Signed)
Encounter addended by: Clarisa Danser Mintz Gael Delude, RN on: 04/14/2013 10:18 AM<BR>     Documentation filed: Charges VN

## 2013-04-17 DIAGNOSIS — Z510 Encounter for antineoplastic radiation therapy: Secondary | ICD-10-CM | POA: Diagnosis not present

## 2013-04-17 DIAGNOSIS — C50419 Malignant neoplasm of upper-outer quadrant of unspecified female breast: Secondary | ICD-10-CM | POA: Diagnosis not present

## 2013-04-17 DIAGNOSIS — C773 Secondary and unspecified malignant neoplasm of axilla and upper limb lymph nodes: Secondary | ICD-10-CM | POA: Diagnosis not present

## 2013-04-17 DIAGNOSIS — C50919 Malignant neoplasm of unspecified site of unspecified female breast: Secondary | ICD-10-CM | POA: Diagnosis not present

## 2013-04-18 ENCOUNTER — Ambulatory Visit
Admission: RE | Admit: 2013-04-18 | Discharge: 2013-04-18 | Disposition: A | Discharge status: Home / Self Care | Payer: Medicare Other | Source: Ambulatory Visit | Attending: Radiation Oncology | Admitting: Radiation Oncology

## 2013-04-18 ENCOUNTER — Encounter: Payer: Self-pay | Admitting: Radiation Oncology

## 2013-04-18 DIAGNOSIS — Z510 Encounter for antineoplastic radiation therapy: Secondary | ICD-10-CM | POA: Diagnosis not present

## 2013-04-18 DIAGNOSIS — C50419 Malignant neoplasm of upper-outer quadrant of unspecified female breast: Secondary | ICD-10-CM | POA: Diagnosis not present

## 2013-04-18 DIAGNOSIS — C773 Secondary and unspecified malignant neoplasm of axilla and upper limb lymph nodes: Secondary | ICD-10-CM | POA: Diagnosis not present

## 2013-04-18 DIAGNOSIS — C50919 Malignant neoplasm of unspecified site of unspecified female breast: Secondary | ICD-10-CM | POA: Diagnosis not present

## 2013-04-19 ENCOUNTER — Ambulatory Visit
Admission: RE | Admit: 2013-04-19 | Discharge: 2013-04-19 | Disposition: A | Discharge status: Home / Self Care | Payer: Medicare Other | Source: Ambulatory Visit | Attending: Radiation Oncology | Admitting: Radiation Oncology

## 2013-04-19 DIAGNOSIS — C50419 Malignant neoplasm of upper-outer quadrant of unspecified female breast: Secondary | ICD-10-CM | POA: Diagnosis not present

## 2013-04-19 DIAGNOSIS — C50919 Malignant neoplasm of unspecified site of unspecified female breast: Secondary | ICD-10-CM | POA: Diagnosis not present

## 2013-04-19 DIAGNOSIS — C773 Secondary and unspecified malignant neoplasm of axilla and upper limb lymph nodes: Secondary | ICD-10-CM | POA: Diagnosis not present

## 2013-04-19 DIAGNOSIS — Z510 Encounter for antineoplastic radiation therapy: Secondary | ICD-10-CM | POA: Diagnosis not present

## 2013-04-20 ENCOUNTER — Ambulatory Visit
Admission: RE | Admit: 2013-04-20 | Discharge: 2013-04-20 | Disposition: A | Discharge status: Home / Self Care | Payer: Medicare Other | Source: Ambulatory Visit | Attending: Radiation Oncology | Admitting: Radiation Oncology

## 2013-04-20 ENCOUNTER — Ambulatory Visit: Payer: Medicare Other | Admitting: Physical Therapy

## 2013-04-20 ENCOUNTER — Telehealth (INDEPENDENT_AMBULATORY_CARE_PROVIDER_SITE_OTHER): Payer: Self-pay

## 2013-04-20 DIAGNOSIS — M24519 Contracture, unspecified shoulder: Secondary | ICD-10-CM | POA: Diagnosis not present

## 2013-04-20 DIAGNOSIS — C50919 Malignant neoplasm of unspecified site of unspecified female breast: Secondary | ICD-10-CM | POA: Diagnosis not present

## 2013-04-20 DIAGNOSIS — C773 Secondary and unspecified malignant neoplasm of axilla and upper limb lymph nodes: Secondary | ICD-10-CM | POA: Diagnosis not present

## 2013-04-20 DIAGNOSIS — IMO0001 Reserved for inherently not codable concepts without codable children: Secondary | ICD-10-CM | POA: Diagnosis not present

## 2013-04-20 DIAGNOSIS — M25519 Pain in unspecified shoulder: Secondary | ICD-10-CM | POA: Diagnosis not present

## 2013-04-20 DIAGNOSIS — Z9011 Acquired absence of right breast and nipple: Secondary | ICD-10-CM

## 2013-04-20 DIAGNOSIS — Z510 Encounter for antineoplastic radiation therapy: Secondary | ICD-10-CM | POA: Diagnosis not present

## 2013-04-20 DIAGNOSIS — Z9010 Acquired absence of unspecified breast and nipple: Secondary | ICD-10-CM | POA: Diagnosis not present

## 2013-04-20 MED ORDER — TRAMADOL HCL 50 MG PO TABS: 50.0000 mg | ORAL_TABLET | Freq: Four times a day (QID) | ORAL | Status: DC | PRN

## 2013-04-20 NOTE — Telephone Encounter (Signed)
I called and spoke to the pt.  I told her Dr Derrell Lolling will not refill the Hydrocodone but I could call in Tramadol #20 with no refill.  It is not a narcotic.  I told her if she would rather, she can try Tylenol or Ibuprofen instead.  She said I can go ahead and call in Tramadol.  I called in Tramadol 50 mg one po q 6 hrs prn pain #20 [facility] [address]

## 2013-04-20 NOTE — Telephone Encounter (Signed)
Message copied by Ivory Broad on Thu Apr 20, 2013 10:53 AM ------      Message from: Isaias Sakai K      Created: Thu Apr 20, 2013 10:40 AM      Regarding: Dr Derrell Lolling      Contact: [phone]       Needs hydrocodon acetaminophen rx refilled. Call [facility] [address]. ------

## 2013-04-21 ENCOUNTER — Telehealth (INDEPENDENT_AMBULATORY_CARE_PROVIDER_SITE_OTHER): Payer: Self-pay | Admitting: General Surgery

## 2013-04-21 ENCOUNTER — Other Ambulatory Visit (INDEPENDENT_AMBULATORY_CARE_PROVIDER_SITE_OTHER): Payer: Self-pay | Admitting: *Deleted

## 2013-04-21 ENCOUNTER — Ambulatory Visit
Admission: RE | Admit: 2013-04-21 | Discharge: 2013-04-21 | Disposition: A | Discharge status: Home / Self Care | Payer: Medicare Other | Source: Ambulatory Visit | Attending: Radiation Oncology | Admitting: Radiation Oncology

## 2013-04-21 DIAGNOSIS — Z510 Encounter for antineoplastic radiation therapy: Secondary | ICD-10-CM | POA: Diagnosis not present

## 2013-04-21 DIAGNOSIS — C773 Secondary and unspecified malignant neoplasm of axilla and upper limb lymph nodes: Secondary | ICD-10-CM | POA: Diagnosis not present

## 2013-04-21 DIAGNOSIS — C50919 Malignant neoplasm of unspecified site of unspecified female breast: Secondary | ICD-10-CM | POA: Diagnosis not present

## 2013-04-21 MED ORDER — UNABLE TO FIND: Status: DC

## 2013-04-21 NOTE — Telephone Encounter (Signed)
Misty Stanley, [facility] [place], called for order for this pt.  Signed by Dr. Jamey Ripa (for Dr. Derrell Lolling) and Leandra Kern.

## 2013-04-24 ENCOUNTER — Encounter: Payer: Self-pay | Admitting: Radiation Oncology

## 2013-04-24 ENCOUNTER — Ambulatory Visit
Admission: RE | Admit: 2013-04-24 | Discharge: 2013-04-24 | Disposition: A | Discharge status: Home / Self Care | Payer: Medicare Other | Source: Ambulatory Visit | Attending: Radiation Oncology | Admitting: Radiation Oncology

## 2013-04-24 VITALS — BP 202/85 | HR 59 | Resp 16 | Wt 138.7 lb

## 2013-04-24 DIAGNOSIS — C773 Secondary and unspecified malignant neoplasm of axilla and upper limb lymph nodes: Secondary | ICD-10-CM | POA: Diagnosis not present

## 2013-04-24 DIAGNOSIS — C50919 Malignant neoplasm of unspecified site of unspecified female breast: Secondary | ICD-10-CM | POA: Diagnosis not present

## 2013-04-24 DIAGNOSIS — C50211 Malignant neoplasm of upper-inner quadrant of right female breast: Secondary | ICD-10-CM

## 2013-04-24 DIAGNOSIS — Z510 Encounter for antineoplastic radiation therapy: Secondary | ICD-10-CM | POA: Diagnosis not present

## 2013-04-24 DIAGNOSIS — C50411 Malignant neoplasm of upper-outer quadrant of right female breast: Secondary | ICD-10-CM

## 2013-04-24 NOTE — Progress Notes (Signed)
   Weekly Management Note:  outpatient Current Dose:  8 Gy  Projected Dose: 60 Gy   Narrative:  The patient presents for routine under treatment assessment.  CBCT/MVCT images/Port film x-rays were reviewed.  The chart was checked. No new complaints  Physical Findings:  weight is 138 lb 11.2 oz (62.914 kg). Her blood pressure is 202/85 and her pulse is 59. Her respiration is 16.  no skin changes over right chest wall.  Impression:  The patient is tolerating radiotherapy.  Plan:  Continue radiotherapy as planned.  Blood pressure remains high; she is asymptomatic.  She has been educated on the risks associated with this.   ________________________________   Lonie Peak, M.D.

## 2013-04-24 NOTE — Progress Notes (Addendum)
Patient denies skin changes within treatment field. Denies fatigue at this time. Has no complaints at this time. Provided patient with radiaplex, alra and Radiation Therapy and You handbook. Blood pressure elevated. Patient reports her bp has always run high. Reports that she has taken her two bp pills today. Denies having a headache or feeling dizzy.

## 2013-04-25 ENCOUNTER — Ambulatory Visit
Admission: RE | Admit: 2013-04-25 | Discharge: 2013-04-25 | Disposition: A | Discharge status: Home / Self Care | Payer: Medicare Other | Source: Ambulatory Visit | Attending: Radiation Oncology | Admitting: Radiation Oncology

## 2013-04-25 ENCOUNTER — Ambulatory Visit: Payer: Medicare Other | Admitting: Physical Therapy

## 2013-04-25 DIAGNOSIS — Z510 Encounter for antineoplastic radiation therapy: Secondary | ICD-10-CM | POA: Diagnosis not present

## 2013-04-25 DIAGNOSIS — C773 Secondary and unspecified malignant neoplasm of axilla and upper limb lymph nodes: Secondary | ICD-10-CM | POA: Diagnosis not present

## 2013-04-25 DIAGNOSIS — C50919 Malignant neoplasm of unspecified site of unspecified female breast: Secondary | ICD-10-CM | POA: Diagnosis not present

## 2013-04-26 ENCOUNTER — Ambulatory Visit
Admission: RE | Admit: 2013-04-26 | Discharge: 2013-04-26 | Disposition: A | Discharge status: Home / Self Care | Payer: Medicare Other | Source: Ambulatory Visit | Attending: Radiation Oncology | Admitting: Radiation Oncology

## 2013-04-26 DIAGNOSIS — C50919 Malignant neoplasm of unspecified site of unspecified female breast: Secondary | ICD-10-CM | POA: Diagnosis not present

## 2013-04-26 DIAGNOSIS — C773 Secondary and unspecified malignant neoplasm of axilla and upper limb lymph nodes: Secondary | ICD-10-CM | POA: Diagnosis not present

## 2013-04-26 DIAGNOSIS — Z510 Encounter for antineoplastic radiation therapy: Secondary | ICD-10-CM | POA: Diagnosis not present

## 2013-04-26 DIAGNOSIS — C50419 Malignant neoplasm of upper-outer quadrant of unspecified female breast: Secondary | ICD-10-CM | POA: Diagnosis not present

## 2013-04-27 ENCOUNTER — Ambulatory Visit: Payer: Medicare Other | Attending: Radiation Oncology

## 2013-04-27 ENCOUNTER — Ambulatory Visit
Admission: RE | Admit: 2013-04-27 | Discharge: 2013-04-27 | Disposition: A | Discharge status: Home / Self Care | Payer: Medicare Other | Source: Ambulatory Visit | Attending: Radiation Oncology | Admitting: Radiation Oncology

## 2013-04-27 DIAGNOSIS — Z9010 Acquired absence of unspecified breast and nipple: Secondary | ICD-10-CM | POA: Diagnosis not present

## 2013-04-27 DIAGNOSIS — Z510 Encounter for antineoplastic radiation therapy: Secondary | ICD-10-CM | POA: Diagnosis not present

## 2013-04-27 DIAGNOSIS — M24519 Contracture, unspecified shoulder: Secondary | ICD-10-CM | POA: Insufficient documentation

## 2013-04-27 DIAGNOSIS — C50919 Malignant neoplasm of unspecified site of unspecified female breast: Secondary | ICD-10-CM | POA: Insufficient documentation

## 2013-04-27 DIAGNOSIS — C773 Secondary and unspecified malignant neoplasm of axilla and upper limb lymph nodes: Secondary | ICD-10-CM | POA: Diagnosis not present

## 2013-04-27 DIAGNOSIS — IMO0001 Reserved for inherently not codable concepts without codable children: Secondary | ICD-10-CM | POA: Diagnosis not present

## 2013-04-27 DIAGNOSIS — M25519 Pain in unspecified shoulder: Secondary | ICD-10-CM | POA: Diagnosis not present

## 2013-04-28 ENCOUNTER — Ambulatory Visit
Admission: RE | Admit: 2013-04-28 | Discharge: 2013-04-28 | Disposition: A | Discharge status: Home / Self Care | Payer: Medicare Other | Source: Ambulatory Visit | Attending: Radiation Oncology | Admitting: Radiation Oncology

## 2013-04-28 DIAGNOSIS — Z510 Encounter for antineoplastic radiation therapy: Secondary | ICD-10-CM | POA: Diagnosis not present

## 2013-04-28 DIAGNOSIS — C773 Secondary and unspecified malignant neoplasm of axilla and upper limb lymph nodes: Secondary | ICD-10-CM | POA: Diagnosis not present

## 2013-04-28 DIAGNOSIS — C50919 Malignant neoplasm of unspecified site of unspecified female breast: Secondary | ICD-10-CM | POA: Diagnosis not present

## 2013-05-01 ENCOUNTER — Ambulatory Visit
Admission: RE | Admit: 2013-05-01 | Discharge: 2013-05-01 | Disposition: A | Discharge status: Home / Self Care | Payer: Medicare Other | Source: Ambulatory Visit | Attending: Radiation Oncology | Admitting: Radiation Oncology

## 2013-05-01 VITALS — BP 179/65 | HR 61 | Temp 98.3°F | Ht 63.5 in | Wt 137.6 lb

## 2013-05-01 DIAGNOSIS — C50919 Malignant neoplasm of unspecified site of unspecified female breast: Secondary | ICD-10-CM | POA: Diagnosis not present

## 2013-05-01 DIAGNOSIS — C50411 Malignant neoplasm of upper-outer quadrant of right female breast: Secondary | ICD-10-CM

## 2013-05-01 DIAGNOSIS — C773 Secondary and unspecified malignant neoplasm of axilla and upper limb lymph nodes: Secondary | ICD-10-CM | POA: Diagnosis not present

## 2013-05-01 DIAGNOSIS — Z510 Encounter for antineoplastic radiation therapy: Secondary | ICD-10-CM | POA: Diagnosis not present

## 2013-05-01 NOTE — Progress Notes (Signed)
Kandace Parkins here for weekly under treat visit.  She has had 9 fractions to her right chest wall.  She denies pain and fatigue.  Her skin has slight hyperpigmentation on her right chest wall.  She is using radiaplex gel.  Her bp on arrival was 211/69 and when retaken was 179/65.  She says it is always elevated here due to nerves.

## 2013-05-01 NOTE — Progress Notes (Signed)
   Weekly Management Note:  outpatient Current Dose:  18 Gy  Projected Dose: 60 Gy   Narrative:  The patient presents for routine under treatment assessment.  CBCT/MVCT images/Port film x-rays were reviewed.  The chart was checked. Here for weekly under treat visit. She has had 9 fractions to her right chest wall. She denies pain and fatigue. Her skin has slight hyperpigmentation on her right chest wall. She is using radiaplex gel. Her bp on arrival was 211/69 and when retaken was 179/65. She says it is always elevated here due to nerves   Physical Findings:  height is 5' 3.5" (1.613 m) and weight is 137 lb 9.6 oz (62.415 kg). Her temperature is 98.3 F (36.8 C). Her blood pressure is 179/65 and her pulse is 61.  mild hyperpigmentation of right chest wall  Impression:  The patient is tolerating radiotherapy.  Plan:  Continue radiotherapy as planned. She says her BP meds are managed by med/onc and she is taking them as prescribed.  I will contact med/onc to see if the patient should be seen earlier for further escalation of her drugs.    ________________________________   Lonie Peak, M.D.

## 2013-05-02 ENCOUNTER — Encounter (INDEPENDENT_AMBULATORY_CARE_PROVIDER_SITE_OTHER): Payer: Self-pay | Admitting: General Surgery

## 2013-05-02 ENCOUNTER — Ambulatory Visit
Admission: RE | Admit: 2013-05-02 | Discharge: 2013-05-02 | Disposition: A | Discharge status: Home / Self Care | Payer: Medicare Other | Source: Ambulatory Visit | Attending: Radiation Oncology | Admitting: Radiation Oncology

## 2013-05-02 ENCOUNTER — Ambulatory Visit: Payer: Medicare Other

## 2013-05-02 ENCOUNTER — Other Ambulatory Visit: Payer: Self-pay | Admitting: Oncology

## 2013-05-02 ENCOUNTER — Ambulatory Visit (INDEPENDENT_AMBULATORY_CARE_PROVIDER_SITE_OTHER): Payer: Medicare Other | Admitting: General Surgery

## 2013-05-02 VITALS — BP 160/96 | HR 78 | Temp 97.4°F | Resp 16 | Ht 63.5 in | Wt 136.2 lb

## 2013-05-02 DIAGNOSIS — Z510 Encounter for antineoplastic radiation therapy: Secondary | ICD-10-CM | POA: Diagnosis not present

## 2013-05-02 DIAGNOSIS — C773 Secondary and unspecified malignant neoplasm of axilla and upper limb lymph nodes: Secondary | ICD-10-CM | POA: Diagnosis not present

## 2013-05-02 DIAGNOSIS — C50411 Malignant neoplasm of upper-outer quadrant of right female breast: Secondary | ICD-10-CM

## 2013-05-02 DIAGNOSIS — C50919 Malignant neoplasm of unspecified site of unspecified female breast: Secondary | ICD-10-CM | POA: Diagnosis not present

## 2013-05-02 DIAGNOSIS — C50419 Malignant neoplasm of upper-outer quadrant of unspecified female breast: Secondary | ICD-10-CM

## 2013-05-02 NOTE — Patient Instructions (Signed)
Your mastectomy wound has healed well. The skin is healthy. There is no sign of cancer.  Continue regular physical therapy to your right shoulder  Repeat left breast mammogram in June 2015  Return to see Dr. Derrell Lolling in February 2015.

## 2013-05-02 NOTE — Progress Notes (Signed)
Patient ID: Janice Powell, female   DOB: 08-01-1945, 67 y.o.   MRN: [id number]  History:  This patient underwent right modified radical mastectomy and removal of Port-A-Cath on 03/13/2013. Final pathology report showed complete pathologic response in the breast, however 9 out of 9 lymph nodes were positive for metastatic disease. She feels well. She has begun radiation therapy and is doing well with that, 9 treatments in. She is getting physical therapy twice a week and thinks that her range of motion of her right shoulder is getting better. It is still not normal yet.  This patient noticed a right breast mass in 2011. Diagnostic mammography and right ultrasonography at [facility] 03/02/2012 showed a 6.7 cm irregular mass in the inner aspect of the right breast. There was nipple retraction and erythema around the nipple. A large lymph node was noted in the right axilla. By ultrasound the large irregular hypoechoic mass was noted in the breast with multiple enlarged axillary lymph nodes. Physical exam confirmed a hard breast with erythema around the right nipple extending inferiorly..  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  She initially declined chemotherapy, but reconsidered and I placed a Port-A-Cath on 08/15/2012.  She received neoadjuvant chemotherapy..  MRI performed on 01/19/2013 showed good response in the right breast with minimal enhancement. The level I lymph nodes are much smaller and are basically normal. Otherwise there are no abnormal findings on MRI.  Final pathology report shows a complete pathologic response in the breast, but 9 out of 9 axillary nodes are positive for metastatic disease.    Exam:  Patient looks well. No distress  Right mastectomy wound looks good. Skin is healthy. Skin flaps are stuck down very well. No sign of infection or fluid..  Range of motion right shoulder reveals that she can  abduct the right shoulder about 155.  Assessment:  Locally advanced cancer right breast, initial clinical stage T4, N2, stage IIIB, triple negative breast cancer  Status post neoadjuvant chemotherapy initiated 08/23/2012  Status post right modified radical mastectomy and removal of Port-A-Cath on 03/13/2013 revealing no residual carcinoma in the breast but 9/9 lymph node sampled involving macro metastasis, ypTX, ypN2, stage IIIA She has recovered uneventfully from her surgery.  Plan: Continue radiation therapy Continue twice weekly physical therapy to right shoulder Left breast mammogram in June 2015. This would be one year since her preop MRI Return to see me in February 2015   [facility]. Derrell Lolling, M.D., [facility] General and Minimally invasive Surgery Breast and Colorectal Surgery Office:   [phone] Pager:   [phone]

## 2013-05-03 ENCOUNTER — Ambulatory Visit
Admission: RE | Admit: 2013-05-03 | Discharge: 2013-05-03 | Disposition: A | Discharge status: Home / Self Care | Payer: Medicare Other | Source: Ambulatory Visit | Attending: Radiation Oncology | Admitting: Radiation Oncology

## 2013-05-03 DIAGNOSIS — C50919 Malignant neoplasm of unspecified site of unspecified female breast: Secondary | ICD-10-CM | POA: Diagnosis not present

## 2013-05-03 DIAGNOSIS — Z510 Encounter for antineoplastic radiation therapy: Secondary | ICD-10-CM | POA: Diagnosis not present

## 2013-05-03 DIAGNOSIS — C773 Secondary and unspecified malignant neoplasm of axilla and upper limb lymph nodes: Secondary | ICD-10-CM | POA: Diagnosis not present

## 2013-05-03 DIAGNOSIS — C50419 Malignant neoplasm of upper-outer quadrant of unspecified female breast: Secondary | ICD-10-CM | POA: Diagnosis not present

## 2013-05-04 ENCOUNTER — Ambulatory Visit
Admission: RE | Admit: 2013-05-04 | Discharge: 2013-05-04 | Disposition: A | Discharge status: Home / Self Care | Payer: Medicare Other | Source: Ambulatory Visit | Attending: Radiation Oncology | Admitting: Radiation Oncology

## 2013-05-04 DIAGNOSIS — Z510 Encounter for antineoplastic radiation therapy: Secondary | ICD-10-CM | POA: Diagnosis not present

## 2013-05-04 DIAGNOSIS — C50919 Malignant neoplasm of unspecified site of unspecified female breast: Secondary | ICD-10-CM | POA: Diagnosis not present

## 2013-05-04 DIAGNOSIS — C773 Secondary and unspecified malignant neoplasm of axilla and upper limb lymph nodes: Secondary | ICD-10-CM | POA: Diagnosis not present

## 2013-05-05 ENCOUNTER — Ambulatory Visit
Admission: RE | Admit: 2013-05-05 | Discharge: 2013-05-05 | Disposition: A | Discharge status: Home / Self Care | Payer: Medicare Other | Source: Ambulatory Visit | Attending: Radiation Oncology | Admitting: Radiation Oncology

## 2013-05-05 DIAGNOSIS — Z510 Encounter for antineoplastic radiation therapy: Secondary | ICD-10-CM | POA: Diagnosis not present

## 2013-05-05 DIAGNOSIS — C50919 Malignant neoplasm of unspecified site of unspecified female breast: Secondary | ICD-10-CM | POA: Diagnosis not present

## 2013-05-05 DIAGNOSIS — C773 Secondary and unspecified malignant neoplasm of axilla and upper limb lymph nodes: Secondary | ICD-10-CM | POA: Diagnosis not present

## 2013-05-08 ENCOUNTER — Ambulatory Visit
Admission: RE | Admit: 2013-05-08 | Discharge: 2013-05-08 | Disposition: A | Discharge status: Home / Self Care | Payer: Medicare Other | Source: Ambulatory Visit | Attending: Radiation Oncology | Admitting: Radiation Oncology

## 2013-05-08 ENCOUNTER — Encounter: Payer: Self-pay | Admitting: Radiation Oncology

## 2013-05-08 VITALS — BP 158/84 | HR 70 | Temp 98.5°F | Ht 63.5 in | Wt 138.1 lb

## 2013-05-08 DIAGNOSIS — Z510 Encounter for antineoplastic radiation therapy: Secondary | ICD-10-CM | POA: Diagnosis not present

## 2013-05-08 DIAGNOSIS — C773 Secondary and unspecified malignant neoplasm of axilla and upper limb lymph nodes: Secondary | ICD-10-CM | POA: Diagnosis not present

## 2013-05-08 DIAGNOSIS — C50919 Malignant neoplasm of unspecified site of unspecified female breast: Secondary | ICD-10-CM | POA: Diagnosis not present

## 2013-05-08 DIAGNOSIS — C50411 Malignant neoplasm of upper-outer quadrant of right female breast: Secondary | ICD-10-CM

## 2013-05-08 NOTE — Progress Notes (Signed)
   Weekly Management Note:  outpatient Current Dose:  28 Gy  Projected Dose: 60 Gy   Narrative:  The patient presents for routine under treatment assessment.  CBCT/MVCT images/Port film x-rays were reviewed.  The chart was checked. Not applying radiaplex, she reports.  Physical Findings:  height is 5' 3.5" (1.613 m) and weight is 138 lb 1.6 oz (62.642 kg). Her temperature is 98.5 F (36.9 C). Her blood pressure is 158/84 and her pulse is 70.  NAD, right chest wall a little dry and erythematous.  Impression:  The patient is tolerating radiotherapy.  Plan:  Continue radiotherapy as planned. Advised her to put radiaplex on chest/ neck / upper back (right) BID.  ________________________________   Lonie Peak, M.D.

## 2013-05-08 NOTE — Progress Notes (Signed)
Janice Powell has received 14 fractions  To her right chest wall.  Note erythema on the chest wall, but skin remains intact.  She c/o tenderness in the tx field. And is using Radiaplex Gel.

## 2013-05-09 ENCOUNTER — Ambulatory Visit: Admission: RE | Admit: 2013-05-09 | Payer: Medicare Other | Source: Ambulatory Visit

## 2013-05-10 ENCOUNTER — Ambulatory Visit
Admission: RE | Admit: 2013-05-10 | Discharge: 2013-05-10 | Disposition: A | Discharge status: Home / Self Care | Payer: Medicare Other | Source: Ambulatory Visit | Attending: Radiation Oncology | Admitting: Radiation Oncology

## 2013-05-10 DIAGNOSIS — C773 Secondary and unspecified malignant neoplasm of axilla and upper limb lymph nodes: Secondary | ICD-10-CM | POA: Diagnosis not present

## 2013-05-10 DIAGNOSIS — Z510 Encounter for antineoplastic radiation therapy: Secondary | ICD-10-CM | POA: Diagnosis not present

## 2013-05-10 DIAGNOSIS — C50919 Malignant neoplasm of unspecified site of unspecified female breast: Secondary | ICD-10-CM | POA: Diagnosis not present

## 2013-05-11 ENCOUNTER — Ambulatory Visit
Admission: RE | Admit: 2013-05-11 | Discharge: 2013-05-11 | Disposition: A | Discharge status: Home / Self Care | Payer: Medicare Other | Source: Ambulatory Visit | Attending: Radiation Oncology | Admitting: Radiation Oncology

## 2013-05-11 DIAGNOSIS — Z510 Encounter for antineoplastic radiation therapy: Secondary | ICD-10-CM | POA: Diagnosis not present

## 2013-05-11 DIAGNOSIS — C50419 Malignant neoplasm of upper-outer quadrant of unspecified female breast: Secondary | ICD-10-CM | POA: Diagnosis not present

## 2013-05-11 DIAGNOSIS — C50919 Malignant neoplasm of unspecified site of unspecified female breast: Secondary | ICD-10-CM | POA: Diagnosis not present

## 2013-05-11 DIAGNOSIS — C773 Secondary and unspecified malignant neoplasm of axilla and upper limb lymph nodes: Secondary | ICD-10-CM | POA: Diagnosis not present

## 2013-05-12 ENCOUNTER — Ambulatory Visit
Admission: RE | Admit: 2013-05-12 | Discharge: 2013-05-12 | Disposition: A | Discharge status: Home / Self Care | Payer: Medicare Other | Source: Ambulatory Visit | Attending: Radiation Oncology | Admitting: Radiation Oncology

## 2013-05-12 DIAGNOSIS — C50919 Malignant neoplasm of unspecified site of unspecified female breast: Secondary | ICD-10-CM | POA: Diagnosis not present

## 2013-05-12 DIAGNOSIS — Z510 Encounter for antineoplastic radiation therapy: Secondary | ICD-10-CM | POA: Diagnosis not present

## 2013-05-12 DIAGNOSIS — C773 Secondary and unspecified malignant neoplasm of axilla and upper limb lymph nodes: Secondary | ICD-10-CM | POA: Diagnosis not present

## 2013-05-15 ENCOUNTER — Ambulatory Visit
Admission: RE | Admit: 2013-05-15 | Discharge: 2013-05-15 | Disposition: A | Discharge status: Home / Self Care | Payer: Medicare Other | Source: Ambulatory Visit | Attending: Radiation Oncology | Admitting: Radiation Oncology

## 2013-05-15 DIAGNOSIS — Z510 Encounter for antineoplastic radiation therapy: Secondary | ICD-10-CM | POA: Diagnosis not present

## 2013-05-15 DIAGNOSIS — C50411 Malignant neoplasm of upper-outer quadrant of right female breast: Secondary | ICD-10-CM

## 2013-05-15 DIAGNOSIS — C773 Secondary and unspecified malignant neoplasm of axilla and upper limb lymph nodes: Secondary | ICD-10-CM | POA: Diagnosis not present

## 2013-05-15 DIAGNOSIS — C50919 Malignant neoplasm of unspecified site of unspecified female breast: Secondary | ICD-10-CM | POA: Diagnosis not present

## 2013-05-15 NOTE — Progress Notes (Signed)
   Weekly Management Note:  outpatient Current Dose:  36 Gy  Projected Dose: 60 Gy   Narrative:  The patient presents for routine under treatment assessment.  CBCT/MVCT images/Port film x-rays were reviewed.  The chart was checked. Doing well, no new complaints  Physical Findings:  vitals were not taken for this visit. Right chest wall skin is dry, erythematous, intact.  Impression:  The patient is tolerating radiotherapy.  Plan:  Continue radiotherapy as planned. May use radiaplex BID - TID.  ________________________________   Lonie Peak, M.D.

## 2013-05-16 ENCOUNTER — Ambulatory Visit
Admission: RE | Admit: 2013-05-16 | Discharge: 2013-05-16 | Disposition: A | Discharge status: Home / Self Care | Payer: Medicare Other | Source: Ambulatory Visit | Attending: Radiation Oncology | Admitting: Radiation Oncology

## 2013-05-16 DIAGNOSIS — C773 Secondary and unspecified malignant neoplasm of axilla and upper limb lymph nodes: Secondary | ICD-10-CM | POA: Diagnosis not present

## 2013-05-16 DIAGNOSIS — Z510 Encounter for antineoplastic radiation therapy: Secondary | ICD-10-CM | POA: Diagnosis not present

## 2013-05-16 DIAGNOSIS — C50919 Malignant neoplasm of unspecified site of unspecified female breast: Secondary | ICD-10-CM | POA: Diagnosis not present

## 2013-05-17 ENCOUNTER — Ambulatory Visit
Admission: RE | Admit: 2013-05-17 | Discharge: 2013-05-17 | Disposition: A | Discharge status: Home / Self Care | Payer: Medicare Other | Source: Ambulatory Visit | Attending: Radiation Oncology | Admitting: Radiation Oncology

## 2013-05-17 DIAGNOSIS — Z510 Encounter for antineoplastic radiation therapy: Secondary | ICD-10-CM | POA: Diagnosis not present

## 2013-05-17 DIAGNOSIS — C773 Secondary and unspecified malignant neoplasm of axilla and upper limb lymph nodes: Secondary | ICD-10-CM | POA: Diagnosis not present

## 2013-05-17 DIAGNOSIS — C50919 Malignant neoplasm of unspecified site of unspecified female breast: Secondary | ICD-10-CM | POA: Diagnosis not present

## 2013-05-18 ENCOUNTER — Ambulatory Visit: Payer: Medicare Other | Admitting: Physical Therapy

## 2013-05-18 ENCOUNTER — Ambulatory Visit
Admission: RE | Admit: 2013-05-18 | Discharge: 2013-05-18 | Disposition: A | Discharge status: Home / Self Care | Payer: Medicare Other | Source: Ambulatory Visit | Attending: Radiation Oncology | Admitting: Radiation Oncology

## 2013-05-18 DIAGNOSIS — Z510 Encounter for antineoplastic radiation therapy: Secondary | ICD-10-CM | POA: Diagnosis not present

## 2013-05-18 DIAGNOSIS — C50419 Malignant neoplasm of upper-outer quadrant of unspecified female breast: Secondary | ICD-10-CM | POA: Diagnosis not present

## 2013-05-18 DIAGNOSIS — C773 Secondary and unspecified malignant neoplasm of axilla and upper limb lymph nodes: Secondary | ICD-10-CM | POA: Diagnosis not present

## 2013-05-18 DIAGNOSIS — C50919 Malignant neoplasm of unspecified site of unspecified female breast: Secondary | ICD-10-CM | POA: Diagnosis not present

## 2013-05-19 ENCOUNTER — Ambulatory Visit
Admission: RE | Admit: 2013-05-19 | Discharge: 2013-05-19 | Disposition: A | Discharge status: Home / Self Care | Payer: Medicare Other | Source: Ambulatory Visit | Attending: Radiation Oncology | Admitting: Radiation Oncology

## 2013-05-19 ENCOUNTER — Encounter: Payer: Self-pay | Admitting: Radiation Oncology

## 2013-05-19 DIAGNOSIS — C50919 Malignant neoplasm of unspecified site of unspecified female breast: Secondary | ICD-10-CM | POA: Diagnosis not present

## 2013-05-19 DIAGNOSIS — C773 Secondary and unspecified malignant neoplasm of axilla and upper limb lymph nodes: Secondary | ICD-10-CM | POA: Diagnosis not present

## 2013-05-19 DIAGNOSIS — Z510 Encounter for antineoplastic radiation therapy: Secondary | ICD-10-CM | POA: Diagnosis not present

## 2013-05-19 DIAGNOSIS — C50419 Malignant neoplasm of upper-outer quadrant of unspecified female breast: Secondary | ICD-10-CM | POA: Diagnosis not present

## 2013-05-19 NOTE — Progress Notes (Signed)
Photon Boost  Complex Simulation, Treatment Planning Note  Diagnosis: Right breast cancer  The patient's CT images from original simulation were reviewed (and tissue depth measured) to plan her boost treatment to her right chest wall scar.  Markings for margin around the scar we made on the [facility]. The boost will be delivered with an electron field, electrons Rx'd to 95% isodose line, using custom blocking. Daily 1cm bolus.  Special port plan approved.  -----------------------------------  Lonie Peak, MD

## 2013-05-22 ENCOUNTER — Ambulatory Visit
Admission: RE | Admit: 2013-05-22 | Discharge: 2013-05-22 | Disposition: A | Discharge status: Home / Self Care | Payer: Medicare Other | Source: Ambulatory Visit | Attending: Radiation Oncology | Admitting: Radiation Oncology

## 2013-05-22 ENCOUNTER — Encounter: Payer: Self-pay | Admitting: Radiation Oncology

## 2013-05-22 VITALS — BP 183/78 | HR 62 | Temp 98.3°F | Wt 137.9 lb

## 2013-05-22 DIAGNOSIS — C50919 Malignant neoplasm of unspecified site of unspecified female breast: Secondary | ICD-10-CM | POA: Diagnosis not present

## 2013-05-22 DIAGNOSIS — C50211 Malignant neoplasm of upper-inner quadrant of right female breast: Secondary | ICD-10-CM

## 2013-05-22 DIAGNOSIS — C773 Secondary and unspecified malignant neoplasm of axilla and upper limb lymph nodes: Secondary | ICD-10-CM | POA: Diagnosis not present

## 2013-05-22 DIAGNOSIS — C50411 Malignant neoplasm of upper-outer quadrant of right female breast: Secondary | ICD-10-CM

## 2013-05-22 DIAGNOSIS — Z510 Encounter for antineoplastic radiation therapy: Secondary | ICD-10-CM | POA: Diagnosis not present

## 2013-05-22 NOTE — Progress Notes (Signed)
   Weekly Management Note:  outpatient Current Dose:  46 Gy  Projected Dose: 60 Gy   Narrative:  The patient presents for routine under treatment assessment.  CBCT/MVCT images/Port film x-rays were reviewed.  The chart was checked. Doing well. More skin irritation.   Physical Findings:  weight is 137 lb 14.4 oz (62.551 kg). Her temperature is 98.3 F (36.8 C). Her blood pressure is 183/78 and her pulse is 62.   Diffuse erythema over right chest wall, lower neck. Faint pigmentation over upper right back. Dry peel upper R chest.  Impression:  The patient is tolerating radiotherapy.  Plan:  Continue radiotherapy as planned. May add neosporin if areas of wet desquamation develop.  ________________________________   Lonie Peak, M.D.

## 2013-05-22 NOTE — Progress Notes (Addendum)
Patient here for routine weekly assessment of radiation to right mastectomy chest wall.Skin hyperpigmented without peeling.Applying radiaplex 4 times daily.Most discomfort of right axilla.Mild fatigue.To start boost on Thursday.

## 2013-05-23 ENCOUNTER — Ambulatory Visit
Admission: RE | Admit: 2013-05-23 | Discharge: 2013-05-23 | Disposition: A | Discharge status: Home / Self Care | Payer: Medicare Other | Source: Ambulatory Visit | Attending: Radiation Oncology | Admitting: Radiation Oncology

## 2013-05-23 DIAGNOSIS — C50919 Malignant neoplasm of unspecified site of unspecified female breast: Secondary | ICD-10-CM | POA: Diagnosis not present

## 2013-05-23 DIAGNOSIS — Z510 Encounter for antineoplastic radiation therapy: Secondary | ICD-10-CM | POA: Diagnosis not present

## 2013-05-23 DIAGNOSIS — C773 Secondary and unspecified malignant neoplasm of axilla and upper limb lymph nodes: Secondary | ICD-10-CM | POA: Diagnosis not present

## 2013-05-24 ENCOUNTER — Ambulatory Visit
Admission: RE | Admit: 2013-05-24 | Discharge: 2013-05-24 | Disposition: A | Discharge status: Home / Self Care | Payer: Medicare Other | Source: Ambulatory Visit | Attending: Radiation Oncology | Admitting: Radiation Oncology

## 2013-05-24 DIAGNOSIS — C773 Secondary and unspecified malignant neoplasm of axilla and upper limb lymph nodes: Secondary | ICD-10-CM | POA: Diagnosis not present

## 2013-05-24 DIAGNOSIS — Z510 Encounter for antineoplastic radiation therapy: Secondary | ICD-10-CM | POA: Diagnosis not present

## 2013-05-24 DIAGNOSIS — C50919 Malignant neoplasm of unspecified site of unspecified female breast: Secondary | ICD-10-CM | POA: Diagnosis not present

## 2013-05-25 ENCOUNTER — Ambulatory Visit
Admission: RE | Admit: 2013-05-25 | Discharge: 2013-05-25 | Disposition: A | Discharge status: Home / Self Care | Payer: Medicare Other | Source: Ambulatory Visit | Attending: Radiation Oncology | Admitting: Radiation Oncology

## 2013-05-25 DIAGNOSIS — C50919 Malignant neoplasm of unspecified site of unspecified female breast: Secondary | ICD-10-CM | POA: Diagnosis not present

## 2013-05-25 DIAGNOSIS — Z510 Encounter for antineoplastic radiation therapy: Secondary | ICD-10-CM | POA: Diagnosis not present

## 2013-05-25 DIAGNOSIS — C50419 Malignant neoplasm of upper-outer quadrant of unspecified female breast: Secondary | ICD-10-CM | POA: Diagnosis not present

## 2013-05-25 DIAGNOSIS — C773 Secondary and unspecified malignant neoplasm of axilla and upper limb lymph nodes: Secondary | ICD-10-CM | POA: Diagnosis not present

## 2013-05-26 ENCOUNTER — Ambulatory Visit
Admission: RE | Admit: 2013-05-26 | Discharge: 2013-05-26 | Disposition: A | Discharge status: Home / Self Care | Payer: Medicare Other | Source: Ambulatory Visit | Attending: Radiation Oncology | Admitting: Radiation Oncology

## 2013-05-26 DIAGNOSIS — Z510 Encounter for antineoplastic radiation therapy: Secondary | ICD-10-CM | POA: Diagnosis not present

## 2013-05-26 DIAGNOSIS — C773 Secondary and unspecified malignant neoplasm of axilla and upper limb lymph nodes: Secondary | ICD-10-CM | POA: Diagnosis not present

## 2013-05-26 DIAGNOSIS — C50919 Malignant neoplasm of unspecified site of unspecified female breast: Secondary | ICD-10-CM | POA: Diagnosis not present

## 2013-05-29 ENCOUNTER — Ambulatory Visit
Admission: RE | Admit: 2013-05-29 | Discharge: 2013-05-29 | Disposition: A | Discharge status: Home / Self Care | Payer: Medicare Other | Source: Ambulatory Visit | Attending: Radiation Oncology | Admitting: Radiation Oncology

## 2013-05-29 ENCOUNTER — Encounter: Payer: Self-pay | Admitting: Radiation Oncology

## 2013-05-29 VITALS — BP 201/84 | HR 60 | Temp 97.8°F | Ht 63.5 in | Wt 138.8 lb

## 2013-05-29 DIAGNOSIS — C773 Secondary and unspecified malignant neoplasm of axilla and upper limb lymph nodes: Secondary | ICD-10-CM | POA: Diagnosis not present

## 2013-05-29 DIAGNOSIS — C50411 Malignant neoplasm of upper-outer quadrant of right female breast: Secondary | ICD-10-CM

## 2013-05-29 DIAGNOSIS — C50211 Malignant neoplasm of upper-inner quadrant of right female breast: Secondary | ICD-10-CM

## 2013-05-29 DIAGNOSIS — C50919 Malignant neoplasm of unspecified site of unspecified female breast: Secondary | ICD-10-CM | POA: Diagnosis not present

## 2013-05-29 DIAGNOSIS — Z510 Encounter for antineoplastic radiation therapy: Secondary | ICD-10-CM | POA: Diagnosis not present

## 2013-05-29 MED ORDER — BIAFINE EX EMUL
CUTANEOUS | Status: DC | PRN
  Administered 2013-05-29: 15:00:00 via TOPICAL

## 2013-05-29 NOTE — Progress Notes (Signed)
   Weekly Management Note:  outpatient Current Dose:  56 Gy  Projected Dose: 60 Gy   Narrative:  The patient presents for routine under treatment assessment.  CBCT/MVCT images/Port film x-rays were reviewed.  The chart was checked.  Burning sensation in axilla.  Physical Findings:  height is 5' 3.5" (1.613 m) and weight is 138 lb 12.8 oz (62.959 kg). Her temperature is 97.8 F (36.6 C). Her blood pressure is 201/84 and her pulse is 60.    Peeling (dry with a little moistness) in axilla and patches of chest wall.  Impression:  The patient is tolerating radiotherapy.  Plan:  Continue radiotherapy as planned. Will add hydrogel pads PRN, biafine, and continue neosporin.  ________________________________   Lonie Peak, M.D.

## 2013-05-29 NOTE — Progress Notes (Addendum)
Janice Powell has received 28 fractions to her right chest wall.  Moist and dry desquamation throughout the tx field.  Most discomfort in the right axillary area.  Placed and given hydrogel pads to sooth the area and given biafine to use on her right upper back and in the anterior field as well.  Grades discomfort as a level 5 on a scale of 0-10.

## 2013-05-30 ENCOUNTER — Ambulatory Visit
Admission: RE | Admit: 2013-05-30 | Discharge: 2013-05-30 | Disposition: A | Discharge status: Home / Self Care | Payer: Medicare Other | Source: Ambulatory Visit | Attending: Radiation Oncology | Admitting: Radiation Oncology

## 2013-05-30 DIAGNOSIS — C773 Secondary and unspecified malignant neoplasm of axilla and upper limb lymph nodes: Secondary | ICD-10-CM | POA: Diagnosis not present

## 2013-05-30 DIAGNOSIS — Z510 Encounter for antineoplastic radiation therapy: Secondary | ICD-10-CM | POA: Diagnosis not present

## 2013-05-30 DIAGNOSIS — C50919 Malignant neoplasm of unspecified site of unspecified female breast: Secondary | ICD-10-CM | POA: Diagnosis not present

## 2013-05-31 ENCOUNTER — Encounter: Payer: Self-pay | Admitting: Radiation Oncology

## 2013-05-31 ENCOUNTER — Ambulatory Visit
Admission: RE | Admit: 2013-05-31 | Discharge: 2013-05-31 | Disposition: A | Discharge status: Home / Self Care | Payer: Medicare Other | Source: Ambulatory Visit | Attending: Radiation Oncology | Admitting: Radiation Oncology

## 2013-05-31 ENCOUNTER — Ambulatory Visit: Payer: Medicare Other

## 2013-05-31 DIAGNOSIS — C773 Secondary and unspecified malignant neoplasm of axilla and upper limb lymph nodes: Secondary | ICD-10-CM | POA: Diagnosis not present

## 2013-05-31 DIAGNOSIS — Z510 Encounter for antineoplastic radiation therapy: Secondary | ICD-10-CM | POA: Diagnosis not present

## 2013-05-31 DIAGNOSIS — C50919 Malignant neoplasm of unspecified site of unspecified female breast: Secondary | ICD-10-CM | POA: Diagnosis not present

## 2013-05-31 NOTE — Progress Notes (Signed)
   Weekly Management Note  outpatient  Completed Radiotherapy. Total Dose: 60 Gy   Narrative:  The patient presents for routine under treatment assessment on last day of radiotherapy.  CBCT/MVCT images/Port film x-rays were reviewed.  The chart was checked. Using Biafine, hydrogel, and neosporin over skin.  Physical Findings:  vitals were not taken for this visit.  Right Upper chest wall shows dry and moist desquamation - most moist in axilla - no sign of infection, new skin islands forming.   Impression:  The patient has tolerated radiotherapy.  Plan:  Routine follow-up in one month. Continue current skin care. Call if issues arise before followup. ________________________________   Lonie Peak, M.D.

## 2013-05-31 NOTE — Progress Notes (Signed)
[facility] Radiation Oncology End of Treatment Note  Name:Lakita Kreger  Date: 05/31/2013 ZOX:[id number] DOB:1946-05-01    DIAGNOSIS: ypTX, ypN2a, ypMX Right Breast Cancer, high grade, triple negative  Clinical T4N1-2Mx (Stage III)   INDICATION FOR TREATMENT: Curative   TREATMENT DATES:  To 05-31-13                          Site/dose:    1) Right Chest Wall and IM nodes / 50 Gy in 25 fractions 2) Right Supraclavicular fossa / 50 Gy in 25 fractions 3) Right Posterior Axillary boost / 6.55 Gy in 25 fractions 4) Right Chest Wall Scar boost / 10 Gy in 5 fractions  Beams/energy:    1) Opposed tangents / 10 and 6 MV photons 2) Left anterior oblique / 10 MV photons 3) PA / photons 4) En face electrons / 6 MeV electrons   NARRATIVE: She tolerated RT well. At her last fraction, the right Upper chest wall showed dry and moist desquamation - most moist in axilla - no sign of infection, new skin islands forming.    PLAN: PET scan on 06-29-13, followup with med/onc 12-11. Routine followup in one month with my partner while I am on leave, on 12-11 after med/onc appointment. Continue Biafine, hydrogel, and neosporin over skin. Patient instructed to call if questions or worsening complaints in interim.  -----------------------------------  Lonie Peak, MD

## 2013-06-01 ENCOUNTER — Ambulatory Visit: Payer: Medicare Other

## 2013-06-01 ENCOUNTER — Other Ambulatory Visit: Payer: Self-pay

## 2013-06-02 ENCOUNTER — Ambulatory Visit: Payer: Medicare Other

## 2013-06-29 ENCOUNTER — Other Ambulatory Visit (HOSPITAL_BASED_OUTPATIENT_CLINIC_OR_DEPARTMENT_OTHER): Payer: Medicare Other | Admitting: Lab

## 2013-06-29 ENCOUNTER — Encounter (HOSPITAL_COMMUNITY)
Admission: RE | Admit: 2013-06-29 | Discharge: 2013-06-29 | Disposition: A | Discharge status: Home / Self Care | Payer: Medicare Other | Source: Ambulatory Visit | Attending: Oncology | Admitting: Oncology

## 2013-06-29 ENCOUNTER — Encounter (HOSPITAL_COMMUNITY): Payer: Self-pay

## 2013-06-29 ENCOUNTER — Other Ambulatory Visit: Payer: Self-pay | Admitting: Oncology

## 2013-06-29 ENCOUNTER — Ambulatory Visit (HOSPITAL_COMMUNITY)
Admission: RE | Admit: 2013-06-29 | Discharge: 2013-06-29 | Disposition: A | Discharge status: Home / Self Care | Payer: Medicare Other | Source: Ambulatory Visit | Attending: Oncology | Admitting: Oncology

## 2013-06-29 DIAGNOSIS — C50919 Malignant neoplasm of unspecified site of unspecified female breast: Secondary | ICD-10-CM | POA: Insufficient documentation

## 2013-06-29 DIAGNOSIS — C50911 Malignant neoplasm of unspecified site of right female breast: Secondary | ICD-10-CM

## 2013-06-29 DIAGNOSIS — Z9010 Acquired absence of unspecified breast and nipple: Secondary | ICD-10-CM | POA: Insufficient documentation

## 2013-06-29 DIAGNOSIS — C50319 Malignant neoplasm of lower-inner quadrant of unspecified female breast: Secondary | ICD-10-CM

## 2013-06-29 DIAGNOSIS — Z923 Personal history of irradiation: Secondary | ICD-10-CM | POA: Insufficient documentation

## 2013-06-29 DIAGNOSIS — R911 Solitary pulmonary nodule: Secondary | ICD-10-CM | POA: Insufficient documentation

## 2013-06-29 DIAGNOSIS — C773 Secondary and unspecified malignant neoplasm of axilla and upper limb lymph nodes: Secondary | ICD-10-CM | POA: Diagnosis not present

## 2013-06-29 DIAGNOSIS — R234 Changes in skin texture: Secondary | ICD-10-CM | POA: Insufficient documentation

## 2013-06-29 DIAGNOSIS — Z9221 Personal history of antineoplastic chemotherapy: Secondary | ICD-10-CM | POA: Insufficient documentation

## 2013-06-29 LAB — COMPREHENSIVE METABOLIC PANEL (CC13)
ALT: 17 U/L (ref 0–55)
AST: 19 U/L (ref 5–34)
Alkaline Phosphatase: 97 U/L (ref 40–150)
Anion Gap: 12 mEq/L — ABNORMAL HIGH (ref 3–11)
CO2: 28 mEq/L (ref 22–29)
Creatinine: 0.8 mg/dL (ref 0.6–1.1)
Total Bilirubin: 0.84 mg/dL (ref 0.20–1.20)

## 2013-06-29 LAB — CBC WITH DIFFERENTIAL/PLATELET
BASO%: 0.3 % (ref 0.0–2.0)
EOS%: 1.2 % (ref 0.0–7.0)
HCT: 42.3 % (ref 34.8–46.6)
LYMPH%: 19.2 % (ref 14.0–49.7)
MCH: 31.7 pg (ref 25.1–34.0)
MCHC: 33 g/dL (ref 31.5–36.0)
MONO%: 12.3 % (ref 0.0–14.0)
NEUT%: 67 % (ref 38.4–76.8)
Platelets: 203 10*3/uL (ref 145–400)
RBC: 4.41 10*6/uL (ref 3.70–5.45)

## 2013-06-29 LAB — GLUCOSE, CAPILLARY: Glucose-Capillary: 106 mg/dL — ABNORMAL HIGH (ref 70–99)

## 2013-06-29 MED ORDER — FLUDEOXYGLUCOSE F - 18 (FDG) INJECTION
17.4000 | Freq: Once | INTRAVENOUS | Status: AC | PRN
  Administered 2013-06-29: 17.4 via INTRAVENOUS

## 2013-06-29 MED ORDER — IOHEXOL 300 MG/ML  SOLN
80.0000 mL | Freq: Once | INTRAMUSCULAR | Status: AC | PRN
  Administered 2013-06-29: 80 mL via INTRAVENOUS

## 2013-06-30 ENCOUNTER — Encounter: Payer: Self-pay | Admitting: Radiation Oncology

## 2013-07-06 ENCOUNTER — Telehealth: Payer: Self-pay | Admitting: *Deleted

## 2013-07-06 ENCOUNTER — Ambulatory Visit (HOSPITAL_BASED_OUTPATIENT_CLINIC_OR_DEPARTMENT_OTHER): Payer: Medicare Other | Admitting: Oncology

## 2013-07-06 ENCOUNTER — Ambulatory Visit: Admission: RE | Admit: 2013-07-06 | Payer: Medicare Other | Source: Ambulatory Visit | Admitting: Radiation Oncology

## 2013-07-06 VITALS — BP 180/88 | HR 71 | Temp 98.6°F | Resp 18 | Ht 63.5 in | Wt 140.8 lb

## 2013-07-06 DIAGNOSIS — C50219 Malignant neoplasm of upper-inner quadrant of unspecified female breast: Secondary | ICD-10-CM | POA: Diagnosis not present

## 2013-07-06 DIAGNOSIS — Z171 Estrogen receptor negative status [ER-]: Secondary | ICD-10-CM

## 2013-07-06 DIAGNOSIS — C50411 Malignant neoplasm of upper-outer quadrant of right female breast: Secondary | ICD-10-CM

## 2013-07-06 DIAGNOSIS — C773 Secondary and unspecified malignant neoplasm of axilla and upper limb lymph nodes: Secondary | ICD-10-CM | POA: Diagnosis not present

## 2013-07-06 HISTORY — DX: Personal history of irradiation: Z92.3

## 2013-07-06 NOTE — Addendum Note (Signed)
Addended by: Billey Co on: 07/06/2013 05:07 PM   Modules accepted: Orders

## 2013-07-06 NOTE — Telephone Encounter (Signed)
appts made and printed...td 

## 2013-07-06 NOTE — Progress Notes (Signed)
Patient ID: Janice Powell, female   DOB: 16-Apr-1946, 67 y.o.   MRN: [id number] Janice Powell   DOB: [birth year]  MR#: [id number]  CSN#:[id number]  PCP: Londell Moh, MD GYN:  SU: Claud Kelp MD OTHER MD: Lonie Peak   HISTORY OF PRESENT ILLNESS: Janice Powell noted a mass in her right breast sometime in 2011. 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 More recently her husband twisted her arm to get the mass looked at, and she brought it to Dr. Carolee Rota attention. Diagnostic mammography and right ultrasonography at [facility] 03/02/2012 showed a 6.78 cm irregular mass in the inner aspect of the right breast. There was nipple retraction and erythema around the nipple. The largest lymph node was noted in the right axilla. By ultrasound the large irregular hypoechoic mass was noted in the breast with multiple enlarged axillary lymph nodes. Physical exam confirmed a hard breast with erythema around the right nipple extending inferiorly. The nipple is slightly inverted.  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 Her subsequent history is as detailed below  INTERVAL HISTORY: Janice Powell returns today for follow up of her breast cancer. Since her last visit here she completed her radiation treatments. She had mild fatigue and some fairly significant skin changes all that is now much improved although she still complains of hyperpigmentation. Her hair is growing back nicely. She is "not quite back to baseline but close".  REVIEW OF SYSTEMS: Janice Powell denies any unusual headaches, visual changes, nausea, vomiting, dizziness, or gait imbalance. A detailed review of systems today was otherwise entirely negative. She is looking forward to the holidays, and she is currently doing all the cooking with a family gathering in her home  PAST MEDICAL HISTORY: Past Medical History  Diagnosis  Date  . Hypertension   . Pneumonia     hx  . GERD (gastroesophageal reflux disease)     occ  . Osteopenia   . Breast cancer 03/08/13    right breast - Invasive Ductal Carcinoma   . S/P radiation therapy     1) Right Chest Wall and IM nodes / 50 Gy in 25 fractions /2) Right Supraclavicular fossa / 50 Gy in 25 fractions/1) Right Chest Wall and IM nodes / 50 Gy in 25 fractions /2) Right Supraclavicular fossa / 50 Gy in 25 fractions/3) Right Posterior Axillary boost / 6.55 Gy in 25 fractions/4) Right Chest Wall Scar boost / 10 Gy in 5 fractions      GERD osteopenia  PAST SURGICAL HISTORY: Past Surgical History  Procedure Laterality Date  . Appendectomy  1992  . Tubal ligation  1979  . Cholecystectomy  2000  . Portacath placement  08/15/2012    Procedure: INSERTION PORT-A-CATH;  Surgeon: Ernestene Mention, MD;  Location: [facility] OR;  Service: General;  Laterality: Left;  Marland Kitchen Mastectomy Right 03/13/2013  . Breast biopsy Right   . Port-a-cath removal  03/13/2013  . Mastectomy modified radical Right 03/13/2013    Procedure: MASTECTOMY MODIFIED RADICAL;  Surgeon: Ernestene Mention, MD;  Location: [facility] OR;  Service: General;  Laterality: Right;  . Port-a-cath removal Left 03/13/2013    Procedure: REMOVAL PORT-A-CATH;  Surgeon: Ernestene Mention, MD;  Location: [facility] OR;  Service: General;  Laterality: Left;    FAMILY HISTORY Family History  Problem Relation Age of Onset  . Heart disease Mother    the patient's  father died in his sleep at age 33. The patient's mother died at the age of 51 from a myocardial infarction. The patient has one brother and 2 sisters, all surviving. There is no history of breast or ovarian cancer in the family.  GYNECOLOGIC HISTORY: Menarche age 54, first live birth age 12, she is GX P5, menopause age 53. She did not take hormone replacement.  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 Her husband Janice Powell worked in the  post office 41 years, and is also now retired. He is a former Arts development officer. Son Janice Powell lives in [place] and manages an [employer] store. Daughter Janice Powell also lives in [place] and manages the IT Department at [employer]. Son Janice Powell is a Therapist, sports. Daughter Janice Powell is a Doctor, hospital for [employer]. Son [name] is an Teaching laboratory technician. The patient has 8 grandchildren. She attends the white [employer]   ADVANCED DIRECTIVES: Not in place  HEALTH MAINTENANCE: History  Substance Use Topics  . Smoking status: Never Smoker   . Smokeless tobacco: Never Used  . Alcohol Use: No     Colonoscopy:  PAP:  Bone density:  January 2011  Lipid panel:  No Known Allergies  Current Outpatient Prescriptions  Medication Sig Dispense Refill  . lisinopril (PRINIVIL) 10 MG tablet Take 1 tablet (10 mg total) by mouth daily.  30 tablet  12  . metoprolol succinate (TOPROL XL) 50 MG 24 hr tablet Take 1 tablet (50 mg total) by mouth daily. Take with or immediately following a meal.  90 tablet  0  . non-metallic deodorant (ALRA) MISC Apply 1 application topically daily as needed.      . traMADol (ULTRAM) 50 MG tablet Take 1 tablet (50 mg total) by mouth every 6 (six) hours as needed for pain.  20 tablet  0  . UNABLE TO FIND Rx: Z6109- Mastectomy Form, RT (Quantity: 1) L8000- Mastectomy Bra (Quantity: 3) Dx: 174.9; Right mastectomy  1 each  0  . UNABLE TO FIND Rx: L8000- Post Surgical Bras (Quantity: 6) U0454- Silicone Breast Prosthesis (Quantity: 1) Dx: 174.9; Right mastectomy  1 each  0  . Wound Cleansers (RADIAPLEX EX) Apply topically.       No current facility-administered medications for this visit.    OBJECTIVE: Middle-aged [place] American woman who appears stated age 24 Vitals:   07/06/13 0951  BP: 180/88  Pulse: 71  Temp: 98.6 F (37 C)  Resp: 18     Body mass index is 24.55 kg/(m^2).    ECOG FS: 0  Filed Weights   07/06/13 0951  Weight: 140 lb 12.8 oz (63.866 kg)    Sclerae unicteric, pupils equal and round Oropharynx no thrush or other lesions No cervical or supraclavicular adenopathy  Lungs clear to auscultation with good excursion bilaterally Heart regular rate and rhythm, I did not note a murmur today Abdomen  soft, nontender, positive bowel sounds MSK no focal spinal tenderness  No upper extremity lymphedema Neuro: nonfocal, well oriented, positive affect Breasts:  The right breast is status post mastectomy and radiation. There is some hyperpigmentation over the radiation field, but no evidence of disease recurrence. The right axilla is benign. The left breast is unremarkable.  LAB RESULTS: CBC    Component Value Date/Time   WBC 4.9 06/29/2013 0839   WBC 5.7 03/02/2013 1230   RBC 4.41 06/29/2013 0839   RBC 4.19 03/02/2013 1230  HGB 14.0 06/29/2013 0839   HGB 13.9 03/02/2013 1230   HCT 42.3 06/29/2013 0839   HCT 41.3 03/02/2013 1230   PLT 203 06/29/2013 0839   PLT 226 03/02/2013 1230   MCV 96.0 06/29/2013 0839   MCV 98.6 03/02/2013 1230   MCH 31.7 06/29/2013 0839   MCH 33.2 03/02/2013 1230   MCHC 33.0 06/29/2013 0839   MCHC 33.7 03/02/2013 1230   RDW 15.4* 06/29/2013 0839   RDW 14.0 03/02/2013 1230   LYMPHSABS 0.9 06/29/2013 0839   LYMPHSABS 1.7 03/02/2013 1230   MONOABS 0.6 06/29/2013 0839   MONOABS 0.7 03/02/2013 1230   EOSABS 0.1 06/29/2013 0839   EOSABS 0.1 03/02/2013 1230   BASOSABS 0.0 06/29/2013 0839   BASOSABS 0.0 03/02/2013 1230    CMP     Component Value Date/Time   NA 145 06/29/2013 0839   NA 141 03/02/2013 1230   K 3.4* 06/29/2013 0839   K 3.8 03/02/2013 1230   CL 104 03/02/2013 1230   CL 107 01/11/2013 0853   CO2 28 06/29/2013 0839   CO2 24 03/02/2013 1230   GLUCOSE 110 06/29/2013 0839   GLUCOSE 92 03/02/2013 1230   GLUCOSE 123* 01/11/2013 0853   BUN 10.6 06/29/2013 0839   BUN 11 03/02/2013 1230   CREATININE 0.8 06/29/2013 0839   CREATININE 0.71 03/02/2013 1230   CALCIUM 9.5 06/29/2013 0839   CALCIUM 9.3 03/02/2013 1230   PROT 7.2 06/29/2013 0839   PROT 7.1  03/02/2013 1230   ALBUMIN 3.8 06/29/2013 0839   ALBUMIN 3.9 03/02/2013 1230   AST 19 06/29/2013 0839   AST 30 03/02/2013 1230   ALT 17 06/29/2013 0839   ALT 16 03/02/2013 1230   ALKPHOS 97 06/29/2013 0839   ALKPHOS 78 03/02/2013 1230   BILITOT 0.84 06/29/2013 0839   BILITOT 0.6 03/02/2013 1230   GFRNONAA 88* 03/02/2013 1230   GFRAA >90 03/02/2013 1230       STUDIES: Ct Chest W Contrast  06/29/2013   CLINICAL DATA:  Breast cancer with positive nodes. Patient status post mastectomy and radiation therapy as well as chemotherapy.  EXAM: CT CHEST WITH CONTRAST  TECHNIQUE: Multidetector CT imaging of the chest was performed during intravenous contrast administration.  CONTRAST:  80mL OMNIPAQUE IOHEXOL 300 MG/ML  SOLN  COMPARISON:  PET-CT scan 06/30/1999 14,  FINDINGS: Small fluid collection within the right axilla measuring 8. X 17 mm which extends to the lymphadenectomy clips and is felt to represent a benign postsurgical fluid collection. Patient status post right mastectomy. There is some skin thickening and tissues muscular thickening beneath the mastectomy site. There is no axillary lymphadenopathy or supraclavicular adenopathy. No for a clavicular adenopathy. No internal mammary adenopathy.  No mediastinal hilar lymphadenopathy.  No pericardial fluid.  Review of the lung parenchyma demonstrates a tiny peripheral 3 mm nodule in the right upper lobe (image 27).  There is no central pulmonary embolism. Limited view of the upper abdomen unremarkable. Limited view of the skeleton is unremarkable.  IMPRESSION: 1. No evidence of metastasis within the thorax. 2. Small right upper lobe pulmonary nodule. Recommend attention on follow-up. 3. Postsurgical and post therapy change in the right axilla and chest wall.   Electronically Signed   By: Genevive Bi M.D.   On: 06/29/2013 14:32   Nm Pet Image Restag (ps) Skull Base To Thigh  06/29/2013   CLINICAL DATA:  Subsequent treatment strategy for breast carcinoma. Status post  surgery and radiation.  EXAM: NUCLEAR MEDICINE PET SKULL  BASE TO THIGH  FASTING BLOOD GLUCOSE:  Value: 106mg /dl  TECHNIQUE: 81.1 mCi B-14 FDG was injected intravenously. CT data was obtained and used for attenuation correction and anatomic localization only. (This was not acquired as a diagnostic CT examination.) Additional exam technical data entered on technologist worksheet.  COMPARISON:  CT thorax 06/29/2013  FINDINGS: NECK  No hypermetabolic lymph nodes in the neck.  CHEST  There is mild metabolic activity associated with the right mastectomy and axillary dissection site consistent with postsurgical inflammation. There are no hypermetabolic axillary, supraclavicular or infraclavicular nodes. No hypermetabolic internal mammary lymph nodes. No suspicious pulmonary nodules. No hypermetabolic mediastinal lymph nodes.  ABDOMEN/PELVIS  No abnormal hypermetabolic activity within the liver, pancreas, adrenal glands, or spleen. No hypermetabolic lymph nodes in the abdomen or pelvis.  SKELETON  No focal hypermetabolic activity to suggest skeletal metastasis.  IMPRESSION: 1. No evidence of nodal metastasis with the thorax. 2. No evidence of distant metastasis. 3. Postsurgical and post radiotherapy hypermetabolic inflammation associated the right mastectomy and axillary dissection site.  1.   Electronically Signed   By: Genevive Bi M.D.   On: 06/29/2013 13:27     ASSESSMENT: 67 y.o. [place] woman   (1)  s/p Right breast upper inner quadrant and axillary node biopsy 03/08/2012 for a clinical T4, N1-2', stage IIIB invasive ductal carcinoma, grade 3, triple negative, with an MIB-1 of 93%.  (2) definitive staging and treatment delayed as patient canceled staging studies and tried alternative treatments; with eventual progression  (3) neoadjuvant chemotherapy started 08/23/12  (a) received two cycles of neoadjuvant cyclophosphamide, docetaxel, doxorubicin ["TAC"] at standard doses with neulasta support;  refused neulasta with cycles 2 and 4; tolerated treatment poorly  (b) docetaxel was held for final 4 cycles; completed cycle 6 neoadjuvant chemotherapy 12/27/2012    (4) status post right modified radical mastectomy 03/14/2023 showing no residual carcinoma in the breast, but nine of 9 axillary lymph nodes sampled involved with macro metastases (ypTX, ypN2, stage IIIA)-- repeat prognostic panel again triple negative  (5) radiation therapy completed 05/31/2013  (6) patient opted against reconstruction  (7) restaging PET scan and chest CT December 2014 entirely negative.  PLAN: Barrett is recovering nicely from her treatments, and there is no evidence of active cancer at present. She is already scheduled to see Dr. Derrell Lolling in February. She will see [place] again in may of 2015, with lab work and physical exam only.Marland Kitchen She will be due for repeat left mammography July of 2015. We are going to continue to see her every 3 months until November of 2015 at which time if all goes well we will start seeing her every 6 months.  She knows to call for any problems that may develop before the next visit here.  Perry Brucato C    07/06/2013

## 2013-07-10 NOTE — Progress Notes (Signed)
  Radiation Oncology         ([phone]) [phone] ________________________________  Name: Janice Powell  MRN: [id number]  Date: 04/18/2013  DOB: 1946-05-28  Simulation Verification Note  Status: outpatient  NARRATIVE: The patient was brought to the treatment unit and placed in the planned treatment position. The clinical setup was verified. Then port films were obtained and uploaded to the radiation oncology medical record software.  The treatment beams were carefully compared against the planned radiation fields. The position location and shape of the radiation fields was reviewed. They targeted volume of tissue appears to be appropriately covered by the radiation beams. Organs at risk appear to be excluded as planned.  Based on my personal review, I approved the simulation verification. The patient's treatment will proceed as planned.  -----------------------------------  Lonie Peak, MD

## 2013-07-29 ENCOUNTER — Other Ambulatory Visit: Payer: Self-pay | Admitting: Oncology

## 2013-07-29 DIAGNOSIS — I10 Essential (primary) hypertension: Secondary | ICD-10-CM

## 2013-07-29 DIAGNOSIS — C50919 Malignant neoplasm of unspecified site of unspecified female breast: Secondary | ICD-10-CM

## 2013-07-31 NOTE — Telephone Encounter (Signed)
Called patient in reference to th is refill request.  Awaiting return call from patient.

## 2013-08-01 ENCOUNTER — Telehealth: Payer: Self-pay

## 2013-08-01 NOTE — Telephone Encounter (Signed)
Spoke with patient's husband and he will inform of follow up appointment with Dr.Wentworth scheduled for Friday 08/04/13 at 1:20 pm , to arrive at 1:00pm to register.

## 2013-08-02 ENCOUNTER — Encounter: Payer: Self-pay | Admitting: Radiation Oncology

## 2013-08-04 ENCOUNTER — Encounter: Payer: Self-pay | Admitting: Radiation Oncology

## 2013-08-04 ENCOUNTER — Ambulatory Visit
Admission: RE | Admit: 2013-08-04 | Discharge: 2013-08-04 | Disposition: A | Discharge status: Home / Self Care | Payer: Medicare Other | Source: Ambulatory Visit | Attending: Radiation Oncology | Admitting: Radiation Oncology

## 2013-08-04 VITALS — BP 172/83 | HR 78 | Temp 98.1°F | Resp 20 | Wt 140.7 lb

## 2013-08-04 DIAGNOSIS — C50211 Malignant neoplasm of upper-inner quadrant of right female breast: Secondary | ICD-10-CM

## 2013-08-04 HISTORY — DX: Personal history of antineoplastic chemotherapy: Z92.21

## 2013-08-04 NOTE — Progress Notes (Signed)
Pt denies pain, fatigue, loss of appetite. She had ct chest, PET scan in Dec 2014 and saw Dr Jana Hakim. She is due for mammogram July 2015.

## 2013-08-04 NOTE — Progress Notes (Signed)
   Department of Radiation Oncology  Phone:  [phone] Fax:        [phone]   Name: Janice Powell MRN: [id number]  DOB: [birth year]  Date: 08/04/2013  Follow Up Visit Note  Diagnosis: Right breast cancer   Summary and Interval since last radiation: 60 Gy to the right chest wall completed 05/31/13  Interval History: Janice Powell presents today for routine followup.  She is about 2 months out from her treatments. She is healing well. She had a CT of the chest a PET scan in December and saw Dr. Randell Patient not. She is due for a left-sided mammogram in July. She her skin is healed up well. She has no complaints today. She is continuing to use Radiaplex on her skin. She was triple negative so she is non-antiestrogen therapy.  Allergies: No Known Allergies  Medications:  Current Outpatient Prescriptions  Medication Sig Dispense Refill  . lisinopril (PRINIVIL,ZESTRIL) 10 MG tablet TAKE 1 TABLET EVERY DAY  90 tablet  1  . metoprolol succinate (TOPROL XL) 50 MG 24 hr tablet Take 1 tablet (50 mg total) by mouth daily. Take with or immediately following a meal.  90 tablet  0  . UNABLE TO FIND Rx: B5597- Mastectomy Form, RT (Quantity: 1) L8000- Mastectomy Bra (Quantity: 3) Dx: 174.9; Right mastectomy  1 each  0  . UNABLE TO FIND Rx: L8000- Post Surgical Bras (Quantity: 6) C1638- Silicone Breast Prosthesis (Quantity: 1) Dx: 174.9; Right mastectomy  1 each  0   No current facility-administered medications for this encounter.    Physical Exam:  Filed Vitals:   08/04/13 1335  BP: 172/83  Pulse: 78  Temp: 98.1 F (36.7 C)  TempSrc: Oral  Resp: 20  Weight: 140 lb 11.2 oz (63.821 kg)   she has hyperpigmentation of the right chest wall. No visible or palpable evidence of tumor recurrence. She is alert minus x3.  IMPRESSION: [name] is a 68 y.o. female status post right chest wall radiation with resolving acute effects of treatment and residual hyperpigmentation  PLAN:  I encouraged her to start  using lotion with vitamin E to help with the skin darkening. She has followup with medical oncology. We discussed sun protection in the treated area. I released her from followup with radiation oncology. I be happy to see her back on an as-needed basis.    Thea Silversmith, MD

## 2013-08-06 ENCOUNTER — Ambulatory Visit: Payer: Medicare Other | Admitting: Radiation Oncology

## 2013-10-05 ENCOUNTER — Ambulatory Visit (INDEPENDENT_AMBULATORY_CARE_PROVIDER_SITE_OTHER): Payer: Medicare Other | Admitting: General Surgery

## 2013-10-05 ENCOUNTER — Encounter (INDEPENDENT_AMBULATORY_CARE_PROVIDER_SITE_OTHER): Payer: Self-pay | Admitting: General Surgery

## 2013-10-05 VITALS — BP 132/86 | HR 66 | Temp 98.2°F | Resp 12 | Ht 63.0 in | Wt 142.8 lb

## 2013-10-05 DIAGNOSIS — C50419 Malignant neoplasm of upper-outer quadrant of unspecified female breast: Secondary | ICD-10-CM

## 2013-10-05 DIAGNOSIS — C50411 Malignant neoplasm of upper-outer quadrant of right female breast: Secondary | ICD-10-CM

## 2013-10-05 NOTE — Progress Notes (Signed)
Patient ID: Janice Powell, female   DOB: 1946/02/19, 68 y.o.   MRN: [id number] History:  This patient underwent right modified radical mastectomy and removal of Port-A-Cath on 03/13/2013. Final pathology report showed complete pathologic response in the breast, however 9 out of 9 lymph nodes were positive for metastatic disease. She completed radiation therapy. She received physical therapy but is not doing any more. She still has some limitation range of motion right shoulder. Historically her presentation is as follows:  This patient noticed a right breast mass in 2011. Diagnostic mammography and right ultrasonography at [facility] 03/02/2012 showed a 6.7 cm irregular mass in the inner aspect of the right breast. There was nipple retraction and erythema around the nipple. A large lymph node was noted in the right axilla. By ultrasound the large irregular hypoechoic mass was noted in the breast with multiple enlarged axillary lymph nodes. Physical exam confirmed a hard breast with erythema around the right nipple extending inferiorly..  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  She initially declined chemotherapy, but reconsidered and I placed a Port-A-Cath on 08/15/2012.  She received neoadjuvant chemotherapy..  MRI performed on 01/19/2013 showed good response in the right breast with minimal enhancement. The level I lymph nodes are much smaller and are basically normal. Otherwise there are no abnormal findings on MRI.  Final pathology report shows a complete pathologic response in the breast, but 9 out of 9 axillary nodes are positive for metastatic disease.  She continues to follow with Dr. Jana Hakim.  Exam:  Patient looks well. No distress  Right mastectomy wound looks good. Skin is healthy. Skin flaps are stuck down very well. No sign of infection or fluid.. Chronic radiation changes noted. Range of motion right  shoulder reveals that she can abduct the right shoulder about 155.  Neck reveals no adenopathy or mass. Lungs clear to auscultation bilaterally Heart regular rate and rhythm. No ectopy. No murmur.  Assessment:  Locally advanced cancer right breast, initial clinical stage T4, N2, stage IIIB, triple negative breast cancer  Status post neoadjuvant chemotherapy initiated 08/23/2012  Status post right modified radical mastectomy and removal of Port-A-Cath on 03/13/2013 revealing no residual carcinoma in the breast but 9/9 lymph node sampled involving macro metastasis, ypTX, ypN2, stage IIIA  She has recovered uneventfully from her surgery.  There is no evidence of local recurrence  Plan:  I offered to refer her back to physical therapy for her right shoulder, and she declined. She says she's going to go to the [facility].  Left breast mammogram in June 2015.  Return to see me in 1 year     Varonica Siharath M. Dalbert Batman, M.D., [facility]  General and Minimally invasive Surgery  Breast and Colorectal Surgery  Office: [phone]  Pager: [phone]

## 2013-10-05 NOTE — Patient Instructions (Signed)
Examination of your breasts and all the lymph node areas is normal. There is no evidence of cancer.  Let us know if you change her mind about physical therapy  Schedule left breast mammogram in June of this year  Return to see Dr. Dalbert Batman in one year.

## 2013-10-27 ENCOUNTER — Other Ambulatory Visit: Payer: Self-pay | Admitting: Oncology

## 2013-10-27 DIAGNOSIS — C50419 Malignant neoplasm of upper-outer quadrant of unspecified female breast: Secondary | ICD-10-CM

## 2013-12-07 ENCOUNTER — Ambulatory Visit (HOSPITAL_BASED_OUTPATIENT_CLINIC_OR_DEPARTMENT_OTHER): Payer: Medicare Other | Admitting: Physician Assistant

## 2013-12-07 ENCOUNTER — Encounter: Payer: Self-pay | Admitting: Physician Assistant

## 2013-12-07 ENCOUNTER — Other Ambulatory Visit (HOSPITAL_BASED_OUTPATIENT_CLINIC_OR_DEPARTMENT_OTHER): Payer: Medicare Other

## 2013-12-07 VITALS — BP 215/104 | HR 67 | Temp 98.6°F | Resp 18 | Ht 63.0 in | Wt 146.2 lb

## 2013-12-07 DIAGNOSIS — Z171 Estrogen receptor negative status [ER-]: Secondary | ICD-10-CM

## 2013-12-07 DIAGNOSIS — C773 Secondary and unspecified malignant neoplasm of axilla and upper limb lymph nodes: Secondary | ICD-10-CM | POA: Diagnosis not present

## 2013-12-07 DIAGNOSIS — C50411 Malignant neoplasm of upper-outer quadrant of right female breast: Secondary | ICD-10-CM

## 2013-12-07 DIAGNOSIS — Z1231 Encounter for screening mammogram for malignant neoplasm of breast: Secondary | ICD-10-CM

## 2013-12-07 DIAGNOSIS — C50319 Malignant neoplasm of lower-inner quadrant of unspecified female breast: Secondary | ICD-10-CM

## 2013-12-07 DIAGNOSIS — I10 Essential (primary) hypertension: Secondary | ICD-10-CM | POA: Diagnosis not present

## 2013-12-07 LAB — CBC WITH DIFFERENTIAL/PLATELET
BASO%: 0.5 % (ref 0.0–2.0)
Basophils Absolute: 0 10*3/uL (ref 0.0–0.1)
EOS%: 1 % (ref 0.0–7.0)
Eosinophils Absolute: 0 10*3/uL (ref 0.0–0.5)
HEMATOCRIT: 41.4 % (ref 34.8–46.6)
HGB: 13.7 g/dL (ref 11.6–15.9)
LYMPH#: 1.1 10*3/uL (ref 0.9–3.3)
LYMPH%: 28.2 % (ref 14.0–49.7)
MCH: 32.4 pg (ref 25.1–34.0)
MCHC: 33.1 g/dL (ref 31.5–36.0)
MCV: 97.9 fL (ref 79.5–101.0)
MONO#: 0.5 10*3/uL (ref 0.1–0.9)
MONO%: 12 % (ref 0.0–14.0)
NEUT#: 2.4 10*3/uL (ref 1.5–6.5)
NEUT%: 58.3 % (ref 38.4–76.8)
PLATELETS: 212 10*3/uL (ref 145–400)
RBC: 4.23 10*6/uL (ref 3.70–5.45)
RDW: 14.3 % (ref 11.2–14.5)
WBC: 4.1 10*3/uL (ref 3.9–10.3)

## 2013-12-07 LAB — COMPREHENSIVE METABOLIC PANEL (CC13)
ALT: 22 U/L (ref 0–55)
AST: 19 U/L (ref 5–34)
Albumin: 3.7 g/dL (ref 3.5–5.0)
Alkaline Phosphatase: 84 U/L (ref 40–150)
Anion Gap: 12 mEq/L — ABNORMAL HIGH (ref 3–11)
BILIRUBIN TOTAL: 0.56 mg/dL (ref 0.20–1.20)
BUN: 8.4 mg/dL (ref 7.0–26.0)
CO2: 26 meq/L (ref 22–29)
CREATININE: 0.8 mg/dL (ref 0.6–1.1)
Calcium: 9.2 mg/dL (ref 8.4–10.4)
Chloride: 107 mEq/L (ref 98–109)
Glucose: 131 mg/dl (ref 70–140)
Potassium: 3.3 mEq/L — ABNORMAL LOW (ref 3.5–5.1)
Sodium: 144 mEq/L (ref 136–145)
TOTAL PROTEIN: 6.6 g/dL (ref 6.4–8.3)

## 2013-12-07 NOTE — Progress Notes (Signed)
Patient ID: Janice Powell, female   DOB: April 01, 1946, 68 y.o.   MRN: [id number] ID: [name]   DOB: 08-Aug-1945  MR#: [id number]  CSN#:[id number]  PCP: Horatio Pel, MD GYN:  SU: [name] OTHER MD: Eppie Gibson, MD  CHIEF COMPLAINT:  Hx of Triple Negative Right Breast Cancer   HISTORY OF PRESENT ILLNESS: [name] noted a mass in her right breast sometime in 2011. 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 More recently her husband twisted her arm to get the mass looked at, and she brought it to Dr. Pennie Banter attention. Diagnostic mammography and right ultrasonography at [facility] 03/02/2012 showed a 6.78 cm irregular mass in the inner aspect of the right breast. There was nipple retraction and erythema around the nipple. The largest lymph node was noted in the right axilla. By ultrasound the large irregular hypoechoic mass was noted in the breast with multiple enlarged axillary lymph nodes. Physical exam confirmed a hard breast with erythema around the right nipple extending inferiorly. The nipple is slightly inverted.  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   Her subsequent history is as detailed below  INTERVAL HISTORY: Janice Powell returns alone today for routine three-month follow up of her triple negative right breast cancer. Interval history is generally unremarkable, and she is feeling well with no new complaints today. She feels like she is "back to normal" after having completed all of her therapy last November. She is due for left mammogram.   REVIEW OF SYSTEMS: Janice Powell has had no recent illnesses and denies any recent fevers, chills, or night sweats. She has no significant hot flashes. She's had no rashes, skin changes, abnormal bruising, or abnormal bleeding. Her energy level is good, as is her appetite. She denies any nausea or emesis and has  had no change in bowel or bladder habits. She denies any cough, shortness of breath, chest pain, palpitations, or peripheral swelling. She denies any abnormal headaches or dizziness, and also denies any current myalgias, arthralgias, or bony pain.  A detailed view of systems otherwise stable and noncontributory.   PAST MEDICAL HISTORY: Past Medical History  Diagnosis Date  . Hypertension   . Pneumonia     hx  . GERD (gastroesophageal reflux disease)     occ  . Osteopenia   . Breast cancer 03/08/13    right breast - Invasive Ductal Carcinoma   . S/P radiation therapy     1) Right Chest Wall and IM nodes / 50 Gy in 25 fractions /2) Right Supraclavicular fossa / 50 Gy in 25 fractions/1) Right Chest Wall and IM nodes / 50 Gy in 25 fractions /2) Right Supraclavicular fossa / 50 Gy in 25 fractions/3) Right Posterior Axillary boost / 6.55 Gy in 25 fractions/4) Right Chest Wall Scar boost / 10 Gy in 5 fractions      . Hx antineoplastic chemotherapy     two cycles of neoadjuvant cyclophosphamide, docetaxel, doxorubicin ["TAC"] at standard doses with neulasta support; refused neulasta with cycles 2 and 4; tolerated treatment poorly (b) docetaxel was held for final 4 cycles; completed cycle 6 neoadjuvant chemotherapy 12/27/2012    GERD osteopenia  PAST SURGICAL HISTORY: Past Surgical History  Procedure Laterality Date  . Appendectomy  1992  . Tubal ligation  1979  . Cholecystectomy  2000  . Portacath placement  08/15/2012    Procedure: INSERTION PORT-A-CATH;  Surgeon: [name], MD;  Location: [place];  Service: General;  Laterality: Left;  Marland Kitchen Mastectomy Right 03/13/2013  . Breast biopsy Right   . Port-a-cath removal  03/13/2013  . Mastectomy modified radical Right 03/13/2013    Procedure: MASTECTOMY MODIFIED RADICAL;  Surgeon: [name], MD;  Location: [place];  Service: General;  Laterality: Right;  . Port-a-cath removal Left 03/13/2013    Procedure: REMOVAL PORT-A-CATH;  Surgeon:  [name], MD;  Location: [place];  Service: General;  Laterality: Left;    FAMILY HISTORY Family History  Problem Relation Age of Onset  . Heart disease Mother    the patient's father died in his sleep at age 67. The patient's mother died at the age of 13 from a myocardial infarction. The patient has one brother and 2 sisters, all surviving. There is no history of breast or ovarian cancer in the family.  GYNECOLOGIC HISTORY:  (Reviewed 12/07/2013) Menarche age 89, first live birth age 72, she is GX P5, menopause age 24. She did not take hormone replacement.  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 Her husband [name] worked in the post office 51 years, and is also now retired. He is a former Company secretary. Son [name] lives in [place] and manages an [employer] store. Daughter Jaydan Chretien also lives in [place] and manages the IT Department at [employer]. Son Alyia Lacerte III is a Engineer, materials. Daughter [name] is a Actuary for [employer]. Son [name] is an Actuary. The patient has 8 grandchildren. She attends the white [employer]   ADVANCED DIRECTIVES: Not in place  HEALTH MAINTENANCE:  (Updated 12/07/2013) History  Substance Use Topics  . Smoking status: Never Smoker   . Smokeless tobacco: Never Used  . Alcohol Use: No     Colonoscopy: Not on file  PAP: Not on file  Bone density:  January 2011   Lipid panel:  Not on file/ Dr. Shelia Media  No Known Allergies  Current Outpatient Prescriptions  Medication Sig Dispense Refill  . lisinopril (PRINIVIL,ZESTRIL) 10 MG tablet TAKE 1 TABLET EVERY DAY  90 tablet  1  . metoprolol succinate (TOPROL-XL) 50 MG 24 hr tablet TAKE 1 TABLET BY MOUTH ONCE DAILY *TAKE WITH OR IMMEDIATELY FOLLOWING A MEAL*  90 tablet  0  . UNABLE TO FIND Rx: F0932- Mastectomy Form, RT (Quantity: 1) L8000- Mastectomy Bra (Quantity: 3) Dx: 174.9; Right mastectomy  1 each   0  . UNABLE TO FIND Rx: L8000- Post Surgical Bras (Quantity: 6) T5573- Silicone Breast Prosthesis (Quantity: 1) Dx: 174.9; Right mastectomy  1 each  0   No current facility-administered medications for this visit.    OBJECTIVE: Middle-aged [place] American woman who appears well Filed Vitals:   12/07/13 0847  BP: 215/104  Pulse: 67  Temp: 98.6 F (37 C)  Resp: 18     Body mass index is 25.9 kg/(m^2).    ECOG FS: 0  Filed Weights   12/07/13 0847  Weight: 146 lb 3.2 oz (66.316 kg)   Physical Exam: HEENT:  Sclerae anicteric.  Oropharynx clear and moist. Neck supple, trachea midline.  NODES:  No cervical or supraclavicular lymphadenopathy palpated.  BREAST EXAM: Patient is status post right mastectomy. Thereis nodularities or skin changes in the chest wall. No evidence of local recurrence. Left breast is unremarkable. Axillae are benign bilaterally, no palpable lymphadenopathy. LUNGS:  Clear to auscultation  bilaterally.  No wheezes or rhonchi HEART:  Regular rate and rhythm. No murmur appreciated. ABDOMEN:  Soft, nontender to palpation.  Positive bowel sounds.  MSK:  No focal spinal tenderness to palpation. Good range of motion bilaterally in the upper extremities. EXTREMITIES:  No peripheral edema.  No lymphedema noted in the right upper extremity. SKIN:  No visible rashes. No excessive ecchymoses. No petechiae. No pallor. Skin turgor. NEURO:  Nonfocal. Well oriented.  Appropriate affect.   LAB RESULTS: CBC    Component Value Date/Time   WBC 4.1 12/07/2013 0834   WBC 5.7 03/02/2013 1230   RBC 4.23 12/07/2013 0834   RBC 4.19 03/02/2013 1230   HGB 13.7 12/07/2013 0834   HGB 13.9 03/02/2013 1230   HCT 41.4 12/07/2013 0834   HCT 41.3 03/02/2013 1230   PLT 212 12/07/2013 0834   PLT 226 03/02/2013 1230   MCV 97.9 12/07/2013 0834   MCV 98.6 03/02/2013 1230   MCH 32.4 12/07/2013 0834   MCH 33.2 03/02/2013 1230   MCHC 33.1 12/07/2013 0834   MCHC 33.7 03/02/2013 1230   RDW 14.3 12/07/2013 0834   RDW  14.0 03/02/2013 1230   LYMPHSABS 1.1 12/07/2013 0834   LYMPHSABS 1.7 03/02/2013 1230   MONOABS 0.5 12/07/2013 0834   MONOABS 0.7 03/02/2013 1230   EOSABS 0.0 12/07/2013 0834   EOSABS 0.1 03/02/2013 1230   BASOSABS 0.0 12/07/2013 0834   BASOSABS 0.0 03/02/2013 1230    CMP     Component Value Date/Time   NA 144 12/07/2013 0834   NA 141 03/02/2013 1230   K 3.3* 12/07/2013 0834   K 3.8 03/02/2013 1230   CL 104 03/02/2013 1230   CL 107 01/11/2013 0853   CO2 26 12/07/2013 0834   CO2 24 03/02/2013 1230   GLUCOSE 131 12/07/2013 0834   GLUCOSE 92 03/02/2013 1230   GLUCOSE 123* 01/11/2013 0853   BUN 8.4 12/07/2013 0834   BUN 11 03/02/2013 1230   CREATININE 0.8 12/07/2013 0834   CREATININE 0.71 03/02/2013 1230   CALCIUM 9.2 12/07/2013 0834   CALCIUM 9.3 03/02/2013 1230   PROT 6.6 12/07/2013 0834   PROT 7.1 03/02/2013 1230   ALBUMIN 3.7 12/07/2013 0834   ALBUMIN 3.9 03/02/2013 1230   AST 19 12/07/2013 0834   AST 30 03/02/2013 1230   ALT 22 12/07/2013 0834   ALT 16 03/02/2013 1230   ALKPHOS 84 12/07/2013 0834   ALKPHOS 78 03/02/2013 1230   BILITOT 0.56 12/07/2013 0834   BILITOT 0.6 03/02/2013 1230   GFRNONAA 88* 03/02/2013 1230   GFRAA >90 03/02/2013 1230       STUDIES: Ct Chest W Contrast  06/29/2013   CLINICAL DATA:  Breast cancer with positive nodes. Patient status post mastectomy and radiation therapy as well as chemotherapy.  EXAM: CT CHEST WITH CONTRAST  TECHNIQUE: Multidetector CT imaging of the chest was performed during intravenous contrast administration.  CONTRAST:  62mL OMNIPAQUE IOHEXOL 300 MG/ML  SOLN  COMPARISON:  PET-CT scan 06/30/1999 14,  FINDINGS: Small fluid collection within the right axilla measuring 8. X 17 mm which extends to the lymphadenectomy clips and is felt to represent a benign postsurgical fluid collection. Patient status post right mastectomy. There is some skin thickening and tissues muscular thickening beneath the mastectomy site. There is no axillary lymphadenopathy or supraclavicular adenopathy. No  for a clavicular adenopathy. No internal mammary adenopathy.  No mediastinal hilar lymphadenopathy.  No pericardial fluid.  Review of the lung parenchyma demonstrates a tiny peripheral  3 mm nodule in the right upper lobe (image 27).  There is no central pulmonary embolism. Limited view of the upper abdomen unremarkable. Limited view of the skeleton is unremarkable.  IMPRESSION: 1. No evidence of metastasis within the thorax. 2. Small right upper lobe pulmonary nodule. Recommend attention on follow-up. 3. Postsurgical and post therapy change in the right axilla and chest wall.   Electronically Signed   By: Suzy Bouchard M.D.   On: 06/29/2013 14:32   Nm Pet Image Restag (ps) Skull Base To Thigh  06/29/2013   CLINICAL DATA:  Subsequent treatment strategy for breast carcinoma. Status post surgery and radiation.  EXAM: NUCLEAR MEDICINE PET SKULL BASE TO THIGH  FASTING BLOOD GLUCOSE:  Value: 106mg /dl  TECHNIQUE: 17.4 mCi F-18 FDG was injected intravenously. CT data was obtained and used for attenuation correction and anatomic localization only. (This was not acquired as a diagnostic CT examination.) Additional exam technical data entered on technologist worksheet.  COMPARISON:  CT thorax 06/29/2013  FINDINGS: NECK  No hypermetabolic lymph nodes in the neck.  CHEST  There is mild metabolic activity associated with the right mastectomy and axillary dissection site consistent with postsurgical inflammation. There are no hypermetabolic axillary, supraclavicular or infraclavicular nodes. No hypermetabolic internal mammary lymph nodes. No suspicious pulmonary nodules. No hypermetabolic mediastinal lymph nodes.  ABDOMEN/PELVIS  No abnormal hypermetabolic activity within the liver, pancreas, adrenal glands, or spleen. No hypermetabolic lymph nodes in the abdomen or pelvis.  SKELETON  No focal hypermetabolic activity to suggest skeletal metastasis.  IMPRESSION: 1. No evidence of nodal metastasis with the thorax. 2. No  evidence of distant metastasis. 3. Postsurgical and post radiotherapy hypermetabolic inflammation associated the right mastectomy and axillary dissection site.  1.   Electronically Signed   By: Suzy Bouchard M.D.   On: 06/29/2013 13:27     ASSESSMENT: 68 y.o. [place] woman   (1)  s/p Right breast upper inner quadrant and axillary node biopsy 03/08/2012 for a clinical T4, N1-2', stage IIIB invasive ductal carcinoma, grade 3, triple negative, with an MIB-1 of 93%.  (2) definitive staging and treatment delayed as patient canceled staging studies and tried alternative treatments; with eventual progression  (3) neoadjuvant chemotherapy started 08/23/12  (a) received two cycles of neoadjuvant cyclophosphamide, docetaxel, doxorubicin ["TAC"] at standard doses with neulasta support; refused neulasta with cycles 2 and 4; tolerated treatment poorly  (b) docetaxel was held for final 4 cycles; completed cycle 6 neoadjuvant chemotherapy 12/27/2012    (4) status post right modified radical mastectomy 03/14/2023 showing no residual carcinoma in the breast, but nine of 9 axillary lymph nodes sampled involved with macro metastases (ypTX, ypN2, stage IIIA)-- repeat prognostic panel again triple negative  (5) radiation therapy completed 05/31/2013  (6) patient opted against reconstruction  (7) restaging PET scan and chest CT December 2014 entirely negative as detailed above.  PLAN: Janice Powell appears to be doing very well with regards to her breast cancer, and has recovered well from her therapy. We will continue to follow her very closely on a every 3 month basis through November of this year, and if all is well, we'll likely begin q. 6 month followups at that time. She is due for her left mammogram and that will be scheduled for her for their next available appointment. In the meanwhile, she'll continue to be followed by her primary care physician, Dr. Shelia Media, especially with regards to her long-standing  hypertension.  The above plan was reviewed with the patient today. She voices  her understanding and agreement with our plan, and as always knows to call with any changes or problems prior to her next appointment here in August.   Theotis Burrow  PA-C   12/07/2013

## 2013-12-08 ENCOUNTER — Telehealth: Payer: Self-pay | Admitting: Oncology

## 2013-12-08 ENCOUNTER — Telehealth: Payer: Self-pay | Admitting: *Deleted

## 2013-12-08 NOTE — Telephone Encounter (Signed)
Called pt to inform her of potassium level(3.3). Pt admitted she had not been taking potassium consistently. I asked pt was she on 44mEq tablet or 65mEq. She was not at home at time to look at her medication bottle. I told her I would call in Potassium to her pharmacy, she said she had plenty. I advised her to take 20 mEq total. Explained to her if she has 10 mEq tablets to take two daily and if she has 17mEq tablet to take one a daily. The goal is for her to take 66mEq daily. Pt verbalized understanding. I also talked with her about F/U with PCP to monitor HTN. Pt agreed and said she would make an appt. Message to be forwarded to [employer]. PA-C.

## 2013-12-08 NOTE — Telephone Encounter (Signed)
per pof to sch mamma & appt-cld & left pt a message of sch time-will mail out sch

## 2013-12-25 ENCOUNTER — Ambulatory Visit
Admission: RE | Admit: 2013-12-25 | Discharge: 2013-12-25 | Disposition: A | Discharge status: Home / Self Care | Payer: Medicare Other | Source: Ambulatory Visit | Attending: Physician Assistant | Admitting: Physician Assistant

## 2013-12-25 DIAGNOSIS — Z1231 Encounter for screening mammogram for malignant neoplasm of breast: Secondary | ICD-10-CM | POA: Diagnosis not present

## 2013-12-25 DIAGNOSIS — Z853 Personal history of malignant neoplasm of breast: Secondary | ICD-10-CM | POA: Diagnosis not present

## 2014-01-03 DIAGNOSIS — H2510 Age-related nuclear cataract, unspecified eye: Secondary | ICD-10-CM | POA: Diagnosis not present

## 2014-01-03 DIAGNOSIS — H25019 Cortical age-related cataract, unspecified eye: Secondary | ICD-10-CM | POA: Diagnosis not present

## 2014-01-11 ENCOUNTER — Other Ambulatory Visit: Payer: Self-pay | Admitting: Physician Assistant

## 2014-01-11 DIAGNOSIS — R928 Other abnormal and inconclusive findings on diagnostic imaging of breast: Secondary | ICD-10-CM

## 2014-01-17 ENCOUNTER — Other Ambulatory Visit: Payer: Medicare Other

## 2014-01-25 ENCOUNTER — Other Ambulatory Visit: Payer: Self-pay | Admitting: Oncology

## 2014-03-08 ENCOUNTER — Telehealth: Payer: Self-pay

## 2014-03-08 NOTE — Telephone Encounter (Signed)
Called pt in response to [facility].  Pt states she is aware of recommendation for ultrasound, that she has spoken with [facility] and does not want the additional exposure to radiation.  Patient believes deodarent caused the problem.  She refuses to have additional studies done. Offered to send patient copy of report - she states she already has one.  Returned [facility] center form by mail with patient response.  Copy sent to scan.

## 2014-03-09 ENCOUNTER — Other Ambulatory Visit: Payer: Self-pay

## 2014-03-09 DIAGNOSIS — C50411 Malignant neoplasm of upper-outer quadrant of right female breast: Secondary | ICD-10-CM

## 2014-03-12 ENCOUNTER — Telehealth: Payer: Self-pay | Admitting: Oncology

## 2014-03-12 ENCOUNTER — Other Ambulatory Visit (HOSPITAL_BASED_OUTPATIENT_CLINIC_OR_DEPARTMENT_OTHER): Payer: Medicare Other

## 2014-03-12 ENCOUNTER — Ambulatory Visit (HOSPITAL_BASED_OUTPATIENT_CLINIC_OR_DEPARTMENT_OTHER): Payer: Medicare Other | Admitting: Oncology

## 2014-03-12 VITALS — BP 222/71 | HR 61 | Temp 98.3°F | Resp 18 | Ht 63.0 in | Wt 148.4 lb

## 2014-03-12 DIAGNOSIS — C50319 Malignant neoplasm of lower-inner quadrant of unspecified female breast: Secondary | ICD-10-CM

## 2014-03-12 DIAGNOSIS — C50411 Malignant neoplasm of upper-outer quadrant of right female breast: Secondary | ICD-10-CM

## 2014-03-12 DIAGNOSIS — C773 Secondary and unspecified malignant neoplasm of axilla and upper limb lymph nodes: Secondary | ICD-10-CM | POA: Diagnosis not present

## 2014-03-12 LAB — COMPREHENSIVE METABOLIC PANEL (CC13)
ALT: 16 U/L (ref 0–55)
AST: 17 U/L (ref 5–34)
Albumin: 3.7 g/dL (ref 3.5–5.0)
Alkaline Phosphatase: 74 U/L (ref 40–150)
Anion Gap: 9 mEq/L (ref 3–11)
BILIRUBIN TOTAL: 0.52 mg/dL (ref 0.20–1.20)
BUN: 8.8 mg/dL (ref 7.0–26.0)
CALCIUM: 9.2 mg/dL (ref 8.4–10.4)
CO2: 28 mEq/L (ref 22–29)
CREATININE: 0.8 mg/dL (ref 0.6–1.1)
Chloride: 105 mEq/L (ref 98–109)
Glucose: 97 mg/dl (ref 70–140)
Potassium: 3.1 mEq/L — ABNORMAL LOW (ref 3.5–5.1)
Sodium: 142 mEq/L (ref 136–145)
Total Protein: 7 g/dL (ref 6.4–8.3)

## 2014-03-12 LAB — CBC WITH DIFFERENTIAL/PLATELET
BASO%: 0.3 % (ref 0.0–2.0)
BASOS ABS: 0 10*3/uL (ref 0.0–0.1)
EOS%: 0.5 % (ref 0.0–7.0)
Eosinophils Absolute: 0 10*3/uL (ref 0.0–0.5)
HEMATOCRIT: 41.2 % (ref 34.8–46.6)
HEMOGLOBIN: 13.5 g/dL (ref 11.6–15.9)
LYMPH%: 30.7 % (ref 14.0–49.7)
MCH: 32.4 pg (ref 25.1–34.0)
MCHC: 32.7 g/dL (ref 31.5–36.0)
MCV: 98.9 fL (ref 79.5–101.0)
MONO#: 0.7 10*3/uL (ref 0.1–0.9)
MONO%: 11.7 % (ref 0.0–14.0)
NEUT#: 3.2 10*3/uL (ref 1.5–6.5)
NEUT%: 56.8 % (ref 38.4–76.8)
PLATELETS: 224 10*3/uL (ref 145–400)
RBC: 4.16 10*6/uL (ref 3.70–5.45)
RDW: 14.3 % (ref 11.2–14.5)
WBC: 5.7 10*3/uL (ref 3.9–10.3)
lymph#: 1.7 10*3/uL (ref 0.9–3.3)

## 2014-03-12 NOTE — Progress Notes (Signed)
Patient ID: Janice Powell, female   DOB: Sep 05, 1945, 68 y.o.   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 More recently her husband twisted her arm to get the mass looked at, and she brought it to Dr. Pennie Banter attention. Diagnostic mammography and right ultrasonography at [facility] 03/02/2012 showed a 6.78 cm irregular mass in the inner aspect of the right breast. There was nipple retraction and erythema around the nipple. The largest lymph node was noted in the right axilla. By ultrasound the large irregular hypoechoic mass was noted in the breast with multiple enlarged axillary lymph nodes. Physical exam confirmed a hard breast with erythema around the right nipple extending inferiorly. The nipple is slightly inverted.  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   Her subsequent history is as detailed below  INTERVAL HISTORY: Janice Powell returns today for followup of her breast cancer. "Everything is fine. I'm here to she wanted to come every 3 months". When she had her mammogram in June Dr. Joneen Caraway noted a possible lymph node in the left axilla and the patient was recalled for further views but she refused. She explained to me that she thinks that all problem is that she was wearing deodorant at the time and they have not told her not to wear deodorant and that's why the skin looked a little bit thicker.  REVIEW OF SYSTEMS: Janice Powell tells me since her husband's brother died suddenly about a month ago, from an apparent heart attack, she has had some pain in the  right popliteal fossa. Interestingly her husband also has had some knee pain at the same time. Otherwise a detailed review of systems today was entirely negative. She walks about twice a week for exercise. She does have an outside bike which she just had repaired much which she has not yet started using   PAST MEDICAL HISTORY: Past Medical History  Diagnosis Date  . Hypertension   . Pneumonia     hx  . GERD (gastroesophageal reflux disease)     occ  . Osteopenia   . Breast cancer 03/08/13    right breast - Invasive Ductal Carcinoma   . S/P radiation therapy     1) Right Chest Wall and IM nodes / 50 Gy in 25 fractions /2) Right Supraclavicular fossa / 50 Gy in 25 fractions/1) Right Chest Wall and IM nodes / 50 Gy in 25 fractions /2) Right Supraclavicular fossa / 50 Gy in 25 fractions/3) Right Posterior Axillary boost / 6.55 Gy in 25 fractions/4) Right Chest Wall Scar boost / 10 Gy in 5 fractions      . Hx antineoplastic chemotherapy     two cycles of neoadjuvant cyclophosphamide, docetaxel, doxorubicin ["TAC"] at standard doses with neulasta support; refused neulasta with cycles 2 and 4; tolerated treatment poorly (b) docetaxel was held for final 4 cycles; completed cycle 6 neoadjuvant chemotherapy 12/27/2012    GERD osteopenia  PAST SURGICAL HISTORY: Past Surgical History  Procedure Laterality Date  . Appendectomy  1992  . Tubal ligation  1979  .  Cholecystectomy  2000  . Portacath placement  08/15/2012    Procedure: INSERTION PORT-A-CATH;  Surgeon: [name], MD;  Location: [place];  Service: General;  Laterality: Left;  Marland Kitchen Mastectomy Right 03/13/2013  . Breast biopsy Right   . Port-a-cath removal  03/13/2013  . Mastectomy modified radical Right 03/13/2013    Procedure: MASTECTOMY MODIFIED RADICAL;  Surgeon: [name], MD;  Location: [place];  Service: General;  Laterality: Right;  . Port-a-cath removal Left 03/13/2013    Procedure: REMOVAL PORT-A-CATH;  Surgeon: [name], MD;  Location: [place];  Service: General;  Laterality: Left;    FAMILY HISTORY Family History  Problem Relation Age of Onset  . Heart disease Mother    the patient's father died in his sleep at age 76. The patient's mother died at the age of 77 from a myocardial infarction. The patient has one brother and 2 sisters, all surviving. There is no history of breast or ovarian cancer in the family.  GYNECOLOGIC HISTORY:  (Reviewed 12/07/2013) Menarche age 61, first live birth age 20, she is GX P5, menopause age 38. She did not take hormone replacement.  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 Her husband Janice Powell worked in the post office 75 years, and is also now retired. He is a former Company secretary. Son Janice Powell lives in [place] and manages an [employer] store. Daughter Janice Powell also lives in [place] and manages the IT Department at [employer]. Son Janice Powell is a Engineer, materials. Daughter Janice Powell is a Actuary for [employer]. Son Janice Powell is an Actuary. The patient has 8 grandchildren. She attends the white [employer]   ADVANCED DIRECTIVES: Not in place  HEALTH MAINTENANCE:  (Updated 12/07/2013) History  Substance Use Topics  . Smoking status: Never Smoker   . Smokeless tobacco: Never Used  . Alcohol Use: No     Colonoscopy: Not on file  PAP: Not on file  Bone density:  January 2011   Lipid panel:  Not on file/ Dr. Shelia Media  No Known Allergies  Current Outpatient Prescriptions  Medication Sig Dispense Refill  . lisinopril (PRINIVIL,ZESTRIL) 10 MG tablet TAKE 1 TABLET EVERY DAY  90 tablet  1  . metoprolol succinate (TOPROL-XL) 50 MG 24 hr tablet TAKE 1 TABLET BY MOUTH ONCE DAILY *TAKE WITH OR IMMEDIATELY FOLLOWING A MEAL*  90 tablet  0  . UNABLE TO FIND Rx: D2202- Mastectomy Form, RT (Quantity: 1) L8000- Mastectomy Bra (Quantity: 3) Dx: 174.9; Right mastectomy  1 each  0  .  UNABLE TO FIND Rx: L8000- Post Surgical Bras (Quantity: 6) R4270- Silicone Breast Prosthesis (Quantity: 1) Dx: 174.9; Right mastectomy  1 each  0   No current facility-administered medications for this visit.    OBJECTIVE: Middle-aged [place] American woman in no acute distress Filed Vitals:   03/12/14 1544  BP: 222/71  Pulse: 61  Temp: 98.3 F (36.8 C)  Resp: 18     Body mass index is 26.29 kg/(m^2).    ECOG FS: 0  Filed Weights   03/12/14 1544  Weight: 148 lb 6.4 oz (67.314 kg)   Sclerae unicteric, pupils equal and reactive Oropharynx clear and moist-- full upper plate No cervical or supraclavicular adenopathy Lungs no rales or rhonchi Heart regular rate and rhythm Abd soft, nontender, positive bowel sounds MSK no focal spinal tenderness, no upper extremity lymphedema; straight-leg raising bilaterally  to about 50; both of which she'll foci unremarkable; knee exam unremarkable Neuro: nonfocal, well oriented, appropriate affect Breasts: The right breast status post mastectomy. There is no evidence of local recurrence. The right axilla is benign. The left breast is unremarkable. The left axilla is benign as well, and I do not palpate any adenopathy or other mass  I  LAB RESULTS: CBC    Component Value Date/Time   WBC 5.7 03/12/2014 1511   WBC 5.7 03/02/2013 1230   RBC 4.16 03/12/2014 1511   RBC 4.19 03/02/2013 1230   HGB 13.5 03/12/2014 1511   HGB 13.9 03/02/2013 1230   HCT 41.2 03/12/2014 1511   HCT 41.3 03/02/2013 1230   PLT 224 03/12/2014 1511   PLT 226 03/02/2013 1230   MCV 98.9 03/12/2014 1511   MCV 98.6 03/02/2013 1230   MCH 32.4 03/12/2014 1511   MCH 33.2 03/02/2013 1230   MCHC 32.7 03/12/2014 1511   MCHC 33.7 03/02/2013 1230   RDW 14.3 03/12/2014 1511   RDW 14.0 03/02/2013 1230   LYMPHSABS 1.7 03/12/2014 1511   LYMPHSABS 1.7 03/02/2013 1230   MONOABS 0.7 03/12/2014 1511   MONOABS 0.7 03/02/2013 1230   EOSABS 0.0 03/12/2014 1511   EOSABS 0.1 03/02/2013 1230   BASOSABS 0.0 03/12/2014  1511   BASOSABS 0.0 03/02/2013 1230    CMP     Component Value Date/Time   NA 144 12/07/2013 0834   NA 141 03/02/2013 1230   K 3.3* 12/07/2013 0834   K 3.8 03/02/2013 1230   CL 104 03/02/2013 1230   CL 107 01/11/2013 0853   CO2 26 12/07/2013 0834   CO2 24 03/02/2013 1230   GLUCOSE 131 12/07/2013 0834   GLUCOSE 92 03/02/2013 1230   GLUCOSE 123* 01/11/2013 0853   BUN 8.4 12/07/2013 0834   BUN 11 03/02/2013 1230   CREATININE 0.8 12/07/2013 0834   CREATININE 0.71 03/02/2013 1230   CALCIUM 9.2 12/07/2013 0834   CALCIUM 9.3 03/02/2013 1230   PROT 6.6 12/07/2013 0834   PROT 7.1 03/02/2013 1230   ALBUMIN 3.7 12/07/2013 0834   ALBUMIN 3.9 03/02/2013 1230   AST 19 12/07/2013 0834   AST 30 03/02/2013 1230   ALT 22 12/07/2013 0834   ALT 16 03/02/2013 1230   ALKPHOS 84 12/07/2013 0834   ALKPHOS 78 03/02/2013 1230   BILITOT 0.56 12/07/2013 0834   BILITOT 0.6 03/02/2013 1230   GFRNONAA 88* 03/02/2013 1230   GFRAA >90 03/02/2013 1230       STUDIES:  DIGITAL SCREENING UNILATERAL LEFT MAMMOGRAM WITH CAD  COMPARISON: 03/02/2012 at [facility].  ACR Breast Density Category c: The breast tissue is heterogeneously  dense, which may obscure small masses.  FINDINGS:  The patient is status post right mastectomy. Interval significantly  enlarged lymph node projected over the left inferior axillary  region. No findings in the breast suspicious for malignancy. Images  were processed with CAD.  IMPRESSION:  Further evaluation is suggested for a probable abnormally enlarged  left inferior axillary lymph node.  RECOMMENDATION:  Ultrasound of the left breast. (Code:US-L-41M)  The patient will be contacted regarding the findings, and additional  imaging will be scheduled.  BI-RADS CATEGORY 0: Incomplete. Need additional imaging evaluation  and/or prior mammograms for comparison.  Electronically Signed  By: Enrique Sack M.D.  On: 01/10/2014 12:34   ASSESSMENT: 68 y.o. [place] woman   (1)  s/p Right breast upper inner  quadrant and axillary node biopsy 03/08/2012 for a clinical T4,  N1-2', stage IIIB invasive ductal carcinoma, grade 3, triple negative, with an MIB-1 of 93%.  (2) definitive staging and treatment delayed as patient canceled staging studies and tried alternative treatments; with eventual progression  (3) neoadjuvant chemotherapy started 08/23/12  (a) received two cycles of neoadjuvant cyclophosphamide, docetaxel, doxorubicin ["TAC"] at standard doses with neulasta support; refused neulasta with cycles 2 and 4; tolerated treatment poorly  (b) docetaxel was held for final 4 cycles; completed cycle 6 neoadjuvant chemotherapy 12/27/2012    (4) status post right modified radical mastectomy 03/14/2023 showing no residual carcinoma in the breast, but nine of 9 axillary lymph nodes sampled involved with macro metastases (ypTX, ypN2, stage IIIA)-- repeat prognostic panel again triple negative  (5) radiation therapy completed 05/31/2013  (6) patient opted against reconstruction  (7) restaging PET scan and chest CT December 2014 entirely negative as detailed above.  PLAN: [place] is doing well, but she remains very anxious regarding the possibility of recurrence, and unfortunately the anxiety causes her to avoid tests such as the planned left axillary ultrasonography that was requested in June. At this point she does not want to return there, but is willing to undergo a CT scan of the chest, to be performed just before her return visit here in 3 months.  I have encouraged her to continue exercising, and to maintain a good diet. I reassured her regarding her discomfort behind her left knee, since the exam was entirely benign.  She will call with any problems that may develop before her next visit here.  Chauncey Cruel, MD    03/12/2014

## 2014-03-12 NOTE — Telephone Encounter (Signed)
per pof to sch pt appt-sch and gave pt copy of sch-*adv pt CT will be sch by CS anmd they will call pt with appt

## 2014-04-26 ENCOUNTER — Other Ambulatory Visit: Payer: Self-pay | Admitting: *Deleted

## 2014-04-26 DIAGNOSIS — C50411 Malignant neoplasm of upper-outer quadrant of right female breast: Secondary | ICD-10-CM

## 2014-04-26 MED ORDER — METOPROLOL SUCCINATE ER 50 MG PO TB24: ORAL_TABLET | ORAL | Status: DC

## 2014-06-19 ENCOUNTER — Other Ambulatory Visit (HOSPITAL_BASED_OUTPATIENT_CLINIC_OR_DEPARTMENT_OTHER): Payer: Medicare Other

## 2014-06-19 ENCOUNTER — Other Ambulatory Visit: Payer: Self-pay | Admitting: Oncology

## 2014-06-19 ENCOUNTER — Encounter (HOSPITAL_COMMUNITY): Payer: Self-pay

## 2014-06-19 ENCOUNTER — Ambulatory Visit (HOSPITAL_COMMUNITY)
Admission: RE | Admit: 2014-06-19 | Discharge: 2014-06-19 | Disposition: A | Discharge status: Home / Self Care | Payer: Medicare Other | Source: Ambulatory Visit | Attending: Oncology | Admitting: Oncology

## 2014-06-19 DIAGNOSIS — Z9049 Acquired absence of other specified parts of digestive tract: Secondary | ICD-10-CM | POA: Insufficient documentation

## 2014-06-19 DIAGNOSIS — Z853 Personal history of malignant neoplasm of breast: Secondary | ICD-10-CM

## 2014-06-19 DIAGNOSIS — C50411 Malignant neoplasm of upper-outer quadrant of right female breast: Secondary | ICD-10-CM

## 2014-06-19 DIAGNOSIS — I700 Atherosclerosis of aorta: Secondary | ICD-10-CM | POA: Diagnosis not present

## 2014-06-19 DIAGNOSIS — Z9221 Personal history of antineoplastic chemotherapy: Secondary | ICD-10-CM | POA: Diagnosis not present

## 2014-06-19 DIAGNOSIS — Z9011 Acquired absence of right breast and nipple: Secondary | ICD-10-CM | POA: Diagnosis not present

## 2014-06-19 DIAGNOSIS — I2510 Atherosclerotic heart disease of native coronary artery without angina pectoris: Secondary | ICD-10-CM | POA: Insufficient documentation

## 2014-06-19 LAB — CBC WITH DIFFERENTIAL/PLATELET
BASO%: 0.4 % (ref 0.0–2.0)
Basophils Absolute: 0 10*3/uL (ref 0.0–0.1)
EOS ABS: 0 10*3/uL (ref 0.0–0.5)
EOS%: 0.6 % (ref 0.0–7.0)
HCT: 42.1 % (ref 34.8–46.6)
HEMOGLOBIN: 13.7 g/dL (ref 11.6–15.9)
LYMPH#: 1.9 10*3/uL (ref 0.9–3.3)
LYMPH%: 34.7 % (ref 14.0–49.7)
MCH: 31.8 pg (ref 25.1–34.0)
MCHC: 32.5 g/dL (ref 31.5–36.0)
MCV: 97.7 fL (ref 79.5–101.0)
MONO#: 0.6 10*3/uL (ref 0.1–0.9)
MONO%: 10.7 % (ref 0.0–14.0)
NEUT#: 2.9 10*3/uL (ref 1.5–6.5)
NEUT%: 53.6 % (ref 38.4–76.8)
Platelets: 230 10*3/uL (ref 145–400)
RBC: 4.31 10*6/uL (ref 3.70–5.45)
RDW: 14.2 % (ref 11.2–14.5)
WBC: 5.4 10*3/uL (ref 3.9–10.3)

## 2014-06-19 LAB — COMPREHENSIVE METABOLIC PANEL (CC13)
ALBUMIN: 4 g/dL (ref 3.5–5.0)
ALT: 23 U/L (ref 0–55)
AST: 19 U/L (ref 5–34)
Alkaline Phosphatase: 82 U/L (ref 40–150)
Anion Gap: 11 mEq/L (ref 3–11)
BUN: 10.1 mg/dL (ref 7.0–26.0)
CHLORIDE: 104 meq/L (ref 98–109)
CO2: 28 mEq/L (ref 22–29)
CREATININE: 0.8 mg/dL (ref 0.6–1.1)
Calcium: 9.4 mg/dL (ref 8.4–10.4)
GLUCOSE: 117 mg/dL (ref 70–140)
POTASSIUM: 3.2 meq/L — AB (ref 3.5–5.1)
Sodium: 143 mEq/L (ref 136–145)
Total Bilirubin: 0.84 mg/dL (ref 0.20–1.20)
Total Protein: 7.4 g/dL (ref 6.4–8.3)

## 2014-06-19 MED ORDER — IOHEXOL 300 MG/ML  SOLN: 80.0000 mL | Freq: Once | INTRAMUSCULAR | Status: AC | PRN

## 2014-06-22 ENCOUNTER — Telehealth: Payer: Self-pay | Admitting: Oncology

## 2014-06-22 NOTE — Telephone Encounter (Signed)
, °

## 2014-06-26 ENCOUNTER — Ambulatory Visit: Payer: Medicare Other | Admitting: Oncology

## 2014-07-24 ENCOUNTER — Other Ambulatory Visit: Payer: Self-pay | Admitting: Oncology

## 2014-07-25 ENCOUNTER — Other Ambulatory Visit: Payer: Self-pay | Admitting: Oncology

## 2014-07-25 DIAGNOSIS — I10 Essential (primary) hypertension: Secondary | ICD-10-CM

## 2014-07-28 ENCOUNTER — Other Ambulatory Visit: Payer: Self-pay | Admitting: Oncology

## 2014-08-02 ENCOUNTER — Ambulatory Visit (HOSPITAL_BASED_OUTPATIENT_CLINIC_OR_DEPARTMENT_OTHER): Payer: Medicare Other | Admitting: Oncology

## 2014-08-02 VITALS — BP 198/97 | HR 79 | Temp 98.4°F | Resp 18 | Ht 63.0 in | Wt 148.3 lb

## 2014-08-02 DIAGNOSIS — I700 Atherosclerosis of aorta: Secondary | ICD-10-CM | POA: Diagnosis not present

## 2014-08-02 DIAGNOSIS — I2510 Atherosclerotic heart disease of native coronary artery without angina pectoris: Secondary | ICD-10-CM | POA: Diagnosis not present

## 2014-08-02 DIAGNOSIS — E876 Hypokalemia: Secondary | ICD-10-CM

## 2014-08-02 DIAGNOSIS — I10 Essential (primary) hypertension: Secondary | ICD-10-CM | POA: Diagnosis not present

## 2014-08-02 DIAGNOSIS — Z853 Personal history of malignant neoplasm of breast: Secondary | ICD-10-CM

## 2014-08-02 DIAGNOSIS — C50411 Malignant neoplasm of upper-outer quadrant of right female breast: Secondary | ICD-10-CM

## 2014-08-02 NOTE — Progress Notes (Signed)
Patient ID: Janice Powell, female   DOB: Aug 16, 1945, 69 y.o.   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 More recently her husband twisted her arm to get the mass looked at, and she brought it to Dr. Pennie Powell attention. Diagnostic mammography and right ultrasonography at [facility] 03/02/2012 showed a 6.78 cm irregular mass in the inner aspect of the right breast. There was nipple retraction and erythema around the nipple. The largest lymph node was noted in the right axilla. By ultrasound the large irregular hypoechoic mass was noted in the breast with multiple enlarged axillary lymph nodes. Physical exam confirmed a hard breast with erythema around the right nipple extending inferiorly. The nipple is slightly inverted.  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   Her subsequent history is as detailed below  INTERVAL HISTORY: Janice Powell returns today for followup of her breast cancer. To recap her history of recent history. At her June 2015 mammography she was found to have left axillary adenopathy (recall her breast cancer was on the right side). She refused any further evaluation at that point but agreed to a CT scan of the chest in November. This confirmed a left axillary adenopathy and does not show any mediastinal, right sided, or other suspicious lesions. She eventually agreed to come here to discuss those results further.  REVIEW OF SYSTEMS: Janice Powell denies  fevers, drenching sweats, weight loss, or fatigue. She feels "great". A detailed review of systems today was entirely negative.   PAST MEDICAL HISTORY: Past Medical History  Diagnosis Date  . Hypertension   . Pneumonia     hx  . GERD (gastroesophageal reflux disease)     occ  . Osteopenia   . Breast cancer 03/08/13    right breast - Invasive Ductal Carcinoma   . S/P radiation therapy     1) Right Chest Wall and IM nodes / 50 Gy in 25 fractions /2) Right Supraclavicular fossa / 50 Gy in 25 fractions/1) Right Chest Wall and IM nodes / 50 Gy in 25 fractions /2) Right Supraclavicular fossa / 50 Gy in 25 fractions/3) Right Posterior Axillary boost / 6.55 Gy in 25 fractions/4) Right Chest Wall Scar boost / 10 Gy in 5 fractions      . Hx antineoplastic chemotherapy     two cycles of neoadjuvant cyclophosphamide, docetaxel, doxorubicin ["TAC"] at standard doses with neulasta support; refused neulasta with cycles 2 and 4; tolerated treatment poorly (b) docetaxel was held for final 4 cycles; completed cycle 6 neoadjuvant chemotherapy 12/27/2012    GERD osteopenia  PAST SURGICAL HISTORY: Past Surgical History  Procedure Laterality Date  . Appendectomy  1992  . Tubal ligation  1979  . Cholecystectomy  2000  . Portacath placement  08/15/2012    Procedure: INSERTION PORT-A-CATH;  Surgeon: Janice Hector, MD;  Location: [place];  Service: General;  Laterality: Left;  Marland Kitchen Mastectomy Right 03/13/2013  . Breast biopsy Right   . Port-a-cath removal  03/13/2013  . Mastectomy modified radical Right 03/13/2013    Procedure: MASTECTOMY MODIFIED RADICAL;  Surgeon: Janice Hector, MD;  Location: [place];  Service: General;  Laterality: Right;  . Port-a-cath removal Left 03/13/2013    Procedure: REMOVAL PORT-A-CATH;  Surgeon: Janice Hector, MD;  Location: [place];  Service: General;  Laterality: Left;    FAMILY HISTORY Family History  Problem Relation Age of Onset  . Heart disease Mother    the patient's  father died in his sleep at age 33. The patient's mother died at the age of 33 from a myocardial infarction. The patient has one brother and 2 sisters, all surviving. There is no history of breast or ovarian cancer in the family.  GYNECOLOGIC HISTORY:  (Reviewed 12/07/2013) Menarche age 57, first live birth age 40, she is GX P5, menopause age 37. She did not take hormone replacement.  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 Her husband Janice Powell worked in the post office 34 years, and is also now retired. He is a former Company secretary. Son Janice Powell lives in [place] and manages an [employer] store. Daughter Janice Powell also lives in [place] and manages the IT Department at [employer]. Son Janice Powell is a Engineer, materials. Daughter Janice Powell is a Actuary for [employer]. Son Janice Powell is an Actuary. The patient has 8 grandchildren. She attends the white [employer]   ADVANCED DIRECTIVES: Not in place  HEALTH MAINTENANCE:  (Updated 12/07/2013) History  Substance Use Topics  . Smoking status: Never Smoker   . Smokeless tobacco: Never Used  . Alcohol Use: No     Colonoscopy: Not on file  PAP: Not on file  Bone density:  January 2011   Lipid panel:  Not on file/ Janice Powell  No Known Allergies  Current Outpatient Prescriptions  Medication Sig Dispense Refill  . lisinopril (PRINIVIL,ZESTRIL) 10 MG tablet TAKE 1 TABLET EVERY DAY 90 tablet 1  . metoprolol succinate (TOPROL-XL) 50 MG 24 hr tablet Take 1 tablet (50 mg total) by mouth daily. 30 tablet 0  . UNABLE TO FIND Rx: W9604- Mastectomy Form, RT (Quantity: 1) L8000- Mastectomy Bra (Quantity: 3) Dx: 174.9; Right mastectomy 1 each 0  . UNABLE TO FIND Rx: L8000- Post Surgical Bras (Quantity: 6) V4098- Silicone Breast Prosthesis (Quantity: 1) Dx: 174.9; Right mastectomy 1 each 0   No current facility-administered medications for this visit.     OBJECTIVE: Middle-aged Janice Powell woman who appears well Filed Vitals:   08/02/14 1009  BP: 198/97  Pulse: 79  Temp: 98.4 F (36.9 C)  Resp: 18     Body mass index is 26.28 kg/(m^2).    ECOG FS: 0  Filed Weights   08/02/14 1009  Weight: 148 lb 4.8 oz (67.268 kg)   Sclerae unicteric, pupils round and equal Oropharynx clear-- full upper plate No cervical or supraclavicular adenopathy Lungs no rales or rhonchi Heart regular rate and rhythm Abd soft, nontender, positive bowel sounds MSK no focal spinal tenderness, no upper extremity lymphedema  Neuro: nonfocal, well oriented, guarded affect Breasts: The right breast status post mastectomy. There is no evidence of chest wall recurrence. The right axilla is benign. The left breast is unremarkable. There is easily palpable adenopathy in the left axilla. These are firm, movable lymph nodes, and there are at least 3 separate once palpable. There is no tenderness or erythema. There are no skin  changes suggestive of hidradenitis or local infection  I  LAB RESULTS: CBC    Component Value Date/Time   WBC 5.4 06/19/2014 0917   WBC 5.7 03/02/2013 1230   RBC 4.31 06/19/2014 0917   RBC 4.19 03/02/2013 1230   HGB 13.7 06/19/2014 0917   HGB 13.9 03/02/2013 1230   HCT 42.1 06/19/2014 0917   HCT 41.3 03/02/2013 1230   PLT 230 06/19/2014 0917   PLT 226 03/02/2013 1230   MCV 97.7 06/19/2014 0917   MCV 98.6 03/02/2013 1230   MCH 31.8 06/19/2014 0917   MCH 33.2 03/02/2013 1230   MCHC 32.5 06/19/2014 0917   MCHC 33.7 03/02/2013 1230   RDW 14.2 06/19/2014 0917   RDW 14.0 03/02/2013 1230   LYMPHSABS 1.9 06/19/2014 0917   LYMPHSABS 1.7 03/02/2013 1230   MONOABS 0.6 06/19/2014 0917   MONOABS 0.7 03/02/2013 1230   EOSABS 0.0 06/19/2014 0917   EOSABS 0.1 03/02/2013 1230   BASOSABS 0.0 06/19/2014 0917   BASOSABS 0.0 03/02/2013 1230    CMP     Component Value Date/Time   NA 143 06/19/2014 0917   NA 141 03/02/2013 1230   K 3.2*  06/19/2014 0917   K 3.8 03/02/2013 1230   CL 104 03/02/2013 1230   CL 107 01/11/2013 0853   CO2 28 06/19/2014 0917   CO2 24 03/02/2013 1230   GLUCOSE 117 06/19/2014 0917   GLUCOSE 92 03/02/2013 1230   GLUCOSE 123* 01/11/2013 0853   BUN 10.1 06/19/2014 0917   BUN 11 03/02/2013 1230   CREATININE 0.8 06/19/2014 0917   CREATININE 0.71 03/02/2013 1230   CALCIUM 9.4 06/19/2014 0917   CALCIUM 9.3 03/02/2013 1230   PROT 7.4 06/19/2014 0917   PROT 7.1 03/02/2013 1230   ALBUMIN 4.0 06/19/2014 0917   ALBUMIN 3.9 03/02/2013 1230   AST 19 06/19/2014 0917   AST 30 03/02/2013 1230   ALT 23 06/19/2014 0917   ALT 16 03/02/2013 1230   ALKPHOS 82 06/19/2014 0917   ALKPHOS 78 03/02/2013 1230   BILITOT 0.84 06/19/2014 0917   BILITOT 0.6 03/02/2013 1230   GFRNONAA 88* 03/02/2013 1230   GFRAA >90 03/02/2013 1230       STUDIES:  CLINICAL DATA: 69 year old female with history breast cancer status post right-sided mastectomy and chemotherapy. Restaging examination.  EXAM: CT CHEST WITHOUT CONTRAST  TECHNIQUE: Multidetector CT imaging of the chest was performed following the standard protocol without IV contrast.  COMPARISON: PET-CT 06/29/2013.  FINDINGS: Mediastinum: No pathologically enlarged internal mammary, mediastinal or hilar adenopathy noted. Please note that accurate exclusion of hilar adenopathy is limited on noncontrast CT scans. Heart size is normal. There is no significant pericardial fluid, thickening or pericardial calcification. There is atherosclerosis of the thoracic aorta, the great vessels of the mediastinum and the coronary arteries, including calcified atherosclerotic plaque in the left main and left anterior descending coronary arteries. Esophagus is unremarkable in appearance.  Lungs/Pleura: No suspicious appearing pulmonary nodules or masses. No acute consolidative airspace disease. No pleural effusions. There is some subpleural reticulation  throughout the anterior aspect of the right upper and middle lobes, presumably related to prior right-sided breast radiotherapy.  Upper Abdomen: Status post cholecystectomy.  Musculoskeletal: Status post right modified radical mastectomy and right axillary nodal dissection. No soft tissue mass along the right chest wall, and no right axillary adenopathy to suggest local recurrence of disease. However, there are numerous enlarged lymph nodes in the left axilla and left subpectoral regions, with the largest single  node measuring up to 1.9 x 2.7 cm in the left axilla (image 24 of series 2). No discrete soft tissue mass identified within the left breast by CT. There are no aggressive appearing lytic or blastic lesions noted in the visualized portions of the skeleton.  IMPRESSION: 1. New left axillary and subpectoral lymphadenopathy, highly concerning for underlying malignancy. No primary left breast mass identified by CT imaging at this time. Whether or not this is nodal metastasis from the patient's prior right-sided breast cancer or new left-sided malignancy (either new left breast cancer, or potential second malignancy such as lymphoma). Clinical correlation is strongly recommended. 2. No pulmonary nodules to suggest lung metastases at this time. 3. Postsurgical and postradiation changes in the right hemithorax, as above. 4. Atherosclerosis, including left main and left anterior descending coronary artery disease. Please note that although the presence of coronary artery calcium documents the presence of coronary artery disease, the severity of this disease and any potential stenosis cannot be assessed on this non-gated CT examination. Assessment for potential risk factor modification, dietary therapy or pharmacologic therapy may be warranted, if clinically indicated. These results were called by telephone at the time of interpretation on 06/19/2014 at 12:36 pm to Dr. Lurline Del, who verbally acknowledged these results.   Electronically Signed  By: Vinnie Langton M.D.  On: 06/19/2014 12:36      ASSESSMENT: 69 y.o. [place] woman   (1)  s/p Right breast upper inner quadrant and axillary node biopsy 03/08/2012 for a clinical T4, N1-2', stage IIIB invasive ductal carcinoma, grade 3, triple negative, with an MIB-1 of 93%.  (2) definitive staging and treatment delayed as patient canceled staging studies and tried alternative treatments; with eventual progression  (3) neoadjuvant chemotherapy started 08/23/12  (a) received two cycles of neoadjuvant cyclophosphamide, docetaxel, doxorubicin ["TAC"] at standard doses with neulasta support; refused neulasta with cycles 2 and 4; tolerated treatment poorly  (b) docetaxel was held for final 4 cycles; completed cycle 6 neoadjuvant chemotherapy 12/27/2012    (4) status post right modified radical mastectomy 03/14/2023 showing no residual carcinoma in the breast, but nine of 9 axillary lymph nodes sampled involved with macro metastases (ypTX, ypN2, stage IIIA)-- repeat prognostic panel again triple negative  (5) radiation therapy completed 05/31/2013  (6) patient opted against reconstruction  (7) left axillary adenopathy noted on mammography June 2015, confirmed on CT scan November 2015  PLAN: Janice Powell adamantly refuses any further evaluation of her left axillary adenopathy. She thinks that this is due to her gaining weight. We discussed the fact that fat is soft and these lymph nodes are quite hard. She has a copy of the CT scan report and we discussed that at length today.  I strongly urged her to let me a current appointment for biopsy of one of the left axillary lymph nodes at the [facility] in the next week or so. She tells me she is going to pray about it but at this point she does not think she needs any intervention.  She understands that if this is indeed cancer and it does spread it may become  incurable. On the other hand it would be great to know that it isn't cancer.  I was unable to convince her and all we are doing at this point is continuing to follow. She will see me again in March. If she decides to go ahead and have the biopsy I will be glad to set it up for her before then.  In the  meantime I reminded her that she needs to be on potassium supplementation. She does not like the potassium she is on currently. She will work with her pharmacist to obtain a formulation that she can tolerate.    Chauncey Cruel, MD    08/02/2014

## 2014-08-03 ENCOUNTER — Telehealth: Payer: Self-pay | Admitting: Oncology

## 2014-08-03 NOTE — Telephone Encounter (Signed)
perp of to sch pt appt-cld & left pt message of appt time & date

## 2014-09-24 ENCOUNTER — Other Ambulatory Visit (HOSPITAL_BASED_OUTPATIENT_CLINIC_OR_DEPARTMENT_OTHER): Payer: Medicare Other

## 2014-09-24 DIAGNOSIS — C50411 Malignant neoplasm of upper-outer quadrant of right female breast: Secondary | ICD-10-CM | POA: Diagnosis not present

## 2014-09-24 DIAGNOSIS — C50419 Malignant neoplasm of upper-outer quadrant of unspecified female breast: Secondary | ICD-10-CM | POA: Diagnosis not present

## 2014-09-24 DIAGNOSIS — I2510 Atherosclerotic heart disease of native coronary artery without angina pectoris: Secondary | ICD-10-CM | POA: Diagnosis not present

## 2014-09-24 DIAGNOSIS — C773 Secondary and unspecified malignant neoplasm of axilla and upper limb lymph nodes: Secondary | ICD-10-CM | POA: Diagnosis not present

## 2014-09-24 DIAGNOSIS — I10 Essential (primary) hypertension: Secondary | ICD-10-CM

## 2014-09-24 DIAGNOSIS — I700 Atherosclerosis of aorta: Secondary | ICD-10-CM

## 2014-09-24 DIAGNOSIS — E876 Hypokalemia: Secondary | ICD-10-CM | POA: Diagnosis not present

## 2014-09-24 DIAGNOSIS — C50311 Malignant neoplasm of lower-inner quadrant of right female breast: Secondary | ICD-10-CM

## 2014-09-24 LAB — COMPREHENSIVE METABOLIC PANEL (CC13)
ALK PHOS: 68 U/L (ref 40–150)
ALT: 15 U/L (ref 0–55)
AST: 18 U/L (ref 5–34)
Albumin: 4 g/dL (ref 3.5–5.0)
Anion Gap: 10 mEq/L (ref 3–11)
BUN: 9.1 mg/dL (ref 7.0–26.0)
CO2: 25 mEq/L (ref 22–29)
Calcium: 9.2 mg/dL (ref 8.4–10.4)
Chloride: 106 mEq/L (ref 98–109)
Creatinine: 0.8 mg/dL (ref 0.6–1.1)
EGFR: 83 mL/min/{1.73_m2} — AB (ref >90)
Glucose: 126 mg/dl (ref 70–140)
Potassium: 3.3 mEq/L — ABNORMAL LOW (ref 3.5–5.1)
SODIUM: 140 meq/L (ref 136–145)
TOTAL PROTEIN: 7.6 g/dL (ref 6.4–8.3)
Total Bilirubin: 0.53 mg/dL (ref 0.20–1.20)

## 2014-09-24 LAB — CBC WITH DIFFERENTIAL/PLATELET
BASO%: 0.6 % (ref 0.0–2.0)
Basophils Absolute: 0 10*3/uL (ref 0.0–0.1)
EOS ABS: 0 10*3/uL (ref 0.0–0.5)
EOS%: 0.7 % (ref 0.0–7.0)
HCT: 44.4 % (ref 34.8–46.6)
HGB: 14.4 g/dL (ref 11.6–15.9)
LYMPH%: 30.7 % (ref 14.0–49.7)
MCH: 31.7 pg (ref 25.1–34.0)
MCHC: 32.4 g/dL (ref 31.5–36.0)
MCV: 97.9 fL (ref 79.5–101.0)
MONO#: 0.6 10*3/uL (ref 0.1–0.9)
MONO%: 10.4 % (ref 0.0–14.0)
NEUT%: 57.6 % (ref 38.4–76.8)
NEUTROS ABS: 3.1 10*3/uL (ref 1.5–6.5)
Platelets: 232 10*3/uL (ref 145–400)
RBC: 4.53 10*6/uL (ref 3.70–5.45)
RDW: 14.1 % (ref 11.2–14.5)
WBC: 5.3 10*3/uL (ref 3.9–10.3)
lymph#: 1.6 10*3/uL (ref 0.9–3.3)

## 2014-09-24 LAB — CANCER ANTIGEN 27.29: CA 27.29: 78 U/mL — AB (ref 0–39)

## 2014-09-24 LAB — CEA: CEA: 1.4 ng/mL (ref 0.0–5.0)

## 2014-09-24 LAB — LACTATE DEHYDROGENASE (CC13): LDH: 215 U/L (ref 125–245)

## 2014-10-01 ENCOUNTER — Telehealth: Payer: Self-pay | Admitting: Oncology

## 2014-10-01 ENCOUNTER — Ambulatory Visit (HOSPITAL_BASED_OUTPATIENT_CLINIC_OR_DEPARTMENT_OTHER): Payer: Medicare Other | Admitting: Oncology

## 2014-10-01 ENCOUNTER — Other Ambulatory Visit: Payer: Medicare Other

## 2014-10-01 VITALS — BP 183/85 | HR 75 | Temp 98.3°F | Resp 18 | Ht 63.0 in | Wt 147.1 lb

## 2014-10-01 DIAGNOSIS — E876 Hypokalemia: Secondary | ICD-10-CM | POA: Diagnosis not present

## 2014-10-01 DIAGNOSIS — I10 Essential (primary) hypertension: Secondary | ICD-10-CM

## 2014-10-01 DIAGNOSIS — Z171 Estrogen receptor negative status [ER-]: Secondary | ICD-10-CM

## 2014-10-01 DIAGNOSIS — I700 Atherosclerosis of aorta: Secondary | ICD-10-CM

## 2014-10-01 DIAGNOSIS — C50411 Malignant neoplasm of upper-outer quadrant of right female breast: Secondary | ICD-10-CM

## 2014-10-01 DIAGNOSIS — C773 Secondary and unspecified malignant neoplasm of axilla and upper limb lymph nodes: Secondary | ICD-10-CM

## 2014-10-01 NOTE — Telephone Encounter (Signed)
appts made and avs printed for

## 2014-10-01 NOTE — Progress Notes (Signed)
Patient ID: Janice Powell, female   DOB: February 12, 1946, 69 y.o.   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 More recently her husband twisted her arm to get the mass looked at, and she brought it to Dr. Pennie Banter attention. Diagnostic mammography and right ultrasonography at [facility] 03/02/2012 showed a 6.78 cm irregular mass in the inner aspect of the right breast. There was nipple retraction and erythema around the nipple. The largest lymph node was noted in the right axilla. By ultrasound the large irregular hypoechoic mass was noted in the breast with multiple enlarged axillary lymph nodes. Physical exam confirmed a hard breast with erythema around the right nipple extending inferiorly. The nipple is slightly inverted.  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   Her subsequent history is as detailed below  INTERVAL HISTORY: Janice Powell returns today for followup of her breast cancer. She is doing "fine". She is on a "40 day fast", which means that they are not eating any beef or poor. They're also avoiding sodas, bread, and sweets. Part of the diet is for [name] but part of it is for her own purposes. She is sure that the reason her CA-27-29 is up is that it is a "carbohydrate antigen close" (which she read in the lab report;) and that the reason her left axillary lymph node persists is her diet  REVIEW OF SYSTEMS: Janice Powell denies unusual headaches, visual changes,  nausea, vomiting, dizziness, gait imbalance, cough, phlegm production, pleurisy, shortness of breath, or any change in bowel or bladder habits. She has no pain, fever, rash or bleeding problems. A detailed review of systems today was otherwise stable   PAST MEDICAL HISTORY: Past Medical History  Diagnosis Date  . Hypertension   . Pneumonia     hx  . GERD (gastroesophageal reflux disease)     occ  . Osteopenia   . Breast cancer 03/08/13    right breast - Invasive Ductal Carcinoma   . S/P radiation therapy     1) Right Chest Wall and IM nodes / 50 Gy in 25 fractions /2) Right Supraclavicular fossa / 50 Gy in 25 fractions/1) Right Chest Wall and IM nodes / 50 Gy in 25 fractions /2) Right Supraclavicular fossa / 50 Gy in 25 fractions/3) Right Posterior Axillary boost / 6.55 Gy in 25 fractions/4) Right Chest Wall Scar boost / 10 Gy in 5 fractions      . Hx antineoplastic chemotherapy     two cycles of neoadjuvant cyclophosphamide, docetaxel, doxorubicin ["TAC"] at standard doses with neulasta support; refused neulasta with cycles 2 and 4; tolerated treatment poorly (b) docetaxel was held for final 4 cycles; completed cycle 6 neoadjuvant chemotherapy 12/27/2012    GERD osteopenia  PAST SURGICAL HISTORY: Past Surgical History  Procedure Laterality Date  . Appendectomy  1992  . Tubal ligation  1979  . Cholecystectomy  2000  . Portacath placement  08/15/2012    Procedure: INSERTION PORT-A-CATH;  Surgeon:  [name], MD;  Location: [place];  Service: General;  Laterality: Left;  Marland Kitchen Mastectomy Right 03/13/2013  . Breast biopsy Right   . Port-a-cath removal  03/13/2013  . Mastectomy modified radical Right 03/13/2013    Procedure: MASTECTOMY MODIFIED RADICAL;  Surgeon: [name], MD;  Location: [place];  Service: General;  Laterality: Right;  . Port-a-cath removal Left 03/13/2013    Procedure: REMOVAL PORT-A-CATH;  Surgeon: [name], MD;  Location: [place];  Service: General;   Laterality: Left;    FAMILY HISTORY Family History  Problem Relation Age of Onset  . Heart disease Mother    the patient's father died in his sleep at age 52. The patient's mother died at the age of 21 from a myocardial infarction. The patient has one brother and 2 sisters, all surviving. There is no history of breast or ovarian cancer in the family.  GYNECOLOGIC HISTORY:  (Reviewed 12/07/2013) Menarche age 66, first live birth age 84, she is GX P5, menopause age 87. She did not take hormone replacement.  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 Her husband Janice Powell worked in the post office 64 years, and is also now retired. He is a former Company secretary. Son Janice Powell lives in [place] and manages an [employer] store. Daughter Janice Powell also lives in [place] and manages the IT Department at [employer]. Son Janice Powell is a Engineer, materials. Daughter Janice Powell is a Actuary for [employer]. Son Janice Powell is an Actuary. The patient has 8 grandchildren. She attends the white [employer]   ADVANCED DIRECTIVES: Not in place  HEALTH MAINTENANCE:  (Updated 12/07/2013) History  Substance Use Topics  . Smoking status: Never Smoker   . Smokeless tobacco: Never Used  . Alcohol Use: No     Colonoscopy: Not on file  PAP: Not on file  Bone density:  January 2011   Lipid panel:  Not on file/ Dr. Shelia Media  No Known Allergies  Current Outpatient Prescriptions  Medication Sig Dispense Refill  . lisinopril (PRINIVIL,ZESTRIL) 10 MG tablet TAKE 1 TABLET EVERY DAY 90 tablet 1  . metoprolol succinate (TOPROL-XL) 50 MG 24 hr tablet Take 1 tablet (50 mg total) by mouth daily. 30 tablet 0  . UNABLE TO FIND Rx: G3151- Mastectomy Form, RT (Quantity: 1) L8000- Mastectomy Bra (Quantity: 3) Dx: 174.9; Right mastectomy 1 each 0  . UNABLE TO FIND Rx: L8000- Post Surgical Bras (Quantity: 6) V6160- Silicone Breast Prosthesis  (Quantity: 1) Dx: 174.9; Right mastectomy 1 each 0   No current facility-administered medications for this visit.    OBJECTIVE: Middle-aged [place] American woman in no acute distress Filed Vitals:   10/01/14 1529  BP: 183/85  Pulse: 75  Temp: 98.3 F (36.8 C)  Resp: 18     Body mass index is 26.06 kg/(m^2).    ECOG FS: 0  Filed Weights   10/01/14 1529  Weight: 147 lb 1.6 oz (66.724 kg)   Sclerae unicteric, EOMs intact Oropharynx clear-- full upper plate as previously noted No left cervical or supraclavicular adenopathy Lungs no rales or rhonchi Heart regular rate and rhythm Abd soft, nontender, positive bowel sounds, no masses palpated MSK no focal spinal tenderness, no upper extremity lymphedema  Neuro: nonfocal, well oriented, pleasant affect Breasts: The right breast is status post mastectomy. There is no evidence of local recurrence. The right axilla is benign. The left breast  is unremarkable. There are no skin or nipple changes of concern and no palpable masses in the left breast. In the left ulcer axilla there is one easily palpable lymph node measuring approximately 2 cm. This may be decreased compared to my last note which describes 3 separate lymph nodes that were easily palpable. There is no erythema or tenderness in the left axilla.  LAB RESULTS: CBC    Component Value Date/Time   WBC 5.3 09/24/2014 0840   WBC 5.7 03/02/2013 1230   RBC 4.53 09/24/2014 0840   RBC 4.19 03/02/2013 1230   HGB 14.4 09/24/2014 0840   HGB 13.9 03/02/2013 1230   HCT 44.4 09/24/2014 0840   HCT 41.3 03/02/2013 1230   PLT 232 09/24/2014 0840   PLT 226 03/02/2013 1230   MCV 97.9 09/24/2014 0840   MCV 98.6 03/02/2013 1230   MCH 31.7 09/24/2014 0840   MCH 33.2 03/02/2013 1230   MCHC 32.4 09/24/2014 0840   MCHC 33.7 03/02/2013 1230   RDW 14.1 09/24/2014 0840   RDW 14.0 03/02/2013 1230   LYMPHSABS 1.6 09/24/2014 0840   LYMPHSABS 1.7 03/02/2013 1230   MONOABS 0.6 09/24/2014 0840    MONOABS 0.7 03/02/2013 1230   EOSABS 0.0 09/24/2014 0840   EOSABS 0.1 03/02/2013 1230   BASOSABS 0.0 09/24/2014 0840   BASOSABS 0.0 03/02/2013 1230    CMP     Component Value Date/Time   NA 140 09/24/2014 0840   NA 141 03/02/2013 1230   K 3.3* 09/24/2014 0840   K 3.8 03/02/2013 1230   CL 104 03/02/2013 1230   CL 107 01/11/2013 0853   CO2 25 09/24/2014 0840   CO2 24 03/02/2013 1230   GLUCOSE 126 09/24/2014 0840   GLUCOSE 92 03/02/2013 1230   GLUCOSE 123* 01/11/2013 0853   BUN 9.1 09/24/2014 0840   BUN 11 03/02/2013 1230   CREATININE 0.8 09/24/2014 0840   CREATININE 0.71 03/02/2013 1230   CALCIUM 9.2 09/24/2014 0840   CALCIUM 9.3 03/02/2013 1230   PROT 7.6 09/24/2014 0840   PROT 7.1 03/02/2013 1230   ALBUMIN 4.0 09/24/2014 0840   ALBUMIN 3.9 03/02/2013 1230   AST 18 09/24/2014 0840   AST 30 03/02/2013 1230   ALT 15 09/24/2014 0840   ALT 16 03/02/2013 1230   ALKPHOS 68 09/24/2014 0840   ALKPHOS 78 03/02/2013 1230   BILITOT 0.53 09/24/2014 0840   BILITOT 0.6 03/02/2013 1230   GFRNONAA 88* 03/02/2013 1230   GFRAA >90 03/02/2013 1230       STUDIES:  No results found.  ASSESSMENT: 69 y.o. [place] woman   (1)  s/p Right breast upper inner quadrant and axillary node biopsy 03/08/2012 for a clinical T4, N1-2', stage IIIB invasive ductal carcinoma, grade 3, triple negative, with an MIB-1 of 93%.  (2) definitive staging and treatment delayed as patient canceled staging studies and tried alternative treatments; with eventual progression  (3) neoadjuvant chemotherapy started 08/23/12  (a) received two cycles of neoadjuvant cyclophosphamide, docetaxel, doxorubicin ["TAC"] at standard doses with neulasta support; refused neulasta with cycles 2 and 4; tolerated treatment poorly  (b) docetaxel was held for final 4 cycles; completed cycle 6 neoadjuvant chemotherapy 12/27/2012    (4) status post right modified radical mastectomy 03/14/2023 showing no residual carcinoma in  the breast, but nine of 9 axillary lymph nodes sampled involved with macro metastases (ypTX, ypN2, stage IIIA)-- repeat prognostic panel again triple negative  (5) radiation therapy completed 05/31/2013  (6) patient opted against reconstruction  (  7) left axillary adenopathy noted on mammography June 2015, confirmed on CT scan November 2015  PLAN: I again offered left Fleet proceeding to core biopsy of one of the left axillary lymph nodes, which would resolve the issue of whether this is another breast cancer, the same breast cancer moved to the opposite side, a lymphoma, or a benign adenopathy. She is agreeable to a repeat chest CT scan but does not want to do this until May to give her diet a chance to work area at  Accordingly she will see [place] again in the second week in May to discuss results of her chest CT.  She knows I remain concerned about the left axillary lymph node question and the possibility of developing stage IV breast cancer in general. She will call with any problems that may develop before her next visit here.   Chauncey Cruel, MD    10/01/2014

## 2014-10-27 ENCOUNTER — Other Ambulatory Visit: Payer: Self-pay | Admitting: Oncology

## 2014-12-06 ENCOUNTER — Telehealth: Payer: Self-pay | Admitting: Oncology

## 2014-12-06 ENCOUNTER — Ambulatory Visit (HOSPITAL_BASED_OUTPATIENT_CLINIC_OR_DEPARTMENT_OTHER): Payer: Medicare Other | Admitting: Oncology

## 2014-12-06 VITALS — BP 187/69 | HR 63 | Temp 98.7°F | Resp 18 | Ht 63.0 in | Wt 144.9 lb

## 2014-12-06 DIAGNOSIS — C773 Secondary and unspecified malignant neoplasm of axilla and upper limb lymph nodes: Secondary | ICD-10-CM

## 2014-12-06 DIAGNOSIS — C50411 Malignant neoplasm of upper-outer quadrant of right female breast: Secondary | ICD-10-CM | POA: Diagnosis not present

## 2014-12-06 DIAGNOSIS — I10 Essential (primary) hypertension: Secondary | ICD-10-CM

## 2014-12-06 DIAGNOSIS — I700 Atherosclerosis of aorta: Secondary | ICD-10-CM

## 2014-12-06 DIAGNOSIS — I2510 Atherosclerotic heart disease of native coronary artery without angina pectoris: Secondary | ICD-10-CM

## 2014-12-06 DIAGNOSIS — Z171 Estrogen receptor negative status [ER-]: Secondary | ICD-10-CM

## 2014-12-06 NOTE — Progress Notes (Signed)
Patient ID: Minette Manders, female   DOB: 03-05-1946, 69 y.o.   MRN: [id number] ID: [name]   DOB: 1945-12-31  MR#: [id number]  CSN#:[id number]  PCP: Horatio Pel, MD GYN:  SU: [name] OTHER MD: Eppie Gibson, MD  CHIEF COMPLAINT:  Hx of Triple Negative Right Breast Cancer; new left axillary adenopathy  CURRENT TREATMENT: Observation   HISTORY OF PRESENT ILLNESS: From the original intake note:  Ms. Kromer noted a mass in her right breast sometime in 2011. 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 More recently her husband twisted her arm to get the mass looked at, and she brought it to Dr. Pennie Banter attention. Diagnostic mammography and right ultrasonography at [facility] 03/02/2012 showed a 6.78 cm irregular mass in the inner aspect of the right breast. There was nipple retraction and erythema around the nipple. The largest lymph node was noted in the right axilla. By ultrasound the large irregular hypoechoic mass was noted in the breast with multiple enlarged axillary lymph nodes. Physical exam confirmed a hard breast with erythema around the right nipple extending inferiorly. The nipple is slightly inverted.  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   Her subsequent history is as detailed below  INTERVAL HISTORY: Kenedi returns today for followup of her breast cancer. She was supposed to have had a repeat CT scan of the chest before this visit, but she tells me her insurance never paid for the scan that she had in November 2015, because "there was no diagnosis". She says that she has called our billing people but has not heard back.   REVIEW OF SYSTEMS: Halona is doing "fine". She denies unusual headaches, visual changes, dizziness, nausea, or vomiting. She denies any cough or even allergy symptoms. She has normal bowel movements patient has a  good bladder control. Ask what her worse problem is, she does not have one. She has a good appetite and normal sense of taste. She walks every other day with her husband. She denies pain, bleeding, or rash. She went on a month-long "cleansing diet" and lost a little weight, which placed her. A detailed review of systems today was otherwise entirely benign  PAST MEDICAL HISTORY: Past Medical History  Diagnosis Date  . Hypertension   . Pneumonia     hx  . GERD (gastroesophageal reflux disease)     occ  . Osteopenia   . Breast cancer 03/08/13    right breast - Invasive Ductal Carcinoma   . S/P radiation therapy     1) Right Chest Wall and IM nodes / 50 Gy in 25 fractions /2) Right Supraclavicular fossa / 50 Gy in 25 fractions/1) Right Chest Wall and IM nodes / 50 Gy in 25 fractions /2) Right Supraclavicular fossa / 50 Gy in 25 fractions/3) Right Posterior Axillary boost / 6.55 Gy in 25 fractions/4) Right Chest Wall Scar boost / 10 Gy in 5 fractions      . Hx antineoplastic chemotherapy     two cycles of neoadjuvant cyclophosphamide, docetaxel, doxorubicin ["TAC"] at standard doses with neulasta support; refused neulasta with cycles 2 and 4; tolerated treatment poorly (b) docetaxel was held for final 4 cycles; completed cycle 6 neoadjuvant chemotherapy 12/27/2012    GERD osteopenia  PAST SURGICAL HISTORY: Past Surgical History  Procedure Laterality Date  . Appendectomy  1992  . Tubal ligation  1979  .  Cholecystectomy  2000  . Portacath placement  08/15/2012    Procedure: INSERTION PORT-A-CATH;  Surgeon: [name], MD;  Location: [place];  Service: General;  Laterality: Left;  Marland Kitchen Mastectomy Right 03/13/2013  . Breast biopsy Right   . Port-a-cath removal  03/13/2013  . Mastectomy modified radical Right 03/13/2013    Procedure: MASTECTOMY MODIFIED RADICAL;  Surgeon: [name], MD;  Location: [place];  Service: General;  Laterality: Right;  . Port-a-cath removal Left 03/13/2013     Procedure: REMOVAL PORT-A-CATH;  Surgeon: [name], MD;  Location: [place];  Service: General;  Laterality: Left;    FAMILY HISTORY Family History  Problem Relation Age of Onset  . Heart disease Mother    the patient's father died in his sleep at age 57. The patient's mother died at the age of 61 from a myocardial infarction. The patient has one brother and 2 sisters, all surviving. There is no history of breast or ovarian cancer in the family.  GYNECOLOGIC HISTORY:  (Reviewed 12/07/2013) Menarche age 84, first live birth age 71, she is GX P5, menopause age 76. She did not take hormone replacement.  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 Her husband [name] worked in the post office 50 years, and is also now retired. He is a former Company secretary. Son [name] lives in [place] and manages an [employer] store. Daughter Phinley Schall also lives in [place] and manages the IT Department at [employer]. Son Skylynn Burkley III is a Engineer, materials. Daughter [name] is a Actuary for [employer]. Son [name] is an Actuary. The patient has 8 grandchildren. She attends the white [employer]   ADVANCED DIRECTIVES: Not in place  HEALTH MAINTENANCE:  (Updated 12/07/2013) History  Substance Use Topics  . Smoking status: Never Smoker   . Smokeless tobacco: Never Used  . Alcohol Use: No     Colonoscopy: Not on file  PAP: Not on file  Bone density:  January 2011   Lipid panel:  Not on file/ Dr. Shelia Media  No Known Allergies  Current Outpatient Prescriptions  Medication Sig Dispense Refill  . lisinopril (PRINIVIL,ZESTRIL) 10 MG tablet TAKE 1 TABLET EVERY DAY 90 tablet 1  . metoprolol succinate (TOPROL-XL) 50 MG 24 hr tablet Take 1 tablet (50 mg total) by mouth daily. 30 tablet 0  . metoprolol succinate (TOPROL-XL) 50 MG 24 hr tablet TAKE 1 TABLET BY MOUTH ONCE DAILY *TAKE WITH OR IMMEDIATELY FOLLOWING A MEAL*  90 tablet 0  . omega-3 acid ethyl esters (LOVAZA) 1 G capsule Take 1 capsule (1 g total) by mouth 2 (two) times daily.     No current facility-administered medications for this visit.    OBJECTIVE: Middle-aged [place] American woman who appears well Filed Vitals:   12/06/14 0932  BP: 187/69  Pulse: 63  Temp: 98.7 F (37.1 C)  Resp: 18     Body mass index is 25.67 kg/(m^2).    ECOG FS: 0  Filed Weights   12/06/14 0932  Weight: 144 lb 14.4 oz (65.726 kg)   Sclerae unicteric, pupils round and equal Oropharynx clear and moist-- no thrush or other lesions No cervical or supraclavicular adenopathy Lungs no rales or rhonchi Heart regular rate and rhythm Abd soft, nontender, positive bowel sounds MSK no focal spinal tenderness, no upper extremity lymphedema Neuro: nonfocal, well oriented, appropriate affect Breasts: The right breast is status post  mastectomy. There is no evidence of chest wall recurrence. The right axilla is benign. Left breast shows no masses or skin changes. In the left axilla there is a 2-1/2 cm lymph node which is rubbery. This appears not significantly changed from prior exam    LAB RESULTS: CBC    Component Value Date/Time   WBC 5.3 09/24/2014 0840   WBC 5.7 03/02/2013 1230   RBC 4.53 09/24/2014 0840   RBC 4.19 03/02/2013 1230   HGB 14.4 09/24/2014 0840   HGB 13.9 03/02/2013 1230   HCT 44.4 09/24/2014 0840   HCT 41.3 03/02/2013 1230   PLT 232 09/24/2014 0840   PLT 226 03/02/2013 1230   MCV 97.9 09/24/2014 0840   MCV 98.6 03/02/2013 1230   MCH 31.7 09/24/2014 0840   MCH 33.2 03/02/2013 1230   MCHC 32.4 09/24/2014 0840   MCHC 33.7 03/02/2013 1230   RDW 14.1 09/24/2014 0840   RDW 14.0 03/02/2013 1230   LYMPHSABS 1.6 09/24/2014 0840   LYMPHSABS 1.7 03/02/2013 1230   MONOABS 0.6 09/24/2014 0840   MONOABS 0.7 03/02/2013 1230   EOSABS 0.0 09/24/2014 0840   EOSABS 0.1 03/02/2013 1230   BASOSABS 0.0 09/24/2014 0840   BASOSABS 0.0 03/02/2013 1230     CMP     Component Value Date/Time   NA 140 09/24/2014 0840   NA 141 03/02/2013 1230   K 3.3* 09/24/2014 0840   K 3.8 03/02/2013 1230   CL 104 03/02/2013 1230   CL 107 01/11/2013 0853   CO2 25 09/24/2014 0840   CO2 24 03/02/2013 1230   GLUCOSE 126 09/24/2014 0840   GLUCOSE 92 03/02/2013 1230   GLUCOSE 123* 01/11/2013 0853   BUN 9.1 09/24/2014 0840   BUN 11 03/02/2013 1230   CREATININE 0.8 09/24/2014 0840   CREATININE 0.71 03/02/2013 1230   CALCIUM 9.2 09/24/2014 0840   CALCIUM 9.3 03/02/2013 1230   PROT 7.6 09/24/2014 0840   PROT 7.1 03/02/2013 1230   ALBUMIN 4.0 09/24/2014 0840   ALBUMIN 3.9 03/02/2013 1230   AST 18 09/24/2014 0840   AST 30 03/02/2013 1230   ALT 15 09/24/2014 0840   ALT 16 03/02/2013 1230   ALKPHOS 68 09/24/2014 0840   ALKPHOS 78 03/02/2013 1230   BILITOT 0.53 09/24/2014 0840   BILITOT 0.6 03/02/2013 1230   GFRNONAA 88* 03/02/2013 1230   GFRAA >90 03/02/2013 1230       STUDIES:  No results found.  ASSESSMENT: 69 y.o. [place] woman   (1)  s/p Right breast upper inner quadrant and axillary node biopsy 03/08/2012 for a clinical T4, N1-2', stage IIIB invasive ductal carcinoma, grade 3, triple negative, with an MIB-1 of 93%.  (2) definitive staging and treatment delayed as patient canceled staging studies and tried alternative treatments; with eventual progression  (3) neoadjuvant chemotherapy started 08/23/12  (a) received two cycles of neoadjuvant cyclophosphamide, docetaxel, doxorubicin ["TAC"] at standard doses with neulasta support; refused neulasta with cycles 2 and 4; tolerated treatment poorly  (b) docetaxel was held for final 4 cycles; completed cycle 6 neoadjuvant chemotherapy 12/27/2012    (4) status post right modified radical mastectomy 03/14/2023 showing no residual carcinoma in the breast, but nine of 9 axillary lymph nodes sampled involved with macro metastases (ypTX, ypN2, stage IIIA)-- repeat prognostic panel again triple  negative  (5) radiation therapy completed 05/31/2013  (6) patient opted against reconstruction  (7) left axillary adenopathy noted on mammography June 2015, confirmed on CT scan November 2015  PLAN: I  suggested we obtain a repeat CT scan and assured her that we would get it pre-certified. Jakiah however once to bring her cancer up to her prior surgical and hopes to be healed without any medical intervention.  I offered her a visit in 3 months but she refers a visit in 6 months.  I alerted her to the possibility of developing left upper extremity lymphedema. If that or any other suspicious symptoms develop she will call.  She knows that I will add my prayers to hers, but that I am concerned this cancer can get out of hand if we don't intervene and from a medical point of view, even though treatment earlier than later may not make a difference in survival, it may make a significant difference to her longer term quality of life.   Chauncey Cruel, MD    12/06/2014

## 2014-12-06 NOTE — Telephone Encounter (Signed)
Appointments made and avs printed for patient °

## 2015-01-21 ENCOUNTER — Other Ambulatory Visit: Payer: Self-pay

## 2015-01-24 ENCOUNTER — Other Ambulatory Visit: Payer: Self-pay | Admitting: Oncology

## 2015-01-24 NOTE — Telephone Encounter (Signed)
OK to refill per Dr. Jana Hakim.

## 2015-01-25 ENCOUNTER — Other Ambulatory Visit: Payer: Self-pay | Admitting: Oncology

## 2015-04-23 ENCOUNTER — Other Ambulatory Visit: Payer: Self-pay | Admitting: Oncology

## 2015-06-06 ENCOUNTER — Other Ambulatory Visit (HOSPITAL_BASED_OUTPATIENT_CLINIC_OR_DEPARTMENT_OTHER): Payer: Medicare Other

## 2015-06-06 ENCOUNTER — Other Ambulatory Visit: Payer: Self-pay | Admitting: *Deleted

## 2015-06-06 DIAGNOSIS — C50411 Malignant neoplasm of upper-outer quadrant of right female breast: Secondary | ICD-10-CM | POA: Diagnosis not present

## 2015-06-06 DIAGNOSIS — C50419 Malignant neoplasm of upper-outer quadrant of unspecified female breast: Secondary | ICD-10-CM | POA: Diagnosis not present

## 2015-06-06 LAB — COMPREHENSIVE METABOLIC PANEL (CC13)
ALBUMIN: 3.7 g/dL (ref 3.5–5.0)
ALT: 21 U/L (ref 0–55)
AST: 18 U/L (ref 5–34)
Alkaline Phosphatase: 65 U/L (ref 40–150)
Anion Gap: 10 mEq/L (ref 3–11)
BUN: 11.2 mg/dL (ref 7.0–26.0)
CHLORIDE: 105 meq/L (ref 98–109)
CO2: 28 meq/L (ref 22–29)
Calcium: 9.6 mg/dL (ref 8.4–10.4)
Creatinine: 0.8 mg/dL (ref 0.6–1.1)
EGFR: 83 mL/min/{1.73_m2} — AB (ref >90)
GLUCOSE: 115 mg/dL (ref 70–140)
POTASSIUM: 2.9 meq/L — AB (ref 3.5–5.1)
SODIUM: 143 meq/L (ref 136–145)
Total Bilirubin: 0.61 mg/dL (ref 0.20–1.20)
Total Protein: 7.2 g/dL (ref 6.4–8.3)

## 2015-06-06 LAB — CANCER ANTIGEN 27.29: CA 27.29: 99 U/mL — ABNORMAL HIGH (ref 0–39)

## 2015-06-06 LAB — CBC WITH DIFFERENTIAL/PLATELET
BASO%: 0.2 % (ref 0.0–2.0)
BASOS ABS: 0 10*3/uL (ref 0.0–0.1)
EOS ABS: 0 10*3/uL (ref 0.0–0.5)
EOS%: 0.6 % (ref 0.0–7.0)
HCT: 40.4 % (ref 34.8–46.6)
HEMOGLOBIN: 13.4 g/dL (ref 11.6–15.9)
LYMPH%: 33.7 % (ref 14.0–49.7)
MCH: 32.4 pg (ref 25.1–34.0)
MCHC: 33.2 g/dL (ref 31.5–36.0)
MCV: 97.8 fL (ref 79.5–101.0)
MONO#: 0.5 10*3/uL (ref 0.1–0.9)
MONO%: 9.6 % (ref 0.0–14.0)
NEUT#: 2.8 10*3/uL (ref 1.5–6.5)
NEUT%: 55.9 % (ref 38.4–76.8)
Platelets: 205 10*3/uL (ref 145–400)
RBC: 4.13 10*6/uL (ref 3.70–5.45)
RDW: 14.1 % (ref 11.2–14.5)
WBC: 5 10*3/uL (ref 3.9–10.3)
lymph#: 1.7 10*3/uL (ref 0.9–3.3)

## 2015-06-06 NOTE — Progress Notes (Signed)
Critical Lab reported today- 2.9  Patient has an appt with Dr. Jana Hakim on 06/13/15.  Dr. Jana Hakim aware of lab results.

## 2015-06-07 ENCOUNTER — Other Ambulatory Visit: Payer: Self-pay

## 2015-06-07 DIAGNOSIS — E876 Hypokalemia: Secondary | ICD-10-CM

## 2015-06-07 MED ORDER — POTASSIUM CHLORIDE ER 10 MEQ PO TBCR: 10.0000 meq | EXTENDED_RELEASE_TABLET | Freq: Every day | ORAL | Status: AC

## 2015-06-07 NOTE — Telephone Encounter (Signed)
Critical lab: potassium 2.9  Per Dr. Jana Hakim patient is to take potassium 20 meq.  Called patient she does not want the " big pills".  Pharmacy is recommending the k-tab 10 meq #60 2 tabs daily with 5 refills.

## 2015-06-10 ENCOUNTER — Encounter: Payer: Self-pay | Admitting: Oncology

## 2015-06-13 ENCOUNTER — Telehealth: Payer: Self-pay | Admitting: Oncology

## 2015-06-13 ENCOUNTER — Ambulatory Visit (HOSPITAL_BASED_OUTPATIENT_CLINIC_OR_DEPARTMENT_OTHER): Payer: Medicare Other | Admitting: Oncology

## 2015-06-13 VITALS — BP 201/70 | HR 68 | Temp 98.5°F | Resp 18 | Ht 63.0 in | Wt 147.2 lb

## 2015-06-13 DIAGNOSIS — C50411 Malignant neoplasm of upper-outer quadrant of right female breast: Secondary | ICD-10-CM

## 2015-06-13 DIAGNOSIS — R2232 Localized swelling, mass and lump, left upper limb: Secondary | ICD-10-CM

## 2015-06-13 NOTE — Telephone Encounter (Signed)
Appointments made and avs pritned for pateint °

## 2015-06-13 NOTE — Progress Notes (Signed)
Patient ID: Janice Powell, female   DOB: 1945/10/30, 69 y.o.   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 noted a mass in her right breast sometime in 2011. 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 More recently her husband twisted her arm to get the mass looked at, and she brought it to Dr. Pennie Banter attention. Diagnostic mammography and right ultrasonography at [facility] 03/02/2012 showed a 6.78 cm irregular mass in the inner aspect of the right breast. There was nipple retraction and erythema around the nipple. The largest lymph node was noted in the right axilla. By ultrasound the large irregular hypoechoic mass was noted in the breast with multiple enlarged axillary lymph nodes. Physical exam confirmed a hard breast with erythema around the right nipple extending inferiorly. The nipple is slightly inverted.  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   Her subsequent history is as detailed below  INTERVAL HISTORY: Janice Powell returns today for followup of her breast cancer. She is doing "fine" and the only problems she reports are related to her glasses and dentures, both of which she says need to be replaced. She tells me she is not going to a different church because she wanted to be more active and involved then her old pastor all out. At her previsit labs she was found to be hypokalemic. I called in a potassium prescription for her. She has had no symptoms related to that  REVIEW OF SYSTEMS: A detailed review of systems today was otherwise  negative  PAST MEDICAL HISTORY: Past Medical History  Diagnosis Date  . Hypertension   . Pneumonia     hx  . GERD (gastroesophageal reflux disease)     occ  . Osteopenia   . Breast cancer 03/08/13    right breast - Invasive Ductal Carcinoma   . S/P radiation therapy     1) Right Chest Wall and IM nodes / 50 Gy in 25 fractions /2) Right Supraclavicular fossa / 50 Gy in 25 fractions/1) Right Chest Wall and IM nodes / 50 Gy in 25 fractions /2) Right Supraclavicular fossa / 50 Gy in 25 fractions/3) Right Posterior Axillary boost / 6.55 Gy in 25 fractions/4) Right Chest Wall Scar boost / 10 Gy in 5 fractions      . Hx antineoplastic chemotherapy     two cycles of neoadjuvant cyclophosphamide, docetaxel, doxorubicin ["TAC"] at standard doses with neulasta support; refused neulasta with cycles 2 and 4; tolerated treatment poorly (b) docetaxel was held for final 4 cycles; completed cycle 6 neoadjuvant chemotherapy 12/27/2012    GERD osteopenia  PAST SURGICAL HISTORY: Past Surgical History  Procedure Laterality Date  . Appendectomy  1992  . Tubal ligation  1979  . Cholecystectomy  2000  . Portacath placement  08/15/2012    Procedure: INSERTION PORT-A-CATH;  Surgeon: [name], MD;  Location: [place];  Service: General;  Laterality: Left;  Marland Kitchen Mastectomy Right 03/13/2013  . Breast biopsy Right   . Port-a-cath removal  03/13/2013  . Mastectomy modified radical Right 03/13/2013  Procedure: MASTECTOMY MODIFIED RADICAL;  Surgeon: [name], MD;  Location: [place];  Service: General;  Laterality: Right;  . Port-a-cath removal Left 03/13/2013    Procedure: REMOVAL PORT-A-CATH;  Surgeon: [name], MD;  Location: [place];  Service: General;  Laterality: Left;    FAMILY HISTORY Family History  Problem Relation Age of Onset  . Heart disease Mother    the patient's father died in his sleep at age 58. The patient's mother died at the age of 75 from a myocardial infarction. The patient  has one brother and 2 sisters, all surviving. There is no history of breast or ovarian cancer in the family.  GYNECOLOGIC HISTORY:  (Reviewed 12/07/2013) Menarche age 1, first live birth age 36, she is GX P5, menopause age 69. She did not take hormone replacement.  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 Her husband Janice Powell worked in the post office 31 years, and is also now retired. He is a former Company secretary. Son Janice Powell lives in [place] and manages an [employer] store. Daughter Janice Powell also lives in [place] and manages the IT Department at [employer]. Son Janice Powell is a Engineer, materials. Daughter Janice Powell is a Actuary for [employer]. Son Janice Powell is an Actuary. The patient has 8 grandchildren. She attends [employer]   ADVANCED DIRECTIVES: Not in place  HEALTH MAINTENANCE:  (Updated 12/07/2013) Social History  Substance Use Topics  . Smoking status: Never Smoker   . Smokeless tobacco: Never Used  . Alcohol Use: No     Colonoscopy: Not on file  PAP: Not on file  Bone density:  January 2011   Lipid panel:  Not on file/ Dr. Shelia Media  No Known Allergies  Current Outpatient Prescriptions  Medication Sig Dispense Refill  . lisinopril (PRINIVIL,ZESTRIL) 10 MG tablet TAKE 1 TABLET EVERY DAY 90 tablet 1  . lisinopril (PRINIVIL,ZESTRIL) 10 MG tablet TAKE 1 TABLET EVERY DAY 90 tablet 1  . metoprolol succinate (TOPROL-XL) 50 MG 24 hr tablet Take 1 tablet (50 mg total) by mouth daily. 30 tablet 0  . metoprolol succinate (TOPROL-XL) 50 MG 24 hr tablet TAKE 1 TABLET BY MOUTH EVERY DAY WITH OR IMMEDIATELY FOLLOWING A MEAL 90 tablet 0  . omega-3 acid ethyl esters (LOVAZA) 1 G capsule Take 1 capsule (1 g total) by mouth 2 (two) times daily.    . potassium chloride (K-DUR) 10 MEQ tablet Take 1 tablet (10 mEq total) by mouth daily. 60 tablet 5   No current facility-administered medications for  this visit.    OBJECTIVE: Middle-aged [place] American woman in no acute distress Filed Vitals:   06/13/15 1031  BP: 201/70  Pulse: 68  Temp: 98.5 F (36.9 C)  Resp: 18     Body mass index is 26.08 kg/(m^2).    ECOG FS: 0  Filed Weights   06/13/15 1031  Weight: 147 lb 3.2 oz (66.769 kg)   Sclerae unicteric, EOMs intact Oropharynx clear, dentition in good repair No cervical or supraclavicular adenopathy Lungs no rales or rhonchi Heart regular rate and rhythm Abd soft, nontender, positive bowel sounds MSK no focal spinal tenderness, no upper extremity lymphedema Neuro: nonfocal, well oriented, appropriate affect Breasts: The right breast is status post mastectomy and there is no evidence of recurrence on the chest wall. The right axilla is benign. I do not palpate any masses in the left breast and  there are no skin or nipple changes. The left axilla however shows at least 2 firm masses which appear confluent and measure in excess of 3 cm. They are not tender or erythematous. There are not movable  LAB RESULTS: CBC    Component Value Date/Time   WBC 5.0 06/06/2015 0937   WBC 5.7 03/02/2013 1230   RBC 4.13 06/06/2015 0937   RBC 4.19 03/02/2013 1230   HGB 13.4 06/06/2015 0937   HGB 13.9 03/02/2013 1230   HCT 40.4 06/06/2015 0937   HCT 41.3 03/02/2013 1230   PLT 205 06/06/2015 0937   PLT 226 03/02/2013 1230   MCV 97.8 06/06/2015 0937   MCV 98.6 03/02/2013 1230   MCH 32.4 06/06/2015 0937   MCH 33.2 03/02/2013 1230   MCHC 33.2 06/06/2015 0937   MCHC 33.7 03/02/2013 1230   RDW 14.1 06/06/2015 0937   RDW 14.0 03/02/2013 1230   LYMPHSABS 1.7 06/06/2015 0937   LYMPHSABS 1.7 03/02/2013 1230   MONOABS 0.5 06/06/2015 0937   MONOABS 0.7 03/02/2013 1230   EOSABS 0.0 06/06/2015 0937   EOSABS 0.1 03/02/2013 1230   BASOSABS 0.0 06/06/2015 0937   BASOSABS 0.0 03/02/2013 1230    CMP     Component Value Date/Time   NA 143 06/06/2015 0937   NA 141 03/02/2013 1230   K 2.9*  06/06/2015 0937   K 3.8 03/02/2013 1230   CL 104 03/02/2013 1230   CL 107 01/11/2013 0853   CO2 28 06/06/2015 0937   CO2 24 03/02/2013 1230   GLUCOSE 115 06/06/2015 0937   GLUCOSE 92 03/02/2013 1230   GLUCOSE 123* 01/11/2013 0853   BUN 11.2 06/06/2015 0937   BUN 11 03/02/2013 1230   CREATININE 0.8 06/06/2015 0937   CREATININE 0.71 03/02/2013 1230   CALCIUM 9.6 06/06/2015 0937   CALCIUM 9.3 03/02/2013 1230   PROT 7.2 06/06/2015 0937   PROT 7.1 03/02/2013 1230   ALBUMIN 3.7 06/06/2015 0937   ALBUMIN 3.9 03/02/2013 1230   AST 18 06/06/2015 0937   AST 30 03/02/2013 1230   ALT 21 06/06/2015 0937   ALT 16 03/02/2013 1230   ALKPHOS 65 06/06/2015 0937   ALKPHOS 78 03/02/2013 1230   BILITOT 0.61 06/06/2015 0937   BILITOT 0.6 03/02/2013 1230   GFRNONAA 88* 03/02/2013 1230   GFRAA >90 03/02/2013 1230       STUDIES: No results found.  ASSESSMENT: 69 y.o.  woman   (1)  s/p Right breast upper inner quadrant and axillary node biopsy 03/08/2012 for a clinical T4, N1-2', stage IIIB invasive ductal carcinoma, grade 3, triple negative, with an MIB-1 of 93%.  (2) definitive staging and treatment delayed as patient canceled staging studies and tried alternative treatments; with eventual progression  (3) neoadjuvant chemotherapy started 08/23/12  (a) received two cycles of neoadjuvant cyclophosphamide, docetaxel, doxorubicin ["TAC"] at standard doses with neulasta support; refused neulasta with cycles 2 and 4; tolerated treatment poorly  (b) docetaxel was held for final 4 cycles; completed cycle 6 neoadjuvant chemotherapy 12/27/2012    (4) status post right modified radical mastectomy 03/13/2013 showing no residual carcinoma in the breast, but nine of 9 axillary lymph nodes sampled involved with macro metastases (ypTX, ypN2, stage IIIA)-- repeat prognostic panel again triple negative  (5) radiation therapy completed 05/31/2013  (6) patient opted against reconstruction  (7)  left axillary adenopathy noted on mammography June 2015, confirmed on CT scan November 2015  (a) patient refuses further workup or treatment  PLAN: Arley's left axillary  adenopathy is more prominent by palpation. She generally appears well otherwise and reports no symptoms related to this.  I again urged her to let me biopsy the left axillary adenopathy. Possibly it is not cancer, and could be treated without chemotherapy.  However she intends to be cured by faith alone. She tells me she regrets having had any treatment of her right-sided breast cancer. She is "not going to make that mistake again". She is agreeable to coming to see me, but she does not want any radiology, biopsy, treatment, or other intervention other than prayer.  We discussed her low potassium and she was started on supplementation for that last week.  She will see me again in 6 months. She knows to call for any problems or concerned that may occur before then.   Chauncey Cruel, MD    06/13/2015

## 2015-06-15 DIAGNOSIS — N6320 Unspecified lump in the left breast, unspecified quadrant: Secondary | ICD-10-CM | POA: Insufficient documentation

## 2015-07-20 ENCOUNTER — Other Ambulatory Visit: Payer: Self-pay | Admitting: Oncology

## 2015-10-20 ENCOUNTER — Other Ambulatory Visit: Payer: Self-pay | Admitting: Oncology

## 2015-11-29 ENCOUNTER — Telehealth: Payer: Self-pay | Admitting: Oncology

## 2015-11-29 NOTE — Telephone Encounter (Signed)
[facility]AL - R/S 5/16 F/U TO 6/28 ADN 5/9 LAB TO 6/21. SPOKE WITH PATIENT SHE IS AWARE AND PER PATIENT REQUEST ALSO MAILED SCHEDULE.

## 2015-12-03 ENCOUNTER — Other Ambulatory Visit: Payer: Medicare Other

## 2015-12-10 ENCOUNTER — Ambulatory Visit: Payer: Medicare Other | Admitting: Oncology

## 2015-12-16 ENCOUNTER — Ambulatory Visit (HOSPITAL_BASED_OUTPATIENT_CLINIC_OR_DEPARTMENT_OTHER): Payer: Medicare Other | Admitting: Nurse Practitioner

## 2015-12-16 ENCOUNTER — Other Ambulatory Visit: Payer: Self-pay

## 2015-12-16 ENCOUNTER — Ambulatory Visit (HOSPITAL_COMMUNITY)
Admission: RE | Admit: 2015-12-16 | Discharge: 2015-12-16 | Disposition: A | Discharge status: Home / Self Care | Payer: Medicare Other | Source: Ambulatory Visit | Attending: Nurse Practitioner | Admitting: Nurse Practitioner

## 2015-12-16 ENCOUNTER — Other Ambulatory Visit (HOSPITAL_BASED_OUTPATIENT_CLINIC_OR_DEPARTMENT_OTHER): Payer: Medicare Other

## 2015-12-16 ENCOUNTER — Telehealth: Payer: Self-pay | Admitting: Nurse Practitioner

## 2015-12-16 ENCOUNTER — Telehealth: Payer: Self-pay | Admitting: *Deleted

## 2015-12-16 VITALS — BP 210/82 | HR 67 | Temp 98.4°F | Resp 18 | Ht 63.0 in | Wt 144.8 lb

## 2015-12-16 DIAGNOSIS — C50411 Malignant neoplasm of upper-outer quadrant of right female breast: Secondary | ICD-10-CM

## 2015-12-16 DIAGNOSIS — I890 Lymphedema, not elsewhere classified: Secondary | ICD-10-CM

## 2015-12-16 DIAGNOSIS — C773 Secondary and unspecified malignant neoplasm of axilla and upper limb lymph nodes: Secondary | ICD-10-CM

## 2015-12-16 DIAGNOSIS — M7989 Other specified soft tissue disorders: Secondary | ICD-10-CM

## 2015-12-16 DIAGNOSIS — I10 Essential (primary) hypertension: Secondary | ICD-10-CM

## 2015-12-16 DIAGNOSIS — Z9011 Acquired absence of right breast and nipple: Secondary | ICD-10-CM | POA: Diagnosis not present

## 2015-12-16 DIAGNOSIS — C50211 Malignant neoplasm of upper-inner quadrant of right female breast: Secondary | ICD-10-CM | POA: Diagnosis not present

## 2015-12-16 DIAGNOSIS — R918 Other nonspecific abnormal finding of lung field: Secondary | ICD-10-CM | POA: Diagnosis not present

## 2015-12-16 DIAGNOSIS — E876 Hypokalemia: Secondary | ICD-10-CM

## 2015-12-16 DIAGNOSIS — N6320 Unspecified lump in the left breast, unspecified quadrant: Secondary | ICD-10-CM

## 2015-12-16 LAB — COMPREHENSIVE METABOLIC PANEL
ALBUMIN: 3.8 g/dL (ref 3.5–5.0)
ALK PHOS: 53 U/L (ref 40–150)
ALT: 16 U/L (ref 0–55)
ANION GAP: 10 meq/L (ref 3–11)
AST: 17 U/L (ref 5–34)
BILIRUBIN TOTAL: 0.46 mg/dL (ref 0.20–1.20)
BUN: 12.8 mg/dL (ref 7.0–26.0)
CALCIUM: 9.5 mg/dL (ref 8.4–10.4)
CO2: 29 mEq/L (ref 22–29)
CREATININE: 0.8 mg/dL (ref 0.6–1.1)
Chloride: 104 mEq/L (ref 98–109)
EGFR: 82 mL/min/{1.73_m2} — ABNORMAL LOW (ref >90)
Glucose: 97 mg/dl (ref 70–140)
Potassium: 3.2 mEq/L — ABNORMAL LOW (ref 3.5–5.1)
Sodium: 143 mEq/L (ref 136–145)
Total Protein: 7.7 g/dL (ref 6.4–8.3)

## 2015-12-16 LAB — CBC WITH DIFFERENTIAL/PLATELET
BASO%: 1 % (ref 0.0–2.0)
BASOS ABS: 0.1 10*3/uL (ref 0.0–0.1)
EOS%: 0.7 % (ref 0.0–7.0)
Eosinophils Absolute: 0 10*3/uL (ref 0.0–0.5)
HEMATOCRIT: 40.6 % (ref 34.8–46.6)
HEMOGLOBIN: 13.2 g/dL (ref 11.6–15.9)
LYMPH#: 1.9 10*3/uL (ref 0.9–3.3)
LYMPH%: 30.7 % (ref 14.0–49.7)
MCH: 31.7 pg (ref 25.1–34.0)
MCHC: 32.6 g/dL (ref 31.5–36.0)
MCV: 97.2 fL (ref 79.5–101.0)
MONO#: 0.7 10*3/uL (ref 0.1–0.9)
MONO%: 11.2 % (ref 0.0–14.0)
NEUT#: 3.4 10*3/uL (ref 1.5–6.5)
NEUT%: 56.4 % (ref 38.4–76.8)
PLATELETS: 256 10*3/uL (ref 145–400)
RBC: 4.18 10*6/uL (ref 3.70–5.45)
RDW: 13.9 % (ref 11.2–14.5)
WBC: 6.1 10*3/uL (ref 3.9–10.3)

## 2015-12-16 MED ORDER — HYDROCODONE-ACETAMINOPHEN 5-325 MG PO TABS: 1.0000 | ORAL_TABLET | Freq: Four times a day (QID) | ORAL | Status: DC | PRN

## 2015-12-16 MED ORDER — IOPAMIDOL (ISOVUE-300) INJECTION 61%
75.0000 mL | Freq: Once | INTRAVENOUS | Status: AC | PRN
  Administered 2015-12-16: 75 mL via INTRAVENOUS

## 2015-12-16 NOTE — Progress Notes (Signed)
*  Preliminary Results* Left upper extremity venous duplex completed. There is no obvious evidence of deep or superficial vein thrombosis involving the visualized left upper extremity veins.   Incidental finding: There is a heterogenous area of the left lateral clavicle area that measures 3.4 x 0.9 x 1.3cm that exhibits internal vascularity with low resistant flow; etiology is unknown.  There is also a heterogenous area of the left axilla that measures 3.9 x 2.5 x 2.7cm that exhibits internal vascularity with low resistant flow; etiology is unknown.  Preliminary results discussed with Drue Second, NP.  12/16/2015 4:12 PM Maudry Mayhew, RVT, RDCS, RDMS

## 2015-12-16 NOTE — Telephone Encounter (Signed)
Writer spoke to patient who will be seeing Cyndee, NP in [facility] to further investigate symptoms related to her left breast.

## 2015-12-16 NOTE — Telephone Encounter (Signed)
Pt added per pof

## 2015-12-16 NOTE — Telephone Encounter (Signed)
VM message received from patient stating that she was having pain and needed something stronger to relief this pain other than tylenol and ibuprofen.  TC back to patient. She states the pain is mostly in her left breast which is also swollen, tender to the touch. She states she also has some puffiness in her left axilla/ lymph node area and down her arm. She is not scheduled to see Dr. Jana Hakim until the end of June.  She is asking asking for a stronger pain medication though prefers not a strong narcotic.

## 2015-12-17 ENCOUNTER — Ambulatory Visit (HOSPITAL_BASED_OUTPATIENT_CLINIC_OR_DEPARTMENT_OTHER): Payer: Medicare Other | Admitting: Oncology

## 2015-12-17 ENCOUNTER — Telehealth: Payer: Self-pay | Admitting: Oncology

## 2015-12-17 ENCOUNTER — Encounter: Payer: Self-pay | Admitting: Nurse Practitioner

## 2015-12-17 VITALS — BP 180/93 | HR 73 | Temp 98.8°F | Resp 18 | Ht 63.0 in | Wt 145.1 lb

## 2015-12-17 DIAGNOSIS — N6320 Unspecified lump in the left breast, unspecified quadrant: Secondary | ICD-10-CM

## 2015-12-17 DIAGNOSIS — I890 Lymphedema, not elsewhere classified: Secondary | ICD-10-CM | POA: Diagnosis not present

## 2015-12-17 DIAGNOSIS — N63 Unspecified lump in breast: Secondary | ICD-10-CM | POA: Diagnosis not present

## 2015-12-17 DIAGNOSIS — C50411 Malignant neoplasm of upper-outer quadrant of right female breast: Secondary | ICD-10-CM | POA: Diagnosis not present

## 2015-12-17 DIAGNOSIS — C773 Secondary and unspecified malignant neoplasm of axilla and upper limb lymph nodes: Secondary | ICD-10-CM | POA: Diagnosis not present

## 2015-12-17 LAB — CANCER ANTIGEN 27.29: CA 27.29: 164.3 U/mL — ABNORMAL HIGH (ref 0.0–38.6)

## 2015-12-17 LAB — CANCER ANTIGEN 27-29 (PARALLEL TESTING): CA 27.29: 162 U/mL — AB (ref <38)

## 2015-12-17 MED ORDER — HYDROCODONE-ACETAMINOPHEN 5-325 MG PO TABS: 1.0000 | ORAL_TABLET | Freq: Four times a day (QID) | ORAL | Status: DC | PRN

## 2015-12-17 NOTE — Assessment & Plan Note (Signed)
Patient is status post right breast mastectomy in the past.  She is currently undergoing observation only.  Patient plans to return to the [facility] tomorrow to review her scan results with Dr. Jana Hakim.  She is currently scheduled for labs only on 01/15/2016; and for follow-up visit with Dr. Jana Hakim on 01/22/2016.  Since Dr. Jana Hakim will see the patient tomorrow; will need to confirm when the next appropriate labs and follow-up visit will need to be scheduled.  Advised patient would confirm; and then notify patient of new appointments.

## 2015-12-17 NOTE — Telephone Encounter (Signed)
Spoke with patient to confirm 6/1 appt at 3 pm at [facility] and 6/2 appt with GM at 3 pm

## 2015-12-17 NOTE — Progress Notes (Signed)
Narcotic prescription- vicodin in the folder over in the injection room.

## 2015-12-17 NOTE — Assessment & Plan Note (Signed)
Potassium was 3.2 with labs drawn today.  Patient was recently given a prescription for potassium to take as directed.  Will continue to monitor.

## 2015-12-17 NOTE — Progress Notes (Signed)
Patient ID: Janice Powell, female   DOB: October 20, 1945, 70 y.o.   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 More recently her husband twisted her arm to get the mass looked at, and she brought it to Dr. Pennie Powell attention. Diagnostic mammography and right ultrasonography at [facility] 03/02/2012 showed a 6.78 cm irregular mass in the inner aspect of the right breast. There was nipple retraction and erythema around the nipple. The largest lymph node was noted in the right axilla. By ultrasound the large irregular hypoechoic mass was noted in the breast with multiple enlarged axillary lymph nodes. Physical exam confirmed a hard breast with erythema around the right nipple extending inferiorly. The nipple is slightly inverted.  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   Her subsequent history is as detailed below  INTERVAL HISTORY: Janice Powell returns today for followup of her triple negative breast cancer. She called [place] yesterday to complain of left upper extremity swelling and left breast swelling and pain. She was evaluated by our symptom management nurse practitioner who obtained a Doppler ultrasound of her left upper extremity, showing no evidence of a clot. She then set her up for CT of the chest with contrast, 12/16/2015, which shows progressive left axillary and subpectoral adenopathy with the notes being increased in number and size. There is also new soft tissue  fullness throughout the left breast, with skin thickening. There is no evidence of lung or liver involvement. The patient was started on hydrocodone and took this twice yesterday with good pain control. She is here to discuss these results.  REVIEW OF SYSTEMS: Aside from pain in the left breast and concerns with the swelling, she is generally very stable. She has had no unusual headaches, visual changes, nausea, or vomiting. She is not yet constipated from the hydrocodone. 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 A detailed review of systems today was otherwise stable  PAST MEDICAL HISTORY: Past Medical History  Diagnosis Date  . Hypertension   . Pneumonia     hx  . GERD (gastroesophageal reflux disease)     occ  . Osteopenia   . Breast cancer ([place]) 03/08/13    right breast - Invasive Ductal Carcinoma   . S/P radiation therapy     1) Right Chest Wall and IM nodes / 50 Gy in 25 fractions /2) Right Supraclavicular fossa / 50 Gy in 25 fractions/1) Right Chest Wall and IM nodes / 50 Gy in 25 fractions /2) Right Supraclavicular fossa / 50 Gy in 25 fractions/3) Right Posterior Axillary boost / 6.55 Gy in 25 fractions/4) Right Chest Wall Scar boost / 10 Gy in 5 fractions      . Hx antineoplastic chemotherapy     two cycles of neoadjuvant cyclophosphamide, docetaxel, doxorubicin ["TAC"] at standard doses with neulasta support; refused neulasta with cycles 2 and 4; tolerated treatment poorly (b) docetaxel was held  for final 4 cycles; completed cycle 6 neoadjuvant chemotherapy 12/27/2012    GERD osteopenia  PAST SURGICAL HISTORY: Past Surgical History  Procedure Laterality Date  . Appendectomy  1992  . Tubal ligation  1979  . Cholecystectomy  2000  . Portacath placement  08/15/2012    Procedure: INSERTION PORT-A-CATH;  Surgeon: Janice Hector, MD;  Location: [place];  Service: General;  Laterality: Left;  [place] Kitchen  Mastectomy Right 03/13/2013  . Breast biopsy Right   . Port-a-cath removal  03/13/2013  . Mastectomy modified radical Right 03/13/2013    Procedure: MASTECTOMY MODIFIED RADICAL;  Surgeon: Janice Hector, MD;  Location: [place];  Service: General;  Laterality: Right;  . Port-a-cath removal Left 03/13/2013    Procedure: REMOVAL PORT-A-CATH;  Surgeon: Janice Hector, MD;  Location: [place];  Service: General;  Laterality: Left;    FAMILY HISTORY Family History  Problem Relation Age of Onset  . Heart disease Mother    the patient's father died in his sleep at age 30. The patient's mother died at the age of 5 from a myocardial infarction. The patient has one brother and 2 sisters, all surviving. There is no history of breast or ovarian cancer in the family.  GYNECOLOGIC HISTORY:  (Reviewed 12/07/2013) Menarche age 69, first live birth age 56, she is GX P5, menopause age 7. She did not take hormone replacement.  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 Her husband Janice Powell worked in the post office 33 years, and is also now retired. He is a former Company secretary. Son Janice Powell lives in [place] and manages an [employer] store. Daughter Janice Powell also lives in [place] and manages the IT Department at [employer]. Son Janice Powell is a Engineer, materials. Daughter Janice Powell is a Actuary for [employer]. Son Janice Powell is an Actuary. The patient has 8 grandchildren. She attends [employer]   ADVANCED DIRECTIVES: Not in place  HEALTH MAINTENANCE:  (Updated 12/07/2013) Social History  Substance Use Topics  . Smoking status: Never Smoker   . Smokeless tobacco: Never Used  . Alcohol Use: No     Colonoscopy: Not on file  PAP: Not on file  Bone density:  January 2011   Lipid panel:  Not on file/ Janice Powell  No Known Allergies  Current Outpatient Prescriptions  Medication Sig Dispense Refill  .  HYDROcodone-acetaminophen (NORCO/VICODIN) 5-325 MG tablet Take 1-2 tablets by mouth every 6 (six) hours as needed for moderate pain. 30 tablet 0  . lisinopril (PRINIVIL,ZESTRIL) 10 MG tablet TAKE 1 TABLET EVERY DAY 90 tablet 1  . metoprolol succinate (TOPROL-XL) 50 MG 24 hr tablet Take 1 tablet (50 mg total) by mouth daily. 30 tablet 0  . metoprolol succinate (TOPROL-XL) 50 MG 24 hr tablet TAKE 1 TABLET BY MOUTH EVERY DAY WITH OR IMMEDIATELY FOLLOWING A MEAL 90 tablet 0  . omega-3 acid ethyl esters (LOVAZA) 1 G capsule Take 1 capsule (1 g total) by mouth 2 (two) times daily.    . potassium chloride (K-DUR) 10 MEQ tablet Take 1 tablet (10 mEq total) by mouth daily. 60 tablet 5   No current facility-administered medications for this visit.    OBJECTIVE: Middle-aged [place] American woman Who appears stated age 54 Vitals:   12/17/15 0932  BP: 180/93  Pulse: 73  Temp: 98.8 F (37.1 C)  Resp: 18     Body mass index is  25.71 kg/(m^2).    ECOG FS: 1 Filed Weights   12/17/15 0932  Weight: 145 lb 1.6 oz (65.817 kg)   Sclerae unicteric, pupils round and equal Oropharynx clear and moist-- no thrush or other lesions No cervical or supraclavicular adenopathy Lungs no rales or rhonchi Heart regular rate and rhythm Abd soft, nontender, positive bowel sounds MSK no focal spinal tenderness, grade 1 left upper extremity lymphedema Neuro: nonfocal, well oriented, appropriate affect Breasts: The right breast is status post mastectomy. There is no evidence of local recurrence. The right axilla is benign. The left breast appears engorged and there is an area of erythema surrounding the areola, imaged below. The left axilla has fixed hard adenopathy.  Photo of left breast 12/17/2015    LAB RESULTS: CBC    Component Value Date/Time   WBC 6.1 12/16/2015 1614   WBC 5.7 03/02/2013 1230   RBC 4.18 12/16/2015 1614   RBC 4.19 03/02/2013 1230   HGB 13.2 12/16/2015 1614   HGB 13.9 03/02/2013 1230    HCT 40.6 12/16/2015 1614   HCT 41.3 03/02/2013 1230   PLT 256 12/16/2015 1614   PLT 226 03/02/2013 1230   MCV 97.2 12/16/2015 1614   MCV 98.6 03/02/2013 1230   MCH 31.7 12/16/2015 1614   MCH 33.2 03/02/2013 1230   MCHC 32.6 12/16/2015 1614   MCHC 33.7 03/02/2013 1230   RDW 13.9 12/16/2015 1614   RDW 14.0 03/02/2013 1230   LYMPHSABS 1.9 12/16/2015 1614   LYMPHSABS 1.7 03/02/2013 1230   MONOABS 0.7 12/16/2015 1614   MONOABS 0.7 03/02/2013 1230   EOSABS 0.0 12/16/2015 1614   EOSABS 0.1 03/02/2013 1230   BASOSABS 0.1 12/16/2015 1614   BASOSABS 0.0 03/02/2013 1230    CMP     Component Value Date/Time   NA 143 12/16/2015 1614   NA 141 03/02/2013 1230   K 3.2* 12/16/2015 1614   K 3.8 03/02/2013 1230   CL 104 03/02/2013 1230   CL 107 01/11/2013 0853   CO2 29 12/16/2015 1614   CO2 24 03/02/2013 1230   GLUCOSE 97 12/16/2015 1614   GLUCOSE 92 03/02/2013 1230   GLUCOSE 123* 01/11/2013 0853   BUN 12.8 12/16/2015 1614   BUN 11 03/02/2013 1230   CREATININE 0.8 12/16/2015 1614   CREATININE 0.71 03/02/2013 1230   CALCIUM 9.5 12/16/2015 1614   CALCIUM 9.3 03/02/2013 1230   PROT 7.7 12/16/2015 1614   PROT 7.1 03/02/2013 1230   ALBUMIN 3.8 12/16/2015 1614   ALBUMIN 3.9 03/02/2013 1230   AST 17 12/16/2015 1614   AST 30 03/02/2013 1230   ALT 16 12/16/2015 1614   ALT 16 03/02/2013 1230   ALKPHOS 53 12/16/2015 1614   ALKPHOS 78 03/02/2013 1230   BILITOT 0.46 12/16/2015 1614   BILITOT 0.6 03/02/2013 1230   GFRNONAA 88* 03/02/2013 1230   GFRAA >90 03/02/2013 1230       STUDIES: Ct Chest W Contrast  12/16/2015  CLINICAL DATA:  Restaging of breast cancer, diagnosed in 2014. Right-sided mastectomy and lymph node removal. Left breast and arm swelling for 2-3 weeks. EXAM: CT CHEST WITH CONTRAST TECHNIQUE: Multidetector CT imaging of the chest was performed during intravenous contrast administration. CONTRAST:  20m ISOVUE-300 IOPAMIDOL (ISOVUE-300) INJECTION 61% COMPARISON:   06/19/2014.  Clinic note of 06/13/2015 FINDINGS: Mediastinum/Nodes: No supraclavicular adenopathy. Mild cardiomegaly. No pericardial effusion. No central pulmonary embolism, on this non-dedicated study. No middle mediastinal or hilar adenopathy. No internal mammary adenopathy. Lungs/Pleura: Trace left pleural thickening is  similar. Minimal motion degradation throughout. Anterior right upper lobe radiation fibrosis. Upper abdomen: Cholecystectomy. Normal imaged portions of the liver, spleen, stomach, pancreas, right adrenal gland, and left kidney. Mild left adrenal thickening is similar. Upper pole right renal scarring. Musculoskeletal: Right mastectomy and axillary node dissection. Significant increase an left axillary adenopathy. Increased number and size of left axillary nodes. Index node measures 2.3 x 2.5 cm on image 30/series 2. compare 1.9 x 2.7 cm on the prior. More cephalad left axillary node measures 2.4 cm on image 24/series 2 versus 1.6 cm on the prior. Left breast soft tissue fullness and skin thickening are new. Left subpectoral node measures 8 mm on image 13/series 2 versus 7 mm on the prior. IMPRESSION: 1. Progressive left axillary and subpectoral adenopathy, as evidenced by increased size and number of nodes. New soft tissue fullness throughout the left breast with skin thickening. Constellation of findings are suspicious for progressive metastatic disease and possible inflammatory type left sided breast cancer. Alternatively, left breast edema and skin thickening could be related to venous compression from axillary adenopathy. Mammographic correlation suggested. 2. Status post right mastectomy and axillary node dissection. Electronically Signed   By: Abigail Miyamoto M.D.   On: 12/16/2015 17:34    ASSESSMENT: 70 y.o. [place] woman   (1)  s/p Right breast upper inner quadrant and axillary node biopsy 03/08/2012 for a clinical T4, N1-2', stage IIIB invasive ductal carcinoma, grade 3, triple  negative, with an MIB-1 of 93%.  (2) definitive staging and treatment delayed as patient canceled staging studies and tried alternative treatments; with eventual progression  (3) neoadjuvant chemotherapy started 08/23/12  (a) received two cycles of neoadjuvant cyclophosphamide, docetaxel, doxorubicin ["TAC"] at standard doses with neulasta support; refused neulasta with cycles 2 and 4; tolerated treatment poorly  (b) docetaxel was held for final 4 cycles; completed cycle 6 neoadjuvant chemotherapy 12/27/2012    (4) status post right modified radical mastectomy 03/13/2013 showing no residual carcinoma in the breast, but nine of 9 axillary lymph nodes sampled involved with macro metastases (ypTX, ypN2, stage IIIA)-- repeat prognostic panel again triple negative  (5) radiation therapy completed 05/31/2013  (6) patient opted against reconstruction  (7) left axillary adenopathy noted on mammography June 2015, confirmed on CT scan November 2015  (a) patient refuses further workup or treatment  PLAN: I gave left lead a copy of her CT scan as well as retaining information on her general situation. We have been following her left axillary adenopathy for some time and she has refused biopsies previously. At this point however she is symptomatic from it and she is willing to consider more options.  She understands her breast cancer when previously biopsied was triple negative. That means our only a chance for systemic treatment would be chemotherapy. However if we biopsy her left axillary adenopathy and it proves to be HER-2 or estrogen receptor positive, we would have more treatment options. That would also tell us whether we are dealing with cancer or not.  At this point left lead a is willing to consider biopsy OF this up for later this week. We should have results of the prognostic panel a few days later so I'm going to see her again in June 2 to discuss those results.  If the cancer is indeed still  triple negative, I would spread recommend carboplatin and gemcitabine events days 1 and 8 every 21 days. I think this would have a fair chance of controlling the disease to the point that the  left upper extremity lymphedema and left breast edema might resolve. Of course if we are dealing with estrogen receptor positive or HER-2 positive disease we could consider anti-estrogen and anti-HER-2 dystrophic therapies.  Another possibility for this patient if she wishes to avoid chemotherapy would be to consider the [facility] CC 1525 study. This does require one prior treatment regimen in the metastatic setting. We will discuss that at the next visit  In the meantime I refilled her hydrocodone, which appears to be working to control her pain, and asked her to start MiraLAX on a daily basis. I referred her to physical therapy for instruction and management of her left upper extremity lymphedema  Shefali knows to call for any problems that may develop before her next visit here.     Chauncey Cruel, MD    12/17/2015

## 2015-12-17 NOTE — Progress Notes (Signed)
Patient ID: Janice Powell, female   DOB: May 29, 1946, 70 y.o.   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 noted a mass in her right breast sometime in 2011. 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 More recently her husband twisted her arm to get the mass looked at, and she brought it to Dr. Pennie Banter attention. Diagnostic mammography and right ultrasonography at [facility] 03/02/2012 showed a 6.78 cm irregular mass in the inner aspect of the right breast. There was nipple retraction and erythema around the nipple. The largest lymph node was noted in the right axilla. By ultrasound the large irregular hypoechoic mass was noted in the breast with multiple enlarged axillary lymph nodes. Physical exam confirmed a hard breast with erythema around the right nipple extending inferiorly. The nipple is slightly inverted.  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   Her subsequent history is as detailed below  INTERVAL HISTORY: Marcile called 12/16/2015 to report that she was having more pain not relieved by Tylenol and ibuprofen. The pain localized to the left breast, which was swollen and tender. The left upper arm was also swollen. The patient was referred to our symptom management nurse who found the patient's left breast to be swollen, erythematous, and tender. The left upper extremity was also swollen. There was significant left axillary adenopathy which was easily palpable.  The patient was scheduled for left upper extremity venous Doppler  12/16/2015 which found no evidence of a clot. However there were significant findings in the left axilla and left lateral clavicular area, which were better evaluated by CT scan obtained the same day. The chest CT findings no evidence of a pulmonary embolus and no evidence of lung or liver involvement. However there was significant increase in the patient's left axillary adenopathy with left breast fullness and skin thickening, which were negative.  We asked Lefleta to return this morning for further evaluation and management of her progressive breast cancer  REVIEW OF SYSTEMS: A detailed review of systems today was otherwise negative  PAST MEDICAL HISTORY: Past Medical History  Diagnosis Date  . Hypertension   . Pneumonia     hx  . GERD (gastroesophageal reflux disease)     occ  . Osteopenia   . Breast cancer 03/08/13    right breast - Invasive Ductal Carcinoma   . S/P radiation therapy     1) Right Chest Wall and IM nodes / 50 Gy in 25 fractions /2) Right Supraclavicular fossa / 50 Gy in 25 fractions/1) Right Chest Wall and IM nodes / 50 Gy in 25 fractions /2) Right Supraclavicular fossa / 50 Gy in 25 fractions/3) Right Posterior Axillary boost / 6.55 Gy in 25 fractions/4) Right Chest Wall Scar boost / 10 Gy in 5 fractions      . Hx antineoplastic chemotherapy     two cycles of neoadjuvant cyclophosphamide, docetaxel, doxorubicin ["TAC"] at standard doses with neulasta support; refused neulasta with cycles 2 and 4; tolerated treatment poorly (b) docetaxel was held for final 4 cycles; completed cycle  6 neoadjuvant chemotherapy 12/27/2012    GERD osteopenia  PAST SURGICAL HISTORY: Past Surgical History  Procedure Laterality Date  . Appendectomy  1992  . Tubal ligation  1979  . Cholecystectomy  2000  . Portacath placement  08/15/2012    Procedure: INSERTION PORT-A-CATH;  Surgeon: [name], MD;  Location: [place];  Service: General;  Laterality: Left;  Marland Kitchen Mastectomy Right 03/13/2013   . Breast biopsy Right   . Port-a-cath removal  03/13/2013  . Mastectomy modified radical Right 03/13/2013    Procedure: MASTECTOMY MODIFIED RADICAL;  Surgeon: [name], MD;  Location: [place];  Service: General;  Laterality: Right;  . Port-a-cath removal Left 03/13/2013    Procedure: REMOVAL PORT-A-CATH;  Surgeon: [name], MD;  Location: [place];  Service: General;  Laterality: Left;    FAMILY HISTORY Family History  Problem Relation Age of Onset  . Heart disease Mother    the patient's father died in his sleep at age 59. The patient's mother died at the age of 87 from a myocardial infarction. The patient has one brother and 2 sisters, all surviving. There is no history of breast or ovarian cancer in the family.  GYNECOLOGIC HISTORY:  (Reviewed 12/07/2013) Menarche age 23, first live birth age 43, she is GX P5, menopause age 50. She did not take hormone replacement.  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 Her husband [name] worked in the post office 40 years, and is also now retired. He is a former Company secretary. Son [name] lives in [place] and manages an [employer] store. Daughter Dorlene Gensheimer also lives in [place] and manages the IT Department at [employer]. Son Zeldy Ahlert III is a Engineer, materials. Daughter [name] is a Actuary for [employer]. Son [name] is an Actuary. The patient has 8 grandchildren. She attends [employer]   ADVANCED DIRECTIVES: Not in place  HEALTH MAINTENANCE:  (Updated 12/07/2013) Social History  Substance Use Topics  . Smoking status: Never Smoker   . Smokeless tobacco: Never Used  . Alcohol Use: No     Colonoscopy: Not on file  PAP: Not on file  Bone density:  January 2011   Lipid panel:  Not on file/ Dr. Shelia Media  No Known Allergies  Current Outpatient Prescriptions  Medication Sig Dispense Refill  . HYDROcodone-acetaminophen (NORCO/VICODIN)  5-325 MG tablet Take 1-2 tablets by mouth every 6 (six) hours as needed for moderate pain. 30 tablet 0  . lisinopril (PRINIVIL,ZESTRIL) 10 MG tablet TAKE 1 TABLET EVERY DAY 90 tablet 1  . metoprolol succinate (TOPROL-XL) 50 MG 24 hr tablet Take 1 tablet (50 mg total) by mouth daily. 30 tablet 0  . metoprolol succinate (TOPROL-XL) 50 MG 24 hr tablet TAKE 1 TABLET BY MOUTH EVERY DAY WITH OR IMMEDIATELY FOLLOWING A MEAL 90 tablet 0  . omega-3 acid ethyl esters (LOVAZA) 1 G capsule Take 1 capsule (1 g total) by mouth 2 (two) times daily.    . potassium chloride (K-DUR) 10 MEQ tablet Take 1 tablet (10 mEq total) by mouth daily. 60 tablet 5   No current facility-administered medications for this visit.    OBJECTIVE: Middle-aged [place] American woman in no acute distress Filed Vitals:   12/16/15 1353  BP: 210/82  Pulse: 67  Temp: 98.4 F (36.9 C)  Resp: 18     Body mass index is 25.66 kg/(m^2).    ECOG  FS: 0  Filed Weights   12/16/15 1353  Weight: 144 lb 12.8 oz (65.681 kg)   Sclerae unicteric, EOMs intact Oropharynx clear, dentition in good repair No cervical or supraclavicular adenopathy Lungs no rales or rhonchi Heart regular rate and rhythm Abd soft, nontender, positive bowel sounds MSK no focal spinal tenderness, no upper extremity lymphedema Neuro: nonfocal, well oriented, appropriate affect Breasts: The right breast is status post mastectomy and there is no evidence of recurrence on the chest wall. The right axilla is benign. I do not palpate any masses in the left breast and there are no skin or nipple changes. The left axilla however shows at least 2 firm masses which appear confluent and measure in excess of 3 cm. They are not tender or erythematous. There are not movable  LAB RESULTS: CBC    Component Value Date/Time   WBC 6.1 12/16/2015 1614   WBC 5.7 03/02/2013 1230   RBC 4.18 12/16/2015 1614   RBC 4.19 03/02/2013 1230   HGB 13.2 12/16/2015 1614   HGB 13.9 03/02/2013  1230   HCT 40.6 12/16/2015 1614   HCT 41.3 03/02/2013 1230   PLT 256 12/16/2015 1614   PLT 226 03/02/2013 1230   MCV 97.2 12/16/2015 1614   MCV 98.6 03/02/2013 1230   MCH 31.7 12/16/2015 1614   MCH 33.2 03/02/2013 1230   MCHC 32.6 12/16/2015 1614   MCHC 33.7 03/02/2013 1230   RDW 13.9 12/16/2015 1614   RDW 14.0 03/02/2013 1230   LYMPHSABS 1.9 12/16/2015 1614   LYMPHSABS 1.7 03/02/2013 1230   MONOABS 0.7 12/16/2015 1614   MONOABS 0.7 03/02/2013 1230   EOSABS 0.0 12/16/2015 1614   EOSABS 0.1 03/02/2013 1230   BASOSABS 0.1 12/16/2015 1614   BASOSABS 0.0 03/02/2013 1230    CMP     Component Value Date/Time   NA 143 12/16/2015 1614   NA 141 03/02/2013 1230   K 3.2* 12/16/2015 1614   K 3.8 03/02/2013 1230   CL 104 03/02/2013 1230   CL 107 01/11/2013 0853   CO2 29 12/16/2015 1614   CO2 24 03/02/2013 1230   GLUCOSE 97 12/16/2015 1614   GLUCOSE 92 03/02/2013 1230   GLUCOSE 123* 01/11/2013 0853   BUN 12.8 12/16/2015 1614   BUN 11 03/02/2013 1230   CREATININE 0.8 12/16/2015 1614   CREATININE 0.71 03/02/2013 1230   CALCIUM 9.5 12/16/2015 1614   CALCIUM 9.3 03/02/2013 1230   PROT 7.7 12/16/2015 1614   PROT 7.1 03/02/2013 1230   ALBUMIN 3.8 12/16/2015 1614   ALBUMIN 3.9 03/02/2013 1230   AST 17 12/16/2015 1614   AST 30 03/02/2013 1230   ALT 16 12/16/2015 1614   ALT 16 03/02/2013 1230   ALKPHOS 53 12/16/2015 1614   ALKPHOS 78 03/02/2013 1230   BILITOT 0.46 12/16/2015 1614   BILITOT 0.6 03/02/2013 1230   GFRNONAA 88* 03/02/2013 1230   GFRAA >90 03/02/2013 1230       STUDIES: Ct Chest W Contrast  12/16/2015  CLINICAL DATA:  Restaging of breast cancer, diagnosed in 2014. Right-sided mastectomy and lymph node removal. Left breast and arm swelling for 2-3 weeks. EXAM: CT CHEST WITH CONTRAST TECHNIQUE: Multidetector CT imaging of the chest was performed during intravenous contrast administration. CONTRAST:  4mL ISOVUE-300 IOPAMIDOL (ISOVUE-300) INJECTION 61% COMPARISON:   06/19/2014.  Clinic note of 06/13/2015 FINDINGS: Mediastinum/Nodes: No supraclavicular adenopathy. Mild cardiomegaly. No pericardial effusion. No central pulmonary embolism, on this non-dedicated study. No middle mediastinal or hilar adenopathy. No internal  mammary adenopathy. Lungs/Pleura: Trace left pleural thickening is similar. Minimal motion degradation throughout. Anterior right upper lobe radiation fibrosis. Upper abdomen: Cholecystectomy. Normal imaged portions of the liver, spleen, stomach, pancreas, right adrenal gland, and left kidney. Mild left adrenal thickening is similar. Upper pole right renal scarring. Musculoskeletal: Right mastectomy and axillary node dissection. Significant increase an left axillary adenopathy. Increased number and size of left axillary nodes. Index node measures 2.3 x 2.5 cm on image 30/series 2. compare 1.9 x 2.7 cm on the prior. More cephalad left axillary node measures 2.4 cm on image 24/series 2 versus 1.6 cm on the prior. Left breast soft tissue fullness and skin thickening are new. Left subpectoral node measures 8 mm on image 13/series 2 versus 7 mm on the prior. IMPRESSION: 1. Progressive left axillary and subpectoral adenopathy, as evidenced by increased size and number of nodes. New soft tissue fullness throughout the left breast with skin thickening. Constellation of findings are suspicious for progressive metastatic disease and possible inflammatory type left sided breast cancer. Alternatively, left breast edema and skin thickening could be related to venous compression from axillary adenopathy. Mammographic correlation suggested. 2. Status post right mastectomy and axillary node dissection. Electronically Signed   By: Abigail Miyamoto M.D.   On: 12/16/2015 17:34    ASSESSMENT: 70 y.o. [place] woman   (1)  s/p Right breast upper inner quadrant and axillary node biopsy 03/08/2012 for a clinical T4, N1-2', stage IIIB invasive ductal carcinoma, grade 3, triple  negative, with an MIB-1 of 93%.  (2) definitive staging and treatment delayed as patient canceled staging studies and tried alternative treatments; with eventual progression  (3) neoadjuvant chemotherapy started 08/23/12  (a) received two cycles of neoadjuvant cyclophosphamide, docetaxel, doxorubicin ["TAC"] at standard doses with neulasta support; refused neulasta with cycles 2 and 4; tolerated treatment poorly  (b) docetaxel was held for final 4 cycles; completed cycle 6 neoadjuvant chemotherapy 12/27/2012    (4) status post right modified radical mastectomy 03/13/2013 showing no residual carcinoma in the breast, but nine of 9 axillary lymph nodes sampled involved with macro metastases (ypTX, ypN2, stage IIIA)-- repeat prognostic panel again triple negative  (5) radiation therapy completed 05/31/2013  (6) patient opted against reconstruction  (7) left axillary adenopathy noted on mammography June 2015, confirmed on CT scan November 2015  (a) patient refuses further workup or treatment  PLAN: Lakyia's left axillary adenopathy is more prominent by palpation. She generally appears well otherwise and reports no symptoms related to this.  I again urged her to let me biopsy the left axillary adenopathy. Possibly it is not cancer, and could be treated without chemotherapy.  However she intends to be cured by faith alone. She tells me she regrets having had any treatment of her right-sided breast cancer. She is "not going to make that mistake again". She is agreeable to coming to see me, but she does not want any radiology, biopsy, treatment, or other intervention other than prayer.  We discussed her low potassium and she was started on supplementation for that last week.  She will see me again in 6 months. She knows to call for any problems or concerned that may occur before then.   Chauncey Cruel, MD    12/17/2015

## 2015-12-17 NOTE — Progress Notes (Signed)
SYMPTOM MANAGEMENT CLINIC    Chief Complaint: Left breast/chest wall/arm pain and edema  HPI:  Janice Powell 70 y.o. female diagnosed with right breast cancer; status post right mastectomy.  Currently undergoing observation only. Patient states that she developed some left chest wall/axilla/arm edema.  Approximate 2 weeks ago; and developed pain to the left chest wall and left arm approximate one week ago.  She denies any known injury or trauma to the site.  Exam today reveals edema to the entire left arm all the way down to the hand.  She also has what appears to be edema to the left chest wall surrounding the entire left breast as well.  The left breast appears very firm with some erythema surrounding the nipple.  There is also some firmness and questionable masses under the left axilla region as well.  The left breast and left axillary region is slightly tender.  There is no warmth to any of the sites.  Patient continues with full range of motion.  Patient was given a prescription for hydrocodone to take as directed for her breast/chest wall pain.  Reviewed all of these findings with Dr. Jana Hakim; he suggested the patient undergo a CT with contrast of the chest today for further evaluation of possible reoccurrence.  The plan is for the patient undergo the CT this afternoon; and to follow back up with Dr. Jana Hakim first thing in the morning to review the scan results.     No history exists.    Review of Systems  Skin:       Left breast, axilla, and arm edema and tenderness.  All other systems reviewed and are negative.   Past Medical History  Diagnosis Date  . Hypertension   . Pneumonia     hx  . GERD (gastroesophageal reflux disease)     occ  . Osteopenia   . Breast cancer ([place]) 03/08/13    right breast - Invasive Ductal Carcinoma   . S/P radiation therapy     1) Right Chest Wall and IM nodes / 50 Gy in 25 fractions /2) Right Supraclavicular fossa / 50 Gy in 25  fractions/1) Right Chest Wall and IM nodes / 50 Gy in 25 fractions /2) Right Supraclavicular fossa / 50 Gy in 25 fractions/3) Right Posterior Axillary boost / 6.55 Gy in 25 fractions/4) Right Chest Wall Scar boost / 10 Gy in 5 fractions      . Hx antineoplastic chemotherapy     two cycles of neoadjuvant cyclophosphamide, docetaxel, doxorubicin ["TAC"] at standard doses with neulasta support; refused neulasta with cycles 2 and 4; tolerated treatment poorly (b) docetaxel was held for final 4 cycles; completed cycle 6 neoadjuvant chemotherapy 12/27/2012      Past Surgical History  Procedure Laterality Date  . Appendectomy  1992  . Tubal ligation  1979  . Cholecystectomy  2000  . Portacath placement  08/15/2012    Procedure: INSERTION PORT-A-CATH;  Surgeon: [name], MD;  Location: [place] [facility];  Service: General;  Laterality: Left;  Marland Kitchen Mastectomy Right 03/13/2013  . Breast biopsy Right   . Port-a-cath removal  03/13/2013  . Mastectomy modified radical Right 03/13/2013    Procedure: MASTECTOMY MODIFIED RADICAL;  Surgeon: [name], MD;  Location: [place];  Service: General;  Laterality: Right;  . Port-a-cath removal Left 03/13/2013    Procedure: REMOVAL PORT-A-CATH;  Surgeon: [name], MD;  Location: [place];  Service: General;  Laterality: Left;    has Hypertension;  Hypokalemia; Breast cancer of upper-outer quadrant of right female breast ([place]); Atherosclerosis of coronary artery; Atherosclerosis of aorta ([place]); Breast mass, left; and Lymphedema of arm on her problem list.    has No Known Allergies.    Medication List       This list is accurate as of: 12/16/15 11:59 PM.  Always use your most recent med list.               HYDROcodone-acetaminophen 5-325 MG tablet  Commonly known as:  NORCO/VICODIN  Take 1-2 tablets by mouth every 6 (six) hours as needed for moderate pain.     lisinopril 10 MG tablet  Commonly known as:  PRINIVIL,ZESTRIL  TAKE 1 TABLET EVERY DAY      metoprolol succinate 50 MG 24 hr tablet  Commonly known as:  TOPROL-XL  Take 1 tablet (50 mg total) by mouth daily.     metoprolol succinate 50 MG 24 hr tablet  Commonly known as:  TOPROL-XL  TAKE 1 TABLET BY MOUTH EVERY DAY WITH OR IMMEDIATELY FOLLOWING A MEAL     omega-3 acid ethyl esters 1 g capsule  Commonly known as:  LOVAZA  Take 1 capsule (1 g total) by mouth 2 (two) times daily.     potassium chloride 10 MEQ tablet  Commonly known as:  K-DUR  Take 1 tablet (10 mEq total) by mouth daily.         PHYSICAL EXAMINATION  Oncology Vitals 12/16/2015 06/13/2015  Height 160 cm 160 cm  Weight 65.681 kg 66.769 kg  Weight (lbs) 144 lbs 13 oz 147 lbs 3 oz  BMI (kg/m2) 25.65 kg/m2 26.08 kg/m2  Temp 98.4 98.5  Pulse 67 68  Resp 18 18  SpO2 99 98  BSA (m2) 1.71 m2 1.72 m2   BP Readings from Last 2 Encounters:  12/16/15 210/82  06/13/15 201/70    Physical Exam  Constitutional: She is oriented to person, place, and time and well-developed, well-nourished, and in no distress.  HENT:  Head: Normocephalic and atraumatic.  Mouth/Throat: Oropharynx is clear and moist.  Eyes: Conjunctivae and EOM are normal. Pupils are equal, round, and reactive to light. Right eye exhibits no discharge. Left eye exhibits no discharge. No scleral icterus.  Neck: Normal range of motion. Neck supple. No JVD present. No tracheal deviation present. No thyromegaly present.  Cardiovascular: Normal rate, regular rhythm, normal heart sounds and intact distal pulses.   Pulmonary/Chest: Effort normal and breath sounds normal. No respiratory distress. She has no wheezes. She has no rales. She exhibits no tenderness.  Abdominal: Soft. Bowel sounds are normal. She exhibits no distension and no mass. There is no tenderness. There is no rebound and no guarding.  Musculoskeletal: Normal range of motion. She exhibits no edema or tenderness.  Lymphadenopathy:    She has no cervical adenopathy.  Neurological: She is  alert and oriented to person, place, and time. Gait normal.  Skin: Skin is warm and dry. No rash noted. There is erythema. No pallor.  Exam today reveals edema to the entire left arm all the way down to the hand.  She also has what appears to be edema to the left chest wall surrounding the entire left breast as well.  The left breast appears very firm with some erythema surrounding the nipple.  There is also some firmness and questionable masses under the left axilla region as well.  The left breast and left axillary region is slightly tender.  There is no warmth to  any of the sites.  Patient continues with full range of motion.    Psychiatric: Affect normal.  Nursing note and vitals reviewed.   LABORATORY DATA:. Appointment on 12/16/2015  Component Date Value Ref Range Status  . WBC 12/16/2015 6.1  3.9 - 10.3 10e3/uL Final  . NEUT# 12/16/2015 3.4  1.5 - 6.5 10e3/uL Final  . HGB 12/16/2015 13.2  11.6 - 15.9 g/dL Final  . HCT 12/16/2015 40.6  34.8 - 46.6 % Final  . Platelets 12/16/2015 256  145 - 400 10e3/uL Final  . MCV 12/16/2015 97.2  79.5 - 101.0 fL Final  . MCH 12/16/2015 31.7  25.1 - 34.0 pg Final  . MCHC 12/16/2015 32.6  31.5 - 36.0 g/dL Final  . RBC 12/16/2015 4.18  3.70 - 5.45 10e6/uL Final  . RDW 12/16/2015 13.9  11.2 - 14.5 % Final  . lymph# 12/16/2015 1.9  0.9 - 3.3 10e3/uL Final  . MONO# 12/16/2015 0.7  0.1 - 0.9 10e3/uL Final  . Eosinophils Absolute 12/16/2015 0.0  0.0 - 0.5 10e3/uL Final  . Basophils Absolute 12/16/2015 0.1  0.0 - 0.1 10e3/uL Final  . NEUT% 12/16/2015 56.4  38.4 - 76.8 % Final  . LYMPH% 12/16/2015 30.7  14.0 - 49.7 % Final  . MONO% 12/16/2015 11.2  0.0 - 14.0 % Final  . EOS% 12/16/2015 0.7  0.0 - 7.0 % Final  . BASO% 12/16/2015 1.0  0.0 - 2.0 % Final  . Sodium 12/16/2015 143  136 - 145 mEq/L Final  . Potassium 12/16/2015 3.2* 3.5 - 5.1 mEq/L Final  . Chloride 12/16/2015 104  98 - 109 mEq/L Final  . CO2 12/16/2015 29  22 - 29 mEq/L Final  . Glucose  12/16/2015 97  70 - 140 mg/dl Final   Glucose reference range is for nonfasting patients. Fasting glucose reference range is 70- 100.  Marland Kitchen BUN 12/16/2015 12.8  7.0 - 26.0 mg/dL Final  . Creatinine 12/16/2015 0.8  0.6 - 1.1 mg/dL Final  . Total Bilirubin 12/16/2015 0.46  0.20 - 1.20 mg/dL Final  . Alkaline Phosphatase 12/16/2015 53  40 - 150 U/L Final  . AST 12/16/2015 17  5 - 34 U/L Final  . ALT 12/16/2015 16  0 - 55 U/L Final  . Total Protein 12/16/2015 7.7  6.4 - 8.3 g/dL Final  . Albumin 12/16/2015 3.8  3.5 - 5.0 g/dL Final  . Calcium 12/16/2015 9.5  8.4 - 10.4 mg/dL Final  . Anion Gap 12/16/2015 10  3 - 11 mEq/L Final  . EGFR 12/16/2015 82* >90 ml/min/1.73 m2 Final   eGFR is calculated using the CKD-EPI Creatinine Equation (2009)  . CA 27.29 12/16/2015 164.3* 0.0 - 38.6 U/mL Final   [employer]/ACS methodology   *See photo of left breast site in chart.   RADIOGRAPHIC STUDIES: Ct Chest W Contrast  12/16/2015  CLINICAL DATA:  Restaging of breast cancer, diagnosed in 2014. Right-sided mastectomy and lymph node removal. Left breast and arm swelling for 2-3 weeks. EXAM: CT CHEST WITH CONTRAST TECHNIQUE: Multidetector CT imaging of the chest was performed during intravenous contrast administration. CONTRAST:  35m ISOVUE-300 IOPAMIDOL (ISOVUE-300) INJECTION 61% COMPARISON:  06/19/2014.  Clinic note of 06/13/2015 FINDINGS: Mediastinum/Nodes: No supraclavicular adenopathy. Mild cardiomegaly. No pericardial effusion. No central pulmonary embolism, on this non-dedicated study. No middle mediastinal or hilar adenopathy. No internal mammary adenopathy. Lungs/Pleura: Trace left pleural thickening is similar. Minimal motion degradation throughout. Anterior right upper lobe radiation fibrosis. Upper abdomen: Cholecystectomy. Normal imaged portions  of the liver, spleen, stomach, pancreas, right adrenal gland, and left kidney. Mild left adrenal thickening is similar. Upper pole right renal scarring.  Musculoskeletal: Right mastectomy and axillary node dissection. Significant increase an left axillary adenopathy. Increased number and size of left axillary nodes. Index node measures 2.3 x 2.5 cm on image 30/series 2. compare 1.9 x 2.7 cm on the prior. More cephalad left axillary node measures 2.4 cm on image 24/series 2 versus 1.6 cm on the prior. Left breast soft tissue fullness and skin thickening are new. Left subpectoral node measures 8 mm on image 13/series 2 versus 7 mm on the prior. IMPRESSION: 1. Progressive left axillary and subpectoral adenopathy, as evidenced by increased size and number of nodes. New soft tissue fullness throughout the left breast with skin thickening. Constellation of findings are suspicious for progressive metastatic disease and possible inflammatory type left sided breast cancer. Alternatively, left breast edema and skin thickening could be related to venous compression from axillary adenopathy. Mammographic correlation suggested. 2. Status post right mastectomy and axillary node dissection. Electronically Signed   By: Abigail Miyamoto M.D.   On: 12/16/2015 17:34    ASSESSMENT/PLAN:    Breast mass, left Patient states that she developed some left chest wall/axilla/arm edema.  Approximate 2 weeks ago; and developed pain to the left chest wall and left arm approximate one week ago.  She denies any known injury or trauma to the site.  Exam today reveals edema to the entire left arm all the way down to the hand.  She also has what appears to be edema to the left chest wall surrounding the entire left breast as well.  The left breast appears very firm with some erythema surrounding the nipple.  There is also some firmness and questionable masses under the left axilla region as well.  The left breast and left axillary region is slightly tender.  There is no warmth to any of the sites.  Patient continues with full range of motion.  Patient was given a prescription for hydrocodone to  take as directed for her breast/chest wall pain.  Reviewed all of these findings with Dr. Jana Hakim; he suggested the patient undergo a CT with contrast of the chest today for further evaluation of possible reoccurrence.  The plan is for the patient undergo the CT this afternoon; and to follow back up with Dr. Jana Hakim first thing in the morning to review the scan results.  Hypertension Patient has a history of hypertension.  Patient takes lisinopril and metoprolol as directed.  She confirmed that she did take her blood pressure medicines earlier this morning.  Blood pressure on arrival at the [facility] today was 210/82.  Patient states that her blood pressures always elevated when she is at the cancer center; but states that her blood pressure returns to her baseline when she returns back home.  Patient denies any symptoms of the hypertension at this time.  Advised patient to recheck her blood pressure when she arrives back on; and to call/return or go directly to the emergency department if her blood pressure remains elevated.  Hypokalemia Potassium was 3.2 with labs drawn today.  Patient was recently given a prescription for potassium to take as directed.  Will continue to monitor.  Breast cancer of upper-outer quadrant of right female breast Patient is status post right breast mastectomy in the past.  She is currently undergoing observation only.  Patient plans to return to the [facility] tomorrow to review her scan results  with Dr. Jana Hakim.  She is currently scheduled for labs only on 01/15/2016; and for follow-up visit with Dr. Jana Hakim on 01/22/2016.  Since Dr. Jana Hakim will see the patient tomorrow; will need to confirm when the next appropriate labs and follow-up visit will need to be scheduled.  Advised patient would confirm; and then notify patient of new appointments.   Patient stated understanding of all instructions; and was in agreement with this plan of care. The  patient knows to call the clinic with any problems, questions or concerns.   Total time spent with patient was 40 minutes;  with greater than 75 percent of that time spent in face to face counseling regarding patient's symptoms,  and coordination of care and follow up.  Disclaimer:This dictation was prepared with Dragon/digital dictation along with [employer]. Any transcriptional errors that result from this process are unintentional.  Drue Second, NP 12/17/2015

## 2015-12-17 NOTE — Assessment & Plan Note (Addendum)
Patient states that she developed some left chest wall/axilla/arm edema.  Approximate 2 weeks ago; and developed pain to the left chest wall and left arm approximate one week ago.  She denies any known injury or trauma to the site.  Exam today reveals edema to the entire left arm all the way down to the hand.  She also has what appears to be edema to the left chest wall surrounding the entire left breast as well.  The left breast appears very firm with some erythema surrounding the nipple.  There is also some firmness and questionable masses under the left axilla region as well.  The left breast and left axillary region is slightly tender.  There is no warmth to any of the sites.  Patient continues with full range of motion.  Patient was given a prescription for hydrocodone to take as directed for her breast/chest wall pain.  Reviewed all of these findings with Dr. Jana Hakim; he suggested the patient undergo a CT with contrast of the chest today for further evaluation of possible reoccurrence.  The plan is for the patient undergo the CT this afternoon; and to follow back up with Dr. Jana Hakim first thing in the morning to review the scan results.

## 2015-12-17 NOTE — Assessment & Plan Note (Signed)
Patient has a history of hypertension.  Patient takes lisinopril and metoprolol as directed.  She confirmed that she did take her blood pressure medicines earlier this morning.  Blood pressure on arrival at the [facility] today was 210/82.  Patient states that her blood pressures always elevated when she is at the cancer center; but states that her blood pressure returns to her baseline when she returns back home.  Patient denies any symptoms of the hypertension at this time.  Advised patient to recheck her blood pressure when she arrives back on; and to call/return or go directly to the emergency department if her blood pressure remains elevated.

## 2015-12-19 ENCOUNTER — Other Ambulatory Visit: Payer: Medicare Other

## 2015-12-24 ENCOUNTER — Other Ambulatory Visit: Payer: Self-pay | Admitting: *Deleted

## 2015-12-24 DIAGNOSIS — N6320 Unspecified lump in the left breast, unspecified quadrant: Secondary | ICD-10-CM

## 2015-12-24 DIAGNOSIS — C50411 Malignant neoplasm of upper-outer quadrant of right female breast: Secondary | ICD-10-CM

## 2015-12-24 DIAGNOSIS — I890 Lymphedema, not elsewhere classified: Secondary | ICD-10-CM

## 2015-12-24 MED ORDER — HYDROCODONE-ACETAMINOPHEN 5-325 MG PO TABS: 1.0000 | ORAL_TABLET | Freq: Four times a day (QID) | ORAL | Status: DC | PRN

## 2015-12-25 ENCOUNTER — Other Ambulatory Visit: Payer: Self-pay | Admitting: Oncology

## 2015-12-25 DIAGNOSIS — C50411 Malignant neoplasm of upper-outer quadrant of right female breast: Secondary | ICD-10-CM

## 2015-12-25 DIAGNOSIS — I890 Lymphedema, not elsewhere classified: Secondary | ICD-10-CM

## 2015-12-25 DIAGNOSIS — N6320 Unspecified lump in the left breast, unspecified quadrant: Secondary | ICD-10-CM

## 2015-12-26 ENCOUNTER — Ambulatory Visit
Admission: RE | Admit: 2015-12-26 | Discharge: 2015-12-26 | Disposition: A | Discharge status: Home / Self Care | Payer: Medicare Other | Source: Ambulatory Visit | Attending: Oncology | Admitting: Oncology

## 2015-12-26 DIAGNOSIS — Z853 Personal history of malignant neoplasm of breast: Secondary | ICD-10-CM | POA: Diagnosis not present

## 2015-12-26 DIAGNOSIS — I890 Lymphedema, not elsewhere classified: Secondary | ICD-10-CM

## 2015-12-26 DIAGNOSIS — N6320 Unspecified lump in the left breast, unspecified quadrant: Secondary | ICD-10-CM

## 2015-12-26 DIAGNOSIS — C773 Secondary and unspecified malignant neoplasm of axilla and upper limb lymph nodes: Secondary | ICD-10-CM | POA: Diagnosis not present

## 2015-12-26 DIAGNOSIS — C50411 Malignant neoplasm of upper-outer quadrant of right female breast: Secondary | ICD-10-CM

## 2015-12-26 DIAGNOSIS — R590 Localized enlarged lymph nodes: Secondary | ICD-10-CM | POA: Diagnosis not present

## 2015-12-27 ENCOUNTER — Other Ambulatory Visit: Payer: Self-pay | Admitting: Oncology

## 2015-12-27 ENCOUNTER — Ambulatory Visit (HOSPITAL_BASED_OUTPATIENT_CLINIC_OR_DEPARTMENT_OTHER): Payer: Medicare Other | Admitting: Oncology

## 2015-12-27 VITALS — BP 187/107 | HR 69 | Temp 98.6°F | Resp 18 | Ht 63.0 in | Wt 145.3 lb

## 2015-12-27 DIAGNOSIS — I890 Lymphedema, not elsewhere classified: Secondary | ICD-10-CM

## 2015-12-27 DIAGNOSIS — C50411 Malignant neoplasm of upper-outer quadrant of right female breast: Secondary | ICD-10-CM

## 2015-12-27 DIAGNOSIS — C773 Secondary and unspecified malignant neoplasm of axilla and upper limb lymph nodes: Secondary | ICD-10-CM

## 2015-12-27 DIAGNOSIS — I700 Atherosclerosis of aorta: Secondary | ICD-10-CM

## 2015-12-27 NOTE — Progress Notes (Signed)
Patient ID: Janice Powell, female   DOB: 28-Sep-1945, 70 y.o.   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 More recently her husband twisted her arm to get the mass looked at, and she brought it to Dr. Pennie Powell attention. Diagnostic mammography and right ultrasonography at [facility] 03/02/2012 showed a 6.78 cm irregular mass in the inner aspect of the right breast. There was nipple retraction and erythema around the nipple. The largest lymph node was noted in the right axilla. By ultrasound the large irregular hypoechoic mass was noted in the breast with multiple enlarged axillary lymph nodes. Physical exam confirmed a hard breast with erythema around the right nipple extending inferiorly. The nipple is slightly inverted.  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   Her subsequent history is as detailed below  INTERVAL HISTORY: Janice Powell returns today for follow-up of her left-sided breast cancer accompanied by her husband Janice Powell. Since her last visit here she allowed [place] to go ahead and biopsy the left axillary adenopathy and this showed ([id number]) metastatic carcinoma consistent with primary breast cancer. The prognostic panel is pending.  REVIEW OF SYSTEMS: Janice Powell did well with the biopsy, with no complications. She continues to have significant left upper extremity lymphedema, so far without erythema. She does have pain in the left shoulder, left  axilla, and left breast area. She is taking Norco 4 times a day for that with good control. She is taking MiraLAX as her bowel prophylaxis and that is working well. A detailed review of systems today was otherwise stable  PAST MEDICAL HISTORY: Past Medical History  Diagnosis Date  . Hypertension   . Pneumonia     hx  . GERD (gastroesophageal reflux disease)     occ  . Osteopenia   . Breast cancer ([place]) 03/08/13    right breast - Invasive Ductal Carcinoma   . S/P radiation therapy     1) Right Chest Wall and IM nodes / 50 Gy in 25 fractions /2) Right Supraclavicular fossa / 50 Gy in 25 fractions/1) Right Chest Wall and IM nodes / 50 Gy in 25 fractions /2) Right Supraclavicular fossa / 50 Gy in 25 fractions/3) Right Posterior Axillary boost / 6.55 Gy in 25 fractions/4) Right Chest Wall Scar boost / 10 Gy in 5 fractions      . Hx antineoplastic chemotherapy     two cycles of neoadjuvant cyclophosphamide, docetaxel, doxorubicin ["TAC"] at standard doses with neulasta support; refused neulasta with cycles 2 and 4; tolerated treatment poorly (b) docetaxel was held for final 4 cycles; completed cycle 6 neoadjuvant chemotherapy 12/27/2012    GERD osteopenia  PAST SURGICAL HISTORY: Past Surgical History  Procedure Laterality Date  . Appendectomy  1992  . Tubal ligation  1979  . Cholecystectomy  2000  . Portacath placement  08/15/2012    Procedure: INSERTION PORT-A-CATH;  Surgeon: [name], MD;  Location: [place];  Service: General;  Laterality: Left;  .  Mastectomy Right 03/13/2013  . Breast biopsy Right   . Port-a-cath removal  03/13/2013  . Mastectomy modified radical Right 03/13/2013    Procedure: MASTECTOMY MODIFIED RADICAL;  Surgeon: [name], MD;  Location: [place];  Service: General;  Laterality: Right;  . Port-a-cath removal Left 03/13/2013    Procedure: REMOVAL PORT-A-CATH;  Surgeon: [name], MD;  Location: [place];  Service: General;  Laterality: Left;    FAMILY  HISTORY Family History  Problem Relation Age of Onset  . Heart disease Mother    the patient's father died in his sleep at age 79. The patient's mother died at the age of 90 from a myocardial infarction. The patient has one brother and 2 sisters, all surviving. There is no history of breast or ovarian cancer in the family.  GYNECOLOGIC HISTORY:  (Reviewed 12/07/2013) Menarche age 67, first live birth age 85, she is GX P5, menopause age 33. She did not take hormone replacement.  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 Her husband Janice Powell worked in the post office 27 years, and is also now retired. He is a former Company secretary. Son Janice Powell lives in [place] and manages an [employer] store. Daughter Janice Powell also lives in [place] and manages the IT Department at [employer]. Son Janice Powell is a Engineer, materials. Daughter Janice Powell is a Actuary for [employer]. Son Janice Powell is an Actuary. The patient has 8 grandchildren. She attends [employer]   ADVANCED DIRECTIVES: Not in place  HEALTH MAINTENANCE:  (Updated 12/07/2013) Social History  Substance Use Topics  . Smoking status: Never Smoker   . Smokeless tobacco: Never Used  . Alcohol Use: No     Colonoscopy: Not on file  PAP: Not on file  Bone density:  January 2011   Lipid panel:  Not on file/ Dr. Shelia Media  No Known Allergies  Current Outpatient Prescriptions  Medication Sig Dispense Refill  . HYDROcodone-acetaminophen (NORCO/VICODIN) 5-325 MG tablet Take 1-2 tablets by mouth every 6 (six) hours as needed for moderate pain. 60 tablet 0  . lisinopril (PRINIVIL,ZESTRIL) 10 MG tablet TAKE 1 TABLET EVERY DAY 90 tablet 1  . metoprolol succinate (TOPROL-XL) 50 MG 24 hr tablet Take 1 tablet (50 mg total) by mouth daily. 30 tablet 0  . metoprolol succinate (TOPROL-XL) 50 MG 24 hr tablet TAKE 1 TABLET BY MOUTH EVERY DAY WITH OR IMMEDIATELY FOLLOWING  A MEAL 90 tablet 0  . omega-3 acid ethyl esters (LOVAZA) 1 G capsule Take 1 capsule (1 g total) by mouth 2 (two) times daily.    . potassium chloride (K-DUR) 10 MEQ tablet Take 1 tablet (10 mEq total) by mouth daily. 60 tablet 5   No current facility-administered medications for this visit.    OBJECTIVE: Middle-aged African American woman  Filed Vitals:   12/27/15 1506  BP: 187/107  Pulse: 69  Temp: 98.6 F (37 C)  Resp: 18     Body mass index is 25.75 kg/(m^2).    ECOG FS: 1 Filed Weights   12/27/15 1506  Weight: 145 lb 4.8 oz (65.908 kg)   Sclerae unicteric, EOMs intact Oropharynx clear and moist No cervical or supraclavicular adenopathy Lungs no rales or rhonchi Heart regular rate and rhythm Abd soft, nontender, positive bowel sounds MSK no focal spinal tenderness, no upper extremity lymphedema Neuro: nonfocal, well oriented, appropriate affect Breasts: Left breast is imaged below  Photo of left breast 12/17/2015    LAB RESULTS: CBC    Component Value Date/Time   WBC 6.1 12/16/2015 1614   WBC 5.7 03/02/2013 1230   RBC 4.18 12/16/2015 1614   RBC 4.19 03/02/2013 1230   HGB 13.2 12/16/2015 1614   HGB 13.9 03/02/2013 1230   HCT 40.6 12/16/2015 1614   HCT 41.3 03/02/2013 1230   PLT 256 12/16/2015 1614   PLT 226 03/02/2013 1230   MCV 97.2 12/16/2015 1614   MCV 98.6 03/02/2013 1230   MCH 31.7 12/16/2015 1614   MCH 33.2 03/02/2013 1230   MCHC 32.6 12/16/2015 1614   MCHC 33.7 03/02/2013 1230   RDW 13.9 12/16/2015 1614   RDW 14.0 03/02/2013 1230   LYMPHSABS 1.9 12/16/2015 1614   LYMPHSABS 1.7 03/02/2013 1230   MONOABS 0.7 12/16/2015 1614   MONOABS 0.7 03/02/2013 1230   EOSABS 0.0 12/16/2015 1614   EOSABS 0.1 03/02/2013 1230   BASOSABS 0.1 12/16/2015 1614   BASOSABS 0.0 03/02/2013 1230    CMP     Component Value Date/Time   NA 143 12/16/2015 1614   NA 141 03/02/2013 1230   K 3.2* 12/16/2015 1614   K 3.8 03/02/2013 1230   CL 104 03/02/2013 1230   CL  107 01/11/2013 0853   CO2 29 12/16/2015 1614   CO2 24 03/02/2013 1230   GLUCOSE 97 12/16/2015 1614   GLUCOSE 92 03/02/2013 1230   GLUCOSE 123* 01/11/2013 0853   BUN 12.8 12/16/2015 1614   BUN 11 03/02/2013 1230   CREATININE 0.8 12/16/2015 1614   CREATININE 0.71 03/02/2013 1230   CALCIUM 9.5 12/16/2015 1614   CALCIUM 9.3 03/02/2013 1230   PROT 7.7 12/16/2015 1614   PROT 7.1 03/02/2013 1230   ALBUMIN 3.8 12/16/2015 1614   ALBUMIN 3.9 03/02/2013 1230   AST 17 12/16/2015 1614   AST 30 03/02/2013 1230   ALT 16 12/16/2015 1614   ALT 16 03/02/2013 1230   ALKPHOS 53 12/16/2015 1614   ALKPHOS 78 03/02/2013 1230   BILITOT 0.46 12/16/2015 1614   BILITOT 0.6 03/02/2013 1230   GFRNONAA 88* 03/02/2013 1230   GFRAA >90 03/02/2013 1230       STUDIES: Ct Chest W Contrast  12/16/2015  CLINICAL DATA:  Restaging of breast cancer, diagnosed in 2014. Right-sided mastectomy and lymph node removal. Left breast and arm swelling for 2-3 weeks. EXAM: CT CHEST WITH CONTRAST TECHNIQUE: Multidetector CT imaging of the chest was performed during intravenous contrast administration. CONTRAST:  32m ISOVUE-300 IOPAMIDOL (ISOVUE-300) INJECTION 61% COMPARISON:  06/19/2014.  Clinic note of 06/13/2015 FINDINGS: Mediastinum/Nodes: No supraclavicular adenopathy. Mild cardiomegaly. No pericardial effusion. No central pulmonary embolism, on this non-dedicated study. No middle mediastinal or hilar adenopathy. No internal mammary adenopathy. Lungs/Pleura: Trace left pleural thickening is similar. Minimal motion degradation throughout. Anterior right upper lobe radiation fibrosis. Upper abdomen: Cholecystectomy. Normal imaged portions of the liver, spleen, stomach, pancreas, right adrenal gland, and left kidney. Mild left adrenal thickening is similar. Upper pole right renal scarring. Musculoskeletal: Right mastectomy and axillary node dissection. Significant increase an left axillary adenopathy. Increased number and size of  left axillary nodes. Index node measures 2.3 x 2.5 cm on image 30/series 2. compare 1.9 x 2.7 cm on the prior. More cephalad left axillary node measures 2.4 cm on image 24/series 2 versus 1.6 cm on the prior. Left breast soft tissue fullness and skin thickening are new. Left subpectoral node measures 8 mm on image 13/series 2 versus 7 mm on  the prior. IMPRESSION: 1. Progressive left axillary and subpectoral adenopathy, as evidenced by increased size and number of nodes. New soft tissue fullness throughout the left breast with skin thickening. Constellation of findings are suspicious for progressive metastatic disease and possible inflammatory type left sided breast cancer. Alternatively, left breast edema and skin thickening could be related to venous compression from axillary adenopathy. Mammographic correlation suggested. 2. Status post right mastectomy and axillary node dissection. Electronically Signed   By: Abigail Miyamoto M.D.   On: 12/16/2015 17:34   [place] Extrem Up Left Ltd  12/26/2015  CLINICAL DATA:  70 year old female with history of remote right breast cancer status post right mastectomy. The patient had a a CT demonstrating multiple abnormal left axillary lymph nodes. The patient has a red inflamed tender left breast and significant left upper extremity swelling. EXAM: ULTRASOUND LEFT UPPER EXTREMITY LIMITED TECHNIQUE: Ultrasound examination of the upper extremity soft tissues was performed in the area of clinical concern. COMPARISON:  Correlation made with CT from 12/16/2015. FINDINGS: There are multiple markedly enlarged masses in the left axilla. One of these masses measures 4.1 cm in long axis with a cortex of 1.5 cm. IMPRESSION: Multiple markedly abnormal left axillary lymph nodes. The patient has refused mammogram due to pain. As discussed previously with Dr. Jana Hakim, we will proceed with a left axillary biopsy today for tissue diagnosis. Electronically Signed   By: Ammie Ferrier M.D.   On:  12/26/2015 16:27   [place] Lt Breast Bx W Loc Dev 1st Lesion Img Bx Spec US Guide  12/27/2015  ADDENDUM REPORT: 12/27/2015 11:21 ADDENDUM: Pathology revealed METASTATIC CARCINOMA of the Left axillary lymph node, the findings are consistent with metastatic breast carcinoma. This was found to be concordant by Dr. Ammie Ferrier. Pathology results were discussed with the patient by telephone. The patient reported doing well after the biopsy with tenderness at the site. Post biopsy instructions and care were reviewed and questions were answered. The patient was encouraged to call [facility] for any additional concerns. Surgical consultation has been arranged with Dr. Fanny Powell at [facility] on January 09, 2016. Pathology results reported by Terie Purser, RN on 12/27/2015. Electronically Signed   By: Ammie Ferrier M.D.   On: 12/27/2015 11:21  12/27/2015  CLINICAL DATA:  70 year old female presenting for ultrasound-guided biopsy of 1 of the multiple abnormal left axillary lymph nodes previously identified on CT. EXAM: ULTRASOUND GUIDED LEFT BREAST CORE NEEDLE BIOPSY COMPARISON:  Previous exam(s). FINDINGS: I met with the patient and we discussed the procedure of ultrasound-guided biopsy, including benefits and alternatives. We discussed the high likelihood of a successful procedure. We discussed the risks of the procedure, including infection, bleeding, tissue injury, clip migration, and inadequate sampling. Informed written consent was given. The usual time-out protocol was performed immediately prior to the procedure. Using sterile technique and 1% Lidocaine as local anesthetic, under direct ultrasound visualization, a 14 gauge spring-loaded device was used to perform biopsy of a left axillary mass using an inferior approach. At the conclusion of the procedure a HydroMARK tissue marker clip was deployed into the biopsy cavity. IMPRESSION: Ultrasound guided biopsy of abnormal  left axillary lymph node. No apparent complications. Electronically Signed: By: Ammie Ferrier M.D. On: 12/26/2015 16:17    ASSESSMENT: 70 y.o. [place] woman   (1)  s/p Right breast upper inner quadrant and axillary node biopsy 03/08/2012 for a clinical T4, N1-2', stage IIIB invasive ductal carcinoma, grade 3, triple negative, with  an MIB-1 of 93%.  (2) definitive staging and treatment delayed as patient canceled staging studies and tried alternative treatments; with eventual progression  (3) neoadjuvant chemotherapy started 08/23/12  (a) received two cycles of neoadjuvant cyclophosphamide, docetaxel, doxorubicin ["TAC"] at standard doses with neulasta support; refused neulasta with cycles 2 and 4; tolerated treatment poorly  (b) docetaxel was held for final 4 cycles; completed cycle 6 neoadjuvant chemotherapy 12/27/2012    (4) status post right modified radical mastectomy 03/13/2013 showing no residual carcinoma in the breast, but nine of 9 axillary lymph nodes sampled involved with macro metastases (ypTX, ypN2, stage IIIA)-- repeat prognostic panel again triple negative  (5) radiation therapy completed 05/31/2013  (6) patient opted against reconstruction  (7) left axillary adenopathy noted on mammography June 2015, confirmed on CT scan November 2015  (a) patient refused further workup or treatment on multiple visits (see notes)  (8) chest CT 12/16/2015 shows progressive left axillary and subpectoral adenopathy, left upper extremity lymphedema left breast soft tissue fullness and skin thickening  (a) left axillary lymph node biopsy 12/26/2015 confirms metastatic carcinoma, prognostic panel pending  PLAN: I spent approximately 50 minutes with the patient and her husband going over her situation in detail. Recall that she likes things to be stated positively. Accordingly I said that I would hope that we will see HER-2 positivity and or estrogen receptor positivity in the prognostic  panel from her left axillary lymph node biopsy. If the cancer is triple negative then the only available form of systemic therapy is chemotherapy.  [place]  is willing to consider chemotherapy at this point. She does not want the same chemotherapy she had before, which she said was "horrible" and almost killed her. We can consider carboplatin and gemcitabine given days 1 and 8 of each 21 day cycle and I proposed a minimum of 4 cycles more likely a total of 8, the goal being to make her eventual left  modified radical mastectomy as effective as possible. The plan will then be to follow surgery with radiation.   We discussed the possible side effects, toxicities and complications of these agents which unlike her earlier chemotherapy should not put her at risk of getting down multiple neuropathy, or heart damage.  She wanted to know how her breast cancer went from the right breast to the left and a would be very useful to have a PET scan to help answer that question. However she refuses any further tests at this point.   We discussed access issues. She of course had a right axillary lymphadenectomy in the past so we would like to spare that arm. The left arm has significant lymphedema. Accordingly she will need either a port or opaque. After much discussion she decided she would go with the port. She already has an appointment with Dr. Dalbert Batman on 12/30/2015 to discuss it further and to start planning for her eventual surgery if possible.  We should have results of her prognostic panel by next Wednesday 01/01/2016, and she will call for those results.  I asked her to think positively about her chemotherapy agents which are meant to help her. She stated that "you make more money when you give chemotherapy". That is actually the case. It is an unfortunate conflict of interest due to are current contract. However the reason she needs chemotherapy is because she has no other systemic therapy option at this  point  In short the overall plan is for chemotherapy to shrink the tumor as much as  possible followed by definitive surgery followed by consolidative radiation which likely will be given with capecitabine. She agrees with this plan. She knows to call for any problems that may develop before her next visit here.  Target start date for the chemotherapy is 01/13/2016. She is going to see me a few days before to discuss anti-emetics. I also scheduled her for a "chemotherapy school" appointment next week so she can learn more about side effects      Sylvestre Rathgeber C, MD    12/27/2015

## 2015-12-30 ENCOUNTER — Telehealth: Payer: Self-pay | Admitting: Oncology

## 2015-12-30 ENCOUNTER — Other Ambulatory Visit: Payer: Self-pay | Admitting: General Surgery

## 2015-12-30 DIAGNOSIS — C773 Secondary and unspecified malignant neoplasm of axilla and upper limb lymph nodes: Secondary | ICD-10-CM | POA: Diagnosis not present

## 2015-12-30 DIAGNOSIS — Z853 Personal history of malignant neoplasm of breast: Secondary | ICD-10-CM | POA: Diagnosis not present

## 2015-12-30 DIAGNOSIS — I10 Essential (primary) hypertension: Secondary | ICD-10-CM | POA: Diagnosis not present

## 2015-12-30 DIAGNOSIS — Z9011 Acquired absence of right breast and nipple: Secondary | ICD-10-CM | POA: Diagnosis not present

## 2015-12-30 DIAGNOSIS — C50112 Malignant neoplasm of central portion of left female breast: Secondary | ICD-10-CM | POA: Diagnosis not present

## 2015-12-30 DIAGNOSIS — C50912 Malignant neoplasm of unspecified site of left female breast: Secondary | ICD-10-CM | POA: Diagnosis not present

## 2015-12-30 NOTE — Telephone Encounter (Signed)
s.w. pt and advised on June appts....pt ok and aware...pt aware she will get new sched in chemo edu

## 2015-12-31 ENCOUNTER — Ambulatory Visit: Payer: Medicare Other | Attending: Oncology | Admitting: Physical Therapy

## 2015-12-31 DIAGNOSIS — M25612 Stiffness of left shoulder, not elsewhere classified: Secondary | ICD-10-CM

## 2015-12-31 DIAGNOSIS — I890 Lymphedema, not elsewhere classified: Secondary | ICD-10-CM | POA: Diagnosis not present

## 2015-12-31 NOTE — Therapy (Signed)
[place], [place], [address] Phone: [phone]   Fax:  [phone]  Physical Therapy Evaluation  Patient Details  Name: Janice Powell MRN: [id number] Date of Birth: 1946/02/12 Referring Provider: Dr. Lurline Del  Encounter Date: 12/31/2015      PT End of Session - 12/31/15 1751    Visit Number 1   Number of Visits 4   Date for PT Re-Evaluation 01/31/16   PT Start Time [id number]   PT Stop Time 1430   PT Time Calculation (min) 43 min   Activity Tolerance Patient tolerated treatment well   Behavior During Therapy [facility] for tasks assessed/performed      Past Medical History  Diagnosis Date  . Hypertension   . Pneumonia     hx  . GERD (gastroesophageal reflux disease)     occ  . Osteopenia   . Breast cancer ([place]) 03/08/13    right breast - Invasive Ductal Carcinoma   . S/P radiation therapy     1) Right Chest Wall and IM nodes / 50 Gy in 25 fractions /2) Right Supraclavicular fossa / 50 Gy in 25 fractions/1) Right Chest Wall and IM nodes / 50 Gy in 25 fractions /2) Right Supraclavicular fossa / 50 Gy in 25 fractions/3) Right Posterior Axillary boost / 6.55 Gy in 25 fractions/4) Right Chest Wall Scar boost / 10 Gy in 5 fractions      . Hx antineoplastic chemotherapy     two cycles of neoadjuvant cyclophosphamide, docetaxel, doxorubicin ["TAC"] at standard doses with neulasta support; refused neulasta with cycles 2 and 4; tolerated treatment poorly (b) docetaxel was held for final 4 cycles; completed cycle 6 neoadjuvant chemotherapy 12/27/2012      Past Surgical History  Procedure Laterality Date  . Appendectomy  1992  . Tubal ligation  1979  . Cholecystectomy  2000  . Portacath placement  08/15/2012    Procedure: INSERTION PORT-A-CATH;  Surgeon: [name], MD;  Location: [place];  Service: General;  Laterality: Left;  Marland Kitchen Mastectomy Right 03/13/2013  . Breast biopsy Right   . Port-a-cath removal   03/13/2013  . Mastectomy modified radical Right 03/13/2013    Procedure: MASTECTOMY MODIFIED RADICAL;  Surgeon: [name], MD;  Location: [place];  Service: General;  Laterality: Right;  . Port-a-cath removal Left 03/13/2013    Procedure: REMOVAL PORT-A-CATH;  Surgeon: [name], MD;  Location: [place];  Service: General;  Laterality: Left;    There were no vitals filed for this visit.       Subjective Assessment - 12/31/15 1350    Subjective Dr. Jana Hakim told me to come over here for some kind of a sleeve.   Pertinent History Ms. Voller noted a mass in her right breast sometime in 2011. 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 More recently her husband twisted her arm to get the mass looked at, and she brought it to Dr. Pennie Banter attention. Diagnostic mammography and right ultrasonography at [facility] 03/02/2012 showed a 6.78 cm irregular mass in the inner aspect of the right breast. There was nipple retraction and erythema around the nipple. The largest lymph node was noted in the right axilla. By ultrasound the large irregular hypoechoic mass was noted in the breast with multiple enlarged axillary lymph nodes. Physical exam confirmed a hard breast with erythema around the right nipple extending inferiorly. The nipple is slightly inverted.  Biopsy of  this mass was obtained 03/08/2012 and showed a high-grade triple negative breast cancer with a very elevated MIB-1.   She underwent chemotherapy treatment for that.  2015 left adenopathy was noted; 2017 further left axillary and subpectoral adenopathy noted on scans and confirmed with biopsy.   Pt. expects to have a Portacath put in and then chemotherapy with mastectomy later.  HTN controlled with meds.     Patient Stated Goals for the swelling to go down   Currently in Pain? Yes   Pain Score 3    Pain Location Breast   Pain Orientation Left   Pain Descriptors / Indicators Tender   Pain Frequency  Intermittent   Aggravating Factors  breast biopsy   Pain Relieving Factors pain meds            [facility] PT Assessment - 12/31/15 0001    Assessment   Medical Diagnosis h/o right breast cancer with mastectomy and chemo, and newer left breast cancer with adenopathy   Referring Provider Dr. Lurline Del   Precautions   Precautions Other (comment)   Precaution Comments cancer precautions   Restrictions   Weight Bearing Restrictions No   Balance Screen   Has the patient fallen in the past 6 months No   Has the patient had a decrease in activity level because of a fear of falling?  No   Is the patient reluctant to leave their home because of a fear of falling?  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, maybe 2 days a week   Cognition   Overall Cognitive Status Within Functional Limits for tasks assessed   Observation/Other Assessments   Other Surveys  --  lymph life impact scale 3; 5% impaired (1 question blank)   ROM / Strength   AROM / PROM / Strength AROM;Strength   AROM   AROM Assessment Site Shoulder   Right/Left Shoulder Right;Left   Right Shoulder Flexion 120 Degrees  right chest/mastectomy feels tight   Right Shoulder ABduction 110 Degrees   Left Shoulder Flexion 115 Degrees   Left Shoulder ABduction 95 Degrees  tends toward scaption   Strength   Overall Strength Comments left shoulder strength grossly 3-/5   Ambulation/Gait   Ambulation/Gait Yes   Ambulation/Gait Assistance 7: Independent           LYMPHEDEMA/ONCOLOGY QUESTIONNAIRE - 12/31/15 1411    Type   Cancer Type h/o right breast cancer and currently with left breast cancer   Surgeries   Number Lymph Nodes Removed 20  on right; none yet on left    Treatment   Past Chemotherapy Treatment Yes   What other symptoms do you have   Are you having pitting edema Yes   [place] Sign No   Lymphedema Assessments   Lymphedema Assessments Upper extremities   Right Upper Extremity Lymphedema   10 cm Proximal to Olecranon Process 31.2 cm   Olecranon Process 25.4 cm   10 cm Proximal to Ulnar Styloid Process 21.6 cm   Just Proximal to Ulnar Styloid Process 16.4 cm   Across Hand at [employer] 17.1 cm   At [place] of 2nd Digit 5.5 cm   Left Upper Extremity Lymphedema   10 cm Proximal  to Olecranon Process 36.7 cm   Olecranon Process 31.4 cm   10 cm Proximal to Ulnar Styloid Process 26.7 cm   Just Proximal to Ulnar Styloid Process 19.3 cm   Across Hand at [employer] 18.3 cm   At [place] of 2nd Digit 5.6 cm                                Long Term Clinic Goals - 12/31/15 1757    CC Long Term Goal  #1   Title Patient and/or her husband will be able to perform manual lymph drainage.   Time 3   Period Weeks   Status New   CC Long Term Goal  #2   Title Patient will be knowledgeable about where and how to obtain a compression garment if she chooses to get one.   Time 3   Period Weeks   Status New            Plan - 12/31/15 1752    Clinical Impression Statement Patient with h/o right and now a left breast cancer with lymphadenopathy at left axilla and subclavicular areas who has significant swelling of left UE.  She also has limited shoulder AROM bilaterally and limited strength in left UE.  Treatment options were discussed.  Pt. asked if her lymphedema could improve and was told that in her case, chemotherapy might help it.  She does not want to do bandaging, though she would told this would give her the best chance at reduction of swelling.  She chooses to learn manual lymph drainage; she is still deciding whether she would like to get a compression garment.   Rehab Potential Fair   PT Frequency --  2-3 visits  total   PT Duration 4 weeks   PT Treatment/Interventions ADLs/Self Care Home Management;DME Instruction;Therapeutic exercise;Patient/family education;Orthotic Fit/Training;Manual techniques;Manual lymph drainage;Compression bandaging;Passive range of motion   PT Next Visit Plan Begin instruction in manual lymph drainage of left UE, directing towards left groin.  Discuss compression sleeves further and assist patient with decision about that.     Recommended Other Services Patient plans to check with her insurance about whether garments may be covered; if so, she will probably want a referral to [facility] for these.   Consulted and Agree with Plan of Care Patient      Patient will benefit from skilled therapeutic intervention in order to improve the following deficits and impairments:  Increased edema, Decreased range of motion, Decreased strength, Impaired UE functional use  Visit Diagnosis: Lymphedema, not elsewhere classified - Plan: PT plan of care cert/re-cert  Stiffness of left shoulder, not elsewhere classified - Plan: PT plan of care cert/re-cert      G-Codes - [id number]    Functional Assessment Tool Used lymphedema life impact scale   Functional Limitation Self care   Self Care Current Status [id number]) At least 1 percent but less than 20 percent impaired, limited or restricted   Self Care Goal Status [id number]) At least 1 percent but less than 20 percent impaired, limited or restricted       Problem List Patient Active Problem List   Diagnosis Date Noted  . Lymphedema of arm 12/17/2015  . Breast mass, left 06/15/2015  . Atherosclerosis of aorta ([place]) 08/02/2014  . Breast cancer of upper-outer quadrant of right female breast ([place]) 03/31/2013  . Hypokalemia 12/13/2012  . Hypertension 07/04/2012    Judith Campillo 12/31/2015,  6:02 PM  [place], [place], [address] Phone: [phone]   Fax:   [phone]  Name: Janice Powell MRN: [id number] Date of Birth: [birth year]   Serafina Royals, PT 12/31/2015 6:02 PM

## 2016-01-02 ENCOUNTER — Other Ambulatory Visit: Payer: Medicare Other

## 2016-01-02 ENCOUNTER — Encounter: Payer: Self-pay | Admitting: Oncology

## 2016-01-02 NOTE — Progress Notes (Signed)
Called pt & introduced myself as her FA.  Informed her w/ her insurance coverage copay assistance may not be needed but asked if she needed any assistance w/ personal bills & transportation while going thru treatment from the [facility].  Pt declined.  I will meet her on 01/10/16 to give my contact info in case she changes her mind & for any questions or concerns she may have in the future.

## 2016-01-06 ENCOUNTER — Other Ambulatory Visit: Payer: Self-pay | Admitting: *Deleted

## 2016-01-06 DIAGNOSIS — I890 Lymphedema, not elsewhere classified: Secondary | ICD-10-CM

## 2016-01-06 DIAGNOSIS — C50411 Malignant neoplasm of upper-outer quadrant of right female breast: Secondary | ICD-10-CM

## 2016-01-06 DIAGNOSIS — N6320 Unspecified lump in the left breast, unspecified quadrant: Secondary | ICD-10-CM

## 2016-01-06 MED ORDER — HYDROCODONE-ACETAMINOPHEN 5-325 MG PO TABS: 1.0000 | ORAL_TABLET | Freq: Four times a day (QID) | ORAL | Status: DC | PRN

## 2016-01-07 ENCOUNTER — Ambulatory Visit: Payer: Medicare Other | Admitting: Physical Therapy

## 2016-01-07 ENCOUNTER — Ambulatory Visit (HOSPITAL_COMMUNITY)
Admission: RE | Admit: 2016-01-07 | Discharge: 2016-01-07 | Disposition: A | Discharge status: Home / Self Care | Payer: Medicare Other | Source: Ambulatory Visit | Attending: General Surgery | Admitting: General Surgery

## 2016-01-07 ENCOUNTER — Encounter (HOSPITAL_COMMUNITY): Payer: Self-pay

## 2016-01-07 ENCOUNTER — Encounter (HOSPITAL_COMMUNITY)
Admission: RE | Admit: 2016-01-07 | Discharge: 2016-01-07 | Disposition: A | Discharge status: Home / Self Care | Payer: Medicare Other | Source: Ambulatory Visit | Attending: General Surgery | Admitting: General Surgery

## 2016-01-07 DIAGNOSIS — Z01818 Encounter for other preprocedural examination: Secondary | ICD-10-CM | POA: Diagnosis not present

## 2016-01-07 DIAGNOSIS — I10 Essential (primary) hypertension: Secondary | ICD-10-CM | POA: Insufficient documentation

## 2016-01-07 DIAGNOSIS — M25612 Stiffness of left shoulder, not elsewhere classified: Secondary | ICD-10-CM

## 2016-01-07 DIAGNOSIS — I890 Lymphedema, not elsewhere classified: Secondary | ICD-10-CM | POA: Diagnosis not present

## 2016-01-07 HISTORY — DX: Presence of spectacles and contact lenses: Z97.3

## 2016-01-07 LAB — CBC WITH DIFFERENTIAL/PLATELET
Basophils Absolute: 0 10*3/uL (ref 0.0–0.1)
Basophils Relative: 0 %
EOS ABS: 0 10*3/uL (ref 0.0–0.7)
EOS PCT: 1 %
HCT: 40.4 % (ref 36.0–46.0)
Hemoglobin: 13.3 g/dL (ref 12.0–15.0)
LYMPHS ABS: 1.8 10*3/uL (ref 0.7–4.0)
Lymphocytes Relative: 30 %
MCH: 32.3 pg (ref 26.0–34.0)
MCHC: 32.9 g/dL (ref 30.0–36.0)
MCV: 98.1 fL (ref 78.0–100.0)
MONOS PCT: 10 %
Monocytes Absolute: 0.6 10*3/uL (ref 0.1–1.0)
Neutro Abs: 3.5 10*3/uL (ref 1.7–7.7)
Neutrophils Relative %: 59 %
PLATELETS: 300 10*3/uL (ref 150–400)
RBC: 4.12 MIL/uL (ref 3.87–5.11)
RDW: 14.4 % (ref 11.5–15.5)
WBC: 6 10*3/uL (ref 4.0–10.5)

## 2016-01-07 LAB — COMPREHENSIVE METABOLIC PANEL
ALK PHOS: 41 U/L (ref 38–126)
ALT: 17 U/L (ref 14–54)
ANION GAP: 7 (ref 5–15)
AST: 32 U/L (ref 15–41)
Albumin: 4.1 g/dL (ref 3.5–5.0)
BUN: 11 mg/dL (ref 6–20)
CALCIUM: 9 mg/dL (ref 8.9–10.3)
CHLORIDE: 102 mmol/L (ref 101–111)
CO2: 30 mmol/L (ref 22–32)
Creatinine, Ser: 0.8 mg/dL (ref 0.44–1.00)
GFR calc non Af Amer: >60 mL/min (ref >60)
Glucose, Bld: 99 mg/dL (ref 65–99)
POTASSIUM: 4.1 mmol/L (ref 3.5–5.1)
SODIUM: 139 mmol/L (ref 135–145)
Total Bilirubin: 1.4 mg/dL — ABNORMAL HIGH (ref 0.3–1.2)
Total Protein: 7.5 g/dL (ref 6.5–8.1)

## 2016-01-07 NOTE — Therapy (Signed)
[place], [place], [address] Phone: [phone]   Fax:  [phone]  Physical Therapy Treatment  Patient Details  Name: Janice Powell MRN: [id number] Date of Birth: 1946-01-15 Referring Provider: Dr. Lurline Del  Encounter Date: 01/07/2016      PT End of Session - 01/07/16 1522    Visit Number 2   Number of Visits 4   Date for PT Re-Evaluation 01/31/16   PT Start Time 1430   PT Stop Time 1515   PT Time Calculation (min) 45 min   Activity Tolerance Patient tolerated treatment well   Behavior During Therapy [facility] for tasks assessed/performed      Past Medical History  Diagnosis Date  . Hypertension   . Pneumonia     hx  . GERD (gastroesophageal reflux disease)     occ  . Osteopenia   . Breast cancer ([place]) 03/08/13    right breast - Invasive Ductal Carcinoma   . S/P radiation therapy     1) Right Chest Wall and IM nodes / 50 Gy in 25 fractions /2) Right Supraclavicular fossa / 50 Gy in 25 fractions/1) Right Chest Wall and IM nodes / 50 Gy in 25 fractions /2) Right Supraclavicular fossa / 50 Gy in 25 fractions/3) Right Posterior Axillary boost / 6.55 Gy in 25 fractions/4) Right Chest Wall Scar boost / 10 Gy in 5 fractions      . Hx antineoplastic chemotherapy     two cycles of neoadjuvant cyclophosphamide, docetaxel, doxorubicin ["TAC"] at standard doses with neulasta support; refused neulasta with cycles 2 and 4; tolerated treatment poorly (b) docetaxel was held for final 4 cycles; completed cycle 6 neoadjuvant chemotherapy 12/27/2012    . Wears glasses     Past Surgical History  Procedure Laterality Date  . Appendectomy  1992  . Tubal ligation  1979  . Cholecystectomy  2000  . Portacath placement  08/15/2012    Procedure: INSERTION PORT-A-CATH;  Surgeon: [name], MD;  Location: [place];  Service: General;  Laterality: Left;  Marland Kitchen Mastectomy Right 03/13/2013  . Breast biopsy Right   .  Port-a-cath removal  03/13/2013  . Mastectomy modified radical Right 03/13/2013    Procedure: MASTECTOMY MODIFIED RADICAL;  Surgeon: [name], MD;  Location: [place];  Service: General;  Laterality: Right;  . Port-a-cath removal Left 03/13/2013    Procedure: REMOVAL PORT-A-CATH;  Surgeon: [name], MD;  Location: [place];  Service: General;  Laterality: Left;    There were no vitals filed for this visit.      Subjective Assessment - 01/07/16 1439    Subjective Feeling OK   Pt has decided not to do the bandaging or get a compression sleeve.  She agreed to try temporary Tg soft compression with encouragement from her spouse. She wants to wait until after surgery before she tries kinesiotape   Pertinent History Ms. Chalfin noted a mass in her right breast sometime in 2011. 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 More recently her husband twisted her arm to get the mass looked at, and she brought it to Dr. Pennie Banter attention. Diagnostic mammography and right ultrasonography at [facility] 03/02/2012 showed a 6.78 cm irregular mass in the inner aspect of the right breast. There was nipple retraction and erythema around the nipple. The largest lymph node was noted in the right axilla. By ultrasound the large irregular hypoechoic mass was  noted in the breast with multiple enlarged axillary lymph nodes. Physical exam confirmed a hard breast with erythema around the right nipple extending inferiorly. The nipple is slightly inverted.  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   She underwent chemotherapy treatment for that.  2015 left adenopathy was noted; 2017 further left axillary and subpectoral adenopathy noted on scans and confirmed with biopsy.   Pt. expects to have a Portacath put in and then chemotherapy with mastectomy later.  HTN controlled with meds.     Patient Stated  Goals for the swelling to go down   Currently in Pain? No/denies                         [facility] Adult PT Treatment/Exercise - 01/07/16 0001    Manual Therapy   Manual Therapy Manual Lymphatic Drainage (MLD)   Manual Lymphatic Drainage (MLD) Instruced pt and spouse in manual lymph drainage in supine: short neck, diphragmatic breathing, left inuinal nodes, left axillo-inguinal anastamosis, left shoulder, upper arm and forearm, with elevation and remedical exercise ,they to sidelying for posterior shoulder and upper back , left lateral side toward back andinguinal nodes.  Husband with hands on pariticipateion with hand over hand cues at times, was able to return demonstrate technique                 PT Education - 01/07/16 1519    Education provided Yes   Education Details manual lymph drainage and exercise    Person(s) Educated Patient;Spouse   Methods Explanation;Demonstration;Handout   Comprehension Verbalized understanding;Returned demonstration                [facility] Goals - 12/31/15 1757    CC Long Term Goal  #1   Title Patient and/or her husband will be able to perform manual lymph drainage.   Time 3   Period Weeks   Status New   CC Long Term Goal  #2   Title Patient will be knowledgeable about where and how to obtain a compression garment if she chooses to get one.   Time 3   Period Weeks   Status New            Plan - 01/07/16 1528    Clinical Impression Statement Pt reports she is getting her port on Thursday and then will begin chemo prior to surgery.  She agreed to try the tg soft sleeve andshe and husband will monitor and remove sleeve at first sign of rolling or cutting into arm. Pt may benefit from kinesiotaping but she does not want to try even that until after surgery    Rehab Potential Fair   PT Duration 4 weeks   PT Next Visit Plan Assess use of tg soft.  Review and perform manual lymph drainage and remedial exercise.   send note to dr Jana Hakim if pt decides to try kinesiotape   Consulted and Agree with Plan of Care Patient;Family member/caregiver   Family Member Consulted spouse      Patient will benefit from skilled therapeutic intervention in order to improve the following deficits and impairments:  Increased edema, Decreased range of motion, Decreased strength, Impaired UE functional use  Visit Diagnosis: Lymphedema, not elsewhere classified  Stiffness of left shoulder, not elsewhere classified     Problem List Patient Active Problem List   Diagnosis Date Noted  . Lymphedema of  arm 12/17/2015  . Breast mass, left 06/15/2015  . Atherosclerosis of aorta ([place]) 08/02/2014  . Breast cancer of upper-outer quadrant of right female breast ([place]) 03/31/2013  . Hypokalemia 12/13/2012  . Hypertension 07/04/2012   Donato Heinz. Owens Shark, PT  01/07/2016, 3:33 PM  [place], [place], [address] Phone: [phone]   Fax:  [phone]  Name: Janice Powell MRN: [id number] Date of Birth: 10/15/1945

## 2016-01-07 NOTE — Patient Instructions (Addendum)
Deep Effective Breath   Standing, sitting, or laying down, place both hands on the belly. Take a deep breath IN, expanding the belly; then breath OUT, contracting the belly. Repeat __5__ times. Do __2-3__ sessions per day and before your self massage.  http://gt2.exer.us/866   Copyright  VHI. All rights reserved.  Axilla to Inguinal Nodes - Sweep   On involved side, make 5 circles at groin at panty line, then pump _5__ times from armpit along side of trunk to outer hip, making your other pathway. Do __1_ time per day.  Copyright  VHI. All rights reserved.  Arm Posterior: Elbow to Shoulder - Sweep   Pump _5__ times from back of elbow to top of shoulder. Then inner to outer upper arm _5_ times, then outer arm again _5_ times. Then back to the pathways _2-3_ times. Do _1__ time per day.  Copyright  VHI. All rights reserved.  ARM: [place] Wrist to Elbow - Sweep   Pump or stationary circles _5__ times from wrist to elbow making sure to do both sides of the forearm. Then retrace your steps to the outer arm, and the pathways _2-3_ times each. Do _1__ time per day.  Copyright  VHI. All rights reserved.  ARM: Dorsum of Hand to Shoulder - Sweep   Pump or stationary circles _5__ times on back of hand including knuckle spaces and individual fingers if needed working up towards the wrist, then retrace all your steps working back up the forearm, doing both sides; upper outer arm and back to your pathways _2-3_ times each. Then do 5 circles again at uninvolved armpit and involved groin where you started! Good job!! Do __1_ time per day.  Copyright  VHI. All rights reserved.     Neck Side-Bending   Begin with chin level, head centered over spine. Slowly lower one ear toward shoulder keeping shoulder down. Hold __3__ seconds. Slowly return to starting position. Repeat to other side.  Repeat __5-10__ times to each side. Do 2-3 times a day. Also stretch by looking over right shoulder,  then left.  Scapular Retraction (Standing)   With arms at sides, pinch shoulder blades together.  Hold 3 seconds. Repeat __5-10__ times per set.  Do __2-3__ sessions per day.   Active ROM Flexion    Raise arm to point to ceiling, keeping elbow straight. Hold __3__ seconds. Repeat _5-10___ times. Do _2-3___ sessions per day. Activity: Use this motion to reach for items on overhead shelves.     Flexion (Active)   Palm up, rest elbow on other hand. Bend as far as possible. Hold __3__ seconds. Repeat __5-10__ times. Do _2-3___ sessions per day. Can use other hand to bend elbow further if needed where bandage may limit end motion.  Copyright  VHI. All rights reserved.    Extension (Active With Finger Extension)   Llift hand with fingers straight, then bend wrist down.  Hold _3___ seconds each way. Repeat __5-10__ times. Do _2-3___ sessions per day.    AROM: Forearm Pronation / Supination   With right arm in handshake position, slowly rotate palm down. Relax. Then rotate palm up. Repeat __5-10__ times per set. Do __2-3__ sessions per day.  Copyright  VHI. All rights reserved.    AROM: Finger Flexion / Extension   Actively bend fingers of right hand. Start with knuckles furthest from palm, and slowly make a fist. Hold __3__ seconds. Relax. Then straighten fingers as far as possible. Repeat _5-10___ times per set.  Do __2-3__ sessions  per day. Also spread fingers as far as able, then bring them back together.  Copyright  VHI. All rights reserved.   If any pain/tingling symptoms occur, try all of these exercises first to promote circulation. If symptoms do not ease off, then remove bandages and bring all wrappings back to next appointment.

## 2016-01-07 NOTE — Patient Instructions (Signed)
Janice Powell  01/07/2016   Your procedure is scheduled on: Thursday January 09, 2016  Report to Janice Powell Main  Entrance take Janice Powell  elevators to 3rd floor to  [place] at 5:30 AM.  Call this number if you have problems the morning of surgery [phone]   Remember: ONLY 1 PERSON MAY GO WITH YOU TO SHORT STAY TO GET  READY MORNING OF Janice Powell.  Do not eat food or drink liquids :After Midnight.     Take these medicines the morning of surgery with A SIP OF WATER: Metoprolol                                You may not have any metal on your body including hair pins and              piercings  Do not wear jewelry, make-up, lotions, powders or perfumes, deodorant             Do not wear nail polish.  Do not shave  48 hours prior to surgery.              Do not bring valuables to the hospital. Janice Powell.  Contacts, dentures or bridgework may not be worn into surgery.      Patients discharged the day of surgery will not be allowed to drive home.  Name and phone number of your driver:Janice Powell (husband)  _____________________________________________________________________             Janice Powell - Preparing for Surgery Before surgery, you can play an important role.  Because skin is not sterile, your skin needs to be as free of germs as possible.  You can reduce the number of germs on your skin by washing with CHG (chlorahexidine gluconate) soap before surgery.  CHG is an antiseptic cleaner which kills germs and bonds with the skin to continue killing germs even after washing. Please DO NOT use if you have an allergy to CHG or antibacterial soaps.  If your skin becomes reddened/irritated stop using the CHG and inform your nurse when you arrive at Short Stay. Do not shave (including legs and underarms) for at least 48 hours prior to the first CHG shower.  You may shave your face/neck. Please follow these  instructions carefully:  1.  Shower with CHG Soap the night before surgery and the  morning of Surgery.  2.  If you choose to wash your hair, wash your hair first as usual with your  normal  shampoo.  3.  After you shampoo, rinse your hair and body thoroughly to remove the  shampoo.                           4.  Use CHG as you would any other liquid soap.  You can apply chg directly  to the skin and wash                       Gently with a scrungie or clean washcloth.  5.  Apply the CHG Soap to your body ONLY FROM THE NECK DOWN.   Do not use on face/ open  Wound or open sores. Avoid contact with eyes, ears mouth and genitals (private parts).                       Wash face,  Genitals (private parts) with your normal soap.             6.  Wash thoroughly, paying special attention to the area where your surgery  will be performed.  7.  Thoroughly rinse your body with warm water from the neck down.  8.  DO NOT shower/wash with your normal soap after using and rinsing off  the CHG Soap.                9.  Pat yourself dry with a clean towel.            10.  Wear clean pajamas.            11.  Place clean sheets on your bed the night of your first shower and do not  sleep with pets. Day of Surgery : Do not apply any lotions/deodorants the morning of surgery.  Please wear clean clothes to the hospital/surgery Powell.  FAILURE TO FOLLOW THESE INSTRUCTIONS MAY RESULT IN THE CANCELLATION OF YOUR SURGERY PATIENT SIGNATURE_________________________________  NURSE SIGNATURE__________________________________  ________________________________________________________________________

## 2016-01-08 NOTE — H&P (Signed)
Janice Powell  Location: [facility] Patient #: [id number] DOB: 11-21-1945 Married / Language: English / Race: Black or African American Female       History of Present Illness     This is a 70 year old African-American female, former patient of mine. She is referred to me by Dr. Jana Hakim for Port-A-Cath  insertion to assist in management of neoadjuvant chemotherapy because of the new locally advanced cancer of the left breast.       Past history is significant in that she noticed a mass in the right breast in 2011 but she did not report this until 2013. Imaging studies on March 02, 2012 showed a 6.7 cm irregular mass in the inner aspect of the right breast with nipple retraction and skin erythema around the nipple. A large lymph node was noted in the right axilla. Physical exam confirmed a hard breast with erythema around the right nipple extending inferiorly and multiple enlarged lymph nodes. 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 She initially declined chemotherapy but eventually reconsidered. Port-A-Cath was placed on August 15, 2012 and she received neoadjuvant chemotherapy. MRI performed on January 19, 2013 showed good response in the right breast with minimal enhancement. The lymph nodes were smaller. On March 13, 2013 she underwent right modified radical mastectomy and removal of Port-A-Cath. final pathology showed complete pathologic response in the breast, however 9 out of 9 lymph nodes were positive for metastatic disease. She completed radiation therapy and physical therapy to the right shoulder. She's had chronic swelling of the right arm but not excessive and she does not use asleep. She's had no recurrence on the right side.      Considering that past history, she now presents with a locally advanced cancer of the left breast. Left axillary adenopathy was noted on mammogram in June 2015 and confirmed on CT scan November 2015  but the patient refused further workup or treatment on multiple visits with Dr. Jana Hakim. Chest CT on Dec 16, 2015 shows progressive left axillary and subpectoral adenopathy, left upper extremity lymphedema, left breast soft tissue fullness and skin thickening. A left axillary lymph node biopsy on December 26, 2015 confirmed metastatic carcinoma, breast diagnostic profile pending. She has declined PET/CT at this time.      She's had a long talk with Dr. Jana Hakim. Her husband listened in on this but  she states he did not want to talk with me today. She is going to agree to chemotherapy and Dr. Jana Hakim is considering carboplatin and gemcitabine to see if we can downstage and eventually perform a left modified radical mastectomy. The plan would then be to follow surgery with radiation.      I discussed the indications, details, techniques, and numerous risk of Port-A-Cath insertion with her. She's had one before and so she has some insight. She is aware the risk of bleeding, infection, air embolus, pneumothorax, bilateral attempts, initial functioning and then failure to function requiring revision, and even inability to get a port in considering her prior problems. She understands all these issues. All of her questions are answered. She agrees with this plan. I told her we would try to put this in on the right side but we would go wherever the best vein was. I will follow her along during her neoadjuvant chemotherapy.   Other Problems  Breast Cancer High blood pressure  Past Surgical History Appendectomy Gallbladder Surgery - Laparoscopic Mastectomy Right. Sentinel Lymph Node Biopsy  Diagnostic Studies History  Colonoscopy never Mammogram within last year Pap Smear >5 years ago  Allergies  No Known Drug Allergies06/11/2015  Medication History  Lisinopril (10MG  Tablet, Oral) Active. Metoprolol Succinate ER (50MG  Tablet ER 24HR, Oral) Active. Hydrocodone-Acetaminophen  (5-325MG  Tablet, Oral) Active. Medications Reconciled Bone Essentials (Oral) Active.  Pregnancy / Birth History Age at menarche 13 years. Age of menopause 61-55 Gravida 5 Maternal age 70-30 Para 5    Review of Systems  General Not Present- Appetite Loss, Chills, Fatigue, Fever, Night Sweats, Weight Gain and Weight Loss. Skin Not Present- Change in Wart/Mole, Dryness, Hives, Jaundice, New Lesions, Non-Healing Wounds, Rash and Ulcer. HEENT Present- Wears glasses/contact lenses. Not Present- Earache, Hearing Loss, Hoarseness, Nose Bleed, Oral Ulcers, Ringing in the Ears, Seasonal Allergies, Sinus Pain, Sore Throat, Visual Disturbances and Yellow Eyes. Respiratory Not Present- Bloody sputum, Chronic Cough, Difficulty Breathing, Snoring and Wheezing. Breast Present- Breast Pain. Not Present- Breast Mass, Nipple Discharge and Skin Changes. Cardiovascular Present- Swelling of Extremities. Not Present- Chest Pain, Difficulty Breathing Lying Down, Leg Cramps, Palpitations, Rapid Heart Rate and Shortness of Breath. Gastrointestinal Not Present- Abdominal Pain, Bloating, Bloody Stool, Change in Bowel Habits, Chronic diarrhea, Constipation, Difficulty Swallowing, Excessive gas, Gets full quickly at meals, Hemorrhoids, Indigestion, Nausea, Rectal Pain and Vomiting. Female Genitourinary Not Present- Frequency, Nocturia, Painful Urination, Pelvic Pain and Urgency. Neurological Not Present- Decreased Memory, Fainting, Headaches, Numbness, Seizures, Tingling, Tremor, Trouble walking and Weakness. Psychiatric Not Present- Anxiety, Bipolar, Change in Sleep Pattern, Depression, Fearful and Frequent crying. Endocrine Not Present- Cold Intolerance, Excessive Hunger, Hair Changes, Heat Intolerance, Hot flashes and New Diabetes. Hematology Not Present- Easy Bruising, Excessive bleeding, Gland problems, HIV and Persistent Infections.  Vitals  Weight: 145 lb Height: 63.5in Body Surface Area: 1.7 m  Body Mass Index: 25.28 kg/m  Temp.: 97.40F(Temporal)  Pulse: 73 (Regular)  BP: 124/82 (Sitting, Left Arm, Standard)       Physical Exam  General Mental Status-Alert. General Appearance-Consistent with stated age. Hydration-Well hydrated. Voice-Normal.  Head and Neck Head-normocephalic, atraumatic with no lesions or palpable masses. Trachea-midline. Thyroid Gland Characteristics - normal size and consistency. Note: Careful exam reveals no anterior or posterior cervical adenopathy.   Eye Eyeball - Bilateral-Extraocular movements intact. Sclera/Conjunctiva - Bilateral-No scleral icterus.  Chest and Lung Exam Chest and lung exam reveals -quiet, even and easy respiratory effort with no use of accessory muscles and on auscultation, normal breath sounds, no adventitious sounds and normal vocal resonance. Inspection Chest Wall - Normal. Back - normal. Note: Lungs are clear bilaterally. Transverse scar below left clavicle where prior port was well-healed.   Breast Note: Right mastectomy wound well healed. The tissues are soft. No nodules or ulceration. No axillary adenopathy. 2+ right arm swelling. Left breast is filled with a hard mass. It does move around a little bit but basically fills the whole breast and there is erythema in the peri-areolar area suggesting the possibility of inflammatory change. There are large, palpable, somewhat mobile left axillary masses. There is significant left arm lymphedema but range of motion is good.   Cardiovascular Cardiovascular examination reveals -normal heart sounds, regular rate and rhythm with no murmurs and normal pedal pulses bilaterally.  Abdomen Inspection Inspection of the abdomen reveals - No Hernias. Skin - Scar - no surgical scars. Palpation/Percussion Palpation and Percussion of the abdomen reveal - Soft, Non Tender, No Rebound tenderness, No Rigidity (guarding) and No  hepatosplenomegaly. Auscultation Auscultation of the abdomen reveals - Bowel sounds normal.  Neurologic Neurologic evaluation reveals -alert and oriented  x 3 with no impairment of recent or remote memory. Mental Status-Normal.  Musculoskeletal Normal Exam - Left-Upper Extremity Strength Normal and Lower Extremity Strength Normal. Normal Exam - Right-Upper Extremity Strength Normal and Lower Extremity Strength Normal.  Lymphatic Head & Neck  General Head & Neck Lymphatics: Bilateral - Description - Normal. Axillary  General Axillary Region: Bilateral - Description - Normal. Tenderness - Non Tender. Femoral & Inguinal  Generalized Femoral & Inguinal Lymphatics: Bilateral - Description - Normal. Tenderness - Non Tender.    Assessment & Plan  CANCER OF CENTRAL PORTION OF LEFT FEMALE BREAST (C50.112)    You have a locally advanced cancer of the left breast with multiple left axillary lymph node metastasis. One of the largest lymph nodes has been biopsied and confirmed as metastatic cancer. The breast diagnostic profile is pending Examination of your breast reveals a cancer essentially filling the breast and some redness around the nipple. This may or may not reflect inflammatory breast cancer  At this point in time there is no role for surgery, but hopefully with chemotherapy the tumor will respond and we can eventually do a left mastectomy.  Dr. Jana Hakim has referred you to me to schedule a Port-A-Cath insertion. We have discussed the indications, techniques, and numerous risk Port-A-Cath insertion You have agreed to have the port inserted and you have agreed to chemotherapy  I recommend a PET/CT scan for staging. You stated that you would like to hold off on this for now.  You have requested a prescription for Vicodin for pain. you stated that Dr. Jana Hakim also writes prescriptions for Vicodin for the last 3 weeks. I'm going to give you one prescription for Vicodin  today After that, I think that all of your pain medication should be prescribed by one Dr. only.  Please read the patient information that we have given you.  PRIMARY MALIGNANT NEOPLASM OF LEFT BREAST WITH METASTASIS TO MOVABLE IPSILATERAL LEVEL 1 OR 2 AXILLARY LYMPH NODES (N1) (C50.912) HISTORY OF RIGHT BREAST CANCER (Z85.3) HISTORY OF MODIFIED RADICAL MASTECTOMY, RIGHT (Z90.11) HYPERTENSION, BENIGN (I10)   Ivet Guerrieri M. Dalbert Batman, M.D., [facility] General and Minimally invasive Surgery Breast and Colorectal Surgery Office:   [phone] Pager:   [phone]

## 2016-01-08 NOTE — Anesthesia Preprocedure Evaluation (Addendum)
Anesthesia Evaluation  Patient identified by MRN, date of birth, ID band Patient awake    Reviewed: Allergy & Precautions, H&P , NPO status , Patient's Chart, lab work & pertinent test results, reviewed documented beta blocker date and time   Airway Mallampati: II  TM Distance: >3 FB Neck ROM: Full  Mouth opening: Limited Mouth Opening  Dental  (+) Dental Advisory Given, Edentulous Upper   Pulmonary neg pulmonary ROS,    Pulmonary exam normal breath sounds clear to auscultation       Cardiovascular Exercise Tolerance: Good hypertension, Pt. on medications and Pt. on home beta blockers Normal cardiovascular exam Rhythm:regular Rate:Normal  LVH   Neuro/Psych negative neurological ROS  negative psych ROS   GI/Hepatic negative GI ROS, Neg liver ROS, GERD  Controlled,  Endo/Other  negative endocrine ROS  Renal/GU negative Renal ROS  negative genitourinary   Musculoskeletal   Abdominal   Peds  Hematology negative hematology ROS (+)   Anesthesia Other Findings Breast cancer  Reproductive/Obstetrics negative OB ROS                            Anesthesia Physical Anesthesia Plan  ASA: II  Anesthesia Plan: General   Post-op Pain Management:    Induction: Intravenous  Airway Management Planned: LMA  Additional Equipment:   Intra-op Plan:   Post-operative Plan:   Informed Consent: I have reviewed the patients History and Physical, chart, labs and discussed the procedure including the risks, benefits and alternatives for the proposed anesthesia with the patient or authorized representative who has indicated his/her understanding and acceptance.   Dental Advisory Given  Plan Discussed with: CRNA  Anesthesia Plan Comments:         Anesthesia Quick Evaluation

## 2016-01-09 ENCOUNTER — Ambulatory Visit (HOSPITAL_COMMUNITY)
Admission: RE | Admit: 2016-01-09 | Discharge: 2016-01-09 | Disposition: A | Discharge status: Home / Self Care | Payer: Medicare Other | Source: Ambulatory Visit | Attending: General Surgery | Admitting: General Surgery

## 2016-01-09 ENCOUNTER — Ambulatory Visit (HOSPITAL_COMMUNITY): Payer: Medicare Other

## 2016-01-09 ENCOUNTER — Ambulatory Visit (HOSPITAL_COMMUNITY): Payer: Medicare Other | Admitting: Anesthesiology

## 2016-01-09 ENCOUNTER — Encounter (HOSPITAL_COMMUNITY)
Admission: RE | Disposition: A | Discharge status: Home / Self Care | Payer: Self-pay | Source: Ambulatory Visit | Attending: General Surgery

## 2016-01-09 ENCOUNTER — Encounter (HOSPITAL_COMMUNITY): Payer: Self-pay | Admitting: *Deleted

## 2016-01-09 ENCOUNTER — Other Ambulatory Visit: Payer: Self-pay | Admitting: *Deleted

## 2016-01-09 DIAGNOSIS — C50112 Malignant neoplasm of central portion of left female breast: Secondary | ICD-10-CM | POA: Diagnosis not present

## 2016-01-09 DIAGNOSIS — C50912 Malignant neoplasm of unspecified site of left female breast: Secondary | ICD-10-CM | POA: Diagnosis present

## 2016-01-09 DIAGNOSIS — Z79891 Long term (current) use of opiate analgesic: Secondary | ICD-10-CM | POA: Diagnosis not present

## 2016-01-09 DIAGNOSIS — I10 Essential (primary) hypertension: Secondary | ICD-10-CM | POA: Diagnosis not present

## 2016-01-09 DIAGNOSIS — Z9011 Acquired absence of right breast and nipple: Secondary | ICD-10-CM | POA: Diagnosis not present

## 2016-01-09 DIAGNOSIS — C773 Secondary and unspecified malignant neoplasm of axilla and upper limb lymph nodes: Secondary | ICD-10-CM | POA: Insufficient documentation

## 2016-01-09 DIAGNOSIS — Z923 Personal history of irradiation: Secondary | ICD-10-CM | POA: Insufficient documentation

## 2016-01-09 DIAGNOSIS — R030 Elevated blood-pressure reading, without diagnosis of hypertension: Secondary | ICD-10-CM | POA: Diagnosis not present

## 2016-01-09 DIAGNOSIS — C779 Secondary and unspecified malignant neoplasm of lymph node, unspecified: Secondary | ICD-10-CM

## 2016-01-09 DIAGNOSIS — Z95828 Presence of other vascular implants and grafts: Secondary | ICD-10-CM

## 2016-01-09 DIAGNOSIS — Z452 Encounter for adjustment and management of vascular access device: Secondary | ICD-10-CM | POA: Diagnosis not present

## 2016-01-09 DIAGNOSIS — Z853 Personal history of malignant neoplasm of breast: Secondary | ICD-10-CM | POA: Insufficient documentation

## 2016-01-09 DIAGNOSIS — Z79899 Other long term (current) drug therapy: Secondary | ICD-10-CM | POA: Insufficient documentation

## 2016-01-09 HISTORY — DX: Malignant neoplasm of unspecified site of left female breast: C50.912

## 2016-01-09 HISTORY — PX: PORTACATH PLACEMENT: SHX2246

## 2016-01-09 HISTORY — DX: Secondary and unspecified malignant neoplasm of lymph node, unspecified: C77.9

## 2016-01-09 SURGERY — INSERTION, TUNNELED CENTRAL VENOUS DEVICE, WITH PORT
Anesthesia: General

## 2016-01-09 MED ORDER — LIDOCAINE HCL 1 % IJ SOLN
INTRAMUSCULAR | Status: DC | PRN
  Administered 2016-01-09: 100 mg via INTRADERMAL

## 2016-01-09 MED ORDER — HYDROCODONE-ACETAMINOPHEN 5-325 MG PO TABS: 1.0000 | ORAL_TABLET | Freq: Four times a day (QID) | ORAL | Status: DC | PRN

## 2016-01-09 MED ORDER — BUPIVACAINE-EPINEPHRINE (PF) 0.5% -1:200000 IJ SOLN
INTRAMUSCULAR | Status: DC | PRN
  Administered 2016-01-09: 11 mL via PERINEURAL

## 2016-01-09 MED ORDER — FENTANYL CITRATE (PF) 100 MCG/2ML IJ SOLN
INTRAMUSCULAR | Status: AC
  Filled 2016-01-09: qty 2

## 2016-01-09 MED ORDER — HEPARIN SOD (PORK) LOCK FLUSH 100 UNIT/ML IV SOLN
INTRAVENOUS | Status: DC | PRN
  Administered 2016-01-09: 500 [IU] via INTRAVENOUS

## 2016-01-09 MED ORDER — MIDAZOLAM HCL 5 MG/5ML IJ SOLN
INTRAMUSCULAR | Status: DC | PRN
Start: 2016-01-09 — End: 2016-01-09
  Administered 2016-01-09: 2 mg via INTRAVENOUS

## 2016-01-09 MED ORDER — BUPIVACAINE-EPINEPHRINE 0.5% -1:200000 IJ SOLN
INTRAMUSCULAR | Status: AC
  Filled 2016-01-09: qty 1

## 2016-01-09 MED ORDER — ACETAMINOPHEN 650 MG RE SUPP
650.0000 mg | RECTAL | Status: DC | PRN
  Filled 2016-01-09: qty 1

## 2016-01-09 MED ORDER — HYDRALAZINE HCL 20 MG/ML IJ SOLN
20.0000 mg | INTRAMUSCULAR | Status: DC | PRN
  Administered 2016-01-09: 20 mg via INTRAVENOUS

## 2016-01-09 MED ORDER — OXYCODONE HCL 5 MG PO TABS
5.0000 mg | ORAL_TABLET | ORAL | Status: DC | PRN
  Administered 2016-01-09: 5 mg via ORAL
  Filled 2016-01-09: qty 1

## 2016-01-09 MED ORDER — SODIUM CHLORIDE 0.9 % IV SOLN
Freq: Once | INTRAVENOUS | Status: AC
  Administered 2016-01-09: 08:00:00
  Filled 2016-01-09: qty 1.2

## 2016-01-09 MED ORDER — PROPOFOL 10 MG/ML IV BOLUS
INTRAVENOUS | Status: DC | PRN
  Administered 2016-01-09: 150 mg via INTRAVENOUS

## 2016-01-09 MED ORDER — LABETALOL HCL 5 MG/ML IV SOLN
5.0000 mg | INTRAVENOUS | Status: AC | PRN
  Administered 2016-01-09 (×4): 5 mg via INTRAVENOUS

## 2016-01-09 MED ORDER — SODIUM CHLORIDE 0.9% FLUSH: 3.0000 mL | INTRAVENOUS | Status: DC | PRN

## 2016-01-09 MED ORDER — CHLORHEXIDINE GLUCONATE 4 % EX LIQD
1.0000 | Freq: Once | CUTANEOUS | Status: DC
Start: 2016-01-09 — End: 2016-01-09

## 2016-01-09 MED ORDER — LACTATED RINGERS IV SOLN
INTRAVENOUS | Status: DC | PRN
  Administered 2016-01-09: 07:00:00 via INTRAVENOUS

## 2016-01-09 MED ORDER — DEXAMETHASONE SODIUM PHOSPHATE 10 MG/ML IJ SOLN
INTRAMUSCULAR | Status: DC | PRN
  Administered 2016-01-09: 10 mg via INTRAVENOUS

## 2016-01-09 MED ORDER — HYDRALAZINE HCL 20 MG/ML IJ SOLN
INTRAMUSCULAR | Status: AC
  Filled 2016-01-09: qty 1

## 2016-01-09 MED ORDER — ONDANSETRON HCL 4 MG/2ML IJ SOLN
INTRAMUSCULAR | Status: DC | PRN
  Administered 2016-01-09: 4 mg via INTRAVENOUS

## 2016-01-09 MED ORDER — 0.9 % SODIUM CHLORIDE (POUR BTL) OPTIME
TOPICAL | Status: DC | PRN
  Administered 2016-01-09: 1000 mL

## 2016-01-09 MED ORDER — CEFAZOLIN SODIUM-DEXTROSE 2-4 GM/100ML-% IV SOLN
2.0000 g | INTRAVENOUS | Status: AC
  Administered 2016-01-09: 2 g via INTRAVENOUS

## 2016-01-09 MED ORDER — CEFAZOLIN SODIUM-DEXTROSE 2-4 GM/100ML-% IV SOLN
INTRAVENOUS | Status: AC
  Filled 2016-01-09: qty 100

## 2016-01-09 MED ORDER — MIDAZOLAM HCL 2 MG/2ML IJ SOLN
INTRAMUSCULAR | Status: AC
  Filled 2016-01-09: qty 2

## 2016-01-09 MED ORDER — SODIUM CHLORIDE 0.9 % IV SOLN: 250.0000 mL | INTRAVENOUS | Status: DC | PRN

## 2016-01-09 MED ORDER — SODIUM CHLORIDE 0.9% FLUSH: 3.0000 mL | Freq: Two times a day (BID) | INTRAVENOUS | Status: DC

## 2016-01-09 MED ORDER — SODIUM CHLORIDE 0.9 % IV SOLN: INTRAVENOUS | Status: DC

## 2016-01-09 MED ORDER — LABETALOL HCL 5 MG/ML IV SOLN
INTRAVENOUS | Status: AC
  Filled 2016-01-09: qty 4

## 2016-01-09 MED ORDER — DEXAMETHASONE SODIUM PHOSPHATE 10 MG/ML IJ SOLN
INTRAMUSCULAR | Status: AC
  Filled 2016-01-09: qty 1

## 2016-01-09 MED ORDER — LIDOCAINE HCL (CARDIAC) 20 MG/ML IV SOLN
INTRAVENOUS | Status: AC
  Filled 2016-01-09: qty 5

## 2016-01-09 MED ORDER — ONDANSETRON HCL 4 MG/2ML IJ SOLN
INTRAMUSCULAR | Status: AC
  Filled 2016-01-09: qty 2

## 2016-01-09 MED ORDER — FENTANYL CITRATE (PF) 100 MCG/2ML IJ SOLN
INTRAMUSCULAR | Status: DC | PRN
  Administered 2016-01-09 (×2): 25 ug via INTRAVENOUS

## 2016-01-09 MED ORDER — CHLORHEXIDINE GLUCONATE 4 % EX LIQD: 1.0000 "application " | Freq: Once | CUTANEOUS | Status: DC

## 2016-01-09 MED ORDER — HEPARIN SOD (PORK) LOCK FLUSH 100 UNIT/ML IV SOLN
INTRAVENOUS | Status: AC
  Filled 2016-01-09: qty 5

## 2016-01-09 MED ORDER — ACETAMINOPHEN 325 MG PO TABS: 650.0000 mg | ORAL_TABLET | ORAL | Status: DC | PRN

## 2016-01-09 MED ORDER — FENTANYL CITRATE (PF) 100 MCG/2ML IJ SOLN: 25.0000 ug | INTRAMUSCULAR | Status: DC | PRN

## 2016-01-09 MED ORDER — LACTATED RINGERS IV SOLN
INTRAVENOUS | Status: DC
  Administered 2016-01-09: 11:00:00 via INTRAVENOUS

## 2016-01-09 MED ORDER — FENTANYL CITRATE (PF) 100 MCG/2ML IJ SOLN
25.0000 ug | INTRAMUSCULAR | Status: DC | PRN
  Administered 2016-01-09 (×2): 50 ug via INTRAVENOUS

## 2016-01-09 MED ORDER — PROPOFOL 10 MG/ML IV BOLUS
INTRAVENOUS | Status: AC
  Filled 2016-01-09: qty 20

## 2016-01-09 SURGICAL SUPPLY — 31 items
ADH SKN CLS APL DERMABOND .7 (GAUZE/BANDAGES/DRESSINGS) ×1
BAG DECANTER FOR FLEXI CONT (MISCELLANEOUS) ×3 IMPLANT
BLADE HEX COATED 2.75 (ELECTRODE) ×1 IMPLANT
BLADE SURG 15 STRL LF DISP TIS (BLADE) ×1 IMPLANT
BLADE SURG 15 STRL SS (BLADE) ×3
CHLORAPREP W/TINT 26ML (MISCELLANEOUS) ×3 IMPLANT
COVER SURGICAL LIGHT HANDLE (MISCELLANEOUS) ×1 IMPLANT
DECANTER SPIKE VIAL GLASS SM (MISCELLANEOUS) ×3 IMPLANT
DERMABOND ADVANCED (GAUZE/BANDAGES/DRESSINGS) ×2
DERMABOND ADVANCED .7 DNX12 (GAUZE/BANDAGES/DRESSINGS) IMPLANT
DRAPE C-ARM 42X120 X-RAY (DRAPES) ×3 IMPLANT
DRAPE LAPAROSCOPIC ABDOMINAL (DRAPES) ×3 IMPLANT
ELECT PENCIL ROCKER SW 15FT (MISCELLANEOUS) ×3 IMPLANT
ELECT REM PT RETURN 9FT ADLT (ELECTROSURGICAL) ×3
ELECTRODE REM PT RTRN 9FT ADLT (ELECTROSURGICAL) ×1 IMPLANT
GAUZE SPONGE 4X4 16PLY XRAY LF (GAUZE/BANDAGES/DRESSINGS) ×3 IMPLANT
GLOVE EUDERMIC 7 POWDERFREE (GLOVE) ×3 IMPLANT
GOWN STRL REUS W/TWL XL LVL3 (GOWN DISPOSABLE) ×8 IMPLANT
KIT BASIN OR (CUSTOM PROCEDURE TRAY) ×3 IMPLANT
KIT PORT POWER 8FR ISP CVUE (Catheter) ×2 IMPLANT
MARKER SKIN DUAL TIP RULER LAB (MISCELLANEOUS) ×3 IMPLANT
NEEDLE HYPO 22GX1.5 SAFETY (NEEDLE) ×3 IMPLANT
PACK BASIC VI WITH GOWN DISP (CUSTOM PROCEDURE TRAY) ×3 IMPLANT
SUT MNCRL AB 4-0 PS2 18 (SUTURE) ×3 IMPLANT
SUT PROLENE 2 0 SH DA (SUTURE) ×3 IMPLANT
SUT VIC AB 3-0 SH 18 (SUTURE) ×3 IMPLANT
SYR 20CC LL (SYRINGE) ×3 IMPLANT
SYR BULB IRRIGATION 50ML (SYRINGE) IMPLANT
SYRINGE 10CC LL (SYRINGE) ×3 IMPLANT
TOWEL OR 17X26 10 PK STRL BLUE (TOWEL DISPOSABLE) ×3 IMPLANT
TOWEL OR NON WOVEN STRL DISP B (DISPOSABLE) ×3 IMPLANT

## 2016-01-09 NOTE — Progress Notes (Signed)
Apresoline 20 mg IVP given slowly for elevated blood pressures- checked with Reuel Boom  Pharmacist

## 2016-01-09 NOTE — Anesthesia Postprocedure Evaluation (Addendum)
Anesthesia Post Note  Patient: Janice Powell  Procedure(s) Performed: Procedure(s) (LRB): INSERTION PORT-A-CATH (N/A)  Patient location during evaluation: PACU Anesthesia Type: General Level of consciousness: awake and alert Pain management: pain level controlled Vital Signs Assessment: post-procedure vital signs reviewed and stable Respiratory status: spontaneous breathing, nonlabored ventilation, respiratory function stable and patient connected to nasal cannula oxygen Cardiovascular status: blood pressure returned to baseline and stable Postop Assessment: no signs of nausea or vomiting Anesthetic complications: no Comments: Treated very high systolic BP with 20 mg hydralazine and 20 mg labetalol and explained the urgency of her going to her primary care doctor for better treatment of her hypertension.    Last Vitals:  Filed Vitals:   01/09/16 0912 01/09/16 0915  BP: 223/103 225/89  Pulse:    Temp:    Resp: 16 12    Last Pain: There were no vitals filed for this visit.               Katelyn Kohlmeyer L

## 2016-01-09 NOTE — Transfer of Care (Signed)
Immediate Anesthesia Transfer of Care Note  Patient: Janice Powell  Procedure(s) Performed: Procedure(s): INSERTION PORT-A-CATH (N/A)  Patient Location: PACU  Anesthesia Type:General  Level of Consciousness: awake  Airway & Oxygen Therapy: Patient Spontanous Breathing and Patient connected to face mask  Post-op Assessment: Report given to RN  Post vital signs: stable  Last Vitals:  Filed Vitals:   01/09/16 0546 01/09/16 0551  BP: 208/95 172/94  Pulse: 78   Temp: 36.9 C   Resp: 16     Last Pain: There were no vitals filed for this visit.    Patients Stated Pain Goal: 3 ([id number])  Complications: No apparent anesthesia complications

## 2016-01-09 NOTE — Progress Notes (Signed)
Dr. Landry Dyke  In to see patient.- orders given

## 2016-01-09 NOTE — Discharge Instructions (Signed)
PORT-A-CATH: POST OP INSTRUCTIONS  Always review your discharge instruction sheet given to you by the facility where your surgery was performed.   1. A prescription for pain medication may be given to you upon discharge. Take your pain medication as prescribed, if needed. If narcotic pain medicine is not needed, then you make take acetaminophen (Tylenol) or ibuprofen (Advil) as needed.  2. Take your usually prescribed medications unless otherwise directed. 3. If you need a refill on your pain medication, please contact our office. All narcotic pain medicine now requires a paper prescription.  Phoned in and fax refills are no longer allowed by law.  Prescriptions will not be filled after 5 pm or on weekends.  4. You should follow a light diet for the remainder of the day after your procedure. 5. Most patients will experience some mild swelling and/or bruising in the area of the incision. It may take several days to resolve. 6. It is common to experience some constipation if taking pain medication after surgery. Increasing fluid intake and taking a stool softener (such as Colace) will usually help or prevent this problem from occurring. A mild laxative (Milk of Magnesia or Miralax) should be taken according to package directions if there are no bowel movements after 48 hours.  7. Unless discharge instructions indicate otherwise, you may remove your bandages 48 hours after surgery, and you may shower at that time. You may have steri-strips (small white skin tapes) in place directly over the incision.  These strips should be left on the skin for 7-10 days.  If your surgeon used Dermabond (skin glue) on the incision, you may shower in 24 hours.  The glue will flake off over the next 2-3 weeks.  8. If your port is left accessed at the end of surgery (needle left in port), the dressing cannot get wet and should only by changed by a healthcare professional. When the port is no longer accessed (when the  needle has been removed), follow step 7.   9. ACTIVITIES:  Limit activity involving your arms for the next 72 hours. Do no strenuous exercise or activity for 1 week. You may drive when you are no longer taking prescription pain medication, you can comfortably wear a seatbelt, and you can maneuver your car. 10.You may need to see your doctor in the office for a follow-up appointment.  Please       check with your doctor.  11.When you receive a new Port-a-Cath, you will get a product guide and        ID card.  Please keep them in case you need them.  WHEN TO CALL YOUR DOCTOR [phone]): 1. Fever over 101.0 2. Chills 3. Continued bleeding from incision 4. Increased redness and tenderness at the site 5. Shortness of breath, difficulty breathing   The clinic staff is available to answer your questions during regular business hours. Please dont hesitate to call and ask to speak to one of the nurses or medical assistants for clinical concerns. If you have a medical emergency, go to the nearest emergency room or call 911.  A surgeon from [facility] is always on call at the hospital.     For further information, please visit [email or web address]    General Anesthesia, Adult, Care After Refer to this sheet in the next few weeks. These instructions provide you with information on caring for yourself after your procedure. Your health care provider may also give you more specific instructions. Your treatment  has been planned according to current medical practices, but problems sometimes occur. Call your health care provider if you have any problems or questions after your procedure. WHAT TO EXPECT AFTER THE PROCEDURE After the procedure, it is typical to experience:  Sleepiness.  Nausea and vomiting. HOME CARE INSTRUCTIONS  For the first 24 hours after general anesthesia:  Have a responsible person with you.  Do not drive a car. If you are alone, do not take  public transportation.  Do not drink alcohol.  Do not take medicine that has not been prescribed by your health care provider.  Do not sign important papers or make important decisions.  You may resume a normal diet and activities as directed by your health care provider.  Change bandages (dressings) as directed.  If you have questions or problems that seem related to general anesthesia, call the hospital and ask for the anesthetist or anesthesiologist on call. SEEK MEDICAL CARE IF:  You have nausea and vomiting that continue the day after anesthesia.  You develop a rash. SEEK IMMEDIATE MEDICAL CARE IF:   You have difficulty breathing.  You have chest pain.  You have any allergic problems.   This information is not intended to replace advice given to you by your health care provider. Make sure you discuss any questions you have with your health care provider.   Document Released: 10/19/2000 Document Revised: 08/03/2014 Document Reviewed: 11/11/2011 Elsevier Interactive Patient Education 2016 [employer].  Anesthesia department recommends that you make an appointment with your medical Dr for tomorrow to followup on high blood pressure.  Deep breathing/coughing and leg exercises as directed.

## 2016-01-09 NOTE — Progress Notes (Signed)
Dr. Landry Dyke in- made aware of patient's blood pressures and heart rates

## 2016-01-09 NOTE — Anesthesia Procedure Notes (Signed)
Procedure Name: LMA Insertion Date/Time: 01/09/2016 7:10 AM Performed by: Gaston Islam EVETTE Pre-anesthesia Checklist: Patient identified, Suction available and Patient being monitored Patient Re-evaluated:Patient Re-evaluated prior to inductionOxygen Delivery Method: Circle system utilized Preoxygenation: Pre-oxygenation with 100% oxygen Intubation Type: IV induction Ventilation: Mask ventilation without difficulty LMA: LMA inserted LMA Size: 4.0 Number of attempts: 1 Placement Confirmation: positive ETCO2,  CO2 detector and breath sounds checked- equal and bilateral Tube secured with: Tape Dental Injury: Teeth and Oropharynx as per pre-operative assessment

## 2016-01-09 NOTE — Progress Notes (Signed)
X-ray results noted 

## 2016-01-09 NOTE — Progress Notes (Signed)
Labetalol 5 mg IVP repeated for elevated blood pressures. 

## 2016-01-09 NOTE — Progress Notes (Signed)
Portable upright chest  X-ray done

## 2016-01-09 NOTE — Progress Notes (Signed)
Labetalol 5 mg IVP repeated per Dr. Clerance Lav order

## 2016-01-09 NOTE — Progress Notes (Signed)
Dr. Landry Dyke made aware of patient's blood pressures- O.K. To go to Short Stay.

## 2016-01-09 NOTE — Op Note (Signed)
Patient Name:           Janice Powell   Date of Surgery:        01/09/2016  Pre op Diagnosis:      Cancer left breast                                       Metastasis to  ipsilateral level I and level II axillary lymph nodes                                       History cancer right breast  Post op Diagnosis:    Same  Procedure:                 Insertion of 8 French Power port Clear-Vue tunneled venous vascular access device                                      Use of fluoroscopy for guidance and positioning  Surgeon:                     Edsel Petrin. Dalbert Batman, M.D., FACS  Assistant:                      OR staff  Operative Indications:    This is a 70 year old African-American female, former patient of mine. She is referred to me by Dr. Jana Hakim for Port-A-Cath insertion to assist in management of neoadjuvant chemotherapy because of the new locally advanced cancer of the left breast.  Past history is significant in that she noticed a mass in the right breast in 2011 but she did not report this until 2013. Imaging studies on March 02, 2012 showed a 6.7 cm irregular mass in the inner aspect of the right breast with nipple retraction and skin erythema around the nipple. A large lymph node was noted in the right axilla. Physical exam confirmed a hard breast with erythema around the right nipple extending inferiorly and multiple enlarged lymph nodes. 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 She initially declined chemotherapy but eventually reconsidered. Port-A-Cath was placed on August 15, 2012 and she received neoadjuvant chemotherapy. MRI performed on January 19, 2013 showed good response in the right breast with minimal enhancement. The lymph nodes were smaller. On March 13, 2013 she underwent right modified radical mastectomy and removal of Port-A-Cath. final pathology showed complete pathologic response in the breast, however 9 out of 9 lymph nodes  were positive for metastatic disease. She completed radiation therapy and physical therapy to the right shoulder. She's had chronic swelling of the right arm but not excessive and she does not use asleep. She's had no recurrence on the right side.  Considering that past history, she now presents with a locally advanced cancer of the left breast. Left axillary adenopathy was noted on mammogram in June 2015 and confirmed on CT scan November 2015 but the patient refused further workup or treatment on multiple visits with Dr. Jana Hakim. Chest CT on Dec 16, 2015 shows progressive left axillary and subpectoral adenopathy, left upper extremity lymphedema, left breast soft tissue fullness and skin thickening. A left axillary lymph node biopsy on December 26, 2015 confirmed  metastatic carcinoma, breast diagnostic profile pending.  Exam shows a mass filling the central portion of the breast, slightly mobile with significant central skin changes but no true ulceration or drainage.  Axillary nodes are obviously palpable and only slightly movable.  She has significant lymphedema of the left arm.She has declined PET/CT at this time.  She's had a long talk with Dr. Jana Hakim. . She is going to agree to chemotherapy and Dr. Jana Hakim is considering carboplatin and gemcitabine to see if we can downstage and eventually perform a left modified radical mastectomy. She is not a surgical candidate at this time due to the large, fixed nature of the tumor.  The plan would then be to follow surgery with radiation.  I discussed the indications, details, techniques, and numerous risk of Port-A-Cath insertion with her. She's had one before and so she has some insight. She is aware the risk of bleeding, infection, air embolus, pneumothorax, bilateral attempts, initial functioning and then failure to function requiring revision, and even inability to get a port in considering her prior problems. She understands all these  issues. All of her questions are answered. She agrees with this plan.   Operative Findings:       I was able to insert the port and catheter through the right subclavian vein.  Fluoroscopy was used to draw a template on the chest wall to guide length of catheter and positioning of catheter tip.  C-arm imaging in the operating room at the completion of the case showed the catheter tip to be in the superior  Vena cava near the right atrium and no deformity of the catheter anywhere along its course.  Procedure in Detail:          Following the induction of general LMA anesthesia the patient was positioned with a small roll behind her shoulders and arms tucked at her sides.  The neck and chest was prepped and draped in a sterile fashion.  Surgical timeout was performed.  Intravenous antibiotics were given.  0.5% Marcaine with epinephrine was used as local infiltration anesthetic.    A right subclavian venipuncture was performed.  I got blood return on the first pass and threaded a guidewire into the central venous circulation under fluoroscopic guidance.  A small incision was made at the wire insertion site.  Using a marking pin I drew a template on the chest wall to guide positioning of the catheter.  A transverse incision was made about 3 cm below the midpoint of the right clavicle.  Subcutaneous pocket was created.  The catheter was passed from the wire insertion site to the port pocket site.  Using the template on the chest wall I measured and cut the catheter 19.5 cm.  The catheter was secured to the port with the locking device and the port and catheter flushed with heparinized saline.  The port was sutured to the pectoralis fascia with 3 interrupted sutures of 2-0 Prolene.  The dilator and peel-away sheath were inserted over the guidewire into the central venous circulation.  The dilator and guidewire were removed.  The catheter was slowly threaded through the peel-away sheath and the peel-away sheath  removed.  I had excellent blood return and the catheter flushed well.  C-arm imaging looked good as described above.  The port and catheter were flushed with concentrated heparin.  The subcutaneous tissue was closed with 3-0 Vicryl sutures and the skin closed with subcuticular 4-0 Monocryl and Dermabond.  The patient tolerated the procedure well  and taken to PACU in stable condition.  EBL 10 mL.  Counts correct.  Complications none.     Edsel Petrin. Dalbert Batman, M.D., FACS General and Minimally Invasive Surgery Breast and Colorectal Surgery  01/09/2016 7:54 AM

## 2016-01-09 NOTE — Progress Notes (Signed)
Labetalol 5 mg IVP begun for elevated blood pressures

## 2016-01-09 NOTE — Progress Notes (Signed)
Dr. Landry Dyke made aware of patient's blood pressures and heart rates

## 2016-01-09 NOTE — Interval H&P Note (Signed)
History and Physical Interval Note:  01/09/2016 6:50 AM  Janice Powell  has presented today for surgery, with the diagnosis of left breast cancer  The various methods of treatment have been discussed with the patient and family. After consideration of risks, benefits and other options for treatment, the patient has consented to  Procedure(s): INSERTION PORT-A-CATH WITH [place] (N/A) as a surgical intervention .  The patient's history has been reviewed, patient examined, no change in status, stable for surgery.  I have reviewed the patient's chart and labs.  Questions were answered to the patient's satisfaction.     [name]

## 2016-01-10 ENCOUNTER — Encounter: Payer: Self-pay | Admitting: Oncology

## 2016-01-10 ENCOUNTER — Ambulatory Visit (HOSPITAL_BASED_OUTPATIENT_CLINIC_OR_DEPARTMENT_OTHER): Payer: Medicare Other | Admitting: Oncology

## 2016-01-10 VITALS — BP 173/67 | HR 69 | Temp 98.4°F | Resp 18 | Wt 143.9 lb

## 2016-01-10 DIAGNOSIS — G893 Neoplasm related pain (acute) (chronic): Secondary | ICD-10-CM

## 2016-01-10 DIAGNOSIS — I890 Lymphedema, not elsewhere classified: Secondary | ICD-10-CM

## 2016-01-10 DIAGNOSIS — C773 Secondary and unspecified malignant neoplasm of axilla and upper limb lymph nodes: Secondary | ICD-10-CM

## 2016-01-10 DIAGNOSIS — C50911 Malignant neoplasm of unspecified site of right female breast: Secondary | ICD-10-CM | POA: Insufficient documentation

## 2016-01-10 DIAGNOSIS — C50411 Malignant neoplasm of upper-outer quadrant of right female breast: Secondary | ICD-10-CM | POA: Diagnosis not present

## 2016-01-10 DIAGNOSIS — N63 Unspecified lump in breast: Secondary | ICD-10-CM | POA: Diagnosis not present

## 2016-01-10 DIAGNOSIS — N6320 Unspecified lump in the left breast, unspecified quadrant: Secondary | ICD-10-CM

## 2016-01-10 DIAGNOSIS — C50912 Malignant neoplasm of unspecified site of left female breast: Secondary | ICD-10-CM

## 2016-01-10 MED ORDER — DEXAMETHASONE 4 MG PO TABS: 8.0000 mg | ORAL_TABLET | Freq: Every day | ORAL | Status: DC

## 2016-01-10 MED ORDER — LIDOCAINE-PRILOCAINE 2.5-2.5 % EX CREA: TOPICAL_CREAM | CUTANEOUS | Status: DC

## 2016-01-10 MED ORDER — PROCHLORPERAZINE MALEATE 10 MG PO TABS: 10.0000 mg | ORAL_TABLET | Freq: Four times a day (QID) | ORAL | Status: DC | PRN

## 2016-01-10 MED ORDER — LORAZEPAM 0.5 MG PO TABS: 0.5000 mg | ORAL_TABLET | Freq: Every evening | ORAL | Status: DC | PRN

## 2016-01-10 NOTE — Progress Notes (Signed)
Introduced myself as her FA.  Informed her w/ her insurance she shouldn't need copay assistance but I gave her my card for any questions or concerns she may have in the future.

## 2016-01-10 NOTE — Progress Notes (Signed)
Patient ID: Janice Powell, female   DOB: 19-Aug-1945, 70 y.o.   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 More recently her husband twisted her arm to get the mass looked at, and she brought it to Janice Powell attention. Diagnostic mammography and right ultrasonography at [facility] 03/02/2012 showed a 6.78 cm irregular mass in the inner aspect of the right breast. There was nipple retraction and erythema around the nipple. The largest lymph node was noted in the right axilla. By ultrasound the large irregular hypoechoic mass was noted in the breast with multiple enlarged axillary lymph nodes. Physical exam confirmed a hard breast with erythema around the right nipple extending inferiorly. The nipple is slightly inverted.  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   Her subsequent history is as detailed below  INTERVAL HISTORY: Janice Powell returns today for follow-up of her left-sided breast cancer -- her husband Janice Powell could not come today because he is mowing the lawn she says. Since her last visit here Janice Powell underwent left sided subclavian Port-A-Cath insertion 01/09/2016. The procedure was complicated by elevated blood pressures which were treated with labetalol. Otherwise she did well, and she does not have significant pain related to the port site.   She is here today to discuss supportive therapy and anti-emetics for her chemotherapy, scheduled to  start 01/13/2016. The plan is for her to receive carboplatin and gemcitabine on days 1 and 8 of each 21 day cycle for 3-4 cycles after which she will be restaged.  REVIEW OF SYSTEMS: Janice Powell did well with the biopsy, with no complications. She continues to have significant left upper extremity lymphedema, so far without erythema. She does have pain in the left shoulder, left axilla, and left breast area. She is taking Norco 4 times a day for that with good control. She is taking MiraLAX as her bowel prophylaxis and that is working well. A detailed review of systems today was otherwise stable  PAST MEDICAL HISTORY: Past Medical History  Diagnosis Date  . Hypertension   . Pneumonia     hx  . GERD (gastroesophageal reflux disease)     occ  . Osteopenia   . Breast cancer ([place]) 03/08/13    right breast - Invasive Ductal Carcinoma   . S/P radiation therapy     1) Right Chest Wall and IM nodes / 50 Gy in 25 fractions /2) Right Supraclavicular fossa / 50 Gy in 25 fractions/1) Right Chest Wall and IM nodes / 50 Gy in 25 fractions /2) Right Supraclavicular fossa / 50 Gy in 25 fractions/3) Right Posterior Axillary boost / 6.55 Gy in 25 fractions/4) Right Chest Wall Scar boost / 10 Gy in 5 fractions      . Hx antineoplastic chemotherapy     two cycles of neoadjuvant cyclophosphamide, docetaxel, doxorubicin ["TAC"] at standard doses with neulasta support; refused neulasta with cycles 2 and 4; tolerated treatment poorly (b) docetaxel was held for final 4 cycles;  completed cycle 6 neoadjuvant chemotherapy 12/27/2012    . Wears glasses   . Primary malignant neoplasm of left breast with stage 2 nodal metastasis per American Joint Committee on Cancer 7th edition (N2) ([place]) 01/09/2016  GERD osteopenia  PAST SURGICAL HISTORY: Past Surgical History  Procedure Laterality Date  . Appendectomy  1992  . Tubal ligation  1979  . Cholecystectomy  2000  . Portacath placement  08/15/2012    Procedure: INSERTION  PORT-A-CATH;  Surgeon: Janice Hector, MD;  Location: [place];  Service: General;  Laterality: Left;  Marland Kitchen Mastectomy Right 03/13/2013  . Breast biopsy Right   . Port-a-cath removal  03/13/2013  . Mastectomy modified radical Right 03/13/2013    Procedure: MASTECTOMY MODIFIED RADICAL;  Surgeon: Janice Hector, MD;  Location: [place];  Service: General;  Laterality: Right;  . Port-a-cath removal Left 03/13/2013    Procedure: REMOVAL PORT-A-CATH;  Surgeon: Janice Hector, MD;  Location: [place];  Service: General;  Laterality: Left;  . Portacath placement N/A 01/09/2016    Procedure: INSERTION PORT-A-CATH;  Surgeon: Janice Skates, MD;  Location: [facility];  Service: General;  Laterality: N/A;    FAMILY HISTORY Family History  Problem Relation Age of Onset  . Heart disease Mother    the patient's father died in his sleep at age 51. The patient's mother died at the age of 12 from a myocardial infarction. The patient has one brother and 2 sisters, all surviving. There is no history of breast or ovarian cancer in the family.  GYNECOLOGIC HISTORY:  (Reviewed 12/07/2013) Menarche age 70, first live birth age 46, she is GX P5, menopause age 57. She did not take hormone replacement.  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 Her husband Janice Powell worked in the post office 1 years, and is also now retired. He is a former Company secretary. Son Janice Powell lives in [place] and manages an [employer] store. Daughter Janice Powell also lives in [place] and manages the IT Department at [employer]. Son Janice Powell is a Engineer, materials. Daughter Janice Powell is a Actuary for [employer]. Son Janice Powell is an Actuary. The patient has 8 grandchildren. She attends [employer]   ADVANCED DIRECTIVES: Not in place  HEALTH MAINTENANCE:  (Updated 12/07/2013) Social History  Substance Use Topics  . Smoking status: Never Smoker   . Smokeless  tobacco: Never Used  . Alcohol Use: No     Colonoscopy: Not on file  PAP: Not on file  Bone density:  January 2011   Lipid panel:  Not on file/ Dr. Shelia Media  No Known Allergies  Current Outpatient Prescriptions  Medication Sig Dispense Refill  . HYDROcodone-acetaminophen (NORCO) 5-325 MG tablet Take 1-2 tablets by mouth every 6 (six) hours as needed. 30 tablet 0  . HYDROcodone-acetaminophen (NORCO/VICODIN) 5-325 MG tablet Take 1-2 tablets by mouth every 6 (six) hours as needed for moderate pain.    Marland Kitchen lisinopril (PRINIVIL,ZESTRIL) 10 MG tablet TAKE 1 TABLET EVERY DAY 90 tablet 1  . metoprolol succinate (TOPROL-XL) 50 MG 24 hr tablet Take 1 tablet (50 mg total) by mouth daily. 30 tablet 0  . omega-3 acid ethyl esters (LOVAZA) 1 G capsule Take 1 g by mouth daily.     . potassium chloride (K-DUR) 10 MEQ tablet Take 1 tablet (10 mEq total) by mouth daily. 60 tablet 5   No current facility-administered medications for this visit.  OBJECTIVE: Middle-aged [place] American woman  There were no vitals filed for this visit.   There is no weight on file to calculate BMI.    ECOG FS: 1 There were no vitals filed for this visit. Sclerae unicteric, EOMs intact Oropharynx clear and moist No cervical or supraclavicular adenopathy Lungs no rales or rhonchi Heart regular rate and rhythm Abd soft, nontender, positive bowel sounds MSK no focal spinal tenderness, no upper extremity lymphedema Neuro: nonfocal, well oriented, appropriate affect Breasts: Left breast is imaged below   Photo of left breast 12/17/2015    LAB RESULTS: CBC    Component Value Date/Time   WBC 6.0 01/07/2016 1330   WBC 6.1 12/16/2015 1614   RBC 4.12 01/07/2016 1330   RBC 4.18 12/16/2015 1614   HGB 13.3 01/07/2016 1330   HGB 13.2 12/16/2015 1614   HCT 40.4 01/07/2016 1330   HCT 40.6 12/16/2015 1614   PLT 300 01/07/2016 1330   PLT 256 12/16/2015 1614   MCV 98.1 01/07/2016 1330   MCV 97.2 12/16/2015 1614   MCH  32.3 01/07/2016 1330   MCH 31.7 12/16/2015 1614   MCHC 32.9 01/07/2016 1330   MCHC 32.6 12/16/2015 1614   RDW 14.4 01/07/2016 1330   RDW 13.9 12/16/2015 1614   LYMPHSABS 1.8 01/07/2016 1330   LYMPHSABS 1.9 12/16/2015 1614   MONOABS 0.6 01/07/2016 1330   MONOABS 0.7 12/16/2015 1614   EOSABS 0.0 01/07/2016 1330   EOSABS 0.0 12/16/2015 1614   BASOSABS 0.0 01/07/2016 1330   BASOSABS 0.1 12/16/2015 1614    CMP     Component Value Date/Time   NA 139 01/07/2016 1330   NA 143 12/16/2015 1614   K 4.1 01/07/2016 1330   K 3.2* 12/16/2015 1614   CL 102 01/07/2016 1330   CL 107 01/11/2013 0853   CO2 30 01/07/2016 1330   CO2 29 12/16/2015 1614   GLUCOSE 99 01/07/2016 1330   GLUCOSE 97 12/16/2015 1614   GLUCOSE 123* 01/11/2013 0853   BUN 11 01/07/2016 1330   BUN 12.8 12/16/2015 1614   CREATININE 0.80 01/07/2016 1330   CREATININE 0.8 12/16/2015 1614   CALCIUM 9.0 01/07/2016 1330   CALCIUM 9.5 12/16/2015 1614   PROT 7.5 01/07/2016 1330   PROT 7.7 12/16/2015 1614   ALBUMIN 4.1 01/07/2016 1330   ALBUMIN 3.8 12/16/2015 1614   AST 32 01/07/2016 1330   AST 17 12/16/2015 1614   ALT 17 01/07/2016 1330   ALT 16 12/16/2015 1614   ALKPHOS 41 01/07/2016 1330   ALKPHOS 53 12/16/2015 1614   BILITOT 1.4* 01/07/2016 1330   BILITOT 0.46 12/16/2015 1614   GFRNONAA >60 01/07/2016 1330   GFRAA >60 01/07/2016 1330       STUDIES: Chest 2 View  01/07/2016  CLINICAL DATA:  No chest c/o. Nonsmoker. Pre op for port-a-cath insertion. Hx of breast ca EXAM: CHEST  2 VIEW COMPARISON:  Chest CT, 12/16/2015.  Chest radiograph, 08/15/2012. FINDINGS: Mild enlargement of cardiac silhouette. No mediastinal or hilar masses or evidence of adenopathy. Lungs are clear.  No pleural effusion or pneumothorax. Bony thorax demineralized intact. No convincing osteoblastic or osteolytic lesion. There changes from right mastectomy. Soft tissue fullness along the left axilla is consistent known adenopathy. IMPRESSION: No  acute cardiopulmonary disease. Electronically Signed   By: Lajean Manes M.D.   On: 01/07/2016 15:21   Ct Chest W Contrast  12/16/2015  CLINICAL DATA:  Restaging of breast cancer, diagnosed in 2014. Right-sided mastectomy and lymph node removal.  Left breast and arm swelling for 2-3 weeks. EXAM: CT CHEST WITH CONTRAST TECHNIQUE: Multidetector CT imaging of the chest was performed during intravenous contrast administration. CONTRAST:  22m ISOVUE-300 IOPAMIDOL (ISOVUE-300) INJECTION 61% COMPARISON:  06/19/2014.  Clinic note of 06/13/2015 FINDINGS: Mediastinum/Nodes: No supraclavicular adenopathy. Mild cardiomegaly. No pericardial effusion. No central pulmonary embolism, on this non-dedicated study. No middle mediastinal or hilar adenopathy. No internal mammary adenopathy. Lungs/Pleura: Trace left pleural thickening is similar. Minimal motion degradation throughout. Anterior right upper lobe radiation fibrosis. Upper abdomen: Cholecystectomy. Normal imaged portions of the liver, spleen, stomach, pancreas, right adrenal gland, and left kidney. Mild left adrenal thickening is similar. Upper pole right renal scarring. Musculoskeletal: Right mastectomy and axillary node dissection. Significant increase an left axillary adenopathy. Increased number and size of left axillary nodes. Index node measures 2.3 x 2.5 cm on image 30/series 2. compare 1.9 x 2.7 cm on the prior. More cephalad left axillary node measures 2.4 cm on image 24/series 2 versus 1.6 cm on the prior. Left breast soft tissue fullness and skin thickening are new. Left subpectoral node measures 8 mm on image 13/series 2 versus 7 mm on the prior. IMPRESSION: 1. Progressive left axillary and subpectoral adenopathy, as evidenced by increased size and number of nodes. New soft tissue fullness throughout the left breast with skin thickening. Constellation of findings are suspicious for progressive metastatic disease and possible inflammatory type left sided breast  cancer. Alternatively, left breast edema and skin thickening could be related to venous compression from axillary adenopathy. Mammographic correlation suggested. 2. Status post right mastectomy and axillary node dissection. Electronically Signed   By: KAbigail MiyamotoM.D.   On: 12/16/2015 17:34   UKoreaExtrem Up Left Ltd  12/26/2015  CLINICAL DATA:  70year old female with history of remote right breast cancer status post right mastectomy. The patient had a a CT demonstrating multiple abnormal left axillary lymph nodes. The patient has a red inflamed tender left breast and significant left upper extremity swelling. EXAM: ULTRASOUND LEFT UPPER EXTREMITY LIMITED TECHNIQUE: Ultrasound examination of the upper extremity soft tissues was performed in the area of clinical concern. COMPARISON:  Correlation made with CT from 12/16/2015. FINDINGS: There are multiple markedly enlarged masses in the left axilla. One of these masses measures 4.1 cm in long axis with a cortex of 1.5 cm. IMPRESSION: Multiple markedly abnormal left axillary lymph nodes. The patient has refused mammogram due to pain. As discussed previously with Dr. MJana Hakim we will proceed with a left axillary biopsy today for tissue diagnosis. Electronically Signed   By: MAmmie FerrierM.D.   On: 12/26/2015 16:27   Dg Chest Port 1 View  01/09/2016  CLINICAL DATA:  Status post Port-A-Cath placement. History of breast cancer. EXAM: PORTABLE CHEST 1 VIEW COMPARISON:  Radiograph of January 07, 2016. FINDINGS: Stable cardiomediastinal silhouette. Interval placement of right subclavian Port-A-Cath with distal tip in expected position of the SVC. No pneumothorax or pleural effusion is noted. Right axillary surgical clips are noted. Mild left basilar scarring or subsegmental atelectasis is noted. Bony thorax appears intact. IMPRESSION: Interval placement of right subclavian Port-A-Cath with distal tip in expected position of SVC. No pneumothorax is noted. Electronically  Signed   By: JMarijo Conception M.D.   On: 01/09/2016 08:25   Dg C-arm 1-60 Min-no Report  01/09/2016  CLINICAL DATA: surgery C-ARM 1-60 MINUTES Fluoroscopy was utilized by the requesting physician.  No radiographic interpretation.   UKoreaLt Breast Bx W Loc Dev 1st Lesion Img  Bx Spec US Guide  12/27/2015  ADDENDUM REPORT: 12/27/2015 11:21 ADDENDUM: Pathology revealed METASTATIC CARCINOMA of the Left axillary lymph node, the findings are consistent with metastatic breast carcinoma. This was found to be concordant by Dr. Ammie Ferrier. Pathology results were discussed with the patient by telephone. The patient reported doing well after the biopsy with tenderness at the site. Post biopsy instructions and care were reviewed and questions were answered. The patient was encouraged to call [facility] for any additional concerns. Surgical consultation has been arranged with Dr. Fanny Powell at [facility] on January 09, 2016. Pathology results reported by Terie Purser, RN on 12/27/2015. Electronically Signed   By: Ammie Ferrier M.D.   On: 12/27/2015 11:21  12/27/2015  CLINICAL DATA:  70 year old female presenting for ultrasound-guided biopsy of 1 of the multiple abnormal left axillary lymph nodes previously identified on CT. EXAM: ULTRASOUND GUIDED LEFT BREAST CORE NEEDLE BIOPSY COMPARISON:  Previous exam(s). FINDINGS: I met with the patient and we discussed the procedure of ultrasound-guided biopsy, including benefits and alternatives. We discussed the high likelihood of a successful procedure. We discussed the risks of the procedure, including infection, bleeding, tissue injury, clip migration, and inadequate sampling. Informed written consent was given. The usual time-out protocol was performed immediately prior to the procedure. Using sterile technique and 1% Lidocaine as local anesthetic, under direct ultrasound visualization, a 14 gauge spring-loaded device was used to  perform biopsy of a left axillary mass using an inferior approach. At the conclusion of the procedure a HydroMARK tissue marker clip was deployed into the biopsy cavity. IMPRESSION: Ultrasound guided biopsy of abnormal left axillary lymph node. No apparent complications. Electronically Signed: By: Ammie Ferrier M.D. On: 12/26/2015 16:17    ASSESSMENT: 70 y.o. [place] woman   (1)  s/p Right breast upper outer quadrant and axillary node biopsy 03/08/2012 for a clinical T4, N1-2', stage IIIB invasive ductal carcinoma, grade 3, triple negative, with an MIB-1 of 93%.  (2) definitive staging and treatment delayed as patient canceled staging studies and tried alternative treatments; with eventual progression  (3) neoadjuvant chemotherapy started 08/23/12  (a) received two cycles of neoadjuvant cyclophosphamide, docetaxel, doxorubicin ["TAC"] at standard doses with neulasta support; refused neulasta with cycles 2 and 4; tolerated treatment poorly  (b) docetaxel was held for final 4 cycles; completed cycle 6 neoadjuvant chemotherapy 12/27/2012    (4) status post right modified radical mastectomy 03/13/2013 showing no residual carcinoma in the breast, but nine of 9 axillary lymph nodes sampled involved with macro metastases (ypTX, ypN2, stage IIIA)-- repeat prognostic panel again triple negative  (5) radiation therapy completed 05/31/2013  (6) patient opted against reconstruction  METASTATIC DISEASE: 7) left axillary adenopathy noted on mammography June 2015, confirmed on CT scan November 2015  (a) patient refused further workup or treatment on multiple visits (see notes)  (8) chest CT 12/16/2015 shows progressive left axillary and subpectoral adenopathy, left upper extremity lymphedema, left breast soft tissue fullness and skin thickening  (a) left axillary lymph node biopsy 12/26/2015 confirms metastatic carcinoma, triple negative  (9) to start Cytovene/carboplatin day 1 and day 8 of each 21  cycle, first dose 01/13/2016   PLAN: I gave her a copy of her final pathology report, which includes the prognostic panel. Unfortunately this tumor is still triple negative.  I then reviewed her supportive medicines and anti-emetics. All the prescriptions have been sent to her pharmacy except for the lorazepam, which I gave her in and,  since it cannot be prescribed. I gave her a copy of the roadmap on how to take these medications and we went over that in detail so she will understand it correctly.  Her elevated blood pressure is very much a function of anxiety and when she is not in a stressful situation her blood pressure is in the normal range on her current medication. JanicePharr is accordingly being careful before adding new antihypertensives and he is having the patient's husband Janice Powell check her blood pressure twice a day at home to get a better baseline.   Her pain is well-controlled on her Vicodin, and she has a good bowel prophylaxis regimen.    the plan then is to start chemotherapy Monday, 01/13/2016. She will see me the next week and we will do any fine tuning needed at that point. She understands I anticipate approximately 6 months of chemotherapy, but we will obtain a chest CT scan after the first 3 months to measure progress. Of course she should notice decreased left upper extremity swelling, decreased left breast erythema, and decreased pain while before then.  She knows to call for any problems that may develop before her next visit here.      Chauncey Cruel, MD    01/10/2016

## 2016-01-13 ENCOUNTER — Other Ambulatory Visit: Payer: Self-pay

## 2016-01-13 ENCOUNTER — Other Ambulatory Visit: Payer: Self-pay | Admitting: Oncology

## 2016-01-13 ENCOUNTER — Other Ambulatory Visit: Payer: Medicare Other

## 2016-01-13 ENCOUNTER — Ambulatory Visit (HOSPITAL_BASED_OUTPATIENT_CLINIC_OR_DEPARTMENT_OTHER): Payer: Medicare Other

## 2016-01-13 VITALS — BP 183/66 | HR 64 | Temp 98.2°F | Resp 18

## 2016-01-13 DIAGNOSIS — Z5111 Encounter for antineoplastic chemotherapy: Secondary | ICD-10-CM | POA: Diagnosis present

## 2016-01-13 DIAGNOSIS — C50411 Malignant neoplasm of upper-outer quadrant of right female breast: Secondary | ICD-10-CM | POA: Diagnosis not present

## 2016-01-13 DIAGNOSIS — N6320 Unspecified lump in the left breast, unspecified quadrant: Secondary | ICD-10-CM

## 2016-01-13 DIAGNOSIS — I10 Essential (primary) hypertension: Secondary | ICD-10-CM

## 2016-01-13 MED ORDER — SODIUM CHLORIDE 0.9 % IV SOLN
1400.0000 mg | Freq: Once | INTRAVENOUS | Status: AC
  Administered 2016-01-13: 1400 mg via INTRAVENOUS
  Filled 2016-01-13: qty 36.82

## 2016-01-13 MED ORDER — FUROSEMIDE 10 MG/ML IJ SOLN
20.0000 mg | Freq: Once | INTRAMUSCULAR | Status: AC
  Administered 2016-01-13: 20 mg via INTRAVENOUS

## 2016-01-13 MED ORDER — SODIUM CHLORIDE 0.9 % IV SOLN
10.0000 mg | Freq: Once | INTRAVENOUS | Status: AC
  Administered 2016-01-13: 10 mg via INTRAVENOUS
  Filled 2016-01-13: qty 1

## 2016-01-13 MED ORDER — FUROSEMIDE 10 MG/ML IJ SOLN
INTRAMUSCULAR | Status: AC
  Filled 2016-01-13: qty 2

## 2016-01-13 MED ORDER — HEPARIN SOD (PORK) LOCK FLUSH 100 UNIT/ML IV SOLN
500.0000 [IU] | Freq: Once | INTRAVENOUS | Status: AC | PRN
  Administered 2016-01-13: 500 [IU]
  Filled 2016-01-13: qty 5

## 2016-01-13 MED ORDER — PALONOSETRON HCL INJECTION 0.25 MG/5ML
INTRAVENOUS | Status: AC
  Filled 2016-01-13: qty 5

## 2016-01-13 MED ORDER — SODIUM CHLORIDE 0.9 % IV SOLN
Freq: Once | INTRAVENOUS | Status: AC
  Administered 2016-01-13: 12:00:00 via INTRAVENOUS

## 2016-01-13 MED ORDER — SODIUM CHLORIDE 0.9% FLUSH
10.0000 mL | INTRAVENOUS | Status: DC | PRN
  Administered 2016-01-13: 10 mL
  Filled 2016-01-13: qty 10

## 2016-01-13 MED ORDER — FUROSEMIDE 10 MG/ML IJ SOLN
20.0000 mg | Freq: Once | INTRAMUSCULAR | Status: DC
Start: 2016-01-13 — End: 2016-01-13

## 2016-01-13 MED ORDER — SODIUM CHLORIDE 0.9 % IV SOLN
160.4000 mg | Freq: Once | INTRAVENOUS | Status: AC
  Administered 2016-01-13: 160 mg via INTRAVENOUS
  Filled 2016-01-13: qty 16

## 2016-01-13 MED ORDER — PALONOSETRON HCL INJECTION 0.25 MG/5ML
0.2500 mg | Freq: Once | INTRAVENOUS | Status: AC
  Administered 2016-01-13: 0.25 mg via INTRAVENOUS

## 2016-01-13 NOTE — Patient Instructions (Addendum)
[facility] Discharge Instructions for Patients Receiving Chemotherapy  Today you received the following chemotherapy agents: Gemzar and Carboplatin.  To help prevent nausea and vomiting after your treatment, we encourage you to take your nausea medication as directed.   If you develop nausea and vomiting that is not controlled by your nausea medication, call the clinic.   BELOW ARE SYMPTOMS THAT SHOULD BE REPORTED IMMEDIATELY:  *FEVER GREATER THAN 100.5 F  *CHILLS WITH OR WITHOUT FEVER  NAUSEA AND VOMITING THAT IS NOT CONTROLLED WITH YOUR NAUSEA MEDICATION  *UNUSUAL SHORTNESS OF BREATH  *UNUSUAL BRUISING OR BLEEDING  TENDERNESS IN MOUTH AND THROAT WITH OR WITHOUT PRESENCE OF ULCERS  *URINARY PROBLEMS  *BOWEL PROBLEMS  UNUSUAL RASH Items with * indicate a potential emergency and should be followed up as soon as possible.  Feel free to call the clinic you have any questions or concerns. The clinic phone number is [phone].  Please show the [place] at check-in to the Emergency Department and triage nurse.  Gemcitabine injection What is this medicine? GEMCITABINE (jem SIT a been) is a chemotherapy drug. This medicine is used to treat many types of cancer like breast cancer, lung cancer, pancreatic cancer, and ovarian cancer. This medicine may be used for other purposes; ask your health care provider or pharmacist if you have questions. What should I tell my health care provider before I take this medicine? They need to know if you have any of these conditions: -blood disorders -infection -kidney disease -liver disease -recent or ongoing radiation therapy -an unusual or allergic reaction to gemcitabine, other chemotherapy, other medicines, foods, dyes, or preservatives -pregnant or trying to get pregnant -breast-feeding How should I use this medicine? This drug is given as an infusion into a vein. It is administered in a hospital or clinic by  a specially trained health care professional. Talk to your pediatrician regarding the use of this medicine in children. Special care may be needed. Overdosage: If you think you have taken too much of this medicine contact a poison control center or emergency room at once. NOTE: This medicine is only for you. Do not share this medicine with others. What if I miss a dose? It is important not to miss your dose. Call your doctor or health care professional if you are unable to keep an appointment. What may interact with this medicine? -medicines to increase blood counts like filgrastim, pegfilgrastim, sargramostim -some other chemotherapy drugs like cisplatin -vaccines Talk to your doctor or health care professional before taking any of these medicines: -acetaminophen -aspirin -ibuprofen -ketoprofen -naproxen This list may not describe all possible interactions. Give your health care provider a list of all the medicines, herbs, non-prescription drugs, or dietary supplements you use. Also tell them if you smoke, drink alcohol, or use illegal drugs. Some items may interact with your medicine. What should I watch for while using this medicine? Visit your doctor for checks on your progress. This drug may make you feel generally unwell. This is not uncommon, as chemotherapy can affect healthy cells as well as cancer cells. Report any side effects. Continue your course of treatment even though you feel ill unless your doctor tells you to stop. In some cases, you may be given additional medicines to help with side effects. Follow all directions for their use. Call your doctor or health care professional for advice if you get a fever, chills or sore throat, or other symptoms of a cold or flu. Do not  treat yourself. This drug decreases your body's ability to fight infections. Try to avoid being around people who are sick. This medicine may increase your risk to bruise or bleed. Call your doctor or health  care professional if you notice any unusual bleeding. Be careful brushing and flossing your teeth or using a toothpick because you may get an infection or bleed more easily. If you have any dental work done, tell your dentist you are receiving this medicine. Avoid taking products that contain aspirin, acetaminophen, ibuprofen, naproxen, or ketoprofen unless instructed by your doctor. These medicines may hide a fever. Women should inform their doctor if they wish to become pregnant or think they might be pregnant. There is a potential for serious side effects to an unborn child. Talk to your health care professional or pharmacist for more information. Do not breast-feed an infant while taking this medicine. What side effects may I notice from receiving this medicine? Side effects that you should report to your doctor or health care professional as soon as possible: -allergic reactions like skin rash, itching or hives, swelling of the face, lips, or tongue -low blood counts - this medicine may decrease the number of white blood cells, red blood cells and platelets. You may be at increased risk for infections and bleeding. -signs of infection - fever or chills, cough, sore throat, pain or difficulty passing urine -signs of decreased platelets or bleeding - bruising, pinpoint red spots on the skin, black, tarry stools, blood in the urine -signs of decreased red blood cells - unusually weak or tired, fainting spells, lightheadedness -breathing problems -chest pain -mouth sores -nausea and vomiting -pain, swelling, redness at site where injected -pain, tingling, numbness in the hands or feet -stomach pain -swelling of ankles, feet, hands -unusual bleeding Side effects that usually do not require medical attention (report to your doctor or health care professional if they continue or are bothersome): -constipation -diarrhea -hair loss -loss of appetite -stomach upset This list may not describe all  possible side effects. Call your doctor for medical advice about side effects. You may report side effects to FDA at 1-800-FDA-1088. Where should I keep my medicine? This drug is given in a hospital or clinic and will not be stored at home. NOTE: This sheet is a summary. It may not cover all possible information. If you have questions about this medicine, talk to your doctor, pharmacist, or health care provider.    2016, [employer]. (2007-11-22 18:45:54)  Carboplatin injection What is this medicine? CARBOPLATIN (KAR boe pla tin) is a chemotherapy drug. It targets fast dividing cells, like cancer cells, and causes these cells to die. This medicine is used to treat ovarian cancer and many other cancers. This medicine may be used for other purposes; ask your health care provider or pharmacist if you have questions. What should I tell my health care provider before I take this medicine? They need to know if you have any of these conditions: -blood disorders -hearing problems -kidney disease -recent or ongoing radiation therapy -an unusual or allergic reaction to carboplatin, cisplatin, other chemotherapy, other medicines, foods, dyes, or preservatives -pregnant or trying to get pregnant -breast-feeding How should I use this medicine? This drug is usually given as an infusion into a vein. It is administered in a hospital or clinic by a specially trained health care professional. Talk to your pediatrician regarding the use of this medicine in children. Special care may be needed. Overdosage: If you think you have taken too  much of this medicine contact a poison control center or emergency room at once. NOTE: This medicine is only for you. Do not share this medicine with others. What if I miss a dose? It is important not to miss a dose. Call your doctor or health care professional if you are unable to keep an appointment. What may interact with this medicine? -medicines for  seizures -medicines to increase blood counts like filgrastim, pegfilgrastim, sargramostim -some antibiotics like amikacin, gentamicin, neomycin, streptomycin, tobramycin -vaccines Talk to your doctor or health care professional before taking any of these medicines: -acetaminophen -aspirin -ibuprofen -ketoprofen -naproxen This list may not describe all possible interactions. Give your health care provider a list of all the medicines, herbs, non-prescription drugs, or dietary supplements you use. Also tell them if you smoke, drink alcohol, or use illegal drugs. Some items may interact with your medicine. What should I watch for while using this medicine? Your condition will be monitored carefully while you are receiving this medicine. You will need important blood work done while you are taking this medicine. This drug may make you feel generally unwell. This is not uncommon, as chemotherapy can affect healthy cells as well as cancer cells. Report any side effects. Continue your course of treatment even though you feel ill unless your doctor tells you to stop. In some cases, you may be given additional medicines to help with side effects. Follow all directions for their use. Call your doctor or health care professional for advice if you get a fever, chills or sore throat, or other symptoms of a cold or flu. Do not treat yourself. This drug decreases your body's ability to fight infections. Try to avoid being around people who are sick. This medicine may increase your risk to bruise or bleed. Call your doctor or health care professional if you notice any unusual bleeding. Be careful brushing and flossing your teeth or using a toothpick because you may get an infection or bleed more easily. If you have any dental work done, tell your dentist you are receiving this medicine. Avoid taking products that contain aspirin, acetaminophen, ibuprofen, naproxen, or ketoprofen unless instructed by your doctor.  These medicines may hide a fever. Do not become pregnant while taking this medicine. Women should inform their doctor if they wish to become pregnant or think they might be pregnant. There is a potential for serious side effects to an unborn child. Talk to your health care professional or pharmacist for more information. Do not breast-feed an infant while taking this medicine. What side effects may I notice from receiving this medicine? Side effects that you should report to your doctor or health care professional as soon as possible: -allergic reactions like skin rash, itching or hives, swelling of the face, lips, or tongue -signs of infection - fever or chills, cough, sore throat, pain or difficulty passing urine -signs of decreased platelets or bleeding - bruising, pinpoint red spots on the skin, black, tarry stools, nosebleeds -signs of decreased red blood cells - unusually weak or tired, fainting spells, lightheadedness -breathing problems -changes in hearing -changes in vision -chest pain -high blood pressure -low blood counts - This drug may decrease the number of white blood cells, red blood cells and platelets. You may be at increased risk for infections and bleeding. -nausea and vomiting -pain, swelling, redness or irritation at the injection site -pain, tingling, numbness in the hands or feet -problems with balance, talking, walking -trouble passing urine or change in the amount of  urine Side effects that usually do not require medical attention (report to your doctor or health care professional if they continue or are bothersome): -hair loss -loss of appetite -metallic taste in the mouth or changes in taste This list may not describe all possible side effects. Call your doctor for medical advice about side effects. You may report side effects to FDA at 1-800-FDA-1088. Where should I keep my medicine? This drug is given in a hospital or clinic and will not be stored at  home. NOTE: This sheet is a summary. It may not cover all possible information. If you have questions about this medicine, talk to your doctor, pharmacist, or health care provider.    2016, [employer]. (2007-10-18 14:38:05)

## 2016-01-13 NOTE — Progress Notes (Signed)
Dr. Jana Hakim notified of pt's BP of 213/70.  Pt reports she took both BP medications this morning.  Per MD okay to proceed with treatment, will give lasix after carboplatin per MD order.

## 2016-01-13 NOTE — Progress Notes (Signed)
OK to treat with BP 213/70 per Dr. Jana Hakim.  Pt to receive 10 IV lasix after [place].

## 2016-01-14 ENCOUNTER — Other Ambulatory Visit: Payer: Self-pay | Admitting: *Deleted

## 2016-01-14 ENCOUNTER — Encounter: Payer: Medicare Other | Admitting: Physical Therapy

## 2016-01-14 DIAGNOSIS — C50919 Malignant neoplasm of unspecified site of unspecified female breast: Secondary | ICD-10-CM

## 2016-01-15 ENCOUNTER — Other Ambulatory Visit: Payer: Self-pay | Admitting: *Deleted

## 2016-01-15 ENCOUNTER — Other Ambulatory Visit (HOSPITAL_BASED_OUTPATIENT_CLINIC_OR_DEPARTMENT_OTHER): Payer: Medicare Other

## 2016-01-15 DIAGNOSIS — C50919 Malignant neoplasm of unspecified site of unspecified female breast: Secondary | ICD-10-CM

## 2016-01-15 LAB — CBC WITH DIFFERENTIAL/PLATELET
BASO%: 0.3 % (ref 0.0–2.0)
Basophils Absolute: 0 10*3/uL (ref 0.0–0.1)
EOS%: 0.4 % (ref 0.0–7.0)
Eosinophils Absolute: 0 10*3/uL (ref 0.0–0.5)
HCT: 38.3 % (ref 34.8–46.6)
HEMOGLOBIN: 12.7 g/dL (ref 11.6–15.9)
LYMPH%: 17.6 % (ref 14.0–49.7)
MCH: 32.7 pg (ref 25.1–34.0)
MCHC: 33.3 g/dL (ref 31.5–36.0)
MCV: 98.2 fL (ref 79.5–101.0)
MONO#: 0.2 10*3/uL (ref 0.1–0.9)
MONO%: 3.5 % (ref 0.0–14.0)
NEUT%: 78.2 % — ABNORMAL HIGH (ref 38.4–76.8)
NEUTROS ABS: 5 10*3/uL (ref 1.5–6.5)
Platelets: 239 10*3/uL (ref 145–400)
RBC: 3.9 10*6/uL (ref 3.70–5.45)
RDW: 14.3 % (ref 11.2–14.5)
WBC: 6.4 10*3/uL (ref 3.9–10.3)
lymph#: 1.1 10*3/uL (ref 0.9–3.3)

## 2016-01-15 LAB — COMPREHENSIVE METABOLIC PANEL
ALBUMIN: 3.4 g/dL — AB (ref 3.5–5.0)
ALK PHOS: 40 U/L (ref 40–150)
ALT: 22 U/L (ref 0–55)
AST: 23 U/L (ref 5–34)
Anion Gap: 7 mEq/L (ref 3–11)
BILIRUBIN TOTAL: 1.1 mg/dL (ref 0.20–1.20)
BUN: 16 mg/dL (ref 7.0–26.0)
CO2: 30 mEq/L — ABNORMAL HIGH (ref 22–29)
Calcium: 8.8 mg/dL (ref 8.4–10.4)
Chloride: 102 mEq/L (ref 98–109)
Creatinine: 0.8 mg/dL (ref 0.6–1.1)
EGFR: 82 mL/min/{1.73_m2} — ABNORMAL LOW (ref >90)
GLUCOSE: 130 mg/dL (ref 70–140)
Potassium: 3.3 mEq/L — ABNORMAL LOW (ref 3.5–5.1)
SODIUM: 140 meq/L (ref 136–145)
TOTAL PROTEIN: 6.8 g/dL (ref 6.4–8.3)

## 2016-01-15 MED ORDER — HYDROCODONE-ACETAMINOPHEN 5-325 MG PO TABS: 1.0000 | ORAL_TABLET | Freq: Four times a day (QID) | ORAL | Status: DC | PRN

## 2016-01-17 ENCOUNTER — Other Ambulatory Visit: Payer: Self-pay | Admitting: *Deleted

## 2016-01-17 DIAGNOSIS — C50411 Malignant neoplasm of upper-outer quadrant of right female breast: Secondary | ICD-10-CM

## 2016-01-17 DIAGNOSIS — C779 Secondary and unspecified malignant neoplasm of lymph node, unspecified: Secondary | ICD-10-CM

## 2016-01-17 DIAGNOSIS — C50912 Malignant neoplasm of unspecified site of left female breast: Secondary | ICD-10-CM

## 2016-01-18 ENCOUNTER — Other Ambulatory Visit: Payer: Self-pay | Admitting: Oncology

## 2016-01-19 ENCOUNTER — Other Ambulatory Visit: Payer: Self-pay | Admitting: Oncology

## 2016-01-20 ENCOUNTER — Other Ambulatory Visit (HOSPITAL_BASED_OUTPATIENT_CLINIC_OR_DEPARTMENT_OTHER): Payer: Medicare Other

## 2016-01-20 ENCOUNTER — Ambulatory Visit (HOSPITAL_BASED_OUTPATIENT_CLINIC_OR_DEPARTMENT_OTHER): Payer: Medicare Other

## 2016-01-20 VITALS — BP 198/75 | HR 70 | Temp 98.6°F | Resp 16

## 2016-01-20 DIAGNOSIS — C50912 Malignant neoplasm of unspecified site of left female breast: Secondary | ICD-10-CM

## 2016-01-20 DIAGNOSIS — C50411 Malignant neoplasm of upper-outer quadrant of right female breast: Secondary | ICD-10-CM

## 2016-01-20 DIAGNOSIS — Z5111 Encounter for antineoplastic chemotherapy: Secondary | ICD-10-CM | POA: Diagnosis present

## 2016-01-20 DIAGNOSIS — N6320 Unspecified lump in the left breast, unspecified quadrant: Secondary | ICD-10-CM

## 2016-01-20 DIAGNOSIS — C779 Secondary and unspecified malignant neoplasm of lymph node, unspecified: Secondary | ICD-10-CM

## 2016-01-20 LAB — CBC WITH DIFFERENTIAL/PLATELET
BASO%: 0.6 % (ref 0.0–2.0)
Basophils Absolute: 0 10*3/uL (ref 0.0–0.1)
EOS%: 0.3 % (ref 0.0–7.0)
Eosinophils Absolute: 0 10*3/uL (ref 0.0–0.5)
HEMATOCRIT: 37.3 % (ref 34.8–46.6)
HEMOGLOBIN: 12.4 g/dL (ref 11.6–15.9)
LYMPH#: 1.3 10*3/uL (ref 0.9–3.3)
LYMPH%: 41.7 % (ref 14.0–49.7)
MCH: 32.3 pg (ref 25.1–34.0)
MCHC: 33.2 g/dL (ref 31.5–36.0)
MCV: 97.1 fL (ref 79.5–101.0)
MONO#: 0.1 10*3/uL (ref 0.1–0.9)
MONO%: 4.5 % (ref 0.0–14.0)
NEUT%: 52.9 % (ref 38.4–76.8)
NEUTROS ABS: 1.7 10*3/uL (ref 1.5–6.5)
PLATELETS: 131 10*3/uL — AB (ref 145–400)
RBC: 3.84 10*6/uL (ref 3.70–5.45)
RDW: 13.5 % (ref 11.2–14.5)
WBC: 3.1 10*3/uL — ABNORMAL LOW (ref 3.9–10.3)

## 2016-01-20 LAB — COMPREHENSIVE METABOLIC PANEL
ALBUMIN: 3.6 g/dL (ref 3.5–5.0)
ALK PHOS: 45 U/L (ref 40–150)
ALT: 57 U/L — AB (ref 0–55)
ANION GAP: 9 meq/L (ref 3–11)
AST: 36 U/L — AB (ref 5–34)
BILIRUBIN TOTAL: 0.55 mg/dL (ref 0.20–1.20)
BUN: 9.6 mg/dL (ref 7.0–26.0)
CALCIUM: 9.1 mg/dL (ref 8.4–10.4)
CHLORIDE: 102 meq/L (ref 98–109)
CO2: 28 mEq/L (ref 22–29)
CREATININE: 0.8 mg/dL (ref 0.6–1.1)
EGFR: 82 mL/min/{1.73_m2} — ABNORMAL LOW (ref >90)
Glucose: 118 mg/dl (ref 70–140)
Potassium: 3.4 mEq/L — ABNORMAL LOW (ref 3.5–5.1)
Sodium: 139 mEq/L (ref 136–145)
TOTAL PROTEIN: 7.3 g/dL (ref 6.4–8.3)

## 2016-01-20 MED ORDER — SODIUM CHLORIDE 0.9 % IV SOLN
1400.0000 mg | Freq: Once | INTRAVENOUS | Status: AC
  Administered 2016-01-20: 1400 mg via INTRAVENOUS
  Filled 2016-01-20: qty 36.82

## 2016-01-20 MED ORDER — PALONOSETRON HCL INJECTION 0.25 MG/5ML
0.2500 mg | Freq: Once | INTRAVENOUS | Status: AC
  Administered 2016-01-20: 0.25 mg via INTRAVENOUS

## 2016-01-20 MED ORDER — HEPARIN SOD (PORK) LOCK FLUSH 100 UNIT/ML IV SOLN
500.0000 [IU] | Freq: Once | INTRAVENOUS | Status: AC | PRN
  Administered 2016-01-20: 500 [IU]
  Filled 2016-01-20: qty 5

## 2016-01-20 MED ORDER — DEXAMETHASONE SODIUM PHOSPHATE 100 MG/10ML IJ SOLN
10.0000 mg | Freq: Once | INTRAMUSCULAR | Status: AC
  Administered 2016-01-20: 10 mg via INTRAVENOUS
  Filled 2016-01-20: qty 1

## 2016-01-20 MED ORDER — SODIUM CHLORIDE 0.9 % IV SOLN
Freq: Once | INTRAVENOUS | Status: AC
  Administered 2016-01-20: 12:00:00 via INTRAVENOUS

## 2016-01-20 MED ORDER — PALONOSETRON HCL INJECTION 0.25 MG/5ML
INTRAVENOUS | Status: AC
  Filled 2016-01-20: qty 5

## 2016-01-20 MED ORDER — SODIUM CHLORIDE 0.9 % IV SOLN
160.4000 mg | Freq: Once | INTRAVENOUS | Status: AC
  Administered 2016-01-20: 160 mg via INTRAVENOUS
  Filled 2016-01-20: qty 16

## 2016-01-20 MED ORDER — SODIUM CHLORIDE 0.9% FLUSH
10.0000 mL | INTRAVENOUS | Status: DC | PRN
  Administered 2016-01-20: 10 mL
  Filled 2016-01-20: qty 10

## 2016-01-20 NOTE — Patient Instructions (Signed)
[facility] Discharge Instructions for Patients Receiving Chemotherapy  Today you received the following chemotherapy agents :  Gemzar/Carboplatin  To help prevent nausea and vomiting after your treatment, we encourage you to take your nausea medicationIf you develop nausea and vomiting that is not controlled by your nausea medication, call the clinic.   BELOW ARE SYMPTOMS THAT SHOULD BE REPORTED IMMEDIATELY:  *FEVER GREATER THAN 100.5 F  *CHILLS WITH OR WITHOUT FEVER  NAUSEA AND VOMITING THAT IS NOT CONTROLLED WITH YOUR NAUSEA MEDICATION  *UNUSUAL SHORTNESS OF BREATH  *UNUSUAL BRUISING OR BLEEDING  TENDERNESS IN MOUTH AND THROAT WITH OR WITHOUT PRESENCE OF ULCERS  *URINARY PROBLEMS  *BOWEL PROBLEMS  UNUSUAL RASH Items with * indicate a potential emergency and should be followed up as soon as possible.  Feel free to call the clinic you have any questions or concerns. The clinic phone number is [phone].  Please show the CHEMO ALERT CARD at check-in to the Emergency Department and triage nurse.   

## 2016-01-21 NOTE — Progress Notes (Signed)
Patient ID: Janice Powell, female   DOB: 1945/09/17, 70 y.o.   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 More recently her husband twisted her arm to get the mass looked at, and she brought it to Dr. Pennie Banter attention. Diagnostic mammography and right ultrasonography at [facility] 03/02/2012 showed a 6.78 cm irregular mass in the inner aspect of the right breast. There was nipple retraction and erythema around the nipple. The largest lymph node was noted in the right axilla. By ultrasound the large irregular hypoechoic mass was noted in the breast with multiple enlarged axillary lymph nodes. Physical exam confirmed a hard breast with erythema around the right nipple extending inferiorly. The nipple is slightly inverted.  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   Her subsequent history is as detailed below  INTERVAL HISTORY: Janice Powell returns today for follow-up of her left-sided breast cancer. Today is day 10 cycle 1 of carboplatin and gemcitabine which are given days 1 and 8 of each 21 day cycle. Overall she did remarkably well with her first treatment. She has not started to lose her hair yet. She had minimal nausea, no vomiting, no rash, and no problems with her port.  REVIEW OF SYSTEMS: Janice Powell is a feeling of chest wall tightness. She is not short of breath or coughing. Sometimes they have trouble accessing the port. She is feeling a little bit  depressed she says. She has a compression sleeve but does not like to use it. Otherwise a detailed review of systems is stable  PAST MEDICAL HISTORY: Past Medical History  Diagnosis Date  . Hypertension   . Pneumonia     hx  . GERD (gastroesophageal reflux disease)     occ  . Osteopenia   . Breast cancer ([place]) 03/08/13    right breast - Invasive Ductal Carcinoma   . S/P radiation therapy     1) Right Chest Wall and IM nodes / 50 Gy in 25 fractions /2) Right Supraclavicular fossa / 50 Gy in 25 fractions/1) Right Chest Wall and IM nodes / 50 Gy in 25 fractions /2) Right Supraclavicular fossa / 50 Gy in 25 fractions/3) Right Posterior Axillary boost / 6.55 Gy in 25 fractions/4) Right Chest Wall Scar boost / 10 Gy in 5 fractions      . Hx antineoplastic chemotherapy     two cycles of neoadjuvant cyclophosphamide, docetaxel, doxorubicin ["TAC"] at standard doses with neulasta support; refused neulasta with cycles 2 and 4; tolerated treatment poorly (b) docetaxel was held for final 4 cycles; completed cycle 6 neoadjuvant chemotherapy 12/27/2012    . Wears glasses   . Primary malignant neoplasm of left breast with stage 2 nodal metastasis per American Joint Committee on Cancer 7th edition (N2) ([place]) 01/09/2016  GERD osteopenia  PAST SURGICAL HISTORY: Past Surgical History  Procedure Laterality Date  . Appendectomy  1992  . Tubal ligation  1979  . Cholecystectomy  2000  . Portacath placement  08/15/2012    Procedure: INSERTION PORT-A-CATH;  Surgeon: [name], MD;  Location: [place];  Service: General;  Laterality: Left;  Marland Kitchen Mastectomy Right 03/13/2013  . Breast biopsy Right   . Port-a-cath removal  03/13/2013  . Mastectomy modified radical Right 03/13/2013    Procedure: MASTECTOMY MODIFIED RADICAL;  Surgeon: [name], MD;  Location: [place];  Service: General;  Laterality: Right;  . Port-a-cath removal Left 03/13/2013    Procedure: REMOVAL PORT-A-CATH;  Surgeon: [name], MD;   Location: [place];  Service: General;  Laterality: Left;  . Portacath placement N/A 01/09/2016    Procedure: INSERTION PORT-A-CATH;  Surgeon: [name], MD;  Location: [facility];  Service: General;  Laterality: N/A;    FAMILY HISTORY Family History  Problem Relation Age of Onset  . Heart disease Mother    the patient's father died in his sleep at age 89. The patient's mother died at the age of 14 from a myocardial infarction. The patient has one brother and 2 sisters, all surviving. There is no history of breast or ovarian cancer in the family.  GYNECOLOGIC HISTORY:  (Reviewed 12/07/2013) Menarche age 57, first live birth age 89, she is GX P5, menopause age 33. She did not take hormone replacement.  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 Her husband [name] worked in the post office 51 years, and is also now retired. He is a former Company secretary. Son [name] lives in [place] and manages an [employer] store. Daughter Corlis Angelica also lives in [place] and manages the IT Department at [employer]. Son Mahogany Torrance III is a Engineer, materials. Daughter [name] is a Actuary for [employer]. Son [name] is an Actuary. The patient has 8 grandchildren. She attends [employer]   ADVANCED DIRECTIVES: Not in place  HEALTH MAINTENANCE:  (Updated 12/07/2013) Social History  Substance Use Topics  . Smoking status: Never Smoker   . Smokeless tobacco: Never Used  . Alcohol Use: No     Colonoscopy: Not on file  PAP: Not on file  Bone density:  January 2011   Lipid panel:  Not on file/ Dr. Shelia Media  No Known Allergies  Current Outpatient Prescriptions  Medication Sig Dispense Refill  . dexamethasone (DECADRON) 4 MG tablet Take 2 tablets (8 mg total) by mouth daily. Start the day after chemotherapy for 2 days. Take with food. 30 tablet 1  . HYDROcodone-acetaminophen (NORCO) 5-325 MG tablet Take 1-2 tablets by  mouth every 6 (six) hours as needed. 120 tablet 0  . lidocaine-prilocaine (EMLA) cream Apply to affected area once 30 g 3  . lisinopril (PRINIVIL,ZESTRIL) 10 MG tablet TAKE 1 TABLET EVERY DAY 90 tablet 1  . lisinopril (PRINIVIL,ZESTRIL) 10 MG tablet TAKE 1 TABLET EVERY DAY 90 tablet 1  . LORazepam (ATIVAN) 0.5 MG tablet Take 1 tablet (0.5 mg total) by mouth at bedtime as needed (Nausea or vomiting). 30 tablet 0  . metoprolol succinate (TOPROL-XL) 50 MG 24 hr tablet Take 1 tablet (50 mg total) by mouth daily. 30 tablet 0  . metoprolol succinate (TOPROL-XL) 50 MG 24 hr tablet TAKE 1 TABLET BY MOUTH EVERY DAY WITH OR IMMEDIATELY FOLLOWING A MEAL 90 tablet 0  . omega-3 acid ethyl esters (LOVAZA) 1 G capsule Take 1 g by mouth daily.     . potassium chloride (K-DUR) 10 MEQ tablet Take 1 tablet (10 mEq total) by  mouth daily. 60 tablet 5  . prochlorperazine (COMPAZINE) 10 MG tablet Take 1 tablet (10 mg total) by mouth every 6 (six) hours as needed (Nausea or vomiting). 30 tablet 1   No current facility-administered medications for this visit.    OBJECTIVE: Middle-aged [place] American woman  There were no vitals filed for this visit.   There is no weight on file to calculate BMI.    ECOG FS: 1 There were no vitals filed for this visit.  Sclerae unicteric, pupils round and equal Oropharynx clear and moist-- no thrush or other lesions No cervical or supraclavicular adenopathy Lungs no rales or rhonchi Heart regular rate and rhythm Abd soft, nontender, positive bowel sounds MSK no focal spinal tenderness, grade 1 right upper extremity lymphedema Neuro: nonfocal, well oriented, appropriate affect Breasts: Deferred     Photo of left breast 12/17/2015    LAB RESULTS: CBC    Component Value Date/Time   WBC 3.1* 01/20/2016 1112   WBC 6.0 01/07/2016 1330   RBC 3.84 01/20/2016 1112   RBC 4.12 01/07/2016 1330   HGB 12.4 01/20/2016 1112   HGB 13.3 01/07/2016 1330   HCT 37.3 01/20/2016 1112     HCT 40.4 01/07/2016 1330   PLT 131* 01/20/2016 1112   PLT 300 01/07/2016 1330   MCV 97.1 01/20/2016 1112   MCV 98.1 01/07/2016 1330   MCH 32.3 01/20/2016 1112   MCH 32.3 01/07/2016 1330   MCHC 33.2 01/20/2016 1112   MCHC 32.9 01/07/2016 1330   RDW 13.5 01/20/2016 1112   RDW 14.4 01/07/2016 1330   LYMPHSABS 1.3 01/20/2016 1112   LYMPHSABS 1.8 01/07/2016 1330   MONOABS 0.1 01/20/2016 1112   MONOABS 0.6 01/07/2016 1330   EOSABS 0.0 01/20/2016 1112   EOSABS 0.0 01/07/2016 1330   BASOSABS 0.0 01/20/2016 1112   BASOSABS 0.0 01/07/2016 1330    CMP     Component Value Date/Time   NA 139 01/20/2016 1112   NA 139 01/07/2016 1330   K 3.4* 01/20/2016 1112   K 4.1 01/07/2016 1330   CL 102 01/07/2016 1330   CL 107 01/11/2013 0853   CO2 28 01/20/2016 1112   CO2 30 01/07/2016 1330   GLUCOSE 118 01/20/2016 1112   GLUCOSE 99 01/07/2016 1330   GLUCOSE 123* 01/11/2013 0853   BUN 9.6 01/20/2016 1112   BUN 11 01/07/2016 1330   CREATININE 0.8 01/20/2016 1112   CREATININE 0.80 01/07/2016 1330   CALCIUM 9.1 01/20/2016 1112   CALCIUM 9.0 01/07/2016 1330   PROT 7.3 01/20/2016 1112   PROT 7.5 01/07/2016 1330   ALBUMIN 3.6 01/20/2016 1112   ALBUMIN 4.1 01/07/2016 1330   AST 36* 01/20/2016 1112   AST 32 01/07/2016 1330   ALT 57* 01/20/2016 1112   ALT 17 01/07/2016 1330   ALKPHOS 45 01/20/2016 1112   ALKPHOS 41 01/07/2016 1330   BILITOT 0.55 01/20/2016 1112   BILITOT 1.4* 01/07/2016 1330   GFRNONAA >60 01/07/2016 1330   GFRAA >60 01/07/2016 1330       STUDIES: Chest 2 View  01/07/2016  CLINICAL DATA:  No chest c/o. Nonsmoker. Pre op for port-a-cath insertion. Hx of breast ca EXAM: CHEST  2 VIEW COMPARISON:  Chest CT, 12/16/2015.  Chest radiograph, 08/15/2012. FINDINGS: Mild enlargement of cardiac silhouette. No mediastinal or hilar masses or evidence of adenopathy. Lungs are clear.  No pleural effusion or pneumothorax. Bony thorax demineralized intact. No convincing osteoblastic or  osteolytic lesion. There changes from right mastectomy. Soft tissue  fullness along the left axilla is consistent known adenopathy. IMPRESSION: No acute cardiopulmonary disease. Electronically Signed   By: Lajean Manes M.D.   On: 01/07/2016 15:21   [place] Extrem Up Left Ltd  12/26/2015  CLINICAL DATA:  70 year old female with history of remote right breast cancer status post right mastectomy. The patient had a a CT demonstrating multiple abnormal left axillary lymph nodes. The patient has a red inflamed tender left breast and significant left upper extremity swelling. EXAM: ULTRASOUND LEFT UPPER EXTREMITY LIMITED TECHNIQUE: Ultrasound examination of the upper extremity soft tissues was performed in the area of clinical concern. COMPARISON:  Correlation made with CT from 12/16/2015. FINDINGS: There are multiple markedly enlarged masses in the left axilla. One of these masses measures 4.1 cm in long axis with a cortex of 1.5 cm. IMPRESSION: Multiple markedly abnormal left axillary lymph nodes. The patient has refused mammogram due to pain. As discussed previously with Dr. Jana Hakim, we will proceed with a left axillary biopsy today for tissue diagnosis. Electronically Signed   By: Ammie Ferrier M.D.   On: 12/26/2015 16:27   Dg Chest Port 1 View  01/09/2016  CLINICAL DATA:  Status post Port-A-Cath placement. History of breast cancer. EXAM: PORTABLE CHEST 1 VIEW COMPARISON:  Radiograph of January 07, 2016. FINDINGS: Stable cardiomediastinal silhouette. Interval placement of right subclavian Port-A-Cath with distal tip in expected position of the SVC. No pneumothorax or pleural effusion is noted. Right axillary surgical clips are noted. Mild left basilar scarring or subsegmental atelectasis is noted. Bony thorax appears intact. IMPRESSION: Interval placement of right subclavian Port-A-Cath with distal tip in expected position of SVC. No pneumothorax is noted. Electronically Signed   By: Marijo Conception, M.D.   On:  01/09/2016 08:25   Dg C-arm 1-60 Min-no Report  01/09/2016  CLINICAL DATA: surgery C-ARM 1-60 MINUTES Fluoroscopy was utilized by the requesting physician.  No radiographic interpretation.   [place] Lt Breast Bx W Loc Dev 1st Lesion Img Bx Spec US Guide  12/27/2015  ADDENDUM REPORT: 12/27/2015 11:21 ADDENDUM: Pathology revealed METASTATIC CARCINOMA of the Left axillary lymph node, the findings are consistent with metastatic breast carcinoma. This was found to be concordant by Dr. Ammie Ferrier. Pathology results were discussed with the patient by telephone. The patient reported doing well after the biopsy with tenderness at the site. Post biopsy instructions and care were reviewed and questions were answered. The patient was encouraged to call [facility] for any additional concerns. Surgical consultation has been arranged with Dr. [name] at [facility] on January 09, 2016. Pathology results reported by Terie Purser, RN on 12/27/2015. Electronically Signed   By: Ammie Ferrier M.D.   On: 12/27/2015 11:21  12/27/2015  CLINICAL DATA:  70 year old female presenting for ultrasound-guided biopsy of 1 of the multiple abnormal left axillary lymph nodes previously identified on CT. EXAM: ULTRASOUND GUIDED LEFT BREAST CORE NEEDLE BIOPSY COMPARISON:  Previous exam(s). FINDINGS: I met with the patient and we discussed the procedure of ultrasound-guided biopsy, including benefits and alternatives. We discussed the high likelihood of a successful procedure. We discussed the risks of the procedure, including infection, bleeding, tissue injury, clip migration, and inadequate sampling. Informed written consent was given. The usual time-out protocol was performed immediately prior to the procedure. Using sterile technique and 1% Lidocaine as local anesthetic, under direct ultrasound visualization, a 14 gauge spring-loaded device was used to perform biopsy of a left axillary mass  using an inferior approach.  At the conclusion of the procedure a HydroMARK tissue marker clip was deployed into the biopsy cavity. IMPRESSION: Ultrasound guided biopsy of abnormal left axillary lymph node. No apparent complications. Electronically Signed: By: Ammie Ferrier M.D. On: 12/26/2015 16:17    ASSESSMENT: 70 y.o. [place] woman   (1)  s/p Right breast upper outer quadrant and axillary node biopsy 03/08/2012 for a clinical T4, N1-2', stage IIIB invasive ductal carcinoma, grade 3, triple negative, with an MIB-1 of 93%.  (2) definitive staging and treatment delayed as patient canceled staging studies and tried alternative treatments; with eventual progression  (3) neoadjuvant chemotherapy started 08/23/12  (a) received two cycles of neoadjuvant cyclophosphamide, docetaxel, doxorubicin ["TAC"] at standard doses with neulasta support; refused neulasta with cycles 2 and 4; tolerated treatment poorly  (b) docetaxel was held for final 4 cycles; completed cycle 6 neoadjuvant chemotherapy 12/27/2012    (4) status post right modified radical mastectomy 03/13/2013 showing no residual carcinoma in the breast, but nine of 9 axillary lymph nodes sampled involved with macro metastases (ypTX, ypN2, stage IIIA)-- repeat prognostic panel again triple negative  (5) radiation therapy completed 05/31/2013  (6) patient opted against reconstruction  METASTATIC DISEASE: 7) left axillary adenopathy noted on mammography June 2015, confirmed on CT scan November 2015  (a) patient refused further workup or treatment on multiple visits (see notes)  (8) chest CT 12/16/2015 shows progressive left axillary and subpectoral adenopathy, left upper extremity lymphedema, left breast soft tissue fullness and skin thickening  (a) left axillary lymph node biopsy 12/26/2015 confirms metastatic carcinoma, triple negative  (9) started gemcitabine/carboplatin 01/13/2016, repeated day 1 and day 8 of each 21 cycle    PLAN: I gave her a copy of her final pathology report, which includes the prognostic panel. Unfortunately this tumor is still triple negative.  I then reviewed her supportive medicines and anti-emetics. All the prescriptions have been sent to her pharmacy except for the lorazepam, which I gave her in and, since it cannot be prescribed. I gave her a copy of the roadmap on how to take these medications and we went over that in detail so she will understand it correctly.  Her elevated blood pressure is very much a function of anxiety and when she is not in a stressful situation her blood pressure is in the normal range on her current medication. Dr.Pharr is accordingly being careful before adding new antihypertensives and he is having the patient's husband [name] check her blood pressure twice a day at home to get a better baseline.   Her pain is well-controlled on her Vicodin, and she has a good bowel prophylaxis regimen.    the plan then is to start chemotherapy Monday, 01/13/2016. She will see me the next week and we will do any fine tuning needed at that point. She understands I anticipate approximately 6 months of chemotherapy, but we will obtain a chest CT scan after the first 3 months to measure progress. Of course she should notice decreased left upper extremity swelling, decreased left breast erythema, and decreased pain while before then.  She knows to call for any problems that may develop before her next visit here.      Chauncey Cruel, MD    01/22/2016

## 2016-01-22 ENCOUNTER — Ambulatory Visit: Payer: Medicare Other | Admitting: Oncology

## 2016-01-22 ENCOUNTER — Telehealth: Payer: Self-pay | Admitting: Oncology

## 2016-01-22 DIAGNOSIS — C779 Secondary and unspecified malignant neoplasm of lymph node, unspecified: Secondary | ICD-10-CM

## 2016-01-22 DIAGNOSIS — C50411 Malignant neoplasm of upper-outer quadrant of right female breast: Secondary | ICD-10-CM

## 2016-01-22 DIAGNOSIS — C50912 Malignant neoplasm of unspecified site of left female breast: Secondary | ICD-10-CM

## 2016-01-22 MED ORDER — HYDROCODONE-ACETAMINOPHEN 5-325 MG PO TABS: 1.0000 | ORAL_TABLET | Freq: Four times a day (QID) | ORAL | Status: DC | PRN

## 2016-01-22 NOTE — Telephone Encounter (Signed)
appt made and avs printed °

## 2016-02-03 ENCOUNTER — Ambulatory Visit: Payer: Medicare Other

## 2016-02-03 ENCOUNTER — Other Ambulatory Visit: Payer: Medicare Other

## 2016-02-03 ENCOUNTER — Other Ambulatory Visit: Payer: Self-pay | Admitting: Oncology

## 2016-02-04 ENCOUNTER — Ambulatory Visit: Payer: Medicare Other

## 2016-02-04 ENCOUNTER — Other Ambulatory Visit (HOSPITAL_BASED_OUTPATIENT_CLINIC_OR_DEPARTMENT_OTHER): Payer: Medicare Other

## 2016-02-04 ENCOUNTER — Telehealth: Payer: Self-pay | Admitting: Oncology

## 2016-02-04 ENCOUNTER — Ambulatory Visit (HOSPITAL_BASED_OUTPATIENT_CLINIC_OR_DEPARTMENT_OTHER): Payer: Medicare Other | Admitting: Oncology

## 2016-02-04 VITALS — BP 209/90 | HR 70 | Temp 98.2°F | Resp 18 | Ht 63.5 in | Wt 143.2 lb

## 2016-02-04 DIAGNOSIS — D701 Agranulocytosis secondary to cancer chemotherapy: Secondary | ICD-10-CM

## 2016-02-04 DIAGNOSIS — C779 Secondary and unspecified malignant neoplasm of lymph node, unspecified: Secondary | ICD-10-CM

## 2016-02-04 DIAGNOSIS — C50919 Malignant neoplasm of unspecified site of unspecified female breast: Secondary | ICD-10-CM

## 2016-02-04 DIAGNOSIS — C50411 Malignant neoplasm of upper-outer quadrant of right female breast: Secondary | ICD-10-CM

## 2016-02-04 DIAGNOSIS — C50912 Malignant neoplasm of unspecified site of left female breast: Secondary | ICD-10-CM

## 2016-02-04 DIAGNOSIS — N6320 Unspecified lump in the left breast, unspecified quadrant: Secondary | ICD-10-CM

## 2016-02-04 LAB — CBC WITH DIFFERENTIAL/PLATELET
BASO%: 0 % (ref 0.0–2.0)
Basophils Absolute: 0 10*3/uL (ref 0.0–0.1)
EOS%: 1.2 % (ref 0.0–7.0)
Eosinophils Absolute: 0 10*3/uL (ref 0.0–0.5)
HCT: 34.8 % (ref 34.8–46.6)
HGB: 11.3 g/dL — ABNORMAL LOW (ref 11.6–15.9)
LYMPH%: 45.9 % (ref 14.0–49.7)
MCH: 32 pg (ref 25.1–34.0)
MCHC: 32.5 g/dL (ref 31.5–36.0)
MCV: 98.6 fL (ref 79.5–101.0)
MONO#: 0.5 10*3/uL (ref 0.1–0.9)
MONO%: 20.7 % — AB (ref 0.0–14.0)
NEUT%: 32.2 % — AB (ref 38.4–76.8)
NEUTROS ABS: 0.8 10*3/uL — AB (ref 1.5–6.5)
Platelets: 172 10*3/uL (ref 145–400)
RBC: 3.53 10*6/uL — AB (ref 3.70–5.45)
RDW: 14.9 % — ABNORMAL HIGH (ref 11.2–14.5)
WBC: 2.4 10*3/uL — AB (ref 3.9–10.3)
lymph#: 1.1 10*3/uL (ref 0.9–3.3)

## 2016-02-04 LAB — COMPREHENSIVE METABOLIC PANEL
ALT: 23 U/L (ref 0–55)
ANION GAP: 9 meq/L (ref 3–11)
AST: 22 U/L (ref 5–34)
Albumin: 3.6 g/dL (ref 3.5–5.0)
Alkaline Phosphatase: 61 U/L (ref 40–150)
BUN: 7.8 mg/dL (ref 7.0–26.0)
CHLORIDE: 105 meq/L (ref 98–109)
CO2: 27 meq/L (ref 22–29)
CREATININE: 0.8 mg/dL (ref 0.6–1.1)
Calcium: 9 mg/dL (ref 8.4–10.4)
EGFR: 83 mL/min/{1.73_m2} — ABNORMAL LOW (ref >90)
GLUCOSE: 132 mg/dL (ref 70–140)
Potassium: 3.8 mEq/L (ref 3.5–5.1)
SODIUM: 142 meq/L (ref 136–145)
Total Bilirubin: 0.35 mg/dL (ref 0.20–1.20)
Total Protein: 7.2 g/dL (ref 6.4–8.3)

## 2016-02-04 MED ORDER — TBO-FILGRASTIM 300 MCG/0.5ML ~~LOC~~ SOSY
300.0000 ug | PREFILLED_SYRINGE | Freq: Once | SUBCUTANEOUS | Status: DC
  Filled 2016-02-04: qty 0.5

## 2016-02-04 MED ORDER — HYDROCODONE-ACETAMINOPHEN 5-325 MG PO TABS
1.0000 | ORAL_TABLET | Freq: Four times a day (QID) | ORAL | Status: DC | PRN
Start: 2016-02-04 — End: 2016-02-10

## 2016-02-04 NOTE — Progress Notes (Signed)
Patient ID: Janice Powell, female   DOB: September 09, 1945, 70 y.o.   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 noted a mass in her right breast sometime in 2011. 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 More recently her husband twisted her arm to get the mass looked at, and she brought it to Dr. Pennie Banter attention. Diagnostic mammography and right ultrasonography at [facility] 03/02/2012 showed a 6.78 cm irregular mass in the inner aspect of the right breast. There was nipple retraction and erythema around the nipple. The largest lymph node was noted in the right axilla. By ultrasound the large irregular hypoechoic mass was noted in the breast with multiple enlarged axillary lymph nodes. Physical exam confirmed a hard breast with erythema around the right nipple extending inferiorly. The nipple is slightly inverted.  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   Her subsequent history is as detailed below  INTERVAL HISTORY: [name] returns today for follow-up of her recurrent breast cancer. Today is day 1 cycle 2 of carboplatin and gemcitabine which are given days 1 and 8 of each 21 day cycle.   REVIEW OF SYSTEMS: [name] is generally doing well. She enjoyed the fourth, and went out with her family of and what's the fireworks. She still has pain on the left side, and she is taking her pain medicine every 4 hours during the day and every 6 hours at night. She is not constipated from this. Aside from that a detailed review of systems today was  stable  PAST MEDICAL HISTORY: Past Medical History  Diagnosis Date  . Hypertension   . Pneumonia     hx  . GERD (gastroesophageal reflux disease)     occ  . Osteopenia   . Breast cancer ([place]) 03/08/13    right breast - Invasive Ductal Carcinoma   . S/P radiation therapy     1) Right Chest Wall and IM nodes / 50 Gy in 25 fractions /2) Right Supraclavicular fossa / 50 Gy in 25 fractions/1) Right Chest Wall and IM nodes / 50 Gy in 25 fractions /2) Right Supraclavicular fossa / 50 Gy in 25 fractions/3) Right Posterior Axillary boost / 6.55 Gy in 25 fractions/4) Right Chest Wall Scar boost / 10 Gy in 5 fractions      . Hx antineoplastic chemotherapy     two cycles of neoadjuvant cyclophosphamide, docetaxel, doxorubicin ["TAC"] at standard doses with neulasta support; refused neulasta with cycles 2 and 4; tolerated treatment poorly (b) docetaxel was held for final 4 cycles; completed cycle 6 neoadjuvant chemotherapy 12/27/2012    . Wears glasses   . Primary malignant neoplasm of left breast with stage 2 nodal metastasis per American Joint Committee on Cancer 7th edition (N2) ([place]) 01/09/2016  GERD osteopenia  PAST SURGICAL HISTORY: Past Surgical History  Procedure Laterality Date  . Appendectomy  1992  . Tubal ligation  1979  . Cholecystectomy  2000  . Portacath placement  08/15/2012    Procedure: INSERTION PORT-A-CATH;  Surgeon: [name], MD;  Location: [place];  Service: General;  Laterality: Left;  Marland Kitchen Mastectomy Right 03/13/2013  . Breast biopsy Right   . Port-a-cath removal  03/13/2013  . Mastectomy modified radical Right 03/13/2013    Procedure: MASTECTOMY MODIFIED RADICAL;  Surgeon: [name], MD;  Location: [place];  Service: General;  Laterality: Right;  . Port-a-cath removal Left 03/13/2013    Procedure: REMOVAL PORT-A-CATH;  Surgeon: [name], MD;  Location: [place];  Service: General;  Laterality: Left;  . Portacath placement N/A 01/09/2016    Procedure: INSERTION  PORT-A-CATH;  Surgeon: [name], MD;  Location: [facility];  Service: General;  Laterality: N/A;    FAMILY HISTORY Family History  Problem Relation Age of Onset  . Heart disease Mother    the patient's father died in his sleep at age 84. The patient's mother died at the age of 32 from a myocardial infarction. The patient has one brother and 2 sisters, all surviving. There is no history of breast or ovarian cancer in the family.  GYNECOLOGIC HISTORY:  (Reviewed 12/07/2013) Menarche age 34, first live birth age 5, she is GX P5, menopause age 39. She did not take hormone replacement.  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 Her husband [name] worked in the post office 3 years, and is also now retired. He is a former Company secretary. Son [name] lives in [place] and manages an [employer] store. Daughter Birgitta Gehle also lives in [place] and manages the IT Department at [employer]. Son Valentina Devey III is a Engineer, materials. Daughter [name] is a Actuary for [employer]. Son [name] is an Actuary. The patient has 8 grandchildren. She attends [employer]   ADVANCED DIRECTIVES: Not in place  HEALTH MAINTENANCE:  (Updated 12/07/2013) Social History  Substance Use Topics  . Smoking status: Never Smoker   . Smokeless tobacco: Never Used  . Alcohol Use: No     Colonoscopy: Not on file  PAP: Not on file  Bone density:  January 2011   Lipid panel:  Not on file/ Dr. Shelia Media  No Known Allergies  Current Outpatient Prescriptions  Medication Sig Dispense Refill  . dexamethasone (DECADRON) 4 MG tablet Take 2 tablets (8 mg total) by mouth daily. Start the day after chemotherapy for 2 days. Take with food. 30 tablet 1  . HYDROcodone-acetaminophen (NORCO) 5-325 MG tablet Take 1-2 tablets by mouth every 6 (six) hours as needed. 120 tablet 0  . lidocaine-prilocaine (EMLA) cream Apply to affected area once 30  g 3  . lisinopril (PRINIVIL,ZESTRIL) 10 MG tablet TAKE 1 TABLET EVERY DAY 90 tablet 1  . lisinopril (PRINIVIL,ZESTRIL) 10 MG tablet TAKE 1 TABLET EVERY DAY 90 tablet 1  . LORazepam (ATIVAN) 0.5 MG tablet Take 1 tablet (0.5 mg total) by mouth at bedtime as needed (Nausea or vomiting). 30 tablet 0  . metoprolol succinate (TOPROL-XL) 50 MG 24 hr tablet Take 1 tablet (50 mg total) by mouth daily. 30 tablet 0  . metoprolol succinate (TOPROL-XL) 50 MG 24 hr tablet TAKE 1 TABLET BY MOUTH EVERY DAY WITH OR IMMEDIATELY FOLLOWING A MEAL 90 tablet 0  . omega-3 acid ethyl esters (LOVAZA) 1 G capsule Take 1 g by mouth daily.     . potassium chloride (K-DUR) 10 MEQ tablet Take 1 tablet (10 mEq total) by mouth daily. 60 tablet 5  . prochlorperazine (COMPAZINE) 10 MG tablet Take 1 tablet (10 mg total)  by mouth every 6 (six) hours as needed (Nausea or vomiting). 30 tablet 1   No current facility-administered medications for this visit.    OBJECTIVE: Middle-aged [place] American woman In no acute distress Filed Vitals:   02/04/16 0936  BP: 209/90  Pulse: 70  Temp: 98.2 F (36.8 C)  Resp: 18     Body mass index is 24.97 kg/(m^2).    ECOG FS: 1 Filed Weights   02/04/16 0936  Weight: 143 lb 3.2 oz (64.955 kg)    Sclerae unicteric, EOMs intact Oropharynx clear and moist No cervical or supraclavicular adenopathy Lungs no rales or rhonchi Heart regular rate and rhythm Abd soft, nontender, positive bowel sounds MSK no focal spinal tenderness, grade 1 left upper extremity lymphedema Neuro: nonfocal, well oriented, appropriate affect Breasts: Deferred    Photo of left breast 12/17/2015    LAB RESULTS: CBC    Component Value Date/Time   WBC 2.4* 02/04/2016 0851   WBC 6.0 01/07/2016 1330   RBC 3.53* 02/04/2016 0851   RBC 4.12 01/07/2016 1330   HGB 11.3* 02/04/2016 0851   HGB 13.3 01/07/2016 1330   HCT 34.8 02/04/2016 0851   HCT 40.4 01/07/2016 1330   PLT 172 02/04/2016 0851   PLT 300  01/07/2016 1330   MCV 98.6 02/04/2016 0851   MCV 98.1 01/07/2016 1330   MCH 32.0 02/04/2016 0851   MCH 32.3 01/07/2016 1330   MCHC 32.5 02/04/2016 0851   MCHC 32.9 01/07/2016 1330   RDW 14.9* 02/04/2016 0851   RDW 14.4 01/07/2016 1330   LYMPHSABS 1.1 02/04/2016 0851   LYMPHSABS 1.8 01/07/2016 1330   MONOABS 0.5 02/04/2016 0851   MONOABS 0.6 01/07/2016 1330   EOSABS 0.0 02/04/2016 0851   EOSABS 0.0 01/07/2016 1330   BASOSABS 0.0 02/04/2016 0851   BASOSABS 0.0 01/07/2016 1330    CMP     Component Value Date/Time   NA 142 02/04/2016 0851   NA 139 01/07/2016 1330   K 3.8 02/04/2016 0851   K 4.1 01/07/2016 1330   CL 102 01/07/2016 1330   CL 107 01/11/2013 0853   CO2 27 02/04/2016 0851   CO2 30 01/07/2016 1330   GLUCOSE 132 02/04/2016 0851   GLUCOSE 99 01/07/2016 1330   GLUCOSE 123* 01/11/2013 0853   BUN 7.8 02/04/2016 0851   BUN 11 01/07/2016 1330   CREATININE 0.8 02/04/2016 0851   CREATININE 0.80 01/07/2016 1330   CALCIUM 9.0 02/04/2016 0851   CALCIUM 9.0 01/07/2016 1330   PROT 7.2 02/04/2016 0851   PROT 7.5 01/07/2016 1330   ALBUMIN 3.6 02/04/2016 0851   ALBUMIN 4.1 01/07/2016 1330   AST 22 02/04/2016 0851   AST 32 01/07/2016 1330   ALT 23 02/04/2016 0851   ALT 17 01/07/2016 1330   ALKPHOS 61 02/04/2016 0851   ALKPHOS 41 01/07/2016 1330   BILITOT 0.35 02/04/2016 0851   BILITOT 1.4* 01/07/2016 1330   GFRNONAA >60 01/07/2016 1330   GFRAA >60 01/07/2016 1330       STUDIES: Chest 2 View  01/07/2016  CLINICAL DATA:  No chest c/o. Nonsmoker. Pre op for port-a-cath insertion. Hx of breast ca EXAM: CHEST  2 VIEW COMPARISON:  Chest CT, 12/16/2015.  Chest radiograph, 08/15/2012. FINDINGS: Mild enlargement of cardiac silhouette. No mediastinal or hilar masses or evidence of adenopathy. Lungs are clear.  No pleural effusion or pneumothorax. Bony thorax demineralized intact. No convincing osteoblastic or osteolytic lesion. There changes from right mastectomy. Soft tissue  fullness along the left  axilla is consistent known adenopathy. IMPRESSION: No acute cardiopulmonary disease. Electronically Signed   By: Lajean Manes M.D.   On: 01/07/2016 15:21   Dg Chest Port 1 View  01/09/2016  CLINICAL DATA:  Status post Port-A-Cath placement. History of breast cancer. EXAM: PORTABLE CHEST 1 VIEW COMPARISON:  Radiograph of January 07, 2016. FINDINGS: Stable cardiomediastinal silhouette. Interval placement of right subclavian Port-A-Cath with distal tip in expected position of the SVC. No pneumothorax or pleural effusion is noted. Right axillary surgical clips are noted. Mild left basilar scarring or subsegmental atelectasis is noted. Bony thorax appears intact. IMPRESSION: Interval placement of right subclavian Port-A-Cath with distal tip in expected position of SVC. No pneumothorax is noted. Electronically Signed   By: Marijo Conception, M.D.   On: 01/09/2016 08:25   Dg C-arm 1-60 Min-no Report  01/09/2016  CLINICAL DATA: surgery C-ARM 1-60 MINUTES Fluoroscopy was utilized by the requesting physician.  No radiographic interpretation.    ASSESSMENT: 70 y.o. [place] woman   (1)  s/p Right breast upper outer quadrant and axillary node biopsy 03/08/2012 for a clinical T4, N1-2', stage IIIB invasive ductal carcinoma, grade 3, triple negative, with an MIB-1 of 93%.  (2) definitive staging and treatment delayed as patient canceled staging studies and tried alternative treatments; with eventual progression  (3) neoadjuvant chemotherapy started 08/23/12  (a) received two cycles of neoadjuvant cyclophosphamide, docetaxel, doxorubicin ["TAC"] at standard doses with neulasta support; refused neulasta with cycles 2 and 4; tolerated treatment poorly  (b) docetaxel was held for final 4 cycles; completed cycle 6 neoadjuvant chemotherapy 12/27/2012    (4) status post right modified radical mastectomy 03/13/2013 showing no residual carcinoma in the breast, but nine of 9 axillary lymph nodes  sampled involved with macro metastases (ypTX, ypN2, stage IIIA)-- repeat prognostic panel again triple negative  (5) radiation therapy completed 05/31/2013  (6) patient opted against reconstruction  METASTATIC DISEASE: 7) left axillary adenopathy noted on mammography June 2015, confirmed on CT scan November 2015  (a) patient refused further workup or treatment on multiple visits (see notes)  (8) chest CT 12/16/2015 shows progressive left axillary and subpectoral adenopathy, left upper extremity lymphedema, left breast soft tissue fullness and skin thickening  (a) left axillary lymph node biopsy 12/26/2015 confirms metastatic carcinoma, triple negative  (9) started gemcitabine/carboplatin 01/13/2016, repeated day 1 and day 8 of each 21 cycle   PLAN: Charlee is tolerating her chemotherapy generally well. The problem is that her counts today are low and she will not be able to receive her treatment today.  I'm going to give her Neupogen today, tomorrow and the day after tomorrow. She will return to see me a week from now and hopefully we will be able to start cycle 2. The question is whether we want to change her chemotherapy to every 2 weeks so that we can give her Neulasta in the future or continue with the day 1 and day 8 in which case we are going to have to give her Neupogen for 3 days after each day 1 treatment.  I refilled her pain medicine today. She is doing a good job at controlling her pain and not getting constipated from the narcotics.  The plan is trying to get through 3 cycles of chemotherapy and then restage.  She knows to call for any problems that may develop before her next visit here.     Chauncey Cruel, MD    02/04/2016

## 2016-02-04 NOTE — Telephone Encounter (Signed)
appt made and avs printed °

## 2016-02-05 ENCOUNTER — Ambulatory Visit (HOSPITAL_BASED_OUTPATIENT_CLINIC_OR_DEPARTMENT_OTHER): Payer: Medicare Other

## 2016-02-05 VITALS — BP 160/79 | HR 66 | Temp 98.6°F | Resp 20

## 2016-02-05 DIAGNOSIS — Z5189 Encounter for other specified aftercare: Secondary | ICD-10-CM | POA: Diagnosis not present

## 2016-02-05 DIAGNOSIS — N6320 Unspecified lump in the left breast, unspecified quadrant: Secondary | ICD-10-CM

## 2016-02-05 DIAGNOSIS — C50411 Malignant neoplasm of upper-outer quadrant of right female breast: Secondary | ICD-10-CM | POA: Diagnosis present

## 2016-02-05 MED ORDER — TBO-FILGRASTIM 300 MCG/0.5ML ~~LOC~~ SOSY
300.0000 ug | PREFILLED_SYRINGE | Freq: Once | SUBCUTANEOUS | Status: AC
  Administered 2016-02-05: 300 ug via SUBCUTANEOUS
  Filled 2016-02-05: qty 0.5

## 2016-02-05 NOTE — Patient Instructions (Signed)

## 2016-02-06 ENCOUNTER — Ambulatory Visit (HOSPITAL_BASED_OUTPATIENT_CLINIC_OR_DEPARTMENT_OTHER): Payer: Medicare Other

## 2016-02-06 VITALS — BP 224/72 | HR 66 | Temp 98.6°F | Resp 20

## 2016-02-06 DIAGNOSIS — Z5189 Encounter for other specified aftercare: Secondary | ICD-10-CM | POA: Diagnosis not present

## 2016-02-06 DIAGNOSIS — C50411 Malignant neoplasm of upper-outer quadrant of right female breast: Secondary | ICD-10-CM | POA: Diagnosis present

## 2016-02-06 DIAGNOSIS — N6320 Unspecified lump in the left breast, unspecified quadrant: Secondary | ICD-10-CM

## 2016-02-06 MED ORDER — TBO-FILGRASTIM 300 MCG/0.5ML ~~LOC~~ SOSY
300.0000 ug | PREFILLED_SYRINGE | Freq: Once | SUBCUTANEOUS | Status: AC
  Administered 2016-02-06: 300 ug via SUBCUTANEOUS
  Filled 2016-02-06: qty 0.5

## 2016-02-06 NOTE — Progress Notes (Signed)
Pt in for granix injection today. BP 224/72 rechecked after 5 minutes 191/ 73. HR 66. Patient states she just took her BP meds before coming in and that it is always that high coming to the cancer center.  Called and spoke with Val, Rn with Dr. Jana Hakim. Okay to give Granix injection today per Dr. Jana Hakim. Advised patient to stay and have BP rechecked in one hour. patient refused and will check BP when returning home and will call with the result. Called and left message for Val that patient refused to stay for recheck and to call and follow up with patient in an hour or 2.

## 2016-02-06 NOTE — Patient Instructions (Signed)

## 2016-02-09 NOTE — Progress Notes (Signed)
Patient ID: Janice Powell, female   DOB: 10-17-1945, 70 y.o.   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 More recently her husband twisted her arm to get the mass looked at, and she brought it to Dr. Pennie Banter attention. Diagnostic mammography and right ultrasonography at [facility] 03/02/2012 showed a 6.78 cm irregular mass in the inner aspect of the right breast. There was nipple retraction and erythema around the nipple. The largest lymph node was noted in the right axilla. By ultrasound the large irregular hypoechoic mass was noted in the breast with multiple enlarged axillary lymph nodes. Physical exam confirmed a hard breast with erythema around the right nipple extending inferiorly. The nipple is slightly inverted.  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   Her subsequent history is as detailed below  INTERVAL HISTORY: Janice Powell returns today for follow-up of her triple negative breast cancer. Today is day 1 cycle 2 of carboplatin and gemcitabine which are given days 1 and 8 of each 21 day cycle.   Her blood pressure continues to be elevated. She assures me she is taking her medicines in that her blood pressure at home is "normal". She says she "hates to come here" and that is why brings her blood pressure up.  REVIEW OF SYSTEMS: Janice Powell does not feel there has been any improvement in her left upper extremity lymphedema. She feels  frustrated. She tells me she is not going to be able to receive her day 8 treatment this cycle because she will be at the beach with her family she requested additional pain medications so that she would not run out while out of town. She is not constipated from the pain medication and it does seem to work for her. A detailed review of systems today was otherwise stable.  PAST MEDICAL HISTORY: Past Medical History  Diagnosis Date  . Hypertension   . Pneumonia     hx  . GERD (gastroesophageal reflux disease)     occ  . Osteopenia   . Breast cancer ([place]) 03/08/13    right breast - Invasive Ductal Carcinoma   . S/P radiation therapy     1) Right Chest Wall and IM nodes / 50 Gy in 25 fractions /2) Right Supraclavicular fossa / 50 Gy in 25 fractions/1) Right Chest Wall and IM nodes / 50 Gy in 25 fractions /2) Right Supraclavicular fossa / 50 Gy in 25 fractions/3) Right Posterior Axillary boost / 6.55 Gy in 25 fractions/4) Right Chest Wall Scar boost / 10 Gy in 5 fractions      . Hx antineoplastic chemotherapy     two cycles of neoadjuvant cyclophosphamide, docetaxel, doxorubicin ["TAC"] at standard doses with neulasta support; refused neulasta with cycles 2 and 4; tolerated treatment poorly (b) docetaxel was held for final 4 cycles; completed cycle 6 neoadjuvant chemotherapy 12/27/2012    . Wears glasses   . Primary malignant neoplasm of left breast with stage 2  nodal metastasis per American Joint Committee on Cancer 7th edition (N2) ([place]) 01/09/2016  GERD osteopenia  PAST SURGICAL HISTORY: Past Surgical History  Procedure Laterality Date  . Appendectomy  1992  . Tubal ligation  1979  . Cholecystectomy  2000  . Portacath placement  08/15/2012    Procedure: INSERTION PORT-A-CATH;  Surgeon: [name], MD;  Location: [facility];  Service: General;  Laterality: Left;  Marland Kitchen Mastectomy Right 03/13/2013  . Breast biopsy Right   . Port-a-cath removal  03/13/2013  . Mastectomy modified radical Right  03/13/2013    Procedure: MASTECTOMY MODIFIED RADICAL;  Surgeon: [name], MD;  Location: [place];  Service: General;  Laterality: Right;  . Port-a-cath removal Left 03/13/2013    Procedure: REMOVAL PORT-A-CATH;  Surgeon: [name], MD;  Location: [facility];  Service: General;  Laterality: Left;  . Portacath placement N/A 01/09/2016    Procedure: INSERTION PORT-A-CATH;  Surgeon: [name], MD;  Location: [facility];  Service: General;  Laterality: N/A;    FAMILY HISTORY Family History  Problem Relation Age of Onset  . Heart disease Mother    the patient's father died in his sleep at age 8. The patient's mother died at the age of 43 from a myocardial infarction. The patient has one brother and 2 sisters, all surviving. There is no history of breast or ovarian cancer in the family.  GYNECOLOGIC HISTORY:  (Reviewed 12/07/2013) Menarche age 12, first live birth age 25, she is GX P5, menopause age 54. She did not take hormone replacement.  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 Her husband [name] worked in the post office 60 years, and is also now retired. He is a former Company secretary. Son [name] lives in [place] and manages an [employer] store. Daughter Earlyne Ferderer also lives in [place] and manages the IT Department at [employer]. Son Cammie Knott III is a Engineer, materials. Daughter [name] is a Actuary for [employer]. Son [name] is an Actuary. The patient has 8 grandchildren. She attends [employer]   ADVANCED DIRECTIVES: Not in place  HEALTH MAINTENANCE:  (Updated 12/07/2013) Social History  Substance Use Topics  . Smoking status: Never Smoker   . Smokeless tobacco: Never Used  . Alcohol Use: No     Colonoscopy: Not on file  PAP: Not on file  Bone density:  January 2011   Lipid panel:  Not on file/ Dr. Shelia Media  No Known Allergies  Current Outpatient Prescriptions  Medication  Sig Dispense Refill  . dexamethasone (DECADRON) 4 MG tablet Take 2 tablets (8 mg total) by mouth daily. Start the day after chemotherapy for 2 days. Take with food. 30 tablet 1  . HYDROcodone-acetaminophen (NORCO) 5-325 MG tablet Take 1-2 tablets by mouth every 6 (six) hours as needed. 120 tablet 0  . lidocaine-prilocaine (EMLA) cream Apply to affected area once 30 g 3  . lisinopril (PRINIVIL,ZESTRIL) 10 MG tablet TAKE 1 TABLET EVERY DAY 90 tablet 1  . LORazepam (ATIVAN) 0.5 MG tablet Take 1 tablet (0.5 mg total) by mouth at bedtime as needed (Nausea or vomiting). 30 tablet 0  . metoprolol succinate (TOPROL-XL) 50 MG 24 hr tablet TAKE 1 TABLET BY MOUTH EVERY DAY WITH OR IMMEDIATELY FOLLOWING A MEAL 90 tablet 0  . omega-3 acid ethyl esters (LOVAZA) 1 G capsule Take 1 g by mouth daily.     . potassium chloride (  K-DUR) 10 MEQ tablet Take 1 tablet (10 mEq total) by mouth daily. 60 tablet 5  . prochlorperazine (COMPAZINE) 10 MG tablet Take 1 tablet (10 mg total) by mouth every 6 (six) hours as needed (Nausea or vomiting). 30 tablet 1   No current facility-administered medications for this visit.    OBJECTIVE: Middle-aged [place] American woman Who appears stated age 36 Vitals:   02/10/16 1055  BP: 212/97  Pulse: 67  Temp: 98.3 F (36.8 C)  Resp: 18     Body mass index is 24.65 kg/(m^2).    ECOG FS: 2 Filed Weights   02/10/16 1055  Weight: 141 lb 6.4 oz (64.139 kg)    Sclerae unicteric, EOMs intact Oropharynx clear and moist No cervical or supraclavicular adenopathy Lungs no rales or rhonchi Heart regular rate and rhythm Abd soft, nontender, positive bowel sounds MSK no focal spinal tenderness, chronic left upper extremity lymphedema Neuro: nonfocal, well oriented, appropriate affect Breasts: Deferred     Photo of left breast 12/17/2015    LAB RESULTS: CBC    Component Value Date/Time   WBC 6.2 02/10/2016 1037   WBC 6.0 01/07/2016 1330   RBC 3.59* 02/10/2016 1037    RBC 4.12 01/07/2016 1330   HGB 11.7 02/10/2016 1037   HGB 13.3 01/07/2016 1330   HCT 35.0 02/10/2016 1037   HCT 40.4 01/07/2016 1330   PLT 180 02/10/2016 1037   PLT 300 01/07/2016 1330   MCV 97.5 02/10/2016 1037   MCV 98.1 01/07/2016 1330   MCH 32.6 02/10/2016 1037   MCH 32.3 01/07/2016 1330   MCHC 33.4 02/10/2016 1037   MCHC 32.9 01/07/2016 1330   RDW 15.7* 02/10/2016 1037   RDW 14.4 01/07/2016 1330   LYMPHSABS 1.7 02/10/2016 1037   LYMPHSABS 1.8 01/07/2016 1330   MONOABS 1.9* 02/10/2016 1037   MONOABS 0.6 01/07/2016 1330   EOSABS 0.0 02/10/2016 1037   EOSABS 0.0 01/07/2016 1330   BASOSABS 0.0 02/10/2016 1037   BASOSABS 0.0 01/07/2016 1330    CMP     Component Value Date/Time   NA 142 02/04/2016 0851   NA 139 01/07/2016 1330   K 3.8 02/04/2016 0851   K 4.1 01/07/2016 1330   CL 102 01/07/2016 1330   CL 107 01/11/2013 0853   CO2 27 02/04/2016 0851   CO2 30 01/07/2016 1330   GLUCOSE 132 02/04/2016 0851   GLUCOSE 99 01/07/2016 1330   GLUCOSE 123* 01/11/2013 0853   BUN 7.8 02/04/2016 0851   BUN 11 01/07/2016 1330   CREATININE 0.8 02/04/2016 0851   CREATININE 0.80 01/07/2016 1330   CALCIUM 9.0 02/04/2016 0851   CALCIUM 9.0 01/07/2016 1330   PROT 7.2 02/04/2016 0851   PROT 7.5 01/07/2016 1330   ALBUMIN 3.6 02/04/2016 0851   ALBUMIN 4.1 01/07/2016 1330   AST 22 02/04/2016 0851   AST 32 01/07/2016 1330   ALT 23 02/04/2016 0851   ALT 17 01/07/2016 1330   ALKPHOS 61 02/04/2016 0851   ALKPHOS 41 01/07/2016 1330   BILITOT 0.35 02/04/2016 0851   BILITOT 1.4* 01/07/2016 1330   GFRNONAA >60 01/07/2016 1330   GFRAA >60 01/07/2016 1330       STUDIES: No results found.  ASSESSMENT: 70 y.o. Janice Powell woman   (1)  s/p Right breast upper outer quadrant and axillary node biopsy 03/08/2012 for a clinical T4, N1-2', stage IIIB invasive ductal carcinoma, grade 3, triple negative, with an MIB-1 of 93%.  (2) definitive staging and treatment delayed as patient canceled  staging studies and tried alternative treatments; with eventual progression  (3) neoadjuvant chemotherapy started 08/23/12  (a) received two cycles of neoadjuvant cyclophosphamide, docetaxel, doxorubicin ["TAC"] at standard doses with neulasta support; refused neulasta with cycles 2 and 4; tolerated treatment poorly  (b) docetaxel was held for final 4 cycles; completed cycle 6 neoadjuvant chemotherapy 12/27/2012    (4) status post right modified radical mastectomy 03/13/2013 showing no residual carcinoma in the breast, but nine of 9 axillary lymph nodes sampled involved with macro metastases (ypTX, ypN2, stage IIIA)-- repeat prognostic panel again triple negative  (5) radiation therapy completed 05/31/2013  (6) patient opted against reconstruction  METASTATIC DISEASE: 7) left axillary adenopathy noted on mammography June 2015, confirmed on CT scan November 2015  (a) patient refused further workup or treatment on multiple visits (see notes)  (8) chest CT 12/16/2015 shows progressive left axillary and subpectoral adenopathy, left upper extremity lymphedema, left breast soft tissue fullness and skin thickening  (a) left axillary lymph node biopsy 12/26/2015 confirms metastatic carcinoma, triple negative  (9) started gemcitabine/carboplatin 01/13/2016, repeated day 1 and day 8 of each 21 cycle   PLAN: Manreet will proceed to the start of cycle 2 today. However next week she is going to be at the beach, she tells me, so we are not going to do the day 8 treatment.  She will start cycle 3 on 02/24/2016 and she will see my 53 assistant with the day 1 and the 8 treatment of that cycle.  We again discussed her blood pressure problem. She assures me she is taking her blood pressure medicines and that at home her blood pressure is normal. "I had to come here". I suggested possibly she could take some lorazepam before coming in that may lower the anxiety associated with her visits here.  She  will see me August 21, at the start of her fourth cycle. After that she will be restaged.  She knows to call for any problems that may develop before her next visit here.    Chauncey Cruel, MD    02/10/2016

## 2016-02-10 ENCOUNTER — Telehealth: Payer: Self-pay | Admitting: *Deleted

## 2016-02-10 ENCOUNTER — Ambulatory Visit (HOSPITAL_BASED_OUTPATIENT_CLINIC_OR_DEPARTMENT_OTHER): Payer: Medicare Other

## 2016-02-10 ENCOUNTER — Ambulatory Visit (HOSPITAL_BASED_OUTPATIENT_CLINIC_OR_DEPARTMENT_OTHER): Payer: Medicare Other | Admitting: Oncology

## 2016-02-10 ENCOUNTER — Telehealth: Payer: Self-pay | Admitting: Oncology

## 2016-02-10 ENCOUNTER — Other Ambulatory Visit (HOSPITAL_BASED_OUTPATIENT_CLINIC_OR_DEPARTMENT_OTHER): Payer: Medicare Other

## 2016-02-10 VITALS — BP 212/97 | HR 67 | Temp 98.3°F | Resp 18 | Ht 63.5 in | Wt 141.4 lb

## 2016-02-10 DIAGNOSIS — C50919 Malignant neoplasm of unspecified site of unspecified female breast: Secondary | ICD-10-CM

## 2016-02-10 DIAGNOSIS — C50411 Malignant neoplasm of upper-outer quadrant of right female breast: Secondary | ICD-10-CM

## 2016-02-10 DIAGNOSIS — G893 Neoplasm related pain (acute) (chronic): Secondary | ICD-10-CM | POA: Diagnosis not present

## 2016-02-10 DIAGNOSIS — Z5111 Encounter for antineoplastic chemotherapy: Secondary | ICD-10-CM | POA: Diagnosis present

## 2016-02-10 DIAGNOSIS — C50912 Malignant neoplasm of unspecified site of left female breast: Secondary | ICD-10-CM

## 2016-02-10 DIAGNOSIS — C773 Secondary and unspecified malignant neoplasm of axilla and upper limb lymph nodes: Secondary | ICD-10-CM | POA: Diagnosis not present

## 2016-02-10 DIAGNOSIS — I890 Lymphedema, not elsewhere classified: Secondary | ICD-10-CM | POA: Diagnosis not present

## 2016-02-10 DIAGNOSIS — N6320 Unspecified lump in the left breast, unspecified quadrant: Secondary | ICD-10-CM

## 2016-02-10 DIAGNOSIS — C779 Secondary and unspecified malignant neoplasm of lymph node, unspecified: Secondary | ICD-10-CM

## 2016-02-10 LAB — CBC WITH DIFFERENTIAL/PLATELET
BASO%: 0.3 % (ref 0.0–2.0)
Basophils Absolute: 0 10*3/uL (ref 0.0–0.1)
EOS%: 0.3 % (ref 0.0–7.0)
Eosinophils Absolute: 0 10*3/uL (ref 0.0–0.5)
HEMATOCRIT: 35 % (ref 34.8–46.6)
HEMOGLOBIN: 11.7 g/dL (ref 11.6–15.9)
LYMPH#: 1.7 10*3/uL (ref 0.9–3.3)
LYMPH%: 27.9 % (ref 14.0–49.7)
MCH: 32.6 pg (ref 25.1–34.0)
MCHC: 33.4 g/dL (ref 31.5–36.0)
MCV: 97.5 fL (ref 79.5–101.0)
MONO#: 1.9 10*3/uL — AB (ref 0.1–0.9)
MONO%: 31 % — ABNORMAL HIGH (ref 0.0–14.0)
NEUT#: 2.5 10*3/uL (ref 1.5–6.5)
NEUT%: 40.5 % (ref 38.4–76.8)
NRBC: 2 % — AB (ref 0–0)
Platelets: 180 10*3/uL (ref 145–400)
RBC: 3.59 10*6/uL — ABNORMAL LOW (ref 3.70–5.45)
RDW: 15.7 % — AB (ref 11.2–14.5)
WBC: 6.2 10*3/uL (ref 3.9–10.3)

## 2016-02-10 MED ORDER — SODIUM CHLORIDE 0.9 % IV SOLN
160.4000 mg | Freq: Once | INTRAVENOUS | Status: AC
  Administered 2016-02-10: 160 mg via INTRAVENOUS
  Filled 2016-02-10: qty 16

## 2016-02-10 MED ORDER — HEPARIN SOD (PORK) LOCK FLUSH 100 UNIT/ML IV SOLN
500.0000 [IU] | Freq: Once | INTRAVENOUS | Status: AC | PRN
  Administered 2016-02-10: 500 [IU]
  Filled 2016-02-10: qty 5

## 2016-02-10 MED ORDER — PALONOSETRON HCL INJECTION 0.25 MG/5ML
0.2500 mg | Freq: Once | INTRAVENOUS | Status: AC
  Administered 2016-02-10: 0.25 mg via INTRAVENOUS

## 2016-02-10 MED ORDER — SODIUM CHLORIDE 0.9 % IV SOLN
Freq: Once | INTRAVENOUS | Status: AC
  Administered 2016-02-10: 12:00:00 via INTRAVENOUS

## 2016-02-10 MED ORDER — HYDROCODONE-ACETAMINOPHEN 5-325 MG PO TABS: 1.0000 | ORAL_TABLET | Freq: Four times a day (QID) | ORAL | Status: DC | PRN

## 2016-02-10 MED ORDER — PALONOSETRON HCL INJECTION 0.25 MG/5ML
INTRAVENOUS | Status: AC
  Filled 2016-02-10: qty 5

## 2016-02-10 MED ORDER — SODIUM CHLORIDE 0.9 % IV SOLN
10.0000 mg | Freq: Once | INTRAVENOUS | Status: AC
  Administered 2016-02-10: 10 mg via INTRAVENOUS
  Filled 2016-02-10: qty 1

## 2016-02-10 MED ORDER — SODIUM CHLORIDE 0.9 % IV SOLN
1400.0000 mg | Freq: Once | INTRAVENOUS | Status: AC
  Administered 2016-02-10: 1400 mg via INTRAVENOUS
  Filled 2016-02-10: qty 36.82

## 2016-02-10 MED ORDER — SODIUM CHLORIDE 0.9% FLUSH
10.0000 mL | INTRAVENOUS | Status: DC | PRN
  Administered 2016-02-10: 10 mL
  Filled 2016-02-10: qty 10

## 2016-02-10 NOTE — Telephone Encounter (Signed)
appt made and avs printed °

## 2016-02-10 NOTE — Telephone Encounter (Signed)
Per MD ok to treat with current BP reading.

## 2016-02-10 NOTE — Patient Instructions (Signed)
[facility] Discharge Instructions for Patients Receiving Chemotherapy  Today you received the following chemotherapy agents Gemzar and Carboplatin. To help prevent nausea and vomiting after your treatment, we encourage you to take your nausea medication Compazine 10 mg every 6 hours as needed  If you develop nausea and vomiting that is not controlled by your nausea medication, call the clinic.   BELOW ARE SYMPTOMS THAT SHOULD BE REPORTED IMMEDIATELY:  *FEVER GREATER THAN 100.5 F  *CHILLS WITH OR WITHOUT FEVER  NAUSEA AND VOMITING THAT IS NOT CONTROLLED WITH YOUR NAUSEA MEDICATION  *UNUSUAL SHORTNESS OF BREATH  *UNUSUAL BRUISING OR BLEEDING  TENDERNESS IN MOUTH AND THROAT WITH OR WITHOUT PRESENCE OF ULCERS  *URINARY PROBLEMS  *BOWEL PROBLEMS  UNUSUAL RASH Items with * indicate a potential emergency and should be followed up as soon as possible.  Feel free to call the clinic you have any questions or concerns. The clinic phone number is [phone].  Please show the CHEMO ALERT CARD at check-in to the Emergency Department and triage nurse.   

## 2016-02-24 ENCOUNTER — Ambulatory Visit (HOSPITAL_BASED_OUTPATIENT_CLINIC_OR_DEPARTMENT_OTHER): Payer: Medicare Other

## 2016-02-24 ENCOUNTER — Other Ambulatory Visit (HOSPITAL_BASED_OUTPATIENT_CLINIC_OR_DEPARTMENT_OTHER): Payer: Medicare Other

## 2016-02-24 ENCOUNTER — Ambulatory Visit (HOSPITAL_BASED_OUTPATIENT_CLINIC_OR_DEPARTMENT_OTHER): Payer: Medicare Other | Admitting: Oncology

## 2016-02-24 ENCOUNTER — Encounter: Payer: Self-pay | Admitting: Oncology

## 2016-02-24 VITALS — BP 140/115 | HR 68 | Temp 98.1°F | Resp 16 | Ht 63.5 in | Wt 139.8 lb

## 2016-02-24 VITALS — BP 170/87

## 2016-02-24 DIAGNOSIS — I890 Lymphedema, not elsewhere classified: Secondary | ICD-10-CM | POA: Diagnosis not present

## 2016-02-24 DIAGNOSIS — C50411 Malignant neoplasm of upper-outer quadrant of right female breast: Secondary | ICD-10-CM

## 2016-02-24 DIAGNOSIS — C50919 Malignant neoplasm of unspecified site of unspecified female breast: Secondary | ICD-10-CM

## 2016-02-24 DIAGNOSIS — Z5111 Encounter for antineoplastic chemotherapy: Secondary | ICD-10-CM | POA: Diagnosis present

## 2016-02-24 DIAGNOSIS — Z171 Estrogen receptor negative status [ER-]: Secondary | ICD-10-CM

## 2016-02-24 DIAGNOSIS — N6320 Unspecified lump in the left breast, unspecified quadrant: Secondary | ICD-10-CM

## 2016-02-24 LAB — CBC WITH DIFFERENTIAL/PLATELET
BASO%: 0.4 % (ref 0.0–2.0)
BASOS ABS: 0 10*3/uL (ref 0.0–0.1)
EOS%: 0.6 % (ref 0.0–7.0)
Eosinophils Absolute: 0 10*3/uL (ref 0.0–0.5)
HEMATOCRIT: 34.4 % — AB (ref 34.8–46.6)
HEMOGLOBIN: 11.2 g/dL — AB (ref 11.6–15.9)
LYMPH#: 1 10*3/uL (ref 0.9–3.3)
LYMPH%: 30 % (ref 14.0–49.7)
MCH: 32.3 pg (ref 25.1–34.0)
MCHC: 32.6 g/dL (ref 31.5–36.0)
MCV: 99.2 fL (ref 79.5–101.0)
MONO#: 0.6 10*3/uL (ref 0.1–0.9)
MONO%: 17.3 % — ABNORMAL HIGH (ref 0.0–14.0)
NEUT#: 1.8 10*3/uL (ref 1.5–6.5)
NEUT%: 51.7 % (ref 38.4–76.8)
Platelets: 171 10*3/uL (ref 145–400)
RBC: 3.46 10*6/uL — ABNORMAL LOW (ref 3.70–5.45)
RDW: 16.1 % — AB (ref 11.2–14.5)
WBC: 3.4 10*3/uL — ABNORMAL LOW (ref 3.9–10.3)

## 2016-02-24 LAB — COMPREHENSIVE METABOLIC PANEL
ALBUMIN: 3.6 g/dL (ref 3.5–5.0)
ALK PHOS: 66 U/L (ref 40–150)
ALT: 24 U/L (ref 0–55)
AST: 18 U/L (ref 5–34)
Anion Gap: 9 mEq/L (ref 3–11)
BUN: 11.3 mg/dL (ref 7.0–26.0)
CALCIUM: 9 mg/dL (ref 8.4–10.4)
CO2: 27 mEq/L (ref 22–29)
CREATININE: 0.8 mg/dL (ref 0.6–1.1)
Chloride: 105 mEq/L (ref 98–109)
EGFR: 85 mL/min/{1.73_m2} — ABNORMAL LOW (ref >90)
Glucose: 126 mg/dl (ref 70–140)
POTASSIUM: 3.7 meq/L (ref 3.5–5.1)
Sodium: 142 mEq/L (ref 136–145)
Total Bilirubin: 0.56 mg/dL (ref 0.20–1.20)
Total Protein: 7.1 g/dL (ref 6.4–8.3)

## 2016-02-24 MED ORDER — HEPARIN SOD (PORK) LOCK FLUSH 100 UNIT/ML IV SOLN
500.0000 [IU] | Freq: Once | INTRAVENOUS | Status: AC | PRN
  Administered 2016-02-24: 500 [IU]
  Filled 2016-02-24: qty 5

## 2016-02-24 MED ORDER — SODIUM CHLORIDE 0.9% FLUSH
10.0000 mL | INTRAVENOUS | Status: DC | PRN
  Administered 2016-02-24: 10 mL
  Filled 2016-02-24: qty 10

## 2016-02-24 MED ORDER — PALONOSETRON HCL INJECTION 0.25 MG/5ML
INTRAVENOUS | Status: AC
  Filled 2016-02-24: qty 5

## 2016-02-24 MED ORDER — SODIUM CHLORIDE 0.9 % IV SOLN
10.0000 mg | Freq: Once | INTRAVENOUS | Status: AC
  Administered 2016-02-24: 10 mg via INTRAVENOUS
  Filled 2016-02-24: qty 1

## 2016-02-24 MED ORDER — SODIUM CHLORIDE 0.9 % IV SOLN
160.0000 mg | Freq: Once | INTRAVENOUS | Status: AC
  Administered 2016-02-24: 160 mg via INTRAVENOUS
  Filled 2016-02-24: qty 16

## 2016-02-24 MED ORDER — SODIUM CHLORIDE 0.9 % IV SOLN
Freq: Once | INTRAVENOUS | Status: AC
  Administered 2016-02-24: 11:00:00 via INTRAVENOUS

## 2016-02-24 MED ORDER — PALONOSETRON HCL INJECTION 0.25 MG/5ML
0.2500 mg | Freq: Once | INTRAVENOUS | Status: AC
  Administered 2016-02-24: 0.25 mg via INTRAVENOUS

## 2016-02-24 MED ORDER — GEMCITABINE HCL CHEMO INJECTION 1 GM/26.3ML
1400.0000 mg | Freq: Once | INTRAVENOUS | Status: AC
  Administered 2016-02-24: 1400 mg via INTRAVENOUS
  Filled 2016-02-24: qty 36.82

## 2016-02-24 NOTE — Patient Instructions (Signed)
[facility] Discharge Instructions for Patients Receiving Chemotherapy  Today you received the following chemotherapy agents :  Gemzar/Carboplatin  To help prevent nausea and vomiting after your treatment, we encourage you to take your nausea medicationIf you develop nausea and vomiting that is not controlled by your nausea medication, call the clinic.   BELOW ARE SYMPTOMS THAT SHOULD BE REPORTED IMMEDIATELY:  *FEVER GREATER THAN 100.5 F  *CHILLS WITH OR WITHOUT FEVER  NAUSEA AND VOMITING THAT IS NOT CONTROLLED WITH YOUR NAUSEA MEDICATION  *UNUSUAL SHORTNESS OF BREATH  *UNUSUAL BRUISING OR BLEEDING  TENDERNESS IN MOUTH AND THROAT WITH OR WITHOUT PRESENCE OF ULCERS  *URINARY PROBLEMS  *BOWEL PROBLEMS  UNUSUAL RASH Items with * indicate a potential emergency and should be followed up as soon as possible.  Feel free to call the clinic you have any questions or concerns. The clinic phone number is [phone].  Please show the CHEMO ALERT CARD at check-in to the Emergency Department and triage nurse.   

## 2016-02-24 NOTE — Progress Notes (Signed)
Patient ID: Janice Powell, female   DOB: January 19, 1946, 70 y.o.   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 More recently her husband twisted her arm to get the mass looked at, and she brought it to Dr. Pennie Banter attention. Diagnostic mammography and right ultrasonography at [facility] 03/02/2012 showed a 6.78 cm irregular mass in the inner aspect of the right breast. There was nipple retraction and erythema around the nipple. The largest lymph node was noted in the right axilla. By ultrasound the large irregular hypoechoic mass was noted in the breast with multiple enlarged axillary lymph nodes. Physical exam confirmed a hard breast with erythema around the right nipple extending inferiorly. The nipple is slightly inverted.  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   Her subsequent history is as detailed below  INTERVAL HISTORY: Janice Powell returns today for follow-up of her triple negative breast cancer. Today is day 1 cycle 3 of carboplatin and gemcitabine which are given days 1 and 8 of each 21 day cycle. She skipped cycle 2 day 8 since she went on a beach trip. She had a good time at the beach and was able to engage in a lot of activities and go out to eat and spent some time on the beach.  Her blood pressure continues to be elevated. She assures me she is taking her medicines in that her blood pressure at home is "normal". She says she  "hates to come here" and that is why brings her blood pressure up.  REVIEW OF SYSTEMS: Janice Powell does not feel there has been any improvement in her left upper extremity lymphedema. She does not wear the sleeve that was given to her. Has decreased her pain medication used only one tablet per day. She reports that her appetite has been decreased for the past day or 2, but her weight is overall stable. She denies fevers, chills, abdominal pain, nausea, vomiting. A detailed review of systems today was otherwise stable.  PAST MEDICAL HISTORY: Past Medical History:  Diagnosis Date  . Breast cancer ([place]) 03/08/13   right breast - Invasive Ductal Carcinoma   . GERD (gastroesophageal reflux disease)    occ  . Hx antineoplastic chemotherapy    two cycles of neoadjuvant cyclophosphamide, docetaxel, doxorubicin ["TAC"] at standard doses with neulasta support; refused neulasta with cycles 2 and 4; tolerated treatment poorly (b) docetaxel was held for final 4 cycles; completed cycle 6 neoadjuvant chemotherapy 12/27/2012    . Hypertension   . Osteopenia   . Pneumonia    hx  . Primary malignant neoplasm of left breast with stage 2 nodal metastasis per American Joint Committee on Cancer 7th edition (N2) ([place]) 01/09/2016  . S/P radiation therapy    1) Right Chest Wall and IM nodes / 50 Gy in 25 fractions /2) Right Supraclavicular fossa / 50 Gy in 25 fractions/1) Right Chest Wall and IM nodes / 50 Gy in 25 fractions /2) Right Supraclavicular  fossa / 50 Gy in 25 fractions/3) Right Posterior Axillary boost / 6.55 Gy in 25 fractions/4) Right Chest Wall Scar boost / 10 Gy in 5 fractions      . Wears glasses   GERD osteopenia  PAST SURGICAL HISTORY: Past Surgical History:  Procedure Laterality Date  . APPENDECTOMY  1992  . BREAST BIOPSY Right   . CHOLECYSTECTOMY  2000  . MASTECTOMY Right 03/13/2013  . MASTECTOMY MODIFIED RADICAL Right 03/13/2013   Procedure: MASTECTOMY MODIFIED RADICAL;  Surgeon: [name], MD;  Location: [place];  Service: General;  Laterality: Right;  . PORT-A-CATH REMOVAL  03/13/2013  . PORT-A-CATH REMOVAL Left 03/13/2013   Procedure: REMOVAL PORT-A-CATH;  Surgeon: [name], MD;  Location: [place];  Service: General;  Laterality: Left;  . PORTACATH PLACEMENT  08/15/2012   Procedure: INSERTION PORT-A-CATH;  Surgeon: [name], MD;  Location: [place];  Service: General;  Laterality: Left;  . PORTACATH PLACEMENT N/A 01/09/2016   Procedure: INSERTION PORT-A-CATH;  Surgeon: [name], MD;  Location: [facility];  Service: General;  Laterality: N/A;  . TUBAL LIGATION  1979    FAMILY HISTORY Family History  Problem Relation Age of Onset  . Heart disease Mother    the patient's father died in his sleep at age 81. The patient's mother died at the age of 47 from a myocardial infarction. The patient has one brother and 2 sisters, all surviving. There is no history of breast or ovarian cancer in the family.  GYNECOLOGIC HISTORY:  (Reviewed 12/07/2013) Menarche age 16, first live birth age 70, she is GX P5, menopause age 77. She did not take hormone replacement.  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 Her husband Janice Powell worked in the post office 54 years, and is also now retired. He is a former Company secretary. Son Janice Powell lives in [place] and manages an [employer] store. Daughter Janice Powell also lives in [place] and manages the IT Department at [employer]. Son Janice Powell is a Engineer, materials. Daughter Janice Powell is a Actuary for [employer]. Son Janice Powell is an Actuary. The patient has 8 grandchildren. She attends [employer]   ADVANCED DIRECTIVES: Not in place  HEALTH MAINTENANCE:  (Updated 12/07/2013) Social History  Substance Use Topics  . Smoking status: Never Smoker  . Smokeless tobacco: Never Used  . Alcohol use No     Colonoscopy: Not on file  PAP: Not on  file  Bone density:  January 2011   Lipid panel:  Not on file/ Dr. Shelia Media  No Known Allergies  Current Outpatient Prescriptions  Medication Sig Dispense Refill  . dexamethasone (DECADRON) 4 MG tablet Take 2 tablets (8 mg total) by mouth daily. Start the day after chemotherapy for 2 days. Take with food. 30 tablet 1  . HYDROcodone-acetaminophen (NORCO) 5-325 MG tablet Take 1-2 tablets by mouth every 6 (six) hours as needed. 120 tablet 0  . lidocaine-prilocaine (EMLA) cream Apply to affected area once 30 g 3  . lisinopril (PRINIVIL,ZESTRIL) 10 MG tablet TAKE 1 TABLET EVERY DAY 90 tablet 1  . LORazepam (ATIVAN) 0.5 MG tablet Take 1 tablet (0.5 mg total) by mouth at bedtime as needed (Nausea or vomiting). 30 tablet 0  . metoprolol succinate (TOPROL-XL) 50 MG 24 hr tablet TAKE 1 TABLET BY MOUTH EVERY DAY WITH OR IMMEDIATELY FOLLOWING A MEAL 90 tablet 0  . omega-3 acid  ethyl esters (LOVAZA) 1 G capsule Take 1 g by mouth daily.     . potassium chloride (K-DUR) 10 MEQ tablet Take 1 tablet (10 mEq total) by mouth daily. 60 tablet 5  . prochlorperazine (COMPAZINE) 10 MG tablet Take 1 tablet (10 mg total) by mouth every 6 (six) hours as needed (Nausea or vomiting). 30 tablet 1   No current facility-administered medications for this visit.     OBJECTIVE: Middle-aged [place] American woman Who appears stated age There were no vitals filed for this visit.   There is no height or weight on file to calculate BMI.    ECOG FS: 2 There were no vitals filed for this visit.  Sclerae unicteric, EOMs intact Oropharynx clear and moist No cervical or supraclavicular adenopathy Lungs no rales or rhonchi Heart regular rate and rhythm Abd soft, nontender, positive bowel sounds MSK no focal spinal tenderness, chronic left upper extremity lymphedema Neuro: nonfocal, well oriented, appropriate affect Breasts: Deferred     Photo of left breast 12/17/2015    LAB RESULTS: CBC    Component Value Date/Time    WBC 3.4 (L) 02/24/2016 0921   WBC 6.0 01/07/2016 1330   RBC 3.46 (L) 02/24/2016 0921   RBC 4.12 01/07/2016 1330   HGB 11.2 (L) 02/24/2016 0921   HCT 34.4 (L) 02/24/2016 0921   PLT 171 02/24/2016 0921   MCV 99.2 02/24/2016 0921   MCH 32.3 02/24/2016 0921   MCH 32.3 01/07/2016 1330   MCHC 32.6 02/24/2016 0921   MCHC 32.9 01/07/2016 1330   RDW 16.1 (H) 02/24/2016 0921   LYMPHSABS 1.0 02/24/2016 0921   MONOABS 0.6 02/24/2016 0921   EOSABS 0.0 02/24/2016 0921   BASOSABS 0.0 02/24/2016 0921    CMP     Component Value Date/Time   NA 142 02/04/2016 0851   K 3.8 02/04/2016 0851   CL 102 01/07/2016 1330   CL 107 01/11/2013 0853   CO2 27 02/04/2016 0851   GLUCOSE 132 02/04/2016 0851   GLUCOSE 123 (H) 01/11/2013 0853   BUN 7.8 02/04/2016 0851   CREATININE 0.8 02/04/2016 0851   CALCIUM 9.0 02/04/2016 0851   PROT 7.2 02/04/2016 0851   ALBUMIN 3.6 02/04/2016 0851   AST 22 02/04/2016 0851   ALT 23 02/04/2016 0851   ALKPHOS 61 02/04/2016 0851   BILITOT 0.35 02/04/2016 0851   GFRNONAA >60 01/07/2016 1330   GFRAA >60 01/07/2016 1330       STUDIES: No results found.  ASSESSMENT: 70 y.o. [place] woman   (1)  s/p Right breast upper outer quadrant and axillary node biopsy 03/08/2012 for a clinical T4, N1-2', stage IIIB invasive ductal carcinoma, grade 3, triple negative, with an MIB-1 of 93%.  (2) definitive staging and treatment delayed as patient canceled staging studies and tried alternative treatments; with eventual progression  (3) neoadjuvant chemotherapy started 08/23/12  (a) received two cycles of neoadjuvant cyclophosphamide, docetaxel, doxorubicin ["TAC"] at standard doses with neulasta support; refused neulasta with cycles 2 and 4; tolerated treatment poorly  (b) docetaxel was held for final 4 cycles; completed cycle 6 neoadjuvant chemotherapy 12/27/2012    (4) status post right modified radical mastectomy 03/13/2013 showing no residual carcinoma in the breast, but  nine of 9 axillary lymph nodes sampled involved with macro metastases (ypTX, ypN2, stage IIIA)-- repeat prognostic panel again triple negative  (5) radiation therapy completed 05/31/2013  (6) patient opted against reconstruction  METASTATIC DISEASE: 7) left axillary adenopathy noted on mammography June 2015, confirmed on  CT scan November 2015  (a) patient refused further workup or treatment on multiple visits (see notes)  (8) chest CT 12/16/2015 shows progressive left axillary and subpectoral adenopathy, left upper extremity lymphedema, left breast soft tissue fullness and skin thickening  (a) left axillary lymph node biopsy 12/26/2015 confirms metastatic carcinoma, triple negative  (9) started gemcitabine/carboplatin 01/13/2016, repeated day 1 and day 8 of each 21 cycle   PLAN: Shaelee will proceed with cycle 3 day 1 today.   We again discussed her blood pressure problem. She assures me she is taking her blood pressure medicines and that at home her blood pressure is normal.   She will follow-up next week for cycle 3 day 8 and then see Dr. Jana Hakim on August 21. After that she will be restaged.  She knows to call for any problems that may develop before her next visit here.    Mikey Bussing, NP    02/24/2016

## 2016-03-02 ENCOUNTER — Telehealth: Payer: Self-pay | Admitting: Oncology

## 2016-03-02 ENCOUNTER — Other Ambulatory Visit (HOSPITAL_BASED_OUTPATIENT_CLINIC_OR_DEPARTMENT_OTHER): Payer: Medicare Other

## 2016-03-02 ENCOUNTER — Ambulatory Visit (HOSPITAL_BASED_OUTPATIENT_CLINIC_OR_DEPARTMENT_OTHER): Payer: Medicare Other | Admitting: Oncology

## 2016-03-02 ENCOUNTER — Ambulatory Visit: Payer: Medicare Other

## 2016-03-02 ENCOUNTER — Encounter: Payer: Self-pay | Admitting: Oncology

## 2016-03-02 VITALS — BP 212/96 | HR 68 | Temp 98.2°F | Resp 18 | Ht 63.5 in | Wt 140.5 lb

## 2016-03-02 DIAGNOSIS — G893 Neoplasm related pain (acute) (chronic): Secondary | ICD-10-CM | POA: Diagnosis not present

## 2016-03-02 DIAGNOSIS — Z5189 Encounter for other specified aftercare: Secondary | ICD-10-CM

## 2016-03-02 DIAGNOSIS — I890 Lymphedema, not elsewhere classified: Secondary | ICD-10-CM | POA: Diagnosis not present

## 2016-03-02 DIAGNOSIS — C773 Secondary and unspecified malignant neoplasm of axilla and upper limb lymph nodes: Secondary | ICD-10-CM | POA: Diagnosis not present

## 2016-03-02 DIAGNOSIS — C50411 Malignant neoplasm of upper-outer quadrant of right female breast: Secondary | ICD-10-CM

## 2016-03-02 DIAGNOSIS — C50919 Malignant neoplasm of unspecified site of unspecified female breast: Secondary | ICD-10-CM

## 2016-03-02 DIAGNOSIS — N6320 Unspecified lump in the left breast, unspecified quadrant: Secondary | ICD-10-CM

## 2016-03-02 LAB — CBC WITH DIFFERENTIAL/PLATELET
BASO%: 0.7 % (ref 0.0–2.0)
BASOS ABS: 0 10*3/uL (ref 0.0–0.1)
EOS ABS: 0 10*3/uL (ref 0.0–0.5)
EOS%: 0.4 % (ref 0.0–7.0)
HEMATOCRIT: 33.4 % — AB (ref 34.8–46.6)
HGB: 11.1 g/dL — ABNORMAL LOW (ref 11.6–15.9)
LYMPH%: 63.6 % — AB (ref 14.0–49.7)
MCH: 33.2 pg (ref 25.1–34.0)
MCHC: 33.2 g/dL (ref 31.5–36.0)
MCV: 100 fL (ref 79.5–101.0)
MONO#: 0.1 10*3/uL (ref 0.1–0.9)
MONO%: 7.6 % (ref 0.0–14.0)
NEUT%: 27.7 % — AB (ref 38.4–76.8)
NEUTROS ABS: 0.5 10*3/uL — AB (ref 1.5–6.5)
PLATELETS: 171 10*3/uL (ref 145–400)
RBC: 3.34 10*6/uL — ABNORMAL LOW (ref 3.70–5.45)
RDW: 16.3 % — ABNORMAL HIGH (ref 11.2–14.5)
WBC: 1.7 10*3/uL — ABNORMAL LOW (ref 3.9–10.3)
lymph#: 1.1 10*3/uL (ref 0.9–3.3)
nRBC: 2 % — ABNORMAL HIGH (ref 0–0)

## 2016-03-02 LAB — COMPREHENSIVE METABOLIC PANEL
ALT: 22 U/L (ref 0–55)
ANION GAP: 10 meq/L (ref 3–11)
AST: 19 U/L (ref 5–34)
Albumin: 3.7 g/dL (ref 3.5–5.0)
Alkaline Phosphatase: 64 U/L (ref 40–150)
BILIRUBIN TOTAL: 0.44 mg/dL (ref 0.20–1.20)
BUN: 8.6 mg/dL (ref 7.0–26.0)
CO2: 27 meq/L (ref 22–29)
CREATININE: 0.8 mg/dL (ref 0.6–1.1)
Calcium: 9.2 mg/dL (ref 8.4–10.4)
Chloride: 107 mEq/L (ref 98–109)
EGFR: >90 mL/min/{1.73_m2} (ref >90)
Glucose: 113 mg/dl (ref 70–140)
Potassium: 3.7 mEq/L (ref 3.5–5.1)
Sodium: 143 mEq/L (ref 136–145)
Total Protein: 7.3 g/dL (ref 6.4–8.3)

## 2016-03-02 MED ORDER — FILGRASTIM 300 MCG/0.5ML IJ SOSY
300.0000 ug | PREFILLED_SYRINGE | Freq: Once | INTRAMUSCULAR | Status: AC
  Administered 2016-03-02: 300 ug via SUBCUTANEOUS
  Filled 2016-03-02: qty 0.5

## 2016-03-02 MED ORDER — PEGFILGRASTIM INJECTION 6 MG/0.6ML ~~LOC~~: 6.0000 mg | PREFILLED_SYRINGE | Freq: Once | SUBCUTANEOUS | Status: DC

## 2016-03-02 NOTE — Progress Notes (Signed)
Patient ID: Janice Powell, female   DOB: 1945/10/09, 70 y.o.   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 More recently her husband twisted her arm to get the mass looked at, and she brought it to Dr. Pennie Banter attention. Diagnostic mammography and right ultrasonography at [facility] 03/02/2012 showed a 6.78 cm irregular mass in the inner aspect of the right breast. There was nipple retraction and erythema around the nipple. The largest lymph node was noted in the right axilla. By ultrasound the large irregular hypoechoic mass was noted in the breast with multiple enlarged axillary lymph nodes. Physical exam confirmed a hard breast with erythema around the right nipple extending inferiorly. The nipple is slightly inverted.  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   Her subsequent history is as detailed below  INTERVAL HISTORY: Janice Powell returns today for follow-up of her triple negative breast cancer. Today is day 8 cycle 3 of carboplatin and gemcitabine which are given days 1 and 8 of each 21 day cycle. Has had more fatigue since last chemo, but was able to go to church yesterday.  Her blood pressure continues to be elevated. She assures me she is taking her medicines in that her blood pressure at home is "normal". She says she "hates to come here" and that is why brings her blood pressure up.  REVIEW OF SYSTEMS: Janice Powell thinks that  there is some improvement in her left upper extremity lymphedema. She does not wear the sleeve that was given to her. Has decreased her pain medication to only a few times a week. She reports that her appetite is good. She denies fevers, chills, abdominal pain, nausea, vomiting. A detailed review of systems today was otherwise stable.  PAST MEDICAL HISTORY: Past Medical History:  Diagnosis Date  . Breast cancer ([place]) 03/08/13   right breast - Invasive Ductal Carcinoma   . GERD (gastroesophageal reflux disease)    occ  . Hx antineoplastic chemotherapy    two cycles of neoadjuvant cyclophosphamide, docetaxel, doxorubicin ["TAC"] at standard doses with neulasta support; refused neulasta with cycles 2 and 4; tolerated treatment poorly (b) docetaxel was held for final 4 cycles; completed cycle 6 neoadjuvant chemotherapy 12/27/2012    . Hypertension   . Osteopenia   . Pneumonia    hx  . Primary malignant neoplasm of left breast with stage 2 nodal metastasis per American Joint Committee on Cancer 7th edition (N2) ([place]) 01/09/2016  . S/P radiation therapy    1) Right Chest Wall and IM nodes / 50 Gy in 25 fractions /2) Right Supraclavicular fossa / 50 Gy in 25 fractions/1) Right Chest Wall and IM nodes / 50 Gy in 25 fractions /2) Right Supraclavicular fossa / 50 Gy in 25 fractions/3) Right Posterior Axillary boost / 6.55 Gy in 25 fractions/4) Right Chest Wall Scar boost / 10 Gy in 5 fractions      . Wears glasses   GERD osteopenia  PAST  SURGICAL HISTORY: Past Surgical History:  Procedure Laterality Date  . APPENDECTOMY  1992  . BREAST BIOPSY Right   . CHOLECYSTECTOMY  2000  . MASTECTOMY Right 03/13/2013  . MASTECTOMY MODIFIED RADICAL Right 03/13/2013   Procedure: MASTECTOMY MODIFIED RADICAL;  Surgeon: [name], MD;  Location: [place];  Service: General;  Laterality: Right;  . PORT-A-CATH REMOVAL  03/13/2013  . PORT-A-CATH REMOVAL Left 03/13/2013   Procedure: REMOVAL PORT-A-CATH;  Surgeon:  [name], MD;  Location: [place];  Service: General;  Laterality: Left;  . PORTACATH PLACEMENT  08/15/2012   Procedure: INSERTION PORT-A-CATH;  Surgeon: [name], MD;  Location: [place];  Service: General;  Laterality: Left;  . PORTACATH PLACEMENT N/A 01/09/2016   Procedure: INSERTION PORT-A-CATH;  Surgeon: Janice Skates, MD;  Location: [facility];  Service: General;  Laterality: N/A;  . TUBAL LIGATION  1979    FAMILY HISTORY Family History  Problem Relation Age of Onset  . Heart disease Mother    the patient's father died in his sleep at age 18. The patient's mother died at the age of 4 from a myocardial infarction. The patient has one brother and 2 sisters, all surviving. There is no history of breast or ovarian cancer in the family.  GYNECOLOGIC HISTORY:  (Reviewed 12/07/2013) Menarche age 47, first live birth age 57, she is GX P5, menopause age 62. She did not take hormone replacement.  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 He is a former Company secretary. Son Janice Powell lives in [place] and manages an [employer] store. Daughter Janice Powell also lives in [place] and manages the IT Department at [employer]. Son Janice Powell is a Engineer, materials. Daughter Janice Powell is a Actuary for [employer]. Son Janice Powell is an Actuary. The patient has 8 grandchildren. She attends [employer]   ADVANCED DIRECTIVES: Not in place  HEALTH MAINTENANCE:  (Updated 12/07/2013) Social History  Substance Use Topics  . Smoking status: Never Smoker  . Smokeless tobacco: Never Used  . Alcohol use No     Colonoscopy: Not on file  PAP: Not on file  Bone density:  January 2011   Lipid panel:  Not on file/ Dr. Shelia Media  No Known Allergies  Current Outpatient Prescriptions  Medication Sig Dispense Refill  . dexamethasone  (DECADRON) 4 MG tablet Take 2 tablets (8 mg total) by mouth daily. Start the day after chemotherapy for 2 days. Take with food. 30 tablet 1  . HYDROcodone-acetaminophen (NORCO) 5-325 MG tablet Take 1-2 tablets by mouth every 6 (six) hours as needed. 120 tablet 0  . lidocaine-prilocaine (EMLA) cream Apply to affected area once 30 g 3  . lisinopril (PRINIVIL,ZESTRIL) 10 MG tablet TAKE 1 TABLET EVERY DAY 90 tablet 1  . LORazepam (ATIVAN) 0.5 MG tablet Take 1 tablet (0.5 mg total) by mouth at bedtime as needed (Nausea or vomiting). 30 tablet 0  . metoprolol succinate (TOPROL-XL) 50 MG 24 hr tablet TAKE 1 TABLET BY MOUTH EVERY DAY WITH OR IMMEDIATELY FOLLOWING A MEAL 90 tablet 0  . omega-3 acid ethyl esters (LOVAZA) 1 G capsule Take 1 g by mouth daily.     . potassium chloride (K-DUR) 10 MEQ tablet Take 1 tablet (10 mEq total) by mouth daily. 60 tablet 5  . prochlorperazine (COMPAZINE) 10 MG tablet  Take 1 tablet (10 mg total) by mouth every 6 (six) hours as needed (Nausea or vomiting). (Patient not taking: Reported on 03/02/2016) 30 tablet 1   No current facility-administered medications for this visit.     OBJECTIVE: Middle-aged [place] American woman Who appears stated age 15:   03/02/16 1146  BP: (!) 212/96  Pulse: 68  Resp: 18  Temp: 98.2 F (36.8 C)     Body mass index is 24.5 kg/m.    ECOG FS: 2 Filed Weights   03/02/16 1146  Weight: 140 lb 8 oz (63.7 kg)    Sclerae unicteric, EOMs intact Oropharynx clear and moist No cervical or supraclavicular adenopathy Lungs no rales or rhonchi Heart regular rate and rhythm Abd soft, nontender, positive bowel sounds MSK no focal spinal tenderness, chronic left upper extremity lymphedema Neuro: nonfocal, well oriented, appropriate affect Breasts: Deferred     Photo of left breast 12/17/2015    LAB RESULTS: CBC    Component Value Date/Time   WBC 1.7 (L) 03/02/2016 1126   WBC 6.0 01/07/2016 1330   RBC 3.34 (L) 03/02/2016 1126    RBC 4.12 01/07/2016 1330   HGB 11.1 (L) 03/02/2016 1126   HCT 33.4 (L) 03/02/2016 1126   PLT 171 03/02/2016 1126   MCV 100.0 03/02/2016 1126   MCH 33.2 03/02/2016 1126   MCH 32.3 01/07/2016 1330   MCHC 33.2 03/02/2016 1126   MCHC 32.9 01/07/2016 1330   RDW 16.3 (H) 03/02/2016 1126   LYMPHSABS 1.1 03/02/2016 1126   MONOABS 0.1 03/02/2016 1126   EOSABS 0.0 03/02/2016 1126   BASOSABS 0.0 03/02/2016 1126    CMP     Component Value Date/Time   NA 143 03/02/2016 1126   K 3.7 03/02/2016 1126   CL 102 01/07/2016 1330   CL 107 01/11/2013 0853   CO2 27 03/02/2016 1126   GLUCOSE 113 03/02/2016 1126   GLUCOSE 123 (H) 01/11/2013 0853   BUN 8.6 03/02/2016 1126   CREATININE 0.8 03/02/2016 1126   CALCIUM 9.2 03/02/2016 1126   PROT 7.3 03/02/2016 1126   ALBUMIN 3.7 03/02/2016 1126   AST 19 03/02/2016 1126   ALT 22 03/02/2016 1126   ALKPHOS 64 03/02/2016 1126   BILITOT 0.44 03/02/2016 1126   GFRNONAA >60 01/07/2016 1330   GFRAA >60 01/07/2016 1330       STUDIES: No results found.  ASSESSMENT: 70 y.o. [place] woman   (1)  s/p Right breast upper outer quadrant and axillary node biopsy 03/08/2012 for a clinical T4, N1-2', stage IIIB invasive ductal carcinoma, grade 3, triple negative, with an MIB-1 of 93%.  (2) definitive staging and treatment delayed as patient canceled staging studies and tried alternative treatments; with eventual progression  (3) neoadjuvant chemotherapy started 08/23/12  (a) received two cycles of neoadjuvant cyclophosphamide, docetaxel, doxorubicin ["TAC"] at standard doses with neulasta support; refused neulasta with cycles 2 and 4; tolerated treatment poorly  (b) docetaxel was held for final 4 cycles; completed cycle 6 neoadjuvant chemotherapy 12/27/2012    (4) status post right modified radical mastectomy 03/13/2013 showing no residual carcinoma in the breast, but nine of 9 axillary lymph nodes sampled involved with macro metastases (ypTX, ypN2, stage  IIIA)-- repeat prognostic panel again triple negative  (5) radiation therapy completed 05/31/2013  (6) patient opted against reconstruction  METASTATIC DISEASE: 7) left axillary adenopathy noted on mammography June 2015, confirmed on CT scan November 2015  (a) patient refused further workup or treatment on multiple visits (see notes)  (  8) chest CT 12/16/2015 shows progressive left axillary and subpectoral adenopathy, left upper extremity lymphedema, left breast soft tissue fullness and skin thickening  (a) left axillary lymph node biopsy 12/26/2015 confirms metastatic carcinoma, triple negative  (9) started gemcitabine/carboplatin 01/13/2016, repeated day 1 and day 8 of each 21 cycle   PLAN: Jalani's WBC is low today. Will hold chemotherapy. Reviewed counts with Dr. Jana Hakim. Recommends Neulasta for the patient. Discussed recommendation with the patient and she refuses Neulasta. States that she has had this before and it is "too strong." She is agreeable to receiving Granix which she has received previously. I have recommended that she receive this for 3 days, but she states that she will only come for two. Granix given today and appt requested for tomorrow. Patient will need Granix for 2-3 days after day 1 of each cycle going forward.  Reviewed neutropenic precautions with the patient.   We again discussed her blood pressure problem. She assures me she is taking her blood pressure medicines and that at home her blood pressure is normal.   She will follow-up with Dr. Jana Hakim on August 21st. After that she will be restaged.  She knows to call for any problems that may develop before her next visit here.    Mikey Bussing, NP    03/02/2016

## 2016-03-02 NOTE — Telephone Encounter (Signed)
Gave ot cal & avs

## 2016-03-03 ENCOUNTER — Ambulatory Visit (HOSPITAL_BASED_OUTPATIENT_CLINIC_OR_DEPARTMENT_OTHER): Payer: Medicare Other

## 2016-03-03 VITALS — BP 178/85 | HR 63 | Temp 98.7°F | Resp 20

## 2016-03-03 DIAGNOSIS — N6320 Unspecified lump in the left breast, unspecified quadrant: Secondary | ICD-10-CM

## 2016-03-03 DIAGNOSIS — C50411 Malignant neoplasm of upper-outer quadrant of right female breast: Secondary | ICD-10-CM | POA: Diagnosis present

## 2016-03-03 DIAGNOSIS — Z5189 Encounter for other specified aftercare: Secondary | ICD-10-CM

## 2016-03-03 MED ORDER — TBO-FILGRASTIM 300 MCG/0.5ML ~~LOC~~ SOSY
300.0000 ug | PREFILLED_SYRINGE | Freq: Once | SUBCUTANEOUS | Status: AC
  Administered 2016-03-03: 300 ug via SUBCUTANEOUS
  Filled 2016-03-03: qty 0.5

## 2016-03-03 NOTE — Patient Instructions (Signed)

## 2016-03-15 NOTE — Progress Notes (Signed)
Patient ID: Janice Powell, female   DOB: May 10, 1946, 70 y.o.   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 Harvell noted a mass in her right breast sometime in 2011. 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 More recently her husband twisted her arm to get the mass looked at, and she brought it to Dr. Pennie Banter attention. Diagnostic mammography and right ultrasonography at [facility] 03/02/2012 showed a 6.78 cm irregular mass in the inner aspect of the right breast. There was nipple retraction and erythema around the nipple. The largest lymph node was noted in the right axilla. By ultrasound the large irregular hypoechoic mass was noted in the breast with multiple enlarged axillary lymph nodes. Physical exam confirmed a hard breast with erythema around the right nipple extending inferiorly. The nipple is slightly inverted.  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   Her subsequent history is as detailed below  INTERVAL HISTORY: [name] returns today for follow-up of her triple negative breast cancer. Today is day 1 cycle 4 of carboplatin and gemcitabine which are given days 1 and 8 of each 21 day cycle. 8 cycle 2 was held because of low counts.   REVIEW OF SYSTEMS: Dorise tells me her blood pressure at home is "good". She just gets very anxious when she comes here, she says. She tolerates the current chemotherapy "really good", and is very pleased that she has not lost her hair. She says she would not be willing to go back to [place]  under any circumstances. She has had no nausea or vomiting, no mouth sores, and her energy is good. She has some itching around her left breast area and she has some pain across her chest at times. A detailed review of systems was otherwise stable  PAST MEDICAL HISTORY: Past Medical History:  Diagnosis Date  . Breast cancer ([place]) 03/08/13   right breast - Invasive Ductal Carcinoma   . GERD (gastroesophageal reflux disease)    occ  . Hx antineoplastic chemotherapy    two cycles of neoadjuvant cyclophosphamide, docetaxel, doxorubicin ["TAC"] at standard doses with neulasta support; refused neulasta with cycles 2 and 4; tolerated treatment poorly (b) docetaxel was held for final 4 cycles; completed cycle 6 neoadjuvant chemotherapy 12/27/2012    . Hypertension   . Osteopenia   . Pneumonia    hx  . Primary malignant neoplasm of left breast with stage 2 nodal metastasis per American Joint Committee on Cancer 7th edition (N2) ([place]) 01/09/2016  . S/P radiation therapy    1) Right Chest Wall and IM nodes / 50 Gy in 25 fractions /2) Right Supraclavicular fossa / 50 Gy in 25 fractions/1) Right Chest Wall and IM nodes / 50 Gy in 25 fractions /2) Right Supraclavicular fossa / 50 Gy in 25 fractions/3) Right Posterior Axillary boost / 6.55 Gy in 25 fractions/4) Right Chest Wall Scar boost / 10 Gy in 5 fractions      . Wears glasses   GERD osteopenia  PAST SURGICAL HISTORY: Past Surgical History:  Procedure Laterality Date  .  APPENDECTOMY  1992  . BREAST BIOPSY Right   . CHOLECYSTECTOMY  2000  . MASTECTOMY Right 03/13/2013  . MASTECTOMY MODIFIED RADICAL Right 03/13/2013   Procedure: MASTECTOMY MODIFIED RADICAL;  Surgeon: [name], MD;  Location: [facility];  Service: General;  Laterality: Right;  . PORT-A-CATH REMOVAL  03/13/2013  . PORT-A-CATH REMOVAL Left 03/13/2013   Procedure: REMOVAL PORT-A-CATH;  Surgeon: [name], MD;  Location: [facility];  Service: General;  Laterality: Left;  . PORTACATH  PLACEMENT  08/15/2012   Procedure: INSERTION PORT-A-CATH;  Surgeon: [name], MD;  Location: [facility];  Service: General;  Laterality: Left;  . PORTACATH PLACEMENT N/A 01/09/2016   Procedure: INSERTION PORT-A-CATH;  Surgeon: [name], MD;  Location: [facility];  Service: General;  Laterality: N/A;  . TUBAL LIGATION  1979    FAMILY HISTORY Family History  Problem Relation Age of Onset  . Heart disease Mother    the patient's father died in his sleep at age 41. The patient's mother died at the age of 61 from a myocardial infarction. The patient has one brother and 2 sisters, all surviving. There is no history of breast or ovarian cancer in the family.  GYNECOLOGIC HISTORY:  (Reviewed 12/07/2013) Menarche age 71, first live birth age 41, she is GX P5, menopause age 89. She did not take hormone replacement.  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 Her husband [name] worked in the post office 37 years, and is also now retired. He is a former Company secretary. Son [name] lives in [place] and manages an [employer] store. Daughter Cayley Dugo also lives in [place] and manages the IT Department at [employer]. Son Audi Higuchi III is a Engineer, materials. Daughter [name] is a Actuary for [employer]. Son [name] is an Actuary. The patient has 8 grandchildren. She attends [employer]   ADVANCED DIRECTIVES: Not in place  HEALTH MAINTENANCE:  (Updated 12/07/2013) Social History  Substance Use Topics  . Smoking status: Never Smoker  . Smokeless tobacco: Never Used  . Alcohol use No     Colonoscopy: Not on file  PAP: Not on file  Bone density:  January 2011   Lipid panel:  Not on file/ Dr. Shelia Media  No Known Allergies  Current Outpatient Prescriptions  Medication Sig Dispense Refill  . dexamethasone (DECADRON) 4 MG tablet Take 2 tablets (8 mg total) by mouth daily. Start the day after chemotherapy  for 2 days. Take with food. 30 tablet 1  . HYDROcodone-acetaminophen (NORCO) 5-325 MG tablet Take 1-2 tablets by mouth every 6 (six) hours as needed. 120 tablet 0  . lidocaine-prilocaine (EMLA) cream Apply to affected area once 30 g 3  . lisinopril (PRINIVIL,ZESTRIL) 10 MG tablet TAKE 1 TABLET EVERY DAY 90 tablet 1  . LORazepam (ATIVAN) 0.5 MG tablet Take 1 tablet (0.5 mg total) by mouth at bedtime as needed (Nausea or vomiting). 30 tablet 0  . metoprolol succinate (TOPROL-XL) 50 MG 24 hr tablet TAKE 1 TABLET BY MOUTH EVERY DAY WITH OR IMMEDIATELY FOLLOWING A MEAL 90 tablet 0  . omega-3 acid ethyl esters (LOVAZA) 1 G capsule Take 1 g by mouth daily.     . potassium chloride (K-DUR) 10 MEQ tablet Take 1 tablet (10 mEq total) by mouth daily. 60 tablet 5  . prochlorperazine (COMPAZINE) 10 MG tablet Take 1 tablet (10 mg total) by mouth every 6 (six)  hours as needed (Nausea or vomiting). (Patient not taking: Reported on 03/02/2016) 30 tablet 1   No current facility-administered medications for this visit.     OBJECTIVE: Middle-aged African American womanIn no acute distress Vitals:   03/16/16 1008  BP: (!) 204/87  Pulse: 66  Resp: 18  Temp: 98.5 F (36.9 C)     Body mass index is 24.59 kg/m.    ECOG FS: 2 Filed Weights   03/16/16 1008  Weight: 141 lb (64 kg)    Sclerae unicteric, EOMs intact Oropharynx clear and moist No cervical or supraclavicular adenopathy Lungs no rales or rhonchi Heart regular rate and rhythm Abd soft, nontender, positive bowel sounds MSK no focal spinal tenderness, chronic left upper extremity lymphedema Neuro: nonfocal, well oriented, appropriate affect Breasts: The right breast is status post mastectomy. There is some erythema which is slightly palpable and and a 4 mm "bump" on the right side suspicious for skin involvement. On the left there is significant periareolar erythema, slightly more intense than prior. ([place]  photograph today would not  upload)     Photo of left breast 12/17/2015    LAB RESULTS: CBC    Component Value Date/Time   WBC 4.2 03/16/2016 0957   WBC 6.0 01/07/2016 1330   RBC 3.54 (L) 03/16/2016 0957   RBC 4.12 01/07/2016 1330   HGB 11.8 03/16/2016 0957   HCT 36.0 03/16/2016 0957   PLT 196 03/16/2016 0957   MCV 101.7 (H) 03/16/2016 0957   MCH 33.3 03/16/2016 0957   MCH 32.3 01/07/2016 1330   MCHC 32.8 03/16/2016 0957   MCHC 32.9 01/07/2016 1330   RDW 17.3 (H) 03/16/2016 0957   LYMPHSABS 1.1 03/16/2016 0957   MONOABS 0.7 03/16/2016 0957   EOSABS 0.0 03/16/2016 0957   BASOSABS 0.0 03/16/2016 0957    CMP     Component Value Date/Time   NA 143 03/16/2016 0957   K 3.6 03/16/2016 0957   CL 102 01/07/2016 1330   CL 107 01/11/2013 0853   CO2 29 03/16/2016 0957   GLUCOSE 101 03/16/2016 0957   GLUCOSE 123 (H) 01/11/2013 0853   BUN 8.0 03/16/2016 0957   CREATININE 0.8 03/16/2016 0957   CALCIUM 9.3 03/16/2016 0957   PROT 7.2 03/16/2016 0957   ALBUMIN 3.5 03/16/2016 0957   AST 17 03/16/2016 0957   ALT 15 03/16/2016 0957   ALKPHOS 62 03/16/2016 0957   BILITOT 0.60 03/16/2016 0957   GFRNONAA >60 01/07/2016 1330   GFRAA >60 01/07/2016 1330       STUDIES: No results found.  ASSESSMENT: 70 y.o. Hand woman   (1)  s/p Right breast upper outer quadrant and axillary node biopsy 03/08/2012 for a clinical T4, N1-2', stage IIIB invasive ductal carcinoma, grade 3, triple negative, with an MIB-1 of 93%.  (2) definitive staging and treatment delayed as patient canceled staging studies and tried alternative treatments; with eventual progression  (3) neoadjuvant chemotherapy started 08/23/12  (a) received two cycles of neoadjuvant cyclophosphamide, docetaxel, doxorubicin ["TAC"] at standard doses with neulasta support; refused neulasta with cycles 2 and 4; tolerated treatment poorly  (b) docetaxel was held for final 4 cycles; completed cycle 6 neoadjuvant chemotherapy 12/27/2012    (4) status  post right modified radical mastectomy 03/13/2013 showing no residual carcinoma in the breast, but nine of 9 axillary lymph nodes sampled involved with macro metastases (ypTX, ypN2, stage IIIA)-- repeat prognostic panel again triple negative  (5) radiation therapy completed 05/31/2013  (6) patient opted against reconstruction  METASTATIC DISEASE: 7) left axillary adenopathy noted on mammography June 2015, confirmed on CT scan November 2015  (a) patient refused further workup or treatment on multiple visits (see notes)  (8) chest CT 12/16/2015 shows progressive left axillary and subpectoral adenopathy, left upper extremity lymphedema, left breast soft tissue fullness and skin thickening  (a) left axillary lymph node biopsy 12/26/2015 confirms metastatic carcinoma, triple negative  (9) started gemcitabine/carboplatin 01/13/2016, repeated day 1 and day 8 of each 21 cycle   (a) Day 8 cycle 2 held because of low counts   PLAN: Irelynn is tolerating her chemotherapy well, and we are proceeding to cycle 4 today. I am setting her up for Neupogen next 2 days so that we hopefully can get in the day 8 treatment this cycle.  It is not clear to me that she is having a response--the left breast periareolar erythema seems a bit more intense possibly also slightly more extensive.  I am setting her up for repeat CT scan of the chest September 6. She will see me 2 days later. I expect at that time we will discuss changing treatment and I am hopeful she will be a candidate for the pembrolizumab study, since she is unlikely to accept significant stronger chemotherapy.  One fly in the ointment is her blood pressure. This has been difficult to control in the past although she says she is taking her blood pressure medications. If she does qualify for the study though I will proceed to treat that more aggressively assuming she agrees, so she would be able to participate.  She knows to call for any problems that may  develop before her next visit here.  Chauncey Cruel, MD    03/16/2016

## 2016-03-16 ENCOUNTER — Other Ambulatory Visit (HOSPITAL_BASED_OUTPATIENT_CLINIC_OR_DEPARTMENT_OTHER): Payer: Medicare Other

## 2016-03-16 ENCOUNTER — Telehealth: Payer: Self-pay | Admitting: Oncology

## 2016-03-16 ENCOUNTER — Ambulatory Visit (HOSPITAL_BASED_OUTPATIENT_CLINIC_OR_DEPARTMENT_OTHER): Payer: Medicare Other | Admitting: Oncology

## 2016-03-16 ENCOUNTER — Ambulatory Visit (HOSPITAL_BASED_OUTPATIENT_CLINIC_OR_DEPARTMENT_OTHER): Payer: Medicare Other

## 2016-03-16 VITALS — BP 204/87 | HR 66 | Temp 98.5°F | Resp 18 | Ht 63.5 in | Wt 141.0 lb

## 2016-03-16 VITALS — BP 209/74 | HR 55

## 2016-03-16 DIAGNOSIS — C773 Secondary and unspecified malignant neoplasm of axilla and upper limb lymph nodes: Secondary | ICD-10-CM

## 2016-03-16 DIAGNOSIS — Z5111 Encounter for antineoplastic chemotherapy: Secondary | ICD-10-CM | POA: Diagnosis present

## 2016-03-16 DIAGNOSIS — C50911 Malignant neoplasm of unspecified site of right female breast: Secondary | ICD-10-CM

## 2016-03-16 DIAGNOSIS — Z171 Estrogen receptor negative status [ER-]: Secondary | ICD-10-CM

## 2016-03-16 DIAGNOSIS — C50411 Malignant neoplasm of upper-outer quadrant of right female breast: Secondary | ICD-10-CM

## 2016-03-16 DIAGNOSIS — C50919 Malignant neoplasm of unspecified site of unspecified female breast: Secondary | ICD-10-CM

## 2016-03-16 DIAGNOSIS — N6320 Unspecified lump in the left breast, unspecified quadrant: Secondary | ICD-10-CM

## 2016-03-16 LAB — CBC WITH DIFFERENTIAL/PLATELET
BASO%: 0.2 % (ref 0.0–2.0)
BASOS ABS: 0 10*3/uL (ref 0.0–0.1)
EOS%: 0.7 % (ref 0.0–7.0)
Eosinophils Absolute: 0 10*3/uL (ref 0.0–0.5)
HEMATOCRIT: 36 % (ref 34.8–46.6)
HGB: 11.8 g/dL (ref 11.6–15.9)
LYMPH#: 1.1 10*3/uL (ref 0.9–3.3)
LYMPH%: 25.7 % (ref 14.0–49.7)
MCH: 33.3 pg (ref 25.1–34.0)
MCHC: 32.8 g/dL (ref 31.5–36.0)
MCV: 101.7 fL — ABNORMAL HIGH (ref 79.5–101.0)
MONO#: 0.7 10*3/uL (ref 0.1–0.9)
MONO%: 15.7 % — ABNORMAL HIGH (ref 0.0–14.0)
NEUT#: 2.4 10*3/uL (ref 1.5–6.5)
NEUT%: 57.7 % (ref 38.4–76.8)
PLATELETS: 196 10*3/uL (ref 145–400)
RBC: 3.54 10*6/uL — ABNORMAL LOW (ref 3.70–5.45)
RDW: 17.3 % — ABNORMAL HIGH (ref 11.2–14.5)
WBC: 4.2 10*3/uL (ref 3.9–10.3)

## 2016-03-16 LAB — COMPREHENSIVE METABOLIC PANEL
ALT: 15 U/L (ref 0–55)
ANION GAP: 8 meq/L (ref 3–11)
AST: 17 U/L (ref 5–34)
Albumin: 3.5 g/dL (ref 3.5–5.0)
Alkaline Phosphatase: 62 U/L (ref 40–150)
BUN: 8 mg/dL (ref 7.0–26.0)
CALCIUM: 9.3 mg/dL (ref 8.4–10.4)
CHLORIDE: 106 meq/L (ref 98–109)
CO2: 29 mEq/L (ref 22–29)
Creatinine: 0.8 mg/dL (ref 0.6–1.1)
EGFR: 82 mL/min/{1.73_m2} — AB (ref >90)
Glucose: 101 mg/dl (ref 70–140)
POTASSIUM: 3.6 meq/L (ref 3.5–5.1)
Sodium: 143 mEq/L (ref 136–145)
Total Bilirubin: 0.6 mg/dL (ref 0.20–1.20)
Total Protein: 7.2 g/dL (ref 6.4–8.3)

## 2016-03-16 MED ORDER — CARBOPLATIN CHEMO INJECTION 450 MG/45ML: 180.0000 mg | Freq: Once | INTRAVENOUS | Status: DC

## 2016-03-16 MED ORDER — SODIUM CHLORIDE 0.9 % IV SOLN
10.0000 mg | Freq: Once | INTRAVENOUS | Status: AC
  Administered 2016-03-16: 10 mg via INTRAVENOUS
  Filled 2016-03-16: qty 1

## 2016-03-16 MED ORDER — PALONOSETRON HCL INJECTION 0.25 MG/5ML
0.2500 mg | Freq: Once | INTRAVENOUS | Status: AC
  Administered 2016-03-16: 0.25 mg via INTRAVENOUS

## 2016-03-16 MED ORDER — PALONOSETRON HCL INJECTION 0.25 MG/5ML
INTRAVENOUS | Status: AC
  Filled 2016-03-16: qty 5

## 2016-03-16 MED ORDER — SODIUM CHLORIDE 0.9 % IV SOLN
Freq: Once | INTRAVENOUS | Status: AC
  Administered 2016-03-16: 11:00:00 via INTRAVENOUS

## 2016-03-16 MED ORDER — SODIUM CHLORIDE 0.9 % IV SOLN
160.0000 mg | Freq: Once | INTRAVENOUS | Status: AC
  Administered 2016-03-16: 160 mg via INTRAVENOUS
  Filled 2016-03-16: qty 16

## 2016-03-16 MED ORDER — SODIUM CHLORIDE 0.9 % IV SOLN
1400.0000 mg | Freq: Once | INTRAVENOUS | Status: AC
  Administered 2016-03-16: 1400 mg via INTRAVENOUS
  Filled 2016-03-16: qty 36.82

## 2016-03-16 MED ORDER — HEPARIN SOD (PORK) LOCK FLUSH 100 UNIT/ML IV SOLN
500.0000 [IU] | Freq: Once | INTRAVENOUS | Status: AC | PRN
  Administered 2016-03-16: 500 [IU]
  Filled 2016-03-16: qty 5

## 2016-03-16 MED ORDER — SODIUM CHLORIDE 0.9% FLUSH
10.0000 mL | INTRAVENOUS | Status: DC | PRN
  Administered 2016-03-16: 10 mL
  Filled 2016-03-16: qty 10

## 2016-03-16 NOTE — Telephone Encounter (Signed)
appt made and avs to print in treatment room °

## 2016-03-16 NOTE — Progress Notes (Signed)
Call placed to Dr. Jana Hakim, reviewed Blood Pressure 209/74, Ok to treat per MD.

## 2016-03-16 NOTE — Patient Instructions (Signed)
[facility] Discharge Instructions for Patients Receiving Chemotherapy  Today you received the following chemotherapy agents :  Gemzar/Carboplatin  To help prevent nausea and vomiting after your treatment, we encourage you to take your nausea medicationIf you develop nausea and vomiting that is not controlled by your nausea medication, call the clinic.   BELOW ARE SYMPTOMS THAT SHOULD BE REPORTED IMMEDIATELY:  *FEVER GREATER THAN 100.5 F  *CHILLS WITH OR WITHOUT FEVER  NAUSEA AND VOMITING THAT IS NOT CONTROLLED WITH YOUR NAUSEA MEDICATION  *UNUSUAL SHORTNESS OF BREATH  *UNUSUAL BRUISING OR BLEEDING  TENDERNESS IN MOUTH AND THROAT WITH OR WITHOUT PRESENCE OF ULCERS  *URINARY PROBLEMS  *BOWEL PROBLEMS  UNUSUAL RASH Items with * indicate a potential emergency and should be followed up as soon as possible.  Feel free to call the clinic you have any questions or concerns. The clinic phone number is [phone].  Please show the CHEMO ALERT CARD at check-in to the Emergency Department and triage nurse.   

## 2016-03-17 ENCOUNTER — Ambulatory Visit (HOSPITAL_BASED_OUTPATIENT_CLINIC_OR_DEPARTMENT_OTHER): Payer: Medicare Other

## 2016-03-17 VITALS — BP 170/74 | HR 63 | Temp 98.0°F

## 2016-03-17 DIAGNOSIS — N6320 Unspecified lump in the left breast, unspecified quadrant: Secondary | ICD-10-CM

## 2016-03-17 DIAGNOSIS — Z5189 Encounter for other specified aftercare: Secondary | ICD-10-CM

## 2016-03-17 DIAGNOSIS — C50411 Malignant neoplasm of upper-outer quadrant of right female breast: Secondary | ICD-10-CM | POA: Diagnosis present

## 2016-03-17 MED ORDER — FILGRASTIM 300 MCG/0.5ML IJ SOSY
300.0000 ug | PREFILLED_SYRINGE | Freq: Once | INTRAMUSCULAR | Status: AC
  Administered 2016-03-17: 300 ug via SUBCUTANEOUS
  Filled 2016-03-17: qty 0.5

## 2016-03-17 NOTE — Patient Instructions (Signed)

## 2016-03-18 ENCOUNTER — Ambulatory Visit (HOSPITAL_BASED_OUTPATIENT_CLINIC_OR_DEPARTMENT_OTHER): Payer: Medicare Other

## 2016-03-18 VITALS — BP 171/61 | HR 69 | Temp 98.2°F | Resp 18

## 2016-03-18 DIAGNOSIS — C50411 Malignant neoplasm of upper-outer quadrant of right female breast: Secondary | ICD-10-CM

## 2016-03-18 DIAGNOSIS — Z5189 Encounter for other specified aftercare: Secondary | ICD-10-CM | POA: Diagnosis not present

## 2016-03-18 DIAGNOSIS — N6320 Unspecified lump in the left breast, unspecified quadrant: Secondary | ICD-10-CM

## 2016-03-18 MED ORDER — TBO-FILGRASTIM 300 MCG/0.5ML ~~LOC~~ SOSY
300.0000 ug | PREFILLED_SYRINGE | Freq: Once | SUBCUTANEOUS | Status: AC
  Administered 2016-03-18: 300 ug via SUBCUTANEOUS
  Filled 2016-03-18: qty 0.5

## 2016-03-18 NOTE — Patient Instructions (Signed)

## 2016-03-20 ENCOUNTER — Other Ambulatory Visit: Payer: Self-pay

## 2016-03-23 ENCOUNTER — Other Ambulatory Visit (HOSPITAL_BASED_OUTPATIENT_CLINIC_OR_DEPARTMENT_OTHER): Payer: Medicare Other

## 2016-03-23 ENCOUNTER — Ambulatory Visit (HOSPITAL_BASED_OUTPATIENT_CLINIC_OR_DEPARTMENT_OTHER): Payer: Medicare Other

## 2016-03-23 VITALS — BP 209/65 | HR 65 | Temp 98.2°F | Resp 18

## 2016-03-23 DIAGNOSIS — Z5111 Encounter for antineoplastic chemotherapy: Secondary | ICD-10-CM

## 2016-03-23 DIAGNOSIS — C50411 Malignant neoplasm of upper-outer quadrant of right female breast: Secondary | ICD-10-CM

## 2016-03-23 DIAGNOSIS — N6320 Unspecified lump in the left breast, unspecified quadrant: Secondary | ICD-10-CM

## 2016-03-23 DIAGNOSIS — C50919 Malignant neoplasm of unspecified site of unspecified female breast: Secondary | ICD-10-CM

## 2016-03-23 LAB — CBC WITH DIFFERENTIAL/PLATELET
BASO%: 0.3 % (ref 0.0–2.0)
BASOS ABS: 0 10*3/uL (ref 0.0–0.1)
EOS ABS: 0 10*3/uL (ref 0.0–0.5)
EOS%: 0.4 % (ref 0.0–7.0)
HEMATOCRIT: 33.1 % — AB (ref 34.8–46.6)
HGB: 10.8 g/dL — ABNORMAL LOW (ref 11.6–15.9)
LYMPH#: 1.1 10*3/uL (ref 0.9–3.3)
LYMPH%: 38.1 % (ref 14.0–49.7)
MCH: 33.3 pg (ref 25.1–34.0)
MCHC: 32.6 g/dL (ref 31.5–36.0)
MCV: 102.2 fL — AB (ref 79.5–101.0)
MONO#: 0.4 10*3/uL (ref 0.1–0.9)
MONO%: 13.3 % (ref 0.0–14.0)
NEUT#: 1.4 10*3/uL — ABNORMAL LOW (ref 1.5–6.5)
NEUT%: 47.9 % (ref 38.4–76.8)
PLATELETS: 107 10*3/uL — AB (ref 145–400)
RBC: 3.24 10*6/uL — AB (ref 3.70–5.45)
RDW: 17.3 % — ABNORMAL HIGH (ref 11.2–14.5)
WBC: 2.9 10*3/uL — ABNORMAL LOW (ref 3.9–10.3)

## 2016-03-23 LAB — COMPREHENSIVE METABOLIC PANEL
ALT: 22 U/L (ref 0–55)
ANION GAP: 10 meq/L (ref 3–11)
AST: 18 U/L (ref 5–34)
Albumin: 3.5 g/dL (ref 3.5–5.0)
Alkaline Phosphatase: 63 U/L (ref 40–150)
BUN: 12.9 mg/dL (ref 7.0–26.0)
CHLORIDE: 104 meq/L (ref 98–109)
CO2: 29 meq/L (ref 22–29)
CREATININE: 0.8 mg/dL (ref 0.6–1.1)
Calcium: 9.2 mg/dL (ref 8.4–10.4)
EGFR: 87 mL/min/{1.73_m2} — AB (ref >90)
Glucose: 121 mg/dl (ref 70–140)
Potassium: 3.4 mEq/L — ABNORMAL LOW (ref 3.5–5.1)
Sodium: 143 mEq/L (ref 136–145)
Total Bilirubin: 0.53 mg/dL (ref 0.20–1.20)
Total Protein: 6.9 g/dL (ref 6.4–8.3)

## 2016-03-23 MED ORDER — PALONOSETRON HCL INJECTION 0.25 MG/5ML
0.2500 mg | Freq: Once | INTRAVENOUS | Status: AC
  Administered 2016-03-23: 0.25 mg via INTRAVENOUS

## 2016-03-23 MED ORDER — PALONOSETRON HCL INJECTION 0.25 MG/5ML
INTRAVENOUS | Status: AC
Start: 2016-03-23 — End: 2016-03-23
  Filled 2016-03-23: qty 5

## 2016-03-23 MED ORDER — SODIUM CHLORIDE 0.9 % IV SOLN
800.0000 mg/m2 | Freq: Once | INTRAVENOUS | Status: AC
  Administered 2016-03-23: 1368 mg via INTRAVENOUS
  Filled 2016-03-23: qty 35.98

## 2016-03-23 MED ORDER — SODIUM CHLORIDE 0.9 % IV SOLN
10.0000 mg | Freq: Once | INTRAVENOUS | Status: AC
  Administered 2016-03-23: 10 mg via INTRAVENOUS
  Filled 2016-03-23: qty 1

## 2016-03-23 MED ORDER — SODIUM CHLORIDE 0.9 % IV SOLN
Freq: Once | INTRAVENOUS | Status: AC
  Administered 2016-03-23: 12:00:00 via INTRAVENOUS

## 2016-03-23 MED ORDER — SODIUM CHLORIDE 0.9 % IV SOLN
160.0000 mg | Freq: Once | INTRAVENOUS | Status: AC
  Administered 2016-03-23: 160 mg via INTRAVENOUS
  Filled 2016-03-23: qty 16

## 2016-03-23 MED ORDER — SODIUM CHLORIDE 0.9% FLUSH
10.0000 mL | INTRAVENOUS | Status: DC | PRN
  Administered 2016-03-23: 10 mL
  Filled 2016-03-23: qty 10

## 2016-03-23 MED ORDER — HEPARIN SOD (PORK) LOCK FLUSH 100 UNIT/ML IV SOLN
500.0000 [IU] | Freq: Once | INTRAVENOUS | Status: AC | PRN
  Administered 2016-03-23: 500 [IU]
  Filled 2016-03-23: qty 5

## 2016-03-23 NOTE — Progress Notes (Signed)
Okay to treat today despite Neut = 1.4, per Dr. Jana Hakim.

## 2016-03-23 NOTE — Patient Instructions (Signed)
[facility] Discharge Instructions for Patients Receiving Chemotherapy  Today you received the following chemotherapy agents :  Gemzar/Carboplatin  To help prevent nausea and vomiting after your treatment, we encourage you to take your nausea medicationIf you develop nausea and vomiting that is not controlled by your nausea medication, call the clinic.   BELOW ARE SYMPTOMS THAT SHOULD BE REPORTED IMMEDIATELY:  *FEVER GREATER THAN 100.5 F  *CHILLS WITH OR WITHOUT FEVER  NAUSEA AND VOMITING THAT IS NOT CONTROLLED WITH YOUR NAUSEA MEDICATION  *UNUSUAL SHORTNESS OF BREATH  *UNUSUAL BRUISING OR BLEEDING  TENDERNESS IN MOUTH AND THROAT WITH OR WITHOUT PRESENCE OF ULCERS  *URINARY PROBLEMS  *BOWEL PROBLEMS  UNUSUAL RASH Items with * indicate a potential emergency and should be followed up as soon as possible.  Feel free to call the clinic you have any questions or concerns. The clinic phone number is [phone].  Please show the CHEMO ALERT CARD at check-in to the Emergency Department and triage nurse.   

## 2016-03-23 NOTE — Progress Notes (Signed)
Ok to treat with blood pressure of 204/65 per Dr. Jana Hakim.

## 2016-04-03 ENCOUNTER — Telehealth: Payer: Self-pay

## 2016-04-03 ENCOUNTER — Telehealth: Payer: Self-pay | Admitting: Oncology

## 2016-04-03 ENCOUNTER — Ambulatory Visit: Payer: Medicare Other | Admitting: Oncology

## 2016-04-03 ENCOUNTER — Other Ambulatory Visit: Payer: Self-pay

## 2016-04-03 MED ORDER — HYDROCODONE-ACETAMINOPHEN 5-325 MG PO TABS: 1.0000 | ORAL_TABLET | Freq: Four times a day (QID) | ORAL | 0 refills | Status: DC | PRN

## 2016-04-03 NOTE — Telephone Encounter (Signed)
04/03/2016 Appointment canceled per patient request.

## 2016-04-03 NOTE — Telephone Encounter (Signed)
Pt called stating she needed to r/s her CT. She is asking if she needs to switcher her MD to after the CT. S/w Dr Jana Hakim and in basket sent for MD appt after CT. Made pt aware not to come in today and expect call from scheduler

## 2016-04-09 ENCOUNTER — Ambulatory Visit (HOSPITAL_COMMUNITY)
Admission: RE | Admit: 2016-04-09 | Discharge: 2016-04-09 | Disposition: A | Discharge status: Home / Self Care | Payer: Medicare Other | Source: Ambulatory Visit | Attending: Oncology | Admitting: Oncology

## 2016-04-09 ENCOUNTER — Telehealth: Payer: Self-pay | Admitting: *Deleted

## 2016-04-09 DIAGNOSIS — C50411 Malignant neoplasm of upper-outer quadrant of right female breast: Secondary | ICD-10-CM | POA: Diagnosis not present

## 2016-04-09 DIAGNOSIS — Z853 Personal history of malignant neoplasm of breast: Secondary | ICD-10-CM | POA: Diagnosis not present

## 2016-04-09 DIAGNOSIS — C50911 Malignant neoplasm of unspecified site of right female breast: Secondary | ICD-10-CM

## 2016-04-09 DIAGNOSIS — C773 Secondary and unspecified malignant neoplasm of axilla and upper limb lymph nodes: Secondary | ICD-10-CM | POA: Diagnosis not present

## 2016-04-09 MED ORDER — IOPAMIDOL (ISOVUE-300) INJECTION 61%
75.0000 mL | Freq: Once | INTRAVENOUS | Status: AC | PRN
  Administered 2016-04-09: 75 mL via INTRAVENOUS

## 2016-04-09 NOTE — Telephone Encounter (Signed)
[place] called with results from today's CT scan.  Will take report to Dr. Jana Hakim for his review.

## 2016-04-13 ENCOUNTER — Ambulatory Visit (HOSPITAL_BASED_OUTPATIENT_CLINIC_OR_DEPARTMENT_OTHER): Payer: Medicare Other | Admitting: Oncology

## 2016-04-13 VITALS — BP 189/91 | HR 68 | Temp 98.2°F | Resp 18 | Ht 63.5 in | Wt 141.1 lb

## 2016-04-13 DIAGNOSIS — Z171 Estrogen receptor negative status [ER-]: Secondary | ICD-10-CM | POA: Diagnosis not present

## 2016-04-13 DIAGNOSIS — C773 Secondary and unspecified malignant neoplasm of axilla and upper limb lymph nodes: Secondary | ICD-10-CM

## 2016-04-13 DIAGNOSIS — C50411 Malignant neoplasm of upper-outer quadrant of right female breast: Secondary | ICD-10-CM

## 2016-04-13 DIAGNOSIS — C50911 Malignant neoplasm of unspecified site of right female breast: Secondary | ICD-10-CM

## 2016-04-13 DIAGNOSIS — I890 Lymphedema, not elsewhere classified: Secondary | ICD-10-CM | POA: Diagnosis not present

## 2016-04-13 MED ORDER — TRIAMTERENE-HCTZ 75-50 MG PO TABS: 1.0000 | ORAL_TABLET | Freq: Every day | ORAL | 6 refills | Status: AC

## 2016-04-13 NOTE — Progress Notes (Signed)
Patient ID: Janice Powell, female   DOB: July 05, 1946, 70 y.o.   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 noted a mass in her right breast sometime in 2011. 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 More recently her husband twisted her arm to get the mass looked at, and she brought it to Dr. Pennie Banter attention. Diagnostic mammography and right ultrasonography at [facility] 03/02/2012 showed a 6.78 cm irregular mass in the inner aspect of the right breast. There was nipple retraction and erythema around the nipple. The largest lymph node was noted in the right axilla. By ultrasound the large irregular hypoechoic mass was noted in the breast with multiple enlarged axillary lymph nodes. Physical exam confirmed a hard breast with erythema around the right nipple extending inferiorly. The nipple is slightly inverted.  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   Her subsequent history is as detailed below  INTERVAL HISTORY: [name] returns today for follow-up of her breast cancer, which is triple negative. She was supposed to have seen me September 8 but canceled that appointment.  Since her last visit here she had a repeat CT of the chest which shows some evidence of response.  Her last treatment was 03/23/2016. She had been due for for a day 8 treatment 03/30/2016 but for some reason that did not happen.   She is here today to review results of her restaging studies and also to discuss management of her blood pressure. Note that  if we cannot control her blood pressure better she will not be a candidate for the pembrolizumab study.  REVIEW OF SYSTEMS: As far as her blood pressure is controlled the patient is on lisinopril and metoprolol. She tells me she does take your medications every day. She says her blood pressure is only high here and it is not high at home but other doctor offices. That is possible since coming here is very anxiety provoking to her. As far as chemotherapy side effects are concerned she is tolerating treatment with no mouth sores, nausea, vomiting, or other obvious problems. She feels her left upper extremity lymphedema is improved. A detailed review of systems today was stable.  PAST MEDICAL HISTORY: Past Medical History:  Diagnosis Date  . Breast cancer ([place]) 03/08/13   right breast - Invasive Ductal Carcinoma   . GERD (gastroesophageal reflux disease)    occ  . Hx antineoplastic chemotherapy    two cycles of neoadjuvant cyclophosphamide, docetaxel, doxorubicin ["TAC"] at standard doses with neulasta support; refused neulasta with cycles 2 and 4; tolerated treatment poorly (b) docetaxel was held for final 4 cycles; completed cycle 6 neoadjuvant chemotherapy 12/27/2012    . Hypertension   . Osteopenia   . Pneumonia    hx  . Primary malignant neoplasm of left breast with stage 2 nodal metastasis per American Joint Committee on Cancer 7th edition (N2) ([place]) 01/09/2016  . S/P radiation therapy    1) Right Chest Wall and IM nodes / 50 Gy in 25 fractions /2) Right Supraclavicular fossa / 50 Gy  in 25 fractions/1) Right Chest Wall and IM nodes / 50 Gy in 25 fractions /2) Right Supraclavicular fossa / 50 Gy in 25 fractions/3) Right Posterior Axillary boost / 6.55 Gy in 25 fractions/4) Right Chest Wall Scar boost / 10 Gy in 5 fractions      . Wears glasses   GERD osteopenia  PAST SURGICAL HISTORY: Past Surgical History:  Procedure Laterality Date  . APPENDECTOMY  1992  . BREAST BIOPSY Right   .  CHOLECYSTECTOMY  2000  . MASTECTOMY Right 03/13/2013  . MASTECTOMY MODIFIED RADICAL Right 03/13/2013   Procedure: MASTECTOMY MODIFIED RADICAL;  Surgeon: [name], MD;  Location: [facility];  Service: General;  Laterality: Right;  . PORT-A-CATH REMOVAL  03/13/2013  . PORT-A-CATH REMOVAL Left 03/13/2013   Procedure: REMOVAL PORT-A-CATH;  Surgeon: [name], MD;  Location: [facility];  Service: General;  Laterality: Left;  . PORTACATH PLACEMENT  08/15/2012   Procedure: INSERTION PORT-A-CATH;  Surgeon: [name], MD;  Location: [facility];  Service: General;  Laterality: Left;  . PORTACATH PLACEMENT N/A 01/09/2016   Procedure: INSERTION PORT-A-CATH;  Surgeon: [name], MD;  Location: [facility];  Service: General;  Laterality: N/A;  . TUBAL LIGATION  1979    FAMILY HISTORY Family History  Problem Relation Age of Onset  . Heart disease Mother    the patient's father died in his sleep at age 74. The patient's mother died at the age of 34 from a myocardial infarction. The patient has one brother and 2 sisters, all surviving. There is no history of breast or ovarian cancer in the family.  GYNECOLOGIC HISTORY:  (Reviewed 12/07/2013) Menarche age 28, first live birth age 74, she is GX P5, menopause age 6. She did not take hormone replacement.  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 Her husband [name] worked in the post office 60 years, and is also now retired. He is a former Company secretary. Son [name] lives in [place] and manages an [employer] store. Daughter Mary-Jane Hewell also lives in [place] and manages the IT Department at [employer]. Son Okema Desjardin III is a Engineer, materials. Daughter [name] is a Actuary for [employer]. Son [name] is an Actuary. The patient has 8 grandchildren. She attends [employer]   ADVANCED DIRECTIVES: Not in place  HEALTH MAINTENANCE:  (Updated 12/07/2013) Social  History  Substance Use Topics  . Smoking status: Never Smoker  . Smokeless tobacco: Never Used  . Alcohol use No     Colonoscopy: Not on file  PAP: Not on file  Bone density:  January 2011   Lipid panel:  Not on file/ Dr. Shelia Media  No Known Allergies  Current Outpatient Prescriptions  Medication Sig Dispense Refill  . dexamethasone (DECADRON) 4 MG tablet Take 2 tablets (8 mg total) by mouth daily. Start the day after chemotherapy for 2 days. Take with food. 30 tablet 1  . HYDROcodone-acetaminophen (NORCO) 5-325 MG tablet Take 1-2 tablets by mouth every 6 (six) hours as needed. 120 tablet 0  . lidocaine-prilocaine (EMLA) cream Apply to affected area once 30 g 3  . lisinopril (PRINIVIL,ZESTRIL) 10 MG tablet TAKE 1 TABLET EVERY DAY 90 tablet 1  . LORazepam (ATIVAN) 0.5 MG tablet Take 1 tablet (0.5 mg total) by mouth at bedtime as needed (Nausea or vomiting). 30 tablet 0  . metoprolol succinate (TOPROL-XL) 50 MG 24 hr tablet TAKE 1  TABLET BY MOUTH EVERY DAY WITH OR IMMEDIATELY FOLLOWING A MEAL 90 tablet 0  . omega-3 acid ethyl esters (LOVAZA) 1 G capsule Take 1 g by mouth daily.     . potassium chloride (K-DUR) 10 MEQ tablet Take 1 tablet (10 mEq total) by mouth daily. 60 tablet 5  . prochlorperazine (COMPAZINE) 10 MG tablet Take 1 tablet (10 mg total) by mouth every 6 (six) hours as needed (Nausea or vomiting). (Patient not taking: Reported on 03/02/2016) 30 tablet 1  . triamterene-hydrochlorothiazide (MAXZIDE) 75-50 MG tablet Take 1 tablet by mouth daily. 90 tablet 6   No current facility-administered medications for this visit.     OBJECTIVE: Middle-aged [place] American woman who appears stated age 38:   04/13/16 0940  BP: (!) 189/91  Pulse: 68  Resp: 18  Temp: 98.2 F (36.8 C)     Body mass index is 24.6 kg/m.    ECOG FS: 1 Filed Weights   04/13/16 0940  Weight: 141 lb 1.6 oz (64 kg)    Sclerae unicteric, pupils round and equal Oropharynx clear and moist-- no thrush or  other lesions No cervical or supraclavicular adenopathy Lungs no rales or rhonchi Heart regular rate and rhythm Abd soft, nontender, positive bowel sounds MSK no focal spinal tenderness, left upper extremity lymphedema appears slightly diminished Neuro: nonfocal, well oriented, appropriate affect Breasts: The left breast shows central erythema, which appears to be slightly progressive as compared to prior. Photo of that separately scanned   Photo of left breast 12/17/2015    LAB RESULTS: CBC    Component Value Date/Time   WBC 2.9 (L) 03/23/2016 1056   WBC 6.0 01/07/2016 1330   RBC 3.24 (L) 03/23/2016 1056   RBC 4.12 01/07/2016 1330   HGB 10.8 (L) 03/23/2016 1056   HCT 33.1 (L) 03/23/2016 1056   PLT 107 (L) 03/23/2016 1056   MCV 102.2 (H) 03/23/2016 1056   MCH 33.3 03/23/2016 1056   MCH 32.3 01/07/2016 1330   MCHC 32.6 03/23/2016 1056   MCHC 32.9 01/07/2016 1330   RDW 17.3 (H) 03/23/2016 1056   LYMPHSABS 1.1 03/23/2016 1056   MONOABS 0.4 03/23/2016 1056   EOSABS 0.0 03/23/2016 1056   BASOSABS 0.0 03/23/2016 1056    CMP     Component Value Date/Time   NA 143 03/23/2016 1056   K 3.4 (L) 03/23/2016 1056   CL 102 01/07/2016 1330   CL 107 01/11/2013 0853   CO2 29 03/23/2016 1056   GLUCOSE 121 03/23/2016 1056   GLUCOSE 123 (H) 01/11/2013 0853   BUN 12.9 03/23/2016 1056   CREATININE 0.8 03/23/2016 1056   CALCIUM 9.2 03/23/2016 1056   PROT 6.9 03/23/2016 1056   ALBUMIN 3.5 03/23/2016 1056   AST 18 03/23/2016 1056   ALT 22 03/23/2016 1056   ALKPHOS 63 03/23/2016 1056   BILITOT 0.53 03/23/2016 1056   GFRNONAA >60 01/07/2016 1330   GFRAA >60 01/07/2016 1330       STUDIES: Ct Chest W Contrast  Result Date: 04/09/2016 CLINICAL DATA:  LEFT-sided breast cancer diagnosed 2007 team. Chemotherapy and radiation therapy complete. Prior history of RIGHT breast cancer EXAM: CT CHEST WITH CONTRAST TECHNIQUE: Multidetector CT imaging of the chest was performed during  intravenous contrast administration. CONTRAST:  58mL ISOVUE-300 IOPAMIDOL (ISOVUE-300) INJECTION 61% COMPARISON:  CT 12/16/2015 FINDINGS: Cardiovascular: Coronary artery calcification. Mediastinum/Nodes: LEFT axillary adenopathy is decreased in volume. For example 22 mm rounded LEFT axial lymph node is decreased from 26 mm. More inferior 12  mm lymph node decreased from 23 mm. Decreased in prominence of the high left sub pectoralis lymph nodes seen on comparison exam. No RIGHT axillary adenopathy. RIGHT axillary lymphadenectomy clips noted. No supraclavicular adenopathy. A LEFT infraclavicular lymph node measuring 7 mm (18/2) adjacent to the inferior jugular vein compares to 5 mm. In the mediastinum, no lymphadenopathy. No enlarged internal mammary lymph nodes. Lungs/Pleura: Postradiation change in the periphery of the RIGHT upper lobe. Within the lower lobe on the LEFT, nodular consolidation measuring 2.6 cm with peripheral fine airspace disease is new from prior. Upper Abdomen: Limited view of the liver, kidneys, pancreas are unremarkable. Normal adrenal glands. Musculoskeletal: No aggressive osseous lesion. IMPRESSION: 1. Decrease in size of metastatic LEFT axillary lymphadenopathy. 2. One LEFT infraclavicular lymph nodes is enlarged. 3. New focus of peripheral consolidation in the LEFT lower lobe is most suggestive of a focus of infection or infarction. Neoplasm is not favored. No central pulmonary embolism identified. These results will be called to the ordering clinician or representative by the Radiologist Assistant, and communication documented in the PACS or zVision Dashboard. Electronically Signed   By: Suzy Bouchard M.D.   On: 04/09/2016 14:13    ASSESSMENT: 70 y.o. [place] woman   (1)  s/p Right breast upper outer quadrant and axillary node biopsy 03/08/2012 for a clinical T4, N1-2', stage IIIB invasive ductal carcinoma, grade 3, triple negative, with an MIB-1 of 93%.  (2) definitive staging  and treatment delayed as patient canceled staging studies and tried alternative treatments; with eventual progression  (3) neoadjuvant chemotherapy started 08/23/12  (a) received two cycles of neoadjuvant cyclophosphamide, docetaxel, doxorubicin ["TAC"] at standard doses with neulasta support; refused neulasta with cycles 2 and 4; tolerated treatment poorly  (b) docetaxel was held for final 4 cycles; completed cycle 6 neoadjuvant chemotherapy 12/27/2012    (4) status post right modified radical mastectomy 03/13/2013 showing no residual carcinoma in the breast, but nine of 9 axillary lymph nodes sampled involved with macro metastases (ypTX, ypN2, stage IIIA)-- repeat prognostic panel again triple negative  (5) radiation therapy completed 05/31/2013  (6) patient opted against reconstruction  METASTATIC DISEASE: (7) left axillary adenopathy noted on mammography June 2015, confirmed on CT scan November 2015  (a) patient refused further workup or treatment on multiple visits (see notes)  (8) chest CT 12/16/2015 shows progressive left axillary and subpectoral adenopathy, left upper extremity lymphedema, left breast soft tissue fullness and skin thickening  (a) left axillary lymph node biopsy 12/26/2015 confirms metastatic carcinoma, triple negative  (9) started gemcitabine/carboplatin 01/13/2016, repeated day 1 and day 8 of each 21 cycle   (a) Day 8 cycle 2 held because of low counts  (b) restaging CT scan of the chest 04/09/2016 after 6 cycles of chemotherapy shows evidence of mixed response  (10) poorly controlled hypertension: Maxide added to lisinopril and metoprolol 04/13/2016  (11) left upper extremity lymphedema  PLAN: I spent approximately 30 minutes today with left going over her scan. It shows that her left axillary adenopathy is diminished. However inspection of the breast looks worse to me today. (I was not able to import the image since haiku is not working appropriately. I did  take a photo and this will be scanned separately).  Which she would like to do at this point is proceed to surgery. I am asking Dr. Dalbert Batman to review her exam and CT scan to see if he thinks the left modified radical mastectomy as possible. This would not address the left infraclavicular lymph  node, which may or may not be malignant, but she would certainly have post surgical radiation. Hopefully that will improve local control  If she is not a candidate for surgery then she will return next week and will have more chemotherapy. However it is not clear to me that she would agree to that. For that reason if surgery is possible at this point I would favor that option  She knows to call for any problems that may develop before her next visit here.  Chauncey Cruel, MD    04/13/2016

## 2016-04-19 NOTE — Progress Notes (Signed)
Patient ID: Janice Powell, female   DOB: June 21, 1946, 70 y.o.   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 Bartolini noted a mass in her right breast sometime in 2011. 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 More recently her husband twisted her arm to get the mass looked at, and she brought it to Dr. Pennie Banter attention. Diagnostic mammography and right ultrasonography at [facility] 03/02/2012 showed a 6.78 cm irregular mass in the inner aspect of the right breast. There was nipple retraction and erythema around the nipple. The largest lymph node was noted in the right axilla. By ultrasound the large irregular hypoechoic mass was noted in the breast with multiple enlarged axillary lymph nodes. Physical exam confirmed a hard breast with erythema around the right nipple extending inferiorly. The nipple is slightly inverted.  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   Her subsequent history is as detailed below  INTERVAL HISTORY: Janice Powell returns today for follow-up of her left-sided triple negative breast cancer. After her last visit here we set her up to see Dr. Dalbert Batman for consideration of surgery. However she did not show. She tells me that she decided she wanted more chemotherapy treatment at this point. Accordingly she is due for treatment today.  We have been trying to give her carboplatin and Gemzar days 1 and 8 of each 21 day cycle, but there have been many delays due to low counts and today we consider switching to every 2  weeks.  REVIEW OF SYSTEMS: She tells me she is taking the additional blood pressure medication and that it is making her feel more tired. She is still very active in preaching and is also in a partial fast before the next 3 weeks because of her church calendar. She has tenderness in the left breast and there is a "bump" on it which is particularly tender she says. Otherwise a detailed review of systems today was stable  PAST MEDICAL HISTORY: Past Medical History:  Diagnosis Date  . Breast cancer ([place]) 03/08/13   right breast - Invasive Ductal Carcinoma   . GERD (gastroesophageal reflux disease)    occ  . Hx antineoplastic chemotherapy    two cycles of neoadjuvant cyclophosphamide, docetaxel, doxorubicin ["TAC"] at standard doses with neulasta support; refused neulasta with cycles 2 and 4; tolerated treatment poorly (b) docetaxel was held for final 4 cycles; completed cycle 6 neoadjuvant chemotherapy 12/27/2012    . Hypertension   . Osteopenia   . Pneumonia    hx  . Primary malignant neoplasm of left breast with stage 2 nodal metastasis per American Joint Committee on Cancer 7th edition (N2) ([place]) 01/09/2016  . S/P radiation therapy    1) Right Chest Wall and IM nodes / 50 Gy in 25 fractions /2) Right Supraclavicular fossa / 50 Gy in 25 fractions/1) Right Chest Wall and IM nodes / 50 Gy in 25 fractions /2) Right Supraclavicular fossa / 50 Gy in 25 fractions/3) Right Posterior Axillary boost / 6.55 Gy in 25 fractions/4) Right Chest Wall Scar boost / 10  Gy in 5 fractions      . Wears glasses   GERD osteopenia  PAST SURGICAL HISTORY: Past Surgical History:  Procedure Laterality Date  . APPENDECTOMY  1992  . BREAST BIOPSY Right   . CHOLECYSTECTOMY  2000  . MASTECTOMY Right 03/13/2013  . MASTECTOMY MODIFIED RADICAL Right 03/13/2013   Procedure: MASTECTOMY MODIFIED RADICAL;  Surgeon: [name], MD;  Location: [facility];  Service: General;  Laterality: Right;  . PORT-A-CATH REMOVAL   03/13/2013  . PORT-A-CATH REMOVAL Left 03/13/2013   Procedure: REMOVAL PORT-A-CATH;  Surgeon: [name], MD;  Location: [facility];  Service: General;  Laterality: Left;  . PORTACATH PLACEMENT  08/15/2012   Procedure: INSERTION PORT-A-CATH;  Surgeon: [name], MD;  Location: [facility];  Service: General;  Laterality: Left;  . PORTACATH PLACEMENT N/A 01/09/2016   Procedure: INSERTION PORT-A-CATH;  Surgeon: [name], MD;  Location: [facility];  Service: General;  Laterality: N/A;  . TUBAL LIGATION  1979    FAMILY HISTORY Family History  Problem Relation Age of Onset  . Heart disease Mother    the patient's father died in his sleep at age 73. The patient's mother died at the age of 63 from a myocardial infarction. The patient has one brother and 2 sisters, all surviving. There is no history of breast or ovarian cancer in the family.  GYNECOLOGIC HISTORY:  (Reviewed 12/07/2013) Menarche age 66, first live birth age 58, she is GX P5, menopause age 31. She did not take hormone replacement.  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 Her husband [name] worked in the post office 46 years, and is also now retired. He is a former Company secretary. Son [name] lives in [place] and manages an [employer] store. Daughter Josilynn Olinski also lives in [place] and manages the IT Department at [employer]. Son Sylvesta Winding III is a Engineer, materials. Daughter [name] is a Actuary for [employer]. Son [name] is an Actuary. The patient has 8 grandchildren. She attends [employer]   ADVANCED DIRECTIVES: Not in place  HEALTH MAINTENANCE:  (Updated 12/07/2013) Social History  Substance Use Topics  . Smoking status: Never Smoker  . Smokeless tobacco: Never Used  . Alcohol use No     Colonoscopy: Not on file  PAP: Not on file  Bone density:  January 2011   Lipid panel:  Not on file/ Dr. Shelia Media  No Known  Allergies  Current Outpatient Prescriptions  Medication Sig Dispense Refill  . dexamethasone (DECADRON) 4 MG tablet Take 2 tablets (8 mg total) by mouth daily. Start the day after chemotherapy for 2 days. Take with food. 30 tablet 1  . HYDROcodone-acetaminophen (NORCO) 5-325 MG tablet Take 1-2 tablets by mouth every 6 (six) hours as needed. 120 tablet 0  . lidocaine-prilocaine (EMLA) cream Apply to affected area once 30 g 3  . lisinopril (PRINIVIL,ZESTRIL) 10 MG tablet TAKE 1 TABLET EVERY DAY 90 tablet 1  . LORazepam (ATIVAN) 0.5 MG tablet Take 1 tablet (0.5 mg total) by mouth at bedtime as needed (Nausea or vomiting). 30 tablet 0  . metoprolol succinate (TOPROL-XL) 50 MG 24 hr tablet TAKE 1 TABLET BY MOUTH EVERY DAY WITH OR IMMEDIATELY FOLLOWING A MEAL 90 tablet 0  . omega-3 acid ethyl esters (LOVAZA) 1 G capsule Take 1 g by mouth daily.     . potassium chloride (K-DUR) 10 MEQ tablet Take  1 tablet (10 mEq total) by mouth daily. 60 tablet 5  . prochlorperazine (COMPAZINE) 10 MG tablet Take 1 tablet (10 mg total) by mouth every 6 (six) hours as needed (Nausea or vomiting). (Patient not taking: Reported on 03/02/2016) 30 tablet 1  . triamterene-hydrochlorothiazide (MAXZIDE) 75-50 MG tablet Take 1 tablet by mouth daily. 90 tablet 6   No current facility-administered medications for this visit.     OBJECTIVE: Middle-aged [place] American woman In no acute distress Vitals:   04/20/16 0900  BP: (!) 195/91  Pulse: 72  Resp: 18  Temp: 98.4 F (36.9 C)     Body mass index is 24.24 kg/m.    ECOG FS: 1 Filed Weights   04/20/16 0900  Weight: 139 lb (63 kg)    Sclerae unicteric, EOMs intact Oropharynx clear and moist No cervical or supraclavicular adenopathy Lungs no rales or rhonchi Heart regular rate and rhythm Abd soft, nontender, positive bowel sounds MSK no focal spinal tenderness, no upper extremity lymphedema Neuro: nonfocal, well oriented, appropriate affect Breasts: The right  breast is status post mastectomy. The left breast is imaged below.    Photo of left breast 04/20/2016     Photo of left breast 12/17/2015    LAB RESULTS: CBC    Component Value Date/Time   WBC 4.4 04/20/2016 0845   WBC 6.0 01/07/2016 1330   RBC 3.38 (L) 04/20/2016 0845   RBC 4.12 01/07/2016 1330   HGB 11.4 (L) 04/20/2016 0845   HCT 35.0 04/20/2016 0845   PLT 191 04/20/2016 0845   MCV 103.7 (H) 04/20/2016 0845   MCH 33.8 04/20/2016 0845   MCH 32.3 01/07/2016 1330   MCHC 32.6 04/20/2016 0845   MCHC 32.9 01/07/2016 1330   RDW 17.8 (H) 04/20/2016 0845   LYMPHSABS 1.3 04/20/2016 0845   MONOABS 1.0 (H) 04/20/2016 0845   EOSABS 0.0 04/20/2016 0845   BASOSABS 0.0 04/20/2016 0845    CMP     Component Value Date/Time   NA 143 03/23/2016 1056   K 3.4 (L) 03/23/2016 1056   CL 102 01/07/2016 1330   CL 107 01/11/2013 0853   CO2 29 03/23/2016 1056   GLUCOSE 121 03/23/2016 1056   GLUCOSE 123 (H) 01/11/2013 0853   BUN 12.9 03/23/2016 1056   CREATININE 0.8 03/23/2016 1056   CALCIUM 9.2 03/23/2016 1056   PROT 6.9 03/23/2016 1056   ALBUMIN 3.5 03/23/2016 1056   AST 18 03/23/2016 1056   ALT 22 03/23/2016 1056   ALKPHOS 63 03/23/2016 1056   BILITOT 0.53 03/23/2016 1056   GFRNONAA >60 01/07/2016 1330   GFRAA >60 01/07/2016 1330       STUDIES: Ct Chest W Contrast  Result Date: 04/09/2016 CLINICAL DATA:  LEFT-sided breast cancer diagnosed 2007 team. Chemotherapy and radiation therapy complete. Prior history of RIGHT breast cancer EXAM: CT CHEST WITH CONTRAST TECHNIQUE: Multidetector CT imaging of the chest was performed during intravenous contrast administration. CONTRAST:  4mL ISOVUE-300 IOPAMIDOL (ISOVUE-300) INJECTION 61% COMPARISON:  CT 12/16/2015 FINDINGS: Cardiovascular: Coronary artery calcification. Mediastinum/Nodes: LEFT axillary adenopathy is decreased in volume. For example 22 mm rounded LEFT axial lymph node is decreased from 26 mm. More inferior 12 mm lymph node  decreased from 23 mm. Decreased in prominence of the high left sub pectoralis lymph nodes seen on comparison exam. No RIGHT axillary adenopathy. RIGHT axillary lymphadenectomy clips noted. No supraclavicular adenopathy. A LEFT infraclavicular lymph node measuring 7 mm (18/2) adjacent to the inferior jugular vein compares to 5 mm. In  the mediastinum, no lymphadenopathy. No enlarged internal mammary lymph nodes. Lungs/Pleura: Postradiation change in the periphery of the RIGHT upper lobe. Within the lower lobe on the LEFT, nodular consolidation measuring 2.6 cm with peripheral fine airspace disease is new from prior. Upper Abdomen: Limited view of the liver, kidneys, pancreas are unremarkable. Normal adrenal glands. Musculoskeletal: No aggressive osseous lesion. IMPRESSION: 1. Decrease in size of metastatic LEFT axillary lymphadenopathy. 2. One LEFT infraclavicular lymph nodes is enlarged. 3. New focus of peripheral consolidation in the LEFT lower lobe is most suggestive of a focus of infection or infarction. Neoplasm is not favored. No central pulmonary embolism identified. These results will be called to the ordering clinician or representative by the Radiologist Assistant, and communication documented in the PACS or zVision Dashboard. Electronically Signed   By: Suzy Bouchard M.D.   On: 04/09/2016 14:13    ASSESSMENT: 70 y.o.  woman   (1)  s/p Right breast upper outer quadrant and axillary node biopsy 03/08/2012 for a clinical T4, N1-2', stage IIIB invasive ductal carcinoma, grade 3, triple negative, with an MIB-1 of 93%.  (2) definitive staging and treatment delayed as patient canceled staging studies and tried alternative treatments; with eventual progression  (3) neoadjuvant chemotherapy started 08/23/12  (a) received two cycles of neoadjuvant cyclophosphamide, docetaxel, doxorubicin ["TAC"] at standard doses with neulasta support; refused neulasta with cycles 2 and 4; tolerated treatment  poorly  (b) docetaxel was held for final 4 cycles; completed cycle 6 neoadjuvant chemotherapy 12/27/2012    (4) status post right modified radical mastectomy 03/13/2013 showing no residual carcinoma in the breast, but nine of 9 axillary lymph nodes sampled involved with macro metastases (ypTX, ypN2, stage IIIA)-- repeat prognostic panel again triple negative  (5) radiation therapy completed 05/31/2013  (6) patient opted against reconstruction  METASTATIC DISEASE: (7) left axillary adenopathy noted on mammography June 2015, confirmed on CT scan November 2015  (a) patient refused further workup or treatment on multiple visits (see notes)  (8) chest CT 12/16/2015 shows progressive left axillary and subpectoral adenopathy, left upper extremity lymphedema, left breast soft tissue fullness and skin thickening  (a) left axillary lymph node biopsy 12/26/2015 confirms metastatic carcinoma, triple negative  (9) started gemcitabine/carboplatin 01/13/2016, repeated day 1 and day 8 of each 21 cycle   (a) Day 8 cycle 2 held because of low counts  (b) restaging CT scan of the chest 04/09/2016 after 6 cycles of chemotherapy shows evidence of mixed response  (c) treatments changed to every 2 weeks beginning with 04/20/2016 dose  (10) poorly controlled hypertension: Maxide added to lisinopril and metoprolol 04/13/2016  (11) left upper extremity lymphedema  PLAN: Ronnisha isn't sure what the goals of treatment are so we discussed that again today. My concern is to get some kind of local control in the left chest wall area. We would like to shrink the tumor maximally and then proceed to surgery and radiation. That is the immediate goal.  We have had problems with this chemotherapy because of cytopenias. I think we will do better if we go to every 2 weeks and of course she is very pleased by the prospect. The question is whether or not it will work. We will have to follow her left breast closely. If after 2  cycles of the current plan we see disease progression, we will see if she can qualify for the pembrolizumab study.  The big barrier thereof courses her blood pressure. She tells me she is a little bit fatigued  taking 3 blood pressure medications. I do not know to what extent she is being compliant at home. At any rate her blood pressure is not controlled now and she would not qualify for the study if he wanted to enroll her today.  She will be treated today and again 2 weeks from today. She will see me in 2 weeks. She knows to call for any problems that may develop before her next visit.     Chauncey Cruel, MD    04/20/2016

## 2016-04-20 ENCOUNTER — Ambulatory Visit (HOSPITAL_BASED_OUTPATIENT_CLINIC_OR_DEPARTMENT_OTHER): Payer: Medicare Other | Admitting: Oncology

## 2016-04-20 ENCOUNTER — Ambulatory Visit (HOSPITAL_BASED_OUTPATIENT_CLINIC_OR_DEPARTMENT_OTHER): Payer: Medicare Other

## 2016-04-20 ENCOUNTER — Other Ambulatory Visit: Payer: Self-pay | Admitting: Oncology

## 2016-04-20 ENCOUNTER — Other Ambulatory Visit (HOSPITAL_BASED_OUTPATIENT_CLINIC_OR_DEPARTMENT_OTHER): Payer: Medicare Other

## 2016-04-20 ENCOUNTER — Other Ambulatory Visit: Payer: Self-pay | Admitting: *Deleted

## 2016-04-20 VITALS — BP 195/91 | HR 72 | Temp 98.4°F | Resp 18 | Ht 63.5 in | Wt 139.0 lb

## 2016-04-20 DIAGNOSIS — C773 Secondary and unspecified malignant neoplasm of axilla and upper limb lymph nodes: Secondary | ICD-10-CM

## 2016-04-20 DIAGNOSIS — Z5111 Encounter for antineoplastic chemotherapy: Secondary | ICD-10-CM | POA: Diagnosis present

## 2016-04-20 DIAGNOSIS — C50411 Malignant neoplasm of upper-outer quadrant of right female breast: Secondary | ICD-10-CM

## 2016-04-20 DIAGNOSIS — Z171 Estrogen receptor negative status [ER-]: Secondary | ICD-10-CM

## 2016-04-20 DIAGNOSIS — I890 Lymphedema, not elsewhere classified: Secondary | ICD-10-CM

## 2016-04-20 DIAGNOSIS — C50919 Malignant neoplasm of unspecified site of unspecified female breast: Secondary | ICD-10-CM

## 2016-04-20 DIAGNOSIS — C50911 Malignant neoplasm of unspecified site of right female breast: Secondary | ICD-10-CM

## 2016-04-20 DIAGNOSIS — N6320 Unspecified lump in the left breast, unspecified quadrant: Secondary | ICD-10-CM

## 2016-04-20 LAB — CBC WITH DIFFERENTIAL/PLATELET
BASO%: 0.3 % (ref 0.0–2.0)
BASOS ABS: 0 10*3/uL (ref 0.0–0.1)
EOS ABS: 0 10*3/uL (ref 0.0–0.5)
EOS%: 0.3 % (ref 0.0–7.0)
HEMATOCRIT: 35 % (ref 34.8–46.6)
HEMOGLOBIN: 11.4 g/dL — AB (ref 11.6–15.9)
LYMPH#: 1.3 10*3/uL (ref 0.9–3.3)
LYMPH%: 29.4 % (ref 14.0–49.7)
MCH: 33.8 pg (ref 25.1–34.0)
MCHC: 32.6 g/dL (ref 31.5–36.0)
MCV: 103.7 fL — ABNORMAL HIGH (ref 79.5–101.0)
MONO#: 1 10*3/uL — ABNORMAL HIGH (ref 0.1–0.9)
MONO%: 22.1 % — ABNORMAL HIGH (ref 0.0–14.0)
NEUT#: 2.1 10*3/uL (ref 1.5–6.5)
NEUT%: 47.9 % (ref 38.4–76.8)
Platelets: 191 10*3/uL (ref 145–400)
RBC: 3.38 10*6/uL — ABNORMAL LOW (ref 3.70–5.45)
RDW: 17.8 % — AB (ref 11.2–14.5)
WBC: 4.4 10*3/uL (ref 3.9–10.3)

## 2016-04-20 MED ORDER — SODIUM CHLORIDE 0.9 % IV SOLN
160.0000 mg | Freq: Once | INTRAVENOUS | Status: AC
  Administered 2016-04-20: 160 mg via INTRAVENOUS
  Filled 2016-04-20: qty 16

## 2016-04-20 MED ORDER — PALONOSETRON HCL INJECTION 0.25 MG/5ML
INTRAVENOUS | Status: AC
  Filled 2016-04-20: qty 5

## 2016-04-20 MED ORDER — SODIUM CHLORIDE 0.9% FLUSH
10.0000 mL | INTRAVENOUS | Status: DC | PRN
  Administered 2016-04-20: 10 mL
  Filled 2016-04-20: qty 10

## 2016-04-20 MED ORDER — SODIUM CHLORIDE 0.9 % IV SOLN
Freq: Once | INTRAVENOUS | Status: AC
  Administered 2016-04-20: 11:00:00 via INTRAVENOUS

## 2016-04-20 MED ORDER — PALONOSETRON HCL INJECTION 0.25 MG/5ML
0.2500 mg | Freq: Once | INTRAVENOUS | Status: AC
  Administered 2016-04-20: 0.25 mg via INTRAVENOUS

## 2016-04-20 MED ORDER — SODIUM CHLORIDE 0.9 % IV SOLN
800.0000 mg/m2 | Freq: Once | INTRAVENOUS | Status: AC
  Administered 2016-04-20: 1368 mg via INTRAVENOUS
  Filled 2016-04-20: qty 35.98

## 2016-04-20 MED ORDER — HEPARIN SOD (PORK) LOCK FLUSH 100 UNIT/ML IV SOLN
500.0000 [IU] | Freq: Once | INTRAVENOUS | Status: AC | PRN
  Administered 2016-04-20: 500 [IU]
  Filled 2016-04-20: qty 5

## 2016-04-20 MED ORDER — SODIUM CHLORIDE 0.9 % IV SOLN
10.0000 mg | Freq: Once | INTRAVENOUS | Status: AC
  Administered 2016-04-20: 10 mg via INTRAVENOUS
  Filled 2016-04-20: qty 1

## 2016-04-20 NOTE — Patient Instructions (Signed)
[facility] Discharge Instructions for Patients Receiving Chemotherapy  Today you received the following chemotherapy agents, Gemzar and Carboplatin  To help prevent nausea and vomiting after your treatment, we encourage you to take your nausea medication as directed. You were given Aloxi, DO NOT take Zofran until the afternoon of 04/23/16. You may take your compazine as directed.  If you develop nausea and vomiting that is not controlled by your nausea medication, call the clinic.   BELOW ARE SYMPTOMS THAT SHOULD BE REPORTED IMMEDIATELY:  *FEVER GREATER THAN 100.5 F  *CHILLS WITH OR WITHOUT FEVER  NAUSEA AND VOMITING THAT IS NOT CONTROLLED WITH YOUR NAUSEA MEDICATION  *UNUSUAL SHORTNESS OF BREATH  *UNUSUAL BRUISING OR BLEEDING  TENDERNESS IN MOUTH AND THROAT WITH OR WITHOUT PRESENCE OF ULCERS  *URINARY PROBLEMS  *BOWEL PROBLEMS  UNUSUAL RASH Items with * indicate a potential emergency and should be followed up as soon as possible.  Feel free to call the clinic you have any questions or concerns. The clinic phone number is [phone].  Please show the [place] at check-in to the Emergency Department and triage nurse.

## 2016-04-27 ENCOUNTER — Other Ambulatory Visit: Payer: Self-pay | Admitting: Oncology

## 2016-05-04 ENCOUNTER — Telehealth: Payer: Self-pay | Admitting: *Deleted

## 2016-05-04 ENCOUNTER — Ambulatory Visit: Payer: Medicare Other | Admitting: Oncology

## 2016-05-04 ENCOUNTER — Other Ambulatory Visit: Payer: Medicare Other

## 2016-05-04 NOTE — Telephone Encounter (Signed)
Pt called stating she cannot come to her appt today & wishes to cancel.  She will call back at a later time to reschedule.  Cancelled appt as requested.

## 2016-05-13 ENCOUNTER — Other Ambulatory Visit: Payer: Self-pay | Admitting: *Deleted

## 2016-05-13 ENCOUNTER — Telehealth: Payer: Self-pay

## 2016-05-13 MED ORDER — HYDROCODONE-ACETAMINOPHEN 5-325 MG PO TABS: 1.0000 | ORAL_TABLET | Freq: Four times a day (QID) | ORAL | 0 refills | Status: DC | PRN

## 2016-05-13 NOTE — Telephone Encounter (Signed)
lvm that Rx is ready for pickup and to remember to bring drivers license. We are working on appts.

## 2016-05-13 NOTE — Telephone Encounter (Signed)
Patient called @ 95 stating that she did call on 10/9 to CAN her appt. Pt. Said at the time she called she was talked to very rudely. Pt. Said she has been waiting on a call regarding new appt date and time and hasn't received one as of yet. Pt. Said she wanted to speak with the nurse but got vmail, pt. Didn't wish to leave message. Pt was then transferred to scheduling vmail message left.

## 2016-05-13 NOTE — Telephone Encounter (Signed)
LOS placed

## 2016-05-13 NOTE — Telephone Encounter (Signed)
Pt called requesting norco refill. rx prepared for signature. She also reiterated she is trying to r/s the appt she cancelled on 10/9 for illness. S/w Dr Jana Hakim for when to r/x. inbasket sent to [facility] for next available.

## 2016-05-14 ENCOUNTER — Telehealth: Payer: Self-pay | Admitting: Oncology

## 2016-05-14 NOTE — Telephone Encounter (Signed)
lvm to inform pt of r/s chemo appt per LOS

## 2016-05-19 ENCOUNTER — Encounter: Payer: Self-pay | Admitting: Pharmacist

## 2016-05-19 ENCOUNTER — Other Ambulatory Visit: Payer: Self-pay | Admitting: Oncology

## 2016-05-20 ENCOUNTER — Ambulatory Visit (HOSPITAL_BASED_OUTPATIENT_CLINIC_OR_DEPARTMENT_OTHER): Payer: Medicare Other | Admitting: Oncology

## 2016-05-20 ENCOUNTER — Telehealth: Payer: Self-pay | Admitting: *Deleted

## 2016-05-20 ENCOUNTER — Ambulatory Visit (HOSPITAL_BASED_OUTPATIENT_CLINIC_OR_DEPARTMENT_OTHER): Payer: Medicare Other

## 2016-05-20 ENCOUNTER — Other Ambulatory Visit (HOSPITAL_BASED_OUTPATIENT_CLINIC_OR_DEPARTMENT_OTHER): Payer: Medicare Other

## 2016-05-20 VITALS — BP 170/72 | HR 62 | Temp 98.6°F | Resp 18 | Ht 63.5 in | Wt 139.1 lb

## 2016-05-20 DIAGNOSIS — N6320 Unspecified lump in the left breast, unspecified quadrant: Secondary | ICD-10-CM

## 2016-05-20 DIAGNOSIS — Z171 Estrogen receptor negative status [ER-]: Secondary | ICD-10-CM | POA: Diagnosis not present

## 2016-05-20 DIAGNOSIS — C50411 Malignant neoplasm of upper-outer quadrant of right female breast: Secondary | ICD-10-CM | POA: Diagnosis not present

## 2016-05-20 DIAGNOSIS — Z5111 Encounter for antineoplastic chemotherapy: Secondary | ICD-10-CM | POA: Diagnosis present

## 2016-05-20 DIAGNOSIS — I890 Lymphedema, not elsewhere classified: Secondary | ICD-10-CM | POA: Diagnosis not present

## 2016-05-20 DIAGNOSIS — Z23 Encounter for immunization: Secondary | ICD-10-CM

## 2016-05-20 DIAGNOSIS — C50911 Malignant neoplasm of unspecified site of right female breast: Secondary | ICD-10-CM

## 2016-05-20 DIAGNOSIS — C773 Secondary and unspecified malignant neoplasm of axilla and upper limb lymph nodes: Secondary | ICD-10-CM

## 2016-05-20 LAB — COMPREHENSIVE METABOLIC PANEL
ALT: 15 U/L (ref 0–55)
AST: 16 U/L (ref 5–34)
Albumin: 3.6 g/dL (ref 3.5–5.0)
Alkaline Phosphatase: 60 U/L (ref 40–150)
Anion Gap: 10 mEq/L (ref 3–11)
BUN: 32.2 mg/dL — AB (ref 7.0–26.0)
CHLORIDE: 103 meq/L (ref 98–109)
CO2: 27 meq/L (ref 22–29)
CREATININE: 1.6 mg/dL — AB (ref 0.6–1.1)
Calcium: 9.2 mg/dL (ref 8.4–10.4)
EGFR: 39 mL/min/{1.73_m2} — ABNORMAL LOW (ref >90)
GLUCOSE: 141 mg/dL — AB (ref 70–140)
POTASSIUM: 3.5 meq/L (ref 3.5–5.1)
SODIUM: 140 meq/L (ref 136–145)
Total Bilirubin: 0.32 mg/dL (ref 0.20–1.20)
Total Protein: 7.7 g/dL (ref 6.4–8.3)

## 2016-05-20 LAB — CBC WITH DIFFERENTIAL/PLATELET
BASO%: 0.4 % (ref 0.0–2.0)
BASOS ABS: 0 10*3/uL (ref 0.0–0.1)
EOS%: 0.4 % (ref 0.0–7.0)
Eosinophils Absolute: 0 10*3/uL (ref 0.0–0.5)
HEMATOCRIT: 32.9 % — AB (ref 34.8–46.6)
HGB: 10.9 g/dL — ABNORMAL LOW (ref 11.6–15.9)
LYMPH#: 1.3 10*3/uL (ref 0.9–3.3)
LYMPH%: 29 % (ref 14.0–49.7)
MCH: 34.1 pg — AB (ref 25.1–34.0)
MCHC: 33 g/dL (ref 31.5–36.0)
MCV: 103.4 fL — ABNORMAL HIGH (ref 79.5–101.0)
MONO#: 0.7 10*3/uL (ref 0.1–0.9)
MONO%: 15.8 % — AB (ref 0.0–14.0)
NEUT#: 2.4 10*3/uL (ref 1.5–6.5)
NEUT%: 54.4 % (ref 38.4–76.8)
Platelets: 141 10*3/uL — ABNORMAL LOW (ref 145–400)
RBC: 3.18 10*6/uL — AB (ref 3.70–5.45)
RDW: 15.5 % — ABNORMAL HIGH (ref 11.2–14.5)
WBC: 4.5 10*3/uL (ref 3.9–10.3)

## 2016-05-20 MED ORDER — SODIUM CHLORIDE 0.9 % IV SOLN
INTRAVENOUS | Status: DC
  Administered 2016-05-20: 11:00:00 via INTRAVENOUS

## 2016-05-20 MED ORDER — DEXAMETHASONE SODIUM PHOSPHATE 10 MG/ML IJ SOLN
10.0000 mg | Freq: Once | INTRAMUSCULAR | Status: AC
  Administered 2016-05-20: 10 mg via INTRAVENOUS

## 2016-05-20 MED ORDER — INFLUENZA VAC SPLIT QUAD 0.5 ML IM SUSY
0.5000 mL | PREFILLED_SYRINGE | Freq: Once | INTRAMUSCULAR | Status: AC
  Administered 2016-05-20: 0.5 mL via INTRAMUSCULAR
  Filled 2016-05-20: qty 0.5

## 2016-05-20 MED ORDER — HEPARIN SOD (PORK) LOCK FLUSH 100 UNIT/ML IV SOLN
500.0000 [IU] | Freq: Once | INTRAVENOUS | Status: AC | PRN
  Administered 2016-05-20: 500 [IU]
  Filled 2016-05-20: qty 5

## 2016-05-20 MED ORDER — PALONOSETRON HCL INJECTION 0.25 MG/5ML
INTRAVENOUS | Status: AC
  Filled 2016-05-20: qty 5

## 2016-05-20 MED ORDER — PALONOSETRON HCL INJECTION 0.25 MG/5ML
0.2500 mg | Freq: Once | INTRAVENOUS | Status: AC
  Administered 2016-05-20: 0.25 mg via INTRAVENOUS

## 2016-05-20 MED ORDER — SODIUM CHLORIDE 0.9 % IV SOLN
130.0000 mg | Freq: Once | INTRAVENOUS | Status: AC
  Administered 2016-05-20: 130 mg via INTRAVENOUS
  Filled 2016-05-20: qty 13

## 2016-05-20 MED ORDER — DEXAMETHASONE SODIUM PHOSPHATE 10 MG/ML IJ SOLN
INTRAMUSCULAR | Status: AC
  Filled 2016-05-20: qty 1

## 2016-05-20 MED ORDER — SODIUM CHLORIDE 0.9 % IV SOLN
800.0000 mg/m2 | Freq: Once | INTRAVENOUS | Status: AC
  Administered 2016-05-20: 1368 mg via INTRAVENOUS
  Filled 2016-05-20: qty 35.98

## 2016-05-20 MED ORDER — SODIUM CHLORIDE 0.9% FLUSH
10.0000 mL | INTRAVENOUS | Status: DC | PRN
  Administered 2016-05-20: 10 mL
  Filled 2016-05-20: qty 10

## 2016-05-20 NOTE — Telephone Encounter (Signed)
Per MD treat despite labs - including BUN and Creatinine.

## 2016-05-20 NOTE — Progress Notes (Addendum)
Patient ID: Janice Powell, female   DOB: June 15, 1946, 70 y.o.   MRN: [id number] ID: Janice Powell   DOB: [birth year]  MR#: [id number]  CSN#:[id number]  PCP: Janice Pel, MD GYN:  SU: Janice Skates MD OTHER MD: Janice Gibson, MD  CHIEF COMPLAINT:  Hx of Triple Negative Right Breast Cancer; triple negative left axillary adenopathy  CURRENT TREATMENT: Carboplatin, gemcitabine  INTERVAL HISTORY:   Janice Powell returns today for follow-up of her recurrent triple negative breast cancer. Today is day 1 cycle 5 of carboplatin and gemcitabine which she receives currently days 1 and 15 of each 28 day cycle  REVIEW OF SYSTEMS: She has "plenty of energy", has been working on how to better deliver sermons, and generally, "feels great".. She does complain of some more soreness in the right breast and she is having some itching over the left breast area. A detailed review of systems was otherwise noncontributory  HISTORY OF PRESENT ILLNESS: From the original intake note:  Janice Powell noted a mass in her right breast sometime in 2011. 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 More recently her husband twisted her arm to get the mass looked at, and she brought it to Dr. Pennie Powell attention. Diagnostic mammography and right ultrasonography at [facility] 03/02/2012 showed a 6.78 cm irregular mass in the inner aspect of the right breast. There was nipple retraction and erythema around the nipple. The largest lymph node was noted in the right axilla. By ultrasound the large irregular hypoechoic mass was noted in the breast with multiple enlarged axillary lymph nodes. Physical exam confirmed a hard breast with erythema around the right nipple extending inferiorly. The nipple is slightly inverted.  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   Her subsequent history is as detailed below  PAST  MEDICAL HISTORY: Past Medical History:  Diagnosis Date  . Breast cancer ([place]) 03/08/13   right breast - Invasive Ductal Carcinoma   . GERD (gastroesophageal reflux disease)    occ  . Hx antineoplastic chemotherapy    two cycles of neoadjuvant cyclophosphamide, docetaxel, doxorubicin ["TAC"] at standard doses with neulasta support; refused neulasta with cycles 2 and 4; tolerated treatment poorly (b) docetaxel was held for final 4 cycles; completed cycle 6 neoadjuvant chemotherapy 12/27/2012    . Hypertension   . Osteopenia   . Pneumonia    hx  . Primary malignant neoplasm of left breast with stage 2 nodal metastasis per American Joint Committee on Cancer 7th edition (N2) ([place]) 01/09/2016  . S/P radiation therapy    1) Right Chest Wall and IM nodes / 50 Gy in 25 fractions /2) Right Supraclavicular fossa / 50 Gy in 25 fractions/1) Right Chest Wall and IM nodes / 50 Gy in 25 fractions /2) Right Supraclavicular fossa / 50 Gy in 25 fractions/3) Right Posterior Axillary boost / 6.55 Gy in 25 fractions/4) Right Chest Wall Scar boost / 10 Gy in 5 fractions      . Wears glasses   GERD osteopenia  PAST SURGICAL HISTORY: Past Surgical History:  Procedure Laterality Date  . APPENDECTOMY  1992  . BREAST BIOPSY Right   . CHOLECYSTECTOMY  2000  . MASTECTOMY Right 03/13/2013  . MASTECTOMY MODIFIED RADICAL Right 03/13/2013   Procedure: MASTECTOMY MODIFIED RADICAL;  Surgeon: [name], MD;  Location: [place];  Service: General;  Laterality: Right;  . PORT-A-CATH REMOVAL  03/13/2013  .  PORT-A-CATH REMOVAL Left 03/13/2013   Procedure: REMOVAL PORT-A-CATH;  Surgeon: [name], MD;  Location: [place];  Service: General;  Laterality: Left;  . PORTACATH PLACEMENT  08/15/2012   Procedure: INSERTION PORT-A-CATH;  Surgeon: [name], MD;  Location: [place];  Service: General;  Laterality: Left;  . PORTACATH PLACEMENT N/A 01/09/2016   Procedure: INSERTION PORT-A-CATH;  Surgeon: Janice Skates, MD;   Location: [facility];  Service: General;  Laterality: N/A;  . TUBAL LIGATION  1979    FAMILY HISTORY Family History  Problem Relation Age of Onset  . Heart disease Mother    the patient's father died in his sleep at age 70. The patient's mother died at the age of 25 from a myocardial infarction. The patient has one brother and 2 sisters, all surviving. There is no history of breast or ovarian cancer in the family.  GYNECOLOGIC HISTORY:  (Reviewed 12/07/2013) Menarche age 20, first live birth age 62, she is GX P5, menopause age 11. She did not take hormone replacement.  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 Her husband [name] worked in the post office 41 years, and is also now retired. He is a former Company secretary. Son [name] lives in [place] and manages an [employer] store. Daughter Isabella Chelton also lives in [place] and manages the IT Department at [employer]. Son Virjinia Mantey III is a Engineer, materials. Daughter [name] is a Actuary for [employer]. Son [name] is an Actuary. The patient has 8 grandchildren. She attends [employer]   ADVANCED DIRECTIVES: Not in place  HEALTH MAINTENANCE:  (Updated 12/07/2013) Social History  Substance Use Topics  . Smoking status: Never Smoker  . Smokeless tobacco: Never Used  . Alcohol use No     Colonoscopy: Not on file  PAP: Not on file  Bone density:  January 2011   Lipid panel:  Not on file/ Dr. Shelia Media  No Known Allergies  Current Outpatient Prescriptions  Medication Sig Dispense Refill  . dexamethasone (DECADRON) 4 MG tablet Take 2 tablets (8 mg total) by mouth daily. Start the day after chemotherapy for 2 days. Take with food. 30 tablet 1  . HYDROcodone-acetaminophen (NORCO) 5-325 MG tablet Take 1-2 tablets by mouth every 6 (six) hours as needed. 120 tablet 0  . lidocaine-prilocaine (EMLA) cream Apply to affected area once 30 g 3  . lisinopril  (PRINIVIL,ZESTRIL) 10 MG tablet TAKE 1 TABLET EVERY DAY 90 tablet 1  . LORazepam (ATIVAN) 0.5 MG tablet Take 1 tablet (0.5 mg total) by mouth at bedtime as needed (Nausea or vomiting). 30 tablet 0  . metoprolol succinate (TOPROL-XL) 50 MG 24 hr tablet TAKE 1 TABLET BY MOUTH EVERY DAY WITH OR IMMEDIATELY FOLLOWING A MEAL 90 tablet 0  . omega-3 acid ethyl esters (LOVAZA) 1 G capsule Take 1 g by mouth daily.     . potassium chloride (K-DUR) 10 MEQ tablet Take 1 tablet (10 mEq total) by mouth daily. 60 tablet 5  . prochlorperazine (COMPAZINE) 10 MG tablet Take 1 tablet (10 mg total) by mouth every 6 (six) hours as needed (Nausea or vomiting). (Patient not taking: Reported on 03/02/2016) 30 tablet 1  . triamterene-hydrochlorothiazide (MAXZIDE) 75-50 MG tablet Take 1 tablet by mouth daily. 90 tablet 6   No current facility-administered medications for this visit.     OBJECTIVE: Middle-aged [place] American woman  Who appears stated age 72:  05/20/16 0912  BP: (!) 170/72  Pulse: 62  Resp: 18  Temp: 98.6 F (37 C)     Body mass index is 24.25 kg/m.    ECOG FS: 1 Filed Weights   05/20/16 0912  Weight: 139 lb 1.6 oz (63.1 kg)    Sclerae unicteric, pupils round and equal Oropharynx clear and moist-- no thrush or other lesions No cervical or supraclavicular adenopathy Lungs no rales or rhonchi Heart regular rate and rhythm Abd soft, nontender, positive bowel sounds MSK no focal spinal tenderness,  Grade 2 left upper extremity lymphedema Neuro: nonfocal, well oriented, appropriate affect Breasts: The right breast is status post mastectomy. There is now an area of induration associated with the prior incision, measuring approximately 3 x 5 cm, and very irregular. This is most likely local chest wall recurrence. On the left side, the breast looks pretty much as imaged in an earlier photo below.     Photo of left breast 04/20/2016      LAB RESULTS: CBC    Component Value Date/Time     WBC 4.5 05/20/2016 0838   WBC 6.0 01/07/2016 1330   RBC 3.18 (L) 05/20/2016 0838   RBC 4.12 01/07/2016 1330   HGB 10.9 (L) 05/20/2016 0838   HCT 32.9 (L) 05/20/2016 0838   PLT 141 (L) 05/20/2016 0838   MCV 103.4 (H) 05/20/2016 0838   MCH 34.1 (H) 05/20/2016 0838   MCH 32.3 01/07/2016 1330   MCHC 33.0 05/20/2016 0838   MCHC 32.9 01/07/2016 1330   RDW 15.5 (H) 05/20/2016 0838   LYMPHSABS 1.3 05/20/2016 0838   MONOABS 0.7 05/20/2016 0838   EOSABS 0.0 05/20/2016 0838   BASOSABS 0.0 05/20/2016 0838    CMP     Component Value Date/Time   NA 140 05/20/2016 0838   K 3.5 05/20/2016 0838   CL 102 01/07/2016 1330   CL 107 01/11/2013 0853   CO2 27 05/20/2016 0838   GLUCOSE 141 (H) 05/20/2016 0838   GLUCOSE 123 (H) 01/11/2013 0853   BUN 32.2 (H) 05/20/2016 0838   CREATININE 1.6 (H) 05/20/2016 0838   CALCIUM 9.2 05/20/2016 0838   PROT 7.7 05/20/2016 0838   ALBUMIN 3.6 05/20/2016 0838   AST 16 05/20/2016 0838   ALT 15 05/20/2016 0838   ALKPHOS 60 05/20/2016 0838   BILITOT 0.32 05/20/2016 0838   GFRNONAA >60 01/07/2016 1330   GFRAA >60 01/07/2016 1330       STUDIES: No results found.  ASSESSMENT: 70 y.o. [place] woman   (1)  s/p Right breast upper outer quadrant and axillary node biopsy 03/08/2012 for a clinical T4, N1-2', stage IIIB invasive ductal carcinoma, grade 3, triple negative, with an MIB-1 of 93%.  (2) definitive staging and treatment delayed as patient canceled staging studies and tried alternative treatments; with eventual progression  (3) neoadjuvant chemotherapy started 08/23/12  (a) received two cycles of neoadjuvant cyclophosphamide, docetaxel, doxorubicin ["TAC"] at standard doses with neulasta support; refused neulasta with cycles 2 and 4; tolerated treatment poorly  (b) docetaxel was held for final 4 cycles; completed cycle 6 neoadjuvant chemotherapy 12/27/2012    (4) status post right modified radical mastectomy 03/13/2013 showing no residual  carcinoma in the breast, but nine of 9 axillary lymph nodes sampled involved with macro metastases (ypTX, ypN2, stage IIIA)-- repeat prognostic panel again triple negative  (5) radiation therapy completed 05/31/2013  (6) patient opted against reconstruction  METASTATIC DISEASE: (7) left axillary adenopathy noted on mammography June 2015, confirmed on CT  scan November 2015  (a) patient refused further workup or treatment on multiple visits (see notes)  (8) chest CT 12/16/2015 shows progressive left axillary and subpectoral adenopathy, left upper extremity lymphedema, left breast soft tissue fullness and skin thickening  (a) left axillary lymph node biopsy 12/26/2015 confirms metastatic carcinoma, triple negative  (9) started gemcitabine/carboplatin 01/13/2016, repeated day 1 and day 8 of each 21 cycle   (a) Day 8 cycle 2 held because of low counts  (b) restaging CT scan of the chest 04/09/2016 after 6 cycles of chemotherapy shows evidence of mixed response  (c) treatments changed to every 2 weeks beginning with 04/20/2016 dose  (10) poorly controlled hypertension: Maxide added to lisinopril and metoprolol 04/13/2016  (11) left upper extremity lymphedema  PLAN: I believe left flatus tumor is likely growing despite her current treatment. She believes it is stable. After much discussion we are proceeding with treatment today, but she will be restaged before receiving any further chemotherapy.   She will have specifically a CT of the chest abdomen and pelvis on November 7 and return to see me November 10 to discuss results  If we document progression, as I expect, I would like to refer her to our [facility] 1525 study. One problem has been her blood pressure, but this is now much better controlled.  Today her creatinine was somewhat up. This could be due to her blood pressure medication which she is now actually take We're going to add some fluids to her treatment. We will be rechecking that next  Monday (05/25/2016).  Claudia knows to call for any problems that may develop before then.     Chauncey Cruel, MD    05/20/2016

## 2016-05-20 NOTE — Patient Instructions (Signed)
[place] Discharge Instructions for Patients Receiving Chemotherapy  Today you received the following chemotherapy agents, Gemzar and Carboplatin  To help prevent nausea and vomiting after your treatment, we encourage you to take your nausea medication as directed. You were given Aloxi, DO NOT take Zofran until the afternoon of 04/23/16. You may take your compazine as directed.  If you develop nausea and vomiting that is not controlled by your nausea medication, call the clinic.   BELOW ARE SYMPTOMS THAT SHOULD BE REPORTED IMMEDIATELY:  *FEVER GREATER THAN 100.5 F  *CHILLS WITH OR WITHOUT FEVER  NAUSEA AND VOMITING THAT IS NOT CONTROLLED WITH YOUR NAUSEA MEDICATION  *UNUSUAL SHORTNESS OF BREATH  *UNUSUAL BRUISING OR BLEEDING  TENDERNESS IN MOUTH AND THROAT WITH OR WITHOUT PRESENCE OF ULCERS  *URINARY PROBLEMS  *BOWEL PROBLEMS  UNUSUAL RASH Items with * indicate a potential emergency and should be followed up as soon as possible.  Feel free to call the clinic you have any questions or concerns. The clinic phone number is [phone].  Please show the [place] at check-in to the Emergency Department and triage nurse.

## 2016-05-25 ENCOUNTER — Ambulatory Visit: Payer: Medicare Other | Admitting: Oncology

## 2016-05-25 ENCOUNTER — Ambulatory Visit (HOSPITAL_BASED_OUTPATIENT_CLINIC_OR_DEPARTMENT_OTHER): Payer: Medicare Other | Admitting: Oncology

## 2016-05-25 ENCOUNTER — Other Ambulatory Visit: Payer: Medicare Other

## 2016-05-25 ENCOUNTER — Encounter: Payer: Self-pay | Admitting: Oncology

## 2016-05-25 ENCOUNTER — Other Ambulatory Visit (HOSPITAL_BASED_OUTPATIENT_CLINIC_OR_DEPARTMENT_OTHER): Payer: Medicare Other

## 2016-05-25 VITALS — BP 167/59 | HR 65 | Temp 98.4°F | Resp 18 | Wt 138.9 lb

## 2016-05-25 DIAGNOSIS — Z171 Estrogen receptor negative status [ER-]: Secondary | ICD-10-CM

## 2016-05-25 DIAGNOSIS — L0390 Cellulitis, unspecified: Secondary | ICD-10-CM | POA: Insufficient documentation

## 2016-05-25 DIAGNOSIS — C50411 Malignant neoplasm of upper-outer quadrant of right female breast: Secondary | ICD-10-CM

## 2016-05-25 DIAGNOSIS — C50919 Malignant neoplasm of unspecified site of unspecified female breast: Secondary | ICD-10-CM

## 2016-05-25 DIAGNOSIS — L03818 Cellulitis of other sites: Secondary | ICD-10-CM

## 2016-05-25 LAB — CBC WITH DIFFERENTIAL/PLATELET
BASO%: 0.3 % (ref 0.0–2.0)
Basophils Absolute: 0 10*3/uL (ref 0.0–0.1)
EOS%: 0.4 % (ref 0.0–7.0)
Eosinophils Absolute: 0 10*3/uL (ref 0.0–0.5)
HEMATOCRIT: 31.9 % — AB (ref 34.8–46.6)
HEMOGLOBIN: 10.5 g/dL — AB (ref 11.6–15.9)
LYMPH#: 0.9 10*3/uL (ref 0.9–3.3)
LYMPH%: 30.6 % (ref 14.0–49.7)
MCH: 34 pg (ref 25.1–34.0)
MCHC: 32.9 g/dL (ref 31.5–36.0)
MCV: 103.5 fL — ABNORMAL HIGH (ref 79.5–101.0)
MONO#: 0.1 10*3/uL (ref 0.1–0.9)
MONO%: 1.9 % (ref 0.0–14.0)
NEUT#: 2 10*3/uL (ref 1.5–6.5)
NEUT%: 66.8 % (ref 38.4–76.8)
PLATELETS: 128 10*3/uL — AB (ref 145–400)
RBC: 3.08 10*6/uL — ABNORMAL LOW (ref 3.70–5.45)
RDW: 14.8 % — AB (ref 11.2–14.5)
WBC: 2.9 10*3/uL — ABNORMAL LOW (ref 3.9–10.3)

## 2016-05-25 MED ORDER — AMOXICILLIN-POT CLAVULANATE 875-125 MG PO TABS: 1.0000 | ORAL_TABLET | Freq: Two times a day (BID) | ORAL | 0 refills | Status: DC

## 2016-05-25 NOTE — Progress Notes (Signed)
SYMPTOM MANAGEMENT CLINIC    Chief Complaint: Open area to left breast  HPI:  Janice Powell 70 y.o. female diagnosed with metastatic breast cancer currently receiving treatment with gemcitabine and carboplatin. She was last treated approximately one week ago. The patient was in our office today for lab work was asked to be seen due to open area that is bleeding on her left breast. Patient states that she had a blister on this area which was rubbing on her bra. Last evening when she took her bra off the scab fell off and she has been having bleeding to this area. She has not noted any purulent drainage. Patient placed a dry dressing on this area and use duct tape to hold it in place as this was the only tape that she had in the home. The patient reports that her left breast has been hard and red. She also reports that breast has been hot to the touch. She denies fevers and chills.   No history exists.    Review of Systems  Constitutional: Negative.   HENT: Negative.   Eyes: Negative.   Respiratory: Negative.   Cardiovascular: Negative.   Gastrointestinal: Negative.   Genitourinary: Negative.   Musculoskeletal: Negative.   Skin:       Open area to left breast which has been bleeding.   Neurological: Negative.   Endo/Heme/Allergies: Negative.   Psychiatric/Behavioral: Negative.     Past Medical History:  Diagnosis Date  . Breast cancer ([place]) 03/08/13   right breast - Invasive Ductal Carcinoma   . GERD (gastroesophageal reflux disease)    occ  . Hx antineoplastic chemotherapy    two cycles of neoadjuvant cyclophosphamide, docetaxel, doxorubicin ["TAC"] at standard doses with neulasta support; refused neulasta with cycles 2 and 4; tolerated treatment poorly (b) docetaxel was held for final 4 cycles; completed cycle 6 neoadjuvant chemotherapy 12/27/2012    . Hypertension   . Osteopenia   . Pneumonia    hx  . Primary malignant neoplasm of left breast with stage 2 nodal metastasis  per American Joint Committee on Cancer 7th edition (N2) ([place]) 01/09/2016  . S/P radiation therapy    1) Right Chest Wall and IM nodes / 50 Gy in 25 fractions /2) Right Supraclavicular fossa / 50 Gy in 25 fractions/1) Right Chest Wall and IM nodes / 50 Gy in 25 fractions /2) Right Supraclavicular fossa / 50 Gy in 25 fractions/3) Right Posterior Axillary boost / 6.55 Gy in 25 fractions/4) Right Chest Wall Scar boost / 10 Gy in 5 fractions      . Wears glasses     Past Surgical History:  Procedure Laterality Date  . APPENDECTOMY  1992  . BREAST BIOPSY Right   . CHOLECYSTECTOMY  2000  . MASTECTOMY Right 03/13/2013  . MASTECTOMY MODIFIED RADICAL Right 03/13/2013   Procedure: MASTECTOMY MODIFIED RADICAL;  Surgeon: [name], MD;  Location: [place];  Service: General;  Laterality: Right;  . PORT-A-CATH REMOVAL  03/13/2013  . PORT-A-CATH REMOVAL Left 03/13/2013   Procedure: REMOVAL PORT-A-CATH;  Surgeon: [name], MD;  Location: [place];  Service: General;  Laterality: Left;  . PORTACATH PLACEMENT  08/15/2012   Procedure: INSERTION PORT-A-CATH;  Surgeon: [name], MD;  Location: [place];  Service: General;  Laterality: Left;  . PORTACATH PLACEMENT N/A 01/09/2016   Procedure: INSERTION PORT-A-CATH;  Surgeon: [name], MD;  [facility];  Service: General;  Laterality: N/A;  . [place]  has Hypertension; Hypokalemia; Breast cancer of upper-outer quadrant of right female breast ([place]); Atherosclerosis of aorta ([place]); Breast mass, left; Lymphedema of arm; and Recurrent cancer of right breast ([place]) on her problem list.    has No Known Allergies.    Medication List       Accurate as of 05/25/16 10:00 AM. Always use your most recent med list.          dexamethasone 4 MG tablet Commonly known as:  DECADRON Take 2 tablets (8 mg total) by mouth daily. Start the day after chemotherapy for 2 days. Take with food.   HYDROcodone-acetaminophen 5-325 MG  tablet Commonly known as:  NORCO Take 1-2 tablets by mouth every 6 (six) hours as needed.   lidocaine-prilocaine cream Commonly known as:  EMLA Apply to affected area once   lisinopril 10 MG tablet Commonly known as:  PRINIVIL,ZESTRIL TAKE 1 TABLET EVERY DAY   LORazepam 0.5 MG tablet Commonly known as:  ATIVAN Take 1 tablet (0.5 mg total) by mouth at bedtime as needed (Nausea or vomiting).   metoprolol succinate 50 MG 24 hr tablet Commonly known as:  TOPROL-XL TAKE 1 TABLET BY MOUTH EVERY DAY WITH OR IMMEDIATELY FOLLOWING A MEAL   omega-3 acid ethyl esters 1 g capsule Commonly known as:  LOVAZA Take 1 g by mouth daily.   potassium chloride 10 MEQ tablet Commonly known as:  K-DUR Take 1 tablet (10 mEq total) by mouth daily.   prochlorperazine 10 MG tablet Commonly known as:  COMPAZINE Take 1 tablet (10 mg total) by mouth every 6 (six) hours as needed (Nausea or vomiting).   triamterene-hydrochlorothiazide 75-50 MG tablet Commonly known as:  MAXZIDE Take 1 tablet by mouth daily.        PHYSICAL EXAMINATION  Oncology Vitals 05/25/2016 05/20/2016  Height - 161 cm  Weight 63.005 kg 63.095 kg  Weight (lbs) 138 lbs 14 oz 139 lbs 2 oz  BMI (kg/m2) 24.22 kg/m2 24.25 kg/m2  Temp 98.4 98.6  Pulse 65 62  Resp 18 18  SpO2 99 100  BSA (m2) 1.68 m2 1.68 m2   BP Readings from Last 2 Encounters:  05/25/16 (!) 167/59  05/20/16 (!) 170/72    Physical Exam  Constitutional: She is oriented to person, place, and time and well-developed, well-nourished, and in no distress. No distress.  Neurological: She is alert and oriented to person, place, and time.  Skin: She is not diaphoretic. There is erythema.  Left breast hard with redness and hot warm to touch. Open area approximately 3/4 cm open and bleeding.     LABORATORY DATA:. Appointment on 05/25/2016  Component Date Value Ref Range Status  . WBC 05/25/2016 2.9* 3.9 - 10.3 10e3/uL Final  . NEUT# 05/25/2016 2.0  1.5 - 6.5  10e3/uL Final  . HGB 05/25/2016 10.5* 11.6 - 15.9 g/dL Final  . HCT 05/25/2016 31.9* 34.8 - 46.6 % Final  . Platelets 05/25/2016 128* 145 - 400 10e3/uL Final  . MCV 05/25/2016 103.5* 79.5 - 101.0 fL Final  . MCH 05/25/2016 34.0  25.1 - 34.0 pg Final  . MCHC 05/25/2016 32.9  31.5 - 36.0 g/dL Final  . RBC 05/25/2016 3.08* 3.70 - 5.45 10e6/uL Final  . RDW 05/25/2016 14.8* 11.2 - 14.5 % Final  . lymph# 05/25/2016 0.9  0.9 - 3.3 10e3/uL Final  . MONO# 05/25/2016 0.1  0.1 - 0.9 10e3/uL Final  . Eosinophils Absolute 05/25/2016 0.0  0.0 - 0.5 10e3/uL Final  . Basophils Absolute 05/25/2016  0.0  0.0 - 0.1 10e3/uL Final  . NEUT% 05/25/2016 66.8  38.4 - 76.8 % Final  . LYMPH% 05/25/2016 30.6  14.0 - 49.7 % Final  . MONO% 05/25/2016 1.9  0.0 - 14.0 % Final  . EOS% 05/25/2016 0.4  0.0 - 7.0 % Final  . BASO% 05/25/2016 0.3  0.0 - 2.0 % Final    RADIOGRAPHIC STUDIES: No results found.  ASSESSMENT/PLAN:    No problem-specific Assessment & Plan notes found for this encounter. Mrs. 70 year old female with metastatic breast cancer seen today for open area and bleeding on her left breast. The area is also hard and hot to the touch. The area was cleansed with normal saline and Vaseline gauze was placed over top of it. This was then covered with a 4 x 4. The patient was instructed to change his dressing once a day. Since the areas hot to the touch I will also treat her for cellulitis, however, this area could also represent disease progression. She was given a prescription for Augmentin 875 mg twice a day for 10 days. The patient is due to have a restaging CT scan performed prior to her next visit with Dr. Jana Hakim in November.  Patient stated understanding of all instructions; and was in agreement with this plan of care. The patient knows to call the clinic with any problems, questions or concerns.   Total time spent with patient was 20 minutes;  with greater than 50 percent of that time spent in face to face  counseling regarding patient's symptoms,  and coordination of care and follow up.  Mikey Bussing, NP 05/25/2016

## 2016-06-01 ENCOUNTER — Other Ambulatory Visit: Payer: Self-pay | Admitting: Oncology

## 2016-06-01 DIAGNOSIS — I10 Essential (primary) hypertension: Secondary | ICD-10-CM

## 2016-06-01 MED ORDER — CLONIDINE HCL 0.2 MG/24HR TD PTWK: 0.2000 mg | MEDICATED_PATCH | TRANSDERMAL | 12 refills | Status: DC

## 2016-06-01 NOTE — Progress Notes (Signed)
I called  Janice Powell to let her know I was adding TS2 patches for her blood pressure to see if we can get it under control so she can participate in the pembrolizumab study. She had many questions, appropriately, regarding this, but understands the need to get her blood pressure under control and why it would be desirable for her to participate in the study.  She also had questions about her scans. She understands I do not know if her cancer is in other parts of her body aside from her breast and chest wall. It is what were doing the scans. Hopefully she will proceed with those.  She will see me 06/08/2016 and we will discuss all this at length at that time.

## 2016-06-02 ENCOUNTER — Other Ambulatory Visit (HOSPITAL_BASED_OUTPATIENT_CLINIC_OR_DEPARTMENT_OTHER): Payer: Medicare Other

## 2016-06-02 ENCOUNTER — Ambulatory Visit (HOSPITAL_COMMUNITY)
Admission: RE | Admit: 2016-06-02 | Discharge: 2016-06-02 | Disposition: A | Discharge status: Home / Self Care | Payer: Medicare Other | Source: Ambulatory Visit | Attending: Oncology | Admitting: Oncology

## 2016-06-02 ENCOUNTER — Encounter (HOSPITAL_COMMUNITY): Payer: Self-pay

## 2016-06-02 DIAGNOSIS — C50911 Malignant neoplasm of unspecified site of right female breast: Secondary | ICD-10-CM

## 2016-06-02 DIAGNOSIS — C50411 Malignant neoplasm of upper-outer quadrant of right female breast: Secondary | ICD-10-CM | POA: Diagnosis not present

## 2016-06-02 DIAGNOSIS — Z171 Estrogen receptor negative status [ER-]: Secondary | ICD-10-CM | POA: Diagnosis not present

## 2016-06-02 DIAGNOSIS — C50912 Malignant neoplasm of unspecified site of left female breast: Secondary | ICD-10-CM | POA: Diagnosis not present

## 2016-06-02 LAB — CBC WITH DIFFERENTIAL/PLATELET
BASO%: 0 % (ref 0.0–2.0)
Basophils Absolute: 0 10*3/uL (ref 0.0–0.1)
EOS%: 0.3 % (ref 0.0–7.0)
Eosinophils Absolute: 0 10*3/uL (ref 0.0–0.5)
HCT: 31.3 % — ABNORMAL LOW (ref 34.8–46.6)
HGB: 10.2 g/dL — ABNORMAL LOW (ref 11.6–15.9)
LYMPH%: 32.2 % (ref 14.0–49.7)
MCH: 33.7 pg (ref 25.1–34.0)
MCHC: 32.6 g/dL (ref 31.5–36.0)
MCV: 103.3 fL — ABNORMAL HIGH (ref 79.5–101.0)
MONO#: 0.5 10*3/uL (ref 0.1–0.9)
MONO%: 11.9 % (ref 0.0–14.0)
NEUT%: 55.6 % (ref 38.4–76.8)
NEUTROS ABS: 2.1 10*3/uL (ref 1.5–6.5)
Platelets: 63 10*3/uL — ABNORMAL LOW (ref 145–400)
RBC: 3.03 10*6/uL — AB (ref 3.70–5.45)
RDW: 14.4 % (ref 11.2–14.5)
WBC: 3.8 10*3/uL — AB (ref 3.9–10.3)
lymph#: 1.2 10*3/uL (ref 0.9–3.3)

## 2016-06-02 LAB — COMPREHENSIVE METABOLIC PANEL
ALT: 17 U/L (ref 0–55)
AST: 18 U/L (ref 5–34)
Albumin: 3.9 g/dL (ref 3.5–5.0)
Alkaline Phosphatase: 57 U/L (ref 40–150)
Anion Gap: 9 mEq/L (ref 3–11)
BUN: 26.6 mg/dL — ABNORMAL HIGH (ref 7.0–26.0)
CO2: 28 meq/L (ref 22–29)
CREATININE: 1.4 mg/dL — AB (ref 0.6–1.1)
Calcium: 9.5 mg/dL (ref 8.4–10.4)
Chloride: 102 mEq/L (ref 98–109)
EGFR: 44 mL/min/{1.73_m2} — ABNORMAL LOW (ref >90)
GLUCOSE: 90 mg/dL (ref 70–140)
Potassium: 3.7 mEq/L (ref 3.5–5.1)
SODIUM: 139 meq/L (ref 136–145)
Total Bilirubin: 0.3 mg/dL (ref 0.20–1.20)
Total Protein: 8.3 g/dL (ref 6.4–8.3)

## 2016-06-02 MED ORDER — IOPAMIDOL (ISOVUE-300) INJECTION 61%
100.0000 mL | Freq: Once | INTRAVENOUS | Status: AC | PRN
  Administered 2016-06-02: 100 mL via INTRAVENOUS

## 2016-06-05 ENCOUNTER — Encounter: Payer: Self-pay | Admitting: Medical Oncology

## 2016-06-05 ENCOUNTER — Ambulatory Visit (HOSPITAL_BASED_OUTPATIENT_CLINIC_OR_DEPARTMENT_OTHER): Payer: Medicare Other | Admitting: Oncology

## 2016-06-05 ENCOUNTER — Other Ambulatory Visit: Payer: Self-pay | Admitting: Oncology

## 2016-06-05 ENCOUNTER — Telehealth: Payer: Self-pay | Admitting: Oncology

## 2016-06-05 VITALS — BP 159/75 | HR 77 | Temp 98.1°F | Resp 18 | Ht 63.5 in | Wt 140.3 lb

## 2016-06-05 DIAGNOSIS — C773 Secondary and unspecified malignant neoplasm of axilla and upper limb lymph nodes: Secondary | ICD-10-CM

## 2016-06-05 DIAGNOSIS — C50411 Malignant neoplasm of upper-outer quadrant of right female breast: Secondary | ICD-10-CM

## 2016-06-05 DIAGNOSIS — Z171 Estrogen receptor negative status [ER-]: Secondary | ICD-10-CM | POA: Diagnosis not present

## 2016-06-05 DIAGNOSIS — C50911 Malignant neoplasm of unspecified site of right female breast: Secondary | ICD-10-CM

## 2016-06-05 DIAGNOSIS — I890 Lymphedema, not elsewhere classified: Secondary | ICD-10-CM

## 2016-06-05 MED ORDER — HYDROCODONE-ACETAMINOPHEN 5-325 MG PO TABS: 1.0000 | ORAL_TABLET | Freq: Four times a day (QID) | ORAL | 0 refills | Status: DC | PRN

## 2016-06-05 NOTE — Progress Notes (Signed)
Patient ID: Janice Powell, female   DOB: 20-Jul-1946, 70 y.o.   MRN: [id number] ID: [name]   DOB: November 11, 1945  MR#: [id number]  CSN#:[id number]  PCP: Horatio Pel, MD GYN:  SU: [name] OTHER MD: Eppie Gibson, MD  CHIEF COMPLAINT:  Hx of Triple Negative Right Breast Cancer; triple negative left axillary adenopathy  CURRENT TREATMENT: considering pembrolizumab  INTERVAL HISTORY:   Janice Powell returns today for follow-up of her recurrent triple negative breast cancer. Her research nurse Otilio Miu wa also present during today's visit.  Today is day 17 cycle 5 of carboplatin and gemcitabine, which Nailani tolerated generally well. We are just restage her with CT scans of the chest abdomen and pelvis, and this shows some evidence of response but also evidence of disease progression.   In preparation for consideration of the 1521 study I wrote a prescription for TTS 2, but the patient has not purchased this medication yet because she wanted to discuss it first.  REVIEW OF SYSTEMS: She is having some bleeding from an ulcerated spot in her left breast. She denies reverse, drenching sweats, or unusual fatigue. She is having more pain. She denies unusual headaches, visual changes, nausea, or vomiting has been no change in bowel or bladder habits. She has some itching in the right chest wall. A detailed review of systems was otherwise stable   HISTORY OF PRESENT ILLNESS: From the original intake note:  [name] noted a mass in her right breast sometime in 2011. 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 More recently her husband twisted her arm to get the mass looked at, and she brought it to Dr. Pennie Banter attention. Diagnostic mammography and right ultrasonography at [facility] 03/02/2012 showed a 6.78 cm irregular mass in the inner aspect of the right breast. There was nipple retraction and erythema around the nipple. The largest lymph node  was noted in the right axilla. By ultrasound the large irregular hypoechoic mass was noted in the breast with multiple enlarged axillary lymph nodes. Physical exam confirmed a hard breast with erythema around the right nipple extending inferiorly. The nipple is slightly inverted.  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   Her subsequent history is as detailed below  PAST MEDICAL HISTORY: Past Medical History:  Diagnosis Date  . Breast cancer ([place]) 03/08/13   right breast - Invasive Ductal Carcinoma   . GERD (gastroesophageal reflux disease)    occ  . Hx antineoplastic chemotherapy    two cycles of neoadjuvant cyclophosphamide, docetaxel, doxorubicin ["TAC"] at standard doses with neulasta support; refused neulasta with cycles 2 and 4; tolerated treatment poorly (b) docetaxel was held for final 4 cycles; completed cycle 6 neoadjuvant chemotherapy 12/27/2012    . Hypertension   . Osteopenia   . Pneumonia    hx  . Primary malignant neoplasm of left breast with stage 2 nodal metastasis per American Joint Committee on Cancer 7th edition (N2) ([place]) 01/09/2016  . S/P radiation therapy    1) Right Chest Wall and IM nodes / 50 Gy in 25 fractions /2) Right Supraclavicular fossa / 50 Gy in 25 fractions/1) Right Chest Wall and IM nodes / 50 Gy in 25 fractions /2) Right Supraclavicular fossa / 50 Gy in 25 fractions/3) Right Posterior Axillary boost / 6.55 Gy in 25 fractions/4) Right Chest Wall Scar boost / 10 Gy in 5 fractions      .  Wears glasses   GERD osteopenia  PAST SURGICAL HISTORY: Past Surgical History:  Procedure Laterality Date  . APPENDECTOMY  1992  . BREAST BIOPSY Right   . CHOLECYSTECTOMY  2000  . MASTECTOMY Right 03/13/2013  . MASTECTOMY MODIFIED RADICAL Right 03/13/2013   Procedure: MASTECTOMY MODIFIED RADICAL;  Surgeon: [name], MD;  Location: [facility];  Service: General;  Laterality: Right;    . PORT-A-CATH REMOVAL  03/13/2013  . PORT-A-CATH REMOVAL Left 03/13/2013   Procedure: REMOVAL PORT-A-CATH;  Surgeon: [name], MD;  Location: [facility];  Service: General;  Laterality: Left;  . PORTACATH PLACEMENT  08/15/2012   Procedure: INSERTION PORT-A-CATH;  Surgeon: [name], MD;  Location: [facility];  Service: General;  Laterality: Left;  . PORTACATH PLACEMENT N/A 01/09/2016   Procedure: INSERTION PORT-A-CATH;  Surgeon: [name], MD;  Location: [facility];  Service: General;  Laterality: N/A;  . TUBAL LIGATION  1979    FAMILY HISTORY Family History  Problem Relation Age of Onset  . Heart disease Mother    the patient's father died in his sleep at age 79. The patient's mother died at the age of 38 from a myocardial infarction. The patient has one brother and 2 sisters, all surviving. There is no history of breast or ovarian cancer in the family.  GYNECOLOGIC HISTORY:  (Reviewed 12/07/2013) Menarche age 4, first live birth age 34, she is GX P5, menopause age 89. She did not take hormone replacement.  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 Her husband Janice Powell worked in the post office 22 years, and is also now retired. He is a former Company secretary. Son Janice Powell lives in [place] and manages an [employer] store. Daughter Janice Powell also lives in [place] and manages the IT Department at [employer]. Son Janice Powell is a Engineer, materials. Daughter Janice Powell is a Actuary for [employer]. Son Janice Powell is an Actuary. The patient has 8 grandchildren. She attends [employer]   ADVANCED DIRECTIVES: Not in place  HEALTH MAINTENANCE:  (Updated 12/07/2013) Social History  Substance Use Topics  . Smoking status: Never Smoker  . Smokeless tobacco: Never Used  . Alcohol use No     Colonoscopy: Not on file  PAP: Not on file  Bone density:  January 2011   Lipid panel:  Not on file/ Dr.  Shelia Media  No Known Allergies  Current Outpatient Prescriptions  Medication Sig Dispense Refill  . cloNIDine (CATAPRES-TTS-2) 0.2 mg/24hr patch Place 1 patch (0.2 mg total) onto the skin once a week. 4 patch 12  . dexamethasone (DECADRON) 4 MG tablet Take 2 tablets (8 mg total) by mouth daily. Start the day after chemotherapy for 2 days. Take with food. 30 tablet 1  . HYDROcodone-acetaminophen (NORCO) 5-325 MG tablet Take 1-2 tablets by mouth every 6 (six) hours as needed. 120 tablet 0  . lidocaine-prilocaine (EMLA) cream Apply to affected area once 30 g 3  . lisinopril (PRINIVIL,ZESTRIL) 10 MG tablet TAKE 1 TABLET EVERY DAY 90 tablet 1  . LORazepam (ATIVAN) 0.5 MG tablet Take 1 tablet (0.5 mg total) by mouth at bedtime as needed (Nausea or vomiting). 30 tablet 0  . metoprolol succinate (TOPROL-XL) 50 MG 24 hr tablet TAKE 1 TABLET BY MOUTH EVERY DAY WITH OR IMMEDIATELY FOLLOWING A MEAL 90 tablet 0  . omega-3 acid ethyl esters (LOVAZA) 1 G capsule Take 1 g by mouth  daily.     . potassium chloride (K-DUR) 10 MEQ tablet Take 1 tablet (10 mEq total) by mouth daily. 60 tablet 5  . prochlorperazine (COMPAZINE) 10 MG tablet Take 1 tablet (10 mg total) by mouth every 6 (six) hours as needed (Nausea or vomiting). (Patient not taking: Reported on 05/25/2016) 30 tablet 1  . triamterene-hydrochlorothiazide (MAXZIDE) 75-50 MG tablet Take 1 tablet by mouth daily. 90 tablet 6   No current facility-administered medications for this visit.     OBJECTIVE: Middle-aged African American woman Vitals:   06/05/16 1406  BP: (!) 159/75  Pulse: 77  Resp: 18  Temp: 98.1 F (36.7 C)     Body mass index is 24.46 kg/m.    ECOG FS: 1 Filed Weights   06/05/16 1406  Weight: 140 lb 4.8 oz (63.6 kg)    Sclerae unicteric, EOMs intact Oropharynx clear, slightly dry No cervical or supraclavicular adenopathy Lungs no rales or rhonchi Heart regular rate and rhythm Abd soft, nontender, positive bowel sounds MSK no  focal spinal tenderness, no upper extremity lymphedema Neuro: nonfocal, well oriented, appropriate affect Breasts: The right breast is status post mastectomy and radiation. There is an "itchy area" over this section which is imaged below. There is a concern that this may represent local recurrence of her tumor. On the left side the breast remains firm, erythematous, and there is a nickel sized area of shallow erosion which is bleeding in the superior/anterior aspect of the breast. This was not imaged today.    Area of concern in the right chest wall 06/05/2016      Photo of left breast 04/20/2016      LAB RESULTS: CBC    Component Value Date/Time   WBC 3.8 (L) 06/02/2016 1058   WBC 6.0 01/07/2016 1330   RBC 3.03 (L) 06/02/2016 1058   RBC 4.12 01/07/2016 1330   HGB 10.2 (L) 06/02/2016 1058   HCT 31.3 (L) 06/02/2016 1058   PLT 63 (L) 06/02/2016 1058   MCV 103.3 (H) 06/02/2016 1058   MCH 33.7 06/02/2016 1058   MCH 32.3 01/07/2016 1330   MCHC 32.6 06/02/2016 1058   MCHC 32.9 01/07/2016 1330   RDW 14.4 06/02/2016 1058   LYMPHSABS 1.2 06/02/2016 1058   MONOABS 0.5 06/02/2016 1058   EOSABS 0.0 06/02/2016 1058   BASOSABS 0.0 06/02/2016 1058    CMP     Component Value Date/Time   NA 139 06/02/2016 1058   K 3.7 06/02/2016 1058   CL 102 01/07/2016 1330   CL 107 01/11/2013 0853   CO2 28 06/02/2016 1058   GLUCOSE 90 06/02/2016 1058   GLUCOSE 123 (H) 01/11/2013 0853   BUN 26.6 (H) 06/02/2016 1058   CREATININE 1.4 (H) 06/02/2016 1058   CALCIUM 9.5 06/02/2016 1058   PROT 8.3 06/02/2016 1058   ALBUMIN 3.9 06/02/2016 1058   AST 18 06/02/2016 1058   ALT 17 06/02/2016 1058   ALKPHOS 57 06/02/2016 1058   BILITOT 0.30 06/02/2016 1058   GFRNONAA >60 01/07/2016 1330   GFRAA >60 01/07/2016 1330       STUDIES: Ct Chest W Contrast  Result Date: 06/02/2016 CLINICAL DATA:  Restaging breast cancer. Initial diagnosis 2014 with RIGHT breast cancer and mastectomy. LEFT breast  cancer 2017. Ongoing chemotherapy. EXAM: CT CHEST, ABDOMEN, AND PELVIS WITH CONTRAST TECHNIQUE: Multidetector CT imaging of the chest, abdomen and pelvis was performed following the standard protocol during bolus administration of intravenous contrast. CONTRAST:  172mL ISOVUE-300 IOPAMIDOL (ISOVUE-300) INJECTION 61%  COMPARISON:  None. FINDINGS: CT CHEST FINDINGS Cardiovascular: No significant vascular calcification. No pericardial fluid. Mediastinum/Nodes: Rounded LEFT axillary lymph nodes are smaller than prior. Example 13 mm lymph node decreased from Find 25 mm. LEFT supraclavicular lymph node measures 11 mm (image 7, series 2) increased from 6 mm. There is skin thickening over the LEFT breast similar prior. Nodule along the chest wall in the inferior medial aspect the LEFT breast measures 10 mm (image 39, series 2 close per increased from 7 mm. Lungs/Pleura: No suspicious nodularity. Linear atelectasis the LEFT lung base. Musculoskeletal: No aggressive osseous lesion. CT ABDOMEN AND PELVIS FINDINGS Hepatobiliary: No focal hepatic lesion. Postcholecystectomy. No biliary dilatation. Pancreas: Pancreas is normal. No ductal dilatation. No pancreatic inflammation. Spleen: Normal spleen Adrenals/urinary tract: Adrenal glands and kidneys are normal. The ureters and bladder normal. Stomach/Bowel: Stomach, small bowel, appendix, and cecum are normal. The colon and rectosigmoid colon are normal. Vascular/Lymphatic: Abdominal aorta is normal caliber. There is no retroperitoneal or periportal lymphadenopathy. No pelvic lymphadenopathy. Reproductive: Uterus and ovaries normal. Other: No peritoneal nodularity.  No ascites. Musculoskeletal: No aggressive osseous lesion. IMPRESSION: Chest Impression: 1. ENLARGED LEFT SUPRACLAVICULAR LYMPH NODE. 2. Reduction in size of malignant lymph nodes in the LEFT axilla. 3. Interval increase in size of small nodule in the inferior medial aspect of the LEFT breast along the chest wall. 4.  Stable skin thickening of the LEFT breast. 5. No evidence of pulmonary metastasis. Abdomen / Pelvis Impression: 1. No evidence of metastatic disease in the abdomen or pelvis. 2. No lymphadenopathy . 3. No skeletal metastasis. Electronically Signed   By: Suzy Bouchard M.D.   On: 06/02/2016 17:09   Ct Abdomen Pelvis W Contrast  Result Date: 06/02/2016 CLINICAL DATA:  Restaging breast cancer. Initial diagnosis 2014 with RIGHT breast cancer and mastectomy. LEFT breast cancer 2017. Ongoing chemotherapy. EXAM: CT CHEST, ABDOMEN, AND PELVIS WITH CONTRAST TECHNIQUE: Multidetector CT imaging of the chest, abdomen and pelvis was performed following the standard protocol during bolus administration of intravenous contrast. CONTRAST:  161mL ISOVUE-300 IOPAMIDOL (ISOVUE-300) INJECTION 61% COMPARISON:  None. FINDINGS: CT CHEST FINDINGS Cardiovascular: No significant vascular calcification. No pericardial fluid. Mediastinum/Nodes: Rounded LEFT axillary lymph nodes are smaller than prior. Example 13 mm lymph node decreased from Find 25 mm. LEFT supraclavicular lymph node measures 11 mm (image 7, series 2) increased from 6 mm. There is skin thickening over the LEFT breast similar prior. Nodule along the chest wall in the inferior medial aspect the LEFT breast measures 10 mm (image 39, series 2 close per increased from 7 mm. Lungs/Pleura: No suspicious nodularity. Linear atelectasis the LEFT lung base. Musculoskeletal: No aggressive osseous lesion. CT ABDOMEN AND PELVIS FINDINGS Hepatobiliary: No focal hepatic lesion. Postcholecystectomy. No biliary dilatation. Pancreas: Pancreas is normal. No ductal dilatation. No pancreatic inflammation. Spleen: Normal spleen Adrenals/urinary tract: Adrenal glands and kidneys are normal. The ureters and bladder normal. Stomach/Bowel: Stomach, small bowel, appendix, and cecum are normal. The colon and rectosigmoid colon are normal. Vascular/Lymphatic: Abdominal aorta is normal caliber. There  is no retroperitoneal or periportal lymphadenopathy. No pelvic lymphadenopathy. Reproductive: Uterus and ovaries normal. Other: No peritoneal nodularity.  No ascites. Musculoskeletal: No aggressive osseous lesion. IMPRESSION: Chest Impression: 1. ENLARGED LEFT SUPRACLAVICULAR LYMPH NODE. 2. Reduction in size of malignant lymph nodes in the LEFT axilla. 3. Interval increase in size of small nodule in the inferior medial aspect of the LEFT breast along the chest wall. 4. Stable skin thickening of the LEFT breast. 5. No evidence of pulmonary  metastasis. Abdomen / Pelvis Impression: 1. No evidence of metastatic disease in the abdomen or pelvis. 2. No lymphadenopathy . 3. No skeletal metastasis. Electronically Signed   By: Suzy Bouchard M.D.   On: 06/02/2016 17:09    ASSESSMENT: 70 y.o. [place] woman   (1)  s/p Right breast upper outer quadrant and axillary node biopsy 03/08/2012 for a clinical T4, N1-2', stage IIIB invasive ductal carcinoma, grade 3, triple negative, with an MIB-1 of 93%.  (2) definitive staging and treatment delayed as patient canceled staging studies and tried alternative treatments; with eventual progression  (3) neoadjuvant chemotherapy started 08/23/12  (a) received two cycles of neoadjuvant cyclophosphamide, docetaxel, doxorubicin ["TAC"] at standard doses with neulasta support; refused neulasta with cycles 2 and 4; tolerated treatment poorly  (b) docetaxel was held for final 4 cycles; completed cycle 6 neoadjuvant chemotherapy 12/27/2012    (4) status post right modified radical mastectomy 03/13/2013 showing no residual carcinoma in the breast, but nine of 9 axillary lymph nodes sampled involved with macro metastases (ypTX, ypN2, stage IIIA)-- repeat prognostic panel again triple negative  (5) radiation therapy completed 05/31/2013  (6) patient opted against reconstruction  METASTATIC DISEASE: (7) left axillary adenopathy noted on mammography June 2015, confirmed on CT  scan November 2015  (a) patient refused further workup or treatment on multiple visits (see notes)  (8) chest CT 12/16/2015 shows progressive left axillary and subpectoral adenopathy, left upper extremity lymphedema, left breast soft tissue fullness and skin thickening  (a) left axillary lymph node biopsy 12/26/2015 confirms metastatic carcinoma, triple negative  (9) started gemcitabine/carboplatin 01/13/2016, repeated day 1 and day 8 of each 21 cycle   (a) Day 8 cycle 2 held because of low counts  (b) restaging CT scan of the chest 04/09/2016 after 6 cycles of chemotherapy shows evidence of mixed response  (c) treatments changed to every 2 weeks beginning with 04/20/2016 dose  (10) poorly controlled hypertension: Maxide added to lisinopril and metoprolol 04/13/2016. TTS-2 added November 2017  (11) left upper extremity lymphedema  PLAN:  I discussed the staging results with the patient and she understands that while some areas appear better, others appear worse. I think if we continue the current treatment we are not going to get her where she wishes to be, which is to have relief of the left breast problem.  I am asking her surgeon Dr. Dalbert Batman to tell us whether he thinks surgery is even possible at this time. He may also wish to consider a punch biopsy of the right chest wall erythematous skin area.  My goal however would be for the patient to enroll in [facility] 1525 so she can receive pembrolizumab, which I think she would tolerate well and may be able to control her tumor. She understands if she chooses to not participate or if she does not meet entry criteria we will have to consider other chemotherapy agents.  We discussed the mechanism of action of this agent and also the mechanism of his possible toxicities and complications. This was further discussed with the patient by the research nurse. Right now the patient does not meet entry criteria because her platelets are below, but this will  resolve as she recovers from her more recent chemotherapy.  Controlling her blood pressure has been difficult. She tells me she is taking her blood pressure medications but that when she gets here she always gets very negatively excited and that brings up her blood pressure. She is going to have her blood pressure  checked at her primary care physician's office and they will fax [place] a report. In the meantime I have urged her to go ahead and get the TTS 2 patch and start on it so that we can begin the process of enrolling her in this study.  Tentatively I have rescheduled her to see me 06/24/2016, but of course I will see her earlier than that if necessary she knows to call for any problems that may develop before the next visit.      Chauncey Cruel, MD    06/05/2016

## 2016-06-05 NOTE — Telephone Encounter (Signed)
Gave patient avs report and appointments for December  °

## 2016-06-15 DIAGNOSIS — I10 Essential (primary) hypertension: Secondary | ICD-10-CM | POA: Diagnosis not present

## 2016-06-15 DIAGNOSIS — C773 Secondary and unspecified malignant neoplasm of axilla and upper limb lymph nodes: Secondary | ICD-10-CM | POA: Diagnosis not present

## 2016-06-15 DIAGNOSIS — Z9011 Acquired absence of right breast and nipple: Secondary | ICD-10-CM | POA: Diagnosis not present

## 2016-06-15 DIAGNOSIS — Z853 Personal history of malignant neoplasm of breast: Secondary | ICD-10-CM | POA: Diagnosis not present

## 2016-06-15 DIAGNOSIS — C50912 Malignant neoplasm of unspecified site of left female breast: Secondary | ICD-10-CM | POA: Diagnosis not present

## 2016-06-19 ENCOUNTER — Other Ambulatory Visit: Payer: Self-pay | Admitting: *Deleted

## 2016-06-19 ENCOUNTER — Encounter: Payer: Self-pay | Admitting: Adult Health

## 2016-06-19 ENCOUNTER — Telehealth: Payer: Self-pay | Admitting: *Deleted

## 2016-06-19 MED ORDER — OXYCODONE-ACETAMINOPHEN 5-325 MG PO TABS: 1.0000 | ORAL_TABLET | Freq: Four times a day (QID) | ORAL | 0 refills | Status: DC | PRN

## 2016-06-19 NOTE — Telephone Encounter (Signed)
Rodneshia called to this RN to state she is continuing to have increase in pain from known tumor in breast - she is awaiting surgery under Dr Dalbert Batman.  Per inquiry she is currently taking 2 hydrocodone every 4 hours with break through pain.  Husband can pick up prescription today.  Pt understands need to continue bowel prophylaxis due to concerns with constipation.

## 2016-06-19 NOTE — Progress Notes (Signed)
Prescription signed for Ms. Russon in Dr. Virgie Dad absence for Percocet.  -Oxycodone/APAP 5/325 mg po Q6Hprn, #60, no refills.   Below is the patient's [place] Controlled Substance Reporting System data as of 06/19/16     Mike Craze, NP [place] [phone]                        Review of Systems - Oncology  Physical Exam

## 2016-06-25 ENCOUNTER — Other Ambulatory Visit: Payer: Self-pay | Admitting: *Deleted

## 2016-06-25 MED ORDER — OXYCODONE-ACETAMINOPHEN 5-325 MG PO TABS: 1.0000 | ORAL_TABLET | Freq: Four times a day (QID) | ORAL | 0 refills | Status: DC | PRN

## 2016-06-25 MED ORDER — METHADONE HCL 5 MG PO TABS: 5.0000 mg | ORAL_TABLET | Freq: Three times a day (TID) | ORAL | 0 refills | Status: DC

## 2016-07-02 DIAGNOSIS — Z853 Personal history of malignant neoplasm of breast: Secondary | ICD-10-CM | POA: Diagnosis not present

## 2016-07-02 DIAGNOSIS — C50912 Malignant neoplasm of unspecified site of left female breast: Secondary | ICD-10-CM | POA: Diagnosis not present

## 2016-07-02 DIAGNOSIS — C773 Secondary and unspecified malignant neoplasm of axilla and upper limb lymph nodes: Secondary | ICD-10-CM | POA: Diagnosis not present

## 2016-07-09 ENCOUNTER — Other Ambulatory Visit: Payer: Self-pay | Admitting: General Surgery

## 2016-07-09 DIAGNOSIS — C50912 Malignant neoplasm of unspecified site of left female breast: Secondary | ICD-10-CM | POA: Diagnosis not present

## 2016-07-09 DIAGNOSIS — Z9011 Acquired absence of right breast and nipple: Secondary | ICD-10-CM | POA: Diagnosis not present

## 2016-07-09 DIAGNOSIS — C773 Secondary and unspecified malignant neoplasm of axilla and upper limb lymph nodes: Secondary | ICD-10-CM | POA: Diagnosis not present

## 2016-07-09 DIAGNOSIS — Z853 Personal history of malignant neoplasm of breast: Secondary | ICD-10-CM | POA: Diagnosis not present

## 2016-07-09 DIAGNOSIS — I10 Essential (primary) hypertension: Secondary | ICD-10-CM | POA: Diagnosis not present

## 2016-07-10 NOTE — Telephone Encounter (Signed)
NO ENTRY 

## 2016-07-15 ENCOUNTER — Ambulatory Visit (HOSPITAL_BASED_OUTPATIENT_CLINIC_OR_DEPARTMENT_OTHER): Payer: Medicare Other | Admitting: Nurse Practitioner

## 2016-07-15 ENCOUNTER — Ambulatory Visit (HOSPITAL_BASED_OUTPATIENT_CLINIC_OR_DEPARTMENT_OTHER): Payer: Medicare Other

## 2016-07-15 ENCOUNTER — Other Ambulatory Visit: Payer: Self-pay | Admitting: *Deleted

## 2016-07-15 VITALS — BP 144/64 | HR 69 | Temp 98.5°F | Resp 17 | Ht 66.5 in | Wt 134.3 lb

## 2016-07-15 DIAGNOSIS — R21 Rash and other nonspecific skin eruption: Secondary | ICD-10-CM

## 2016-07-15 DIAGNOSIS — R0602 Shortness of breath: Secondary | ICD-10-CM

## 2016-07-15 DIAGNOSIS — C50411 Malignant neoplasm of upper-outer quadrant of right female breast: Secondary | ICD-10-CM | POA: Diagnosis not present

## 2016-07-15 DIAGNOSIS — N6320 Unspecified lump in the left breast, unspecified quadrant: Secondary | ICD-10-CM

## 2016-07-15 LAB — CBC WITH DIFFERENTIAL/PLATELET
BASO%: 0.2 % (ref 0.0–2.0)
Basophils Absolute: 0 10*3/uL (ref 0.0–0.1)
EOS%: 0.6 % (ref 0.0–7.0)
Eosinophils Absolute: 0 10*3/uL (ref 0.0–0.5)
HCT: 34.3 % — ABNORMAL LOW (ref 34.8–46.6)
HGB: 11.1 g/dL — ABNORMAL LOW (ref 11.6–15.9)
LYMPH#: 1.2 10*3/uL (ref 0.9–3.3)
LYMPH%: 17.8 % (ref 14.0–49.7)
MCH: 33.7 pg (ref 25.1–34.0)
MCHC: 32.4 g/dL (ref 31.5–36.0)
MCV: 104.3 fL — ABNORMAL HIGH (ref 79.5–101.0)
MONO#: 1.1 10*3/uL — AB (ref 0.1–0.9)
MONO%: 17.6 % — ABNORMAL HIGH (ref 0.0–14.0)
NEUT%: 63.8 % (ref 38.4–76.8)
NEUTROS ABS: 4.1 10*3/uL (ref 1.5–6.5)
PLATELETS: 225 10*3/uL (ref 145–400)
RBC: 3.29 10*6/uL — AB (ref 3.70–5.45)
RDW: 13.8 % (ref 11.2–14.5)
WBC: 6.5 10*3/uL (ref 3.9–10.3)

## 2016-07-15 LAB — COMPREHENSIVE METABOLIC PANEL
ALT: 9 U/L (ref 0–55)
ANION GAP: 10 meq/L (ref 3–11)
AST: 16 U/L (ref 5–34)
Albumin: 3.8 g/dL (ref 3.5–5.0)
Alkaline Phosphatase: 50 U/L (ref 40–150)
BILIRUBIN TOTAL: 0.52 mg/dL (ref 0.20–1.20)
BUN: 31.6 mg/dL — ABNORMAL HIGH (ref 7.0–26.0)
CO2: 27 meq/L (ref 22–29)
CREATININE: 2 mg/dL — AB (ref 0.6–1.1)
Calcium: 9.5 mg/dL (ref 8.4–10.4)
Chloride: 99 mEq/L (ref 98–109)
EGFR: 29 mL/min/{1.73_m2} — ABNORMAL LOW (ref >90)
GLUCOSE: 126 mg/dL (ref 70–140)
Potassium: 4 mEq/L (ref 3.5–5.1)
Sodium: 135 mEq/L — ABNORMAL LOW (ref 136–145)
TOTAL PROTEIN: 8.2 g/dL (ref 6.4–8.3)

## 2016-07-15 MED ORDER — NYSTATIN 100000 UNIT/GM EX CREA: 1.0000 "application " | TOPICAL_CREAM | Freq: Two times a day (BID) | CUTANEOUS | 0 refills | Status: DC | PRN

## 2016-07-15 MED ORDER — FLUCONAZOLE 100 MG PO TABS: 100.0000 mg | ORAL_TABLET | Freq: Every day | ORAL | 0 refills | Status: DC

## 2016-07-17 ENCOUNTER — Other Ambulatory Visit: Payer: Self-pay | Admitting: *Deleted

## 2016-07-17 ENCOUNTER — Encounter: Payer: Self-pay | Admitting: Nurse Practitioner

## 2016-07-17 DIAGNOSIS — R21 Rash and other nonspecific skin eruption: Secondary | ICD-10-CM | POA: Insufficient documentation

## 2016-07-17 MED ORDER — OXYCODONE-ACETAMINOPHEN 5-325 MG PO TABS: 1.0000 | ORAL_TABLET | Freq: Four times a day (QID) | ORAL | 0 refills | Status: DC | PRN

## 2016-07-17 NOTE — Progress Notes (Signed)
SYMPTOM MANAGEMENT CLINIC    Chief Complaint: Rash  HPI:  Janice Powell 70 y.o. female diagnosed with breast cancer.  Currently undergoing observation only.  Patient is scheduled for left breast mastectomy at the beginning of the new year.    No history exists.    Review of Systems  Skin: Positive for itching and rash.  All other systems reviewed and are negative.   Past Medical History:  Diagnosis Date  . Breast cancer ([place]) 03/08/13   right breast - Invasive Ductal Carcinoma   . GERD (gastroesophageal reflux disease)    occ  . Hx antineoplastic chemotherapy    two cycles of neoadjuvant cyclophosphamide, docetaxel, doxorubicin ["TAC"] at standard doses with neulasta support; refused neulasta with cycles 2 and 4; tolerated treatment poorly (b) docetaxel was held for final 4 cycles; completed cycle 6 neoadjuvant chemotherapy 12/27/2012    . Hypertension   . Osteopenia   . Pneumonia    hx  . Primary malignant neoplasm of left breast with stage 2 nodal metastasis per American Joint Committee on Cancer 7th edition (N2) ([place]) 01/09/2016  . S/P radiation therapy    1) Right Chest Wall and IM nodes / 50 Gy in 25 fractions /2) Right Supraclavicular fossa / 50 Gy in 25 fractions/1) Right Chest Wall and IM nodes / 50 Gy in 25 fractions /2) Right Supraclavicular fossa / 50 Gy in 25 fractions/3) Right Posterior Axillary boost / 6.55 Gy in 25 fractions/4) Right Chest Wall Scar boost / 10 Gy in 5 fractions      . Wears glasses     Past Surgical History:  Procedure Laterality Date  . APPENDECTOMY  1992  . BREAST BIOPSY Right   . CHOLECYSTECTOMY  2000  . MASTECTOMY Right 03/13/2013  . MASTECTOMY MODIFIED RADICAL Right 03/13/2013   Procedure: MASTECTOMY MODIFIED RADICAL;  Surgeon: [name], MD;  Location: [place];  Service: General;  Laterality: Right;  . PORT-A-CATH REMOVAL  03/13/2013  . PORT-A-CATH REMOVAL Left 03/13/2013   Procedure: REMOVAL PORT-A-CATH;  Surgeon: [name],  MD;  Location: [place];  Service: General;  Laterality: Left;  . PORTACATH PLACEMENT  08/15/2012   Procedure: INSERTION PORT-A-CATH;  Surgeon: [name], MD;  Location: [place];  Service: General;  Laterality: Left;  . PORTACATH PLACEMENT N/A 01/09/2016   Procedure: INSERTION PORT-A-CATH;  Surgeon: [name], MD;  Location: [facility];  Service: General;  Laterality: N/A;  . [place]    has Hypertension; Hypokalemia; Breast cancer of upper-outer quadrant of right female breast ([place]); Atherosclerosis of aorta ([place]); Breast mass, left; Lymphedema of arm; Recurrent cancer of right breast ([place]); Cellulitis; and Rash on her problem list.    has No Known Allergies.  Allergies as of 07/15/2016   No Known Allergies     Medication List       Accurate as of 07/15/16 11:59 PM. Always use your most recent med list.          cloNIDine 0.2 mg/24hr patch Commonly known as:  CATAPRES-TTS-2 Place 1 patch (0.2 mg total) onto the skin once a week.   fluconazole 100 MG tablet Commonly known as:  DIFLUCAN Take 1 tablet (100 mg total) by mouth daily.   lidocaine-prilocaine cream Commonly known as:  EMLA Apply to affected area once   lisinopril 10 MG tablet Commonly known as:  PRINIVIL,ZESTRIL TAKE 1 TABLET EVERY DAY   LORazepam 0.5 MG tablet Commonly known as:  ATIVAN Take 1 tablet (  0.5 mg total) by mouth at bedtime as needed (Nausea or vomiting).   methadone 5 MG tablet Commonly known as:  DOLOPHINE Take 1 tablet (5 mg total) by mouth every 8 (eight) hours. For long acting pain relief   metoprolol succinate 50 MG 24 hr tablet Commonly known as:  TOPROL-XL TAKE 1 TABLET BY MOUTH EVERY DAY WITH OR IMMEDIATELY FOLLOWING A MEAL   nystatin cream Commonly known as:  MYCOSTATIN Apply 1 application topically 2 (two) times daily as needed.   oxyCODONE-acetaminophen 5-325 MG tablet Commonly known as:  PERCOCET/ROXICET Take 1-2 tablets by mouth every 6 (six) hours as needed  for severe pain (for breakthrough pain).   potassium chloride 10 MEQ tablet Commonly known as:  K-DUR Take 1 tablet (10 mEq total) by mouth daily.   prochlorperazine 10 MG tablet Commonly known as:  COMPAZINE Take 1 tablet (10 mg total) by mouth every 6 (six) hours as needed (Nausea or vomiting).   triamterene-hydrochlorothiazide 75-50 MG tablet Commonly known as:  MAXZIDE Take 1 tablet by mouth daily.        PHYSICAL EXAMINATION  Oncology Vitals 07/15/2016 06/05/2016  Height 169 cm 161 cm  Weight 60.918 kg 63.64 kg  Weight (lbs) 134 lbs 5 oz 140 lbs 5 oz  BMI (kg/m2) 21.35 kg/m2 24.46 kg/m2  Temp 98.5 98.1  Pulse 69 77  Resp 17 18  SpO2 98 98  BSA (m2) 1.69 m2 1.69 m2   BP Readings from Last 2 Encounters:  07/15/16 (!) 144/64  06/05/16 (!) 159/75    Physical Exam  Constitutional: She is oriented to person, place, and time and well-developed, well-nourished, and in no distress.  HENT:  Head: Normocephalic and atraumatic.  Eyes: Conjunctivae and EOM are normal. Pupils are equal, round, and reactive to light.  Neck: Normal range of motion.  Pulmonary/Chest: Effort normal. No respiratory distress.  Musculoskeletal: Normal range of motion. She exhibits no edema or tenderness.  Neurological: She is alert and oriented to person, place, and time. Gait normal.  Skin: Skin is warm and dry. Rash noted. There is erythema.  Psychiatric: Affect normal.  Nursing note and vitals reviewed.   LABORATORY DATA:. Appointment on 07/15/2016  Component Date Value Ref Range Status  . WBC 07/15/2016 6.5  3.9 - 10.3 10e3/uL Final  . NEUT# 07/15/2016 4.1  1.5 - 6.5 10e3/uL Final  . HGB 07/15/2016 11.1* 11.6 - 15.9 g/dL Final  . HCT 07/15/2016 34.3* 34.8 - 46.6 % Final  . Platelets 07/15/2016 225  145 - 400 10e3/uL Final  . MCV 07/15/2016 104.3* 79.5 - 101.0 fL Final  . MCH 07/15/2016 33.7  25.1 - 34.0 pg Final  . MCHC 07/15/2016 32.4  31.5 - 36.0 g/dL Final  . RBC 07/15/2016 3.29*  3.70 - 5.45 10e6/uL Final  . RDW 07/15/2016 13.8  11.2 - 14.5 % Final  . lymph# 07/15/2016 1.2  0.9 - 3.3 10e3/uL Final  . MONO# 07/15/2016 1.1* 0.1 - 0.9 10e3/uL Final  . Eosinophils Absolute 07/15/2016 0.0  0.0 - 0.5 10e3/uL Final  . Basophils Absolute 07/15/2016 0.0  0.0 - 0.1 10e3/uL Final  . NEUT% 07/15/2016 63.8  38.4 - 76.8 % Final  . LYMPH% 07/15/2016 17.8  14.0 - 49.7 % Final  . MONO% 07/15/2016 17.6* 0.0 - 14.0 % Final  . EOS% 07/15/2016 0.6  0.0 - 7.0 % Final  . BASO% 07/15/2016 0.2  0.0 - 2.0 % Final  . Sodium 07/15/2016 135* 136 - 145 mEq/L Final  .  Potassium 07/15/2016 4.0  3.5 - 5.1 mEq/L Final  . Chloride 07/15/2016 99  98 - 109 mEq/L Final  . CO2 07/15/2016 27  22 - 29 mEq/L Final  . Glucose 07/15/2016 126  70 - 140 mg/dl Final  . BUN 07/15/2016 31.6* 7.0 - 26.0 mg/dL Final  . Creatinine 07/15/2016 2.0* 0.6 - 1.1 mg/dL Final  . Total Bilirubin 07/15/2016 0.52  0.20 - 1.20 mg/dL Final  . Alkaline Phosphatase 07/15/2016 50  40 - 150 U/L Final  . AST 07/15/2016 16  5 - 34 U/L Final  . ALT 07/15/2016 9  0 - 55 U/L Final  . Total Protein 07/15/2016 8.2  6.4 - 8.3 g/dL Final  . Albumin 07/15/2016 3.8  3.5 - 5.0 g/dL Final  . Calcium 07/15/2016 9.5  8.4 - 10.4 mg/dL Final  . Anion Gap 07/15/2016 10  3 - 11 mEq/L Final  . EGFR 07/15/2016 29* >90 ml/min/1.73 m2 Final         RADIOGRAPHIC STUDIES: No results found.  ASSESSMENT/PLAN:    Rash Patient presented to the cancer Center symptom management clinic today with complaint of the skin to her bilateral chest wall feeling tight and pruritic.  Patient has already underwent a right breast mastectomy in the past which is well-healed.  Patient now has left breast cancer; and is scheduled for a mastectomy to the left side on 07/28/2016.  She has an open fungating wound to the left breast area.  Patient denies any recent fevers or chills.  Exam of the entire chest wall reveals right breast mastectomy surgical site  well-healed with no evidence of infection.  Left breast surrounding the nipple area with a fungating, open wound with a bandage dried to the site.  Saline was used to remove the dressing; and a new Vaseline gauze dressing was applied.  Also, patient was given additional dressing supplies that will hopefully last until she has her surgery.  Patient has a red, dried rash to both the right breast mastectomy site and the left breast site as well.  It appears to be a fungal/yeast rash.  There is no edema, no warmth, and no red streaks to the site.  Will prescribe Diflucan orally and also prescribed nystatin to apply to the site topically.  Patient was advised to call/return or go directly to the emergency department for any worsening symptoms whatsoever.  Breast mass, left Patient is currently undergoing observation only.  She has already underwent a right breast mastectomy in the past-which is well-healed.  She is scheduled for a left breast mastectomy on 07/28/2016.  She is scheduled for labs and a follow-up visit on 07/24/2016.   Patient stated understanding of all instructions; and was in agreement with this plan of care. The patient knows to call the clinic with any problems, questions or concerns.   Total time spent with patient was 25 minutes;  with greater than 75 percent of that time spent in face to face counseling regarding patient's symptoms,  and coordination of care and follow up.  Disclaimer:This dictation was prepared with Dragon/digital dictation along with [employer]. Any transcriptional errors that result from this process are unintentional.  Drue Second, NP 07/17/2016

## 2016-07-17 NOTE — Assessment & Plan Note (Signed)
Patient is currently undergoing observation only.  She has already underwent a right breast mastectomy in the past-which is well-healed.  She is scheduled for a left breast mastectomy on 07/28/2016.  She is scheduled for labs and a follow-up visit on 07/24/2016.

## 2016-07-17 NOTE — Assessment & Plan Note (Signed)
Patient presented to the cancer Center symptom management clinic today with complaint of the skin to her bilateral chest wall feeling tight and pruritic.  Patient has already underwent a right breast mastectomy in the past which is well-healed.  Patient now has left breast cancer; and is scheduled for a mastectomy to the left side on 07/28/2016.  She has an open fungating wound to the left breast area.  Patient denies any recent fevers or chills.  Exam of the entire chest wall reveals right breast mastectomy surgical site well-healed with no evidence of infection.  Left breast surrounding the nipple area with a fungating, open wound with a bandage dried to the site.  Saline was used to remove the dressing; and a new Vaseline gauze dressing was applied.  Also, patient was given additional dressing supplies that will hopefully last until she has her surgery.  Patient has a red, dried rash to both the right breast mastectomy site and the left breast site as well.  It appears to be a fungal/yeast rash.  There is no edema, no warmth, and no red streaks to the site.  Will prescribe Diflucan orally and also prescribed nystatin to apply to the site topically.  Patient was advised to call/return or go directly to the emergency department for any worsening symptoms whatsoever.

## 2016-07-21 ENCOUNTER — Telehealth: Payer: Self-pay | Admitting: *Deleted

## 2016-07-21 NOTE — Telephone Encounter (Signed)
TCT patient to follow up with her re: [facility] visit on 07/15/16.  Pt states her skin across her breasts still feels tight and uncomfortable. Uses pain meds as needed, as well as difucan and Nyastatin cream. She has lab and MD (Dr. Jana Hakim)  visit on 07/24/16.  She will talk to Dr. Jana Hakim about this then. Denies need to come here sooner.   She is scheduled for modified radical mastectomy on 07/28/16.

## 2016-07-21 NOTE — Pre-Procedure Instructions (Signed)
Janice Powell  07/21/2016      [id number] - , [place] [address] [place] [address] Phone: [phone] Fax: [phone]    Your procedure is scheduled on Tuesday January 2.  Report to [facility] Admitting at 11:15 A.M.  Call this number if you have problems the morning of surgery:  [phone]   Remember:  Do not eat food or drink liquids after midnight.  Take these medicines the morning of surgery with A SIP OF WATER: metoprolol (toprol-XL), oxycodone (percocet) if needed  7 days prior to surgery STOP taking any Aspirin, Aleve, Naproxen, Ibuprofen, Motrin, Advil, Goody's, BC's, all herbal medications, fish oil, and all vitamins    Do not wear jewelry, make-up or nail polish.  Do not wear lotions, powders, or perfumes, or deoderant.  Do not shave 48 hours prior to surgery.  Men may shave face and neck.  Do not bring valuables to the hospital.  [facility] is not responsible for any belongings or valuables.  Contacts, dentures or bridgework may not be worn into surgery.  Leave your suitcase in the car.  After surgery it may be brought to your room.  For patients admitted to the hospital, discharge time will be determined by your treatment team.  Patients discharged the day of surgery will not be allowed to drive home.    Special instructions:    [facility]- Preparing For Surgery  Before surgery, you can play an important role. Because skin is not sterile, your skin needs to be as free of germs as possible. You can reduce the number of germs on your skin by washing with CHG (chlorahexidine gluconate) Soap before surgery.  CHG is an antiseptic cleaner which kills germs and bonds with the skin to continue killing germs even after washing.  Please do not use if you have an allergy to CHG or antibacterial soaps. If your skin becomes reddened/irritated stop using the CHG.  Do not  shave (including legs and underarms) for at least 48 hours prior to first CHG shower. It is OK to shave your face.  Please follow these instructions carefully.   1. Shower the NIGHT BEFORE SURGERY and the MORNING OF SURGERY with CHG.   2. If you chose to wash your hair, wash your hair first as usual with your normal shampoo.  3. After you shampoo, rinse your hair and body thoroughly to remove the shampoo.  4. Use CHG as you would any other liquid soap. You can apply CHG directly to the skin and wash gently with a scrungie or a clean washcloth.   5. Apply the CHG Soap to your body ONLY FROM THE NECK DOWN.  Do not use on open wounds or open sores. Avoid contact with your eyes, ears, mouth and genitals (private parts). Wash genitals (private parts) with your normal soap.  6. Wash thoroughly, paying special attention to the area where your surgery will be performed.  7. Thoroughly rinse your body with warm water from the neck down.  8. DO NOT shower/wash with your normal soap after using and rinsing off the CHG Soap.  9. Pat yourself dry with a CLEAN TOWEL.   10. Wear CLEAN PAJAMAS   11. Place CLEAN SHEETS on your bed the night of your first shower and DO NOT SLEEP WITH PETS.    Day of Surgery: Do not apply any deodorants/lotions. Please wear clean clothes to the hospital/surgery  center.

## 2016-07-22 ENCOUNTER — Encounter (HOSPITAL_COMMUNITY)
Admission: RE | Admit: 2016-07-22 | Discharge: 2016-07-22 | Disposition: A | Discharge status: Home / Self Care | Payer: Medicare Other | Source: Ambulatory Visit | Attending: General Surgery | Admitting: General Surgery

## 2016-07-22 ENCOUNTER — Encounter (HOSPITAL_COMMUNITY): Payer: Self-pay

## 2016-07-22 ENCOUNTER — Ambulatory Visit (HOSPITAL_COMMUNITY)
Admission: RE | Admit: 2016-07-22 | Discharge: 2016-07-22 | Disposition: A | Discharge status: Home / Self Care | Payer: Medicare Other | Source: Ambulatory Visit | Attending: General Surgery | Admitting: General Surgery

## 2016-07-22 DIAGNOSIS — Z01818 Encounter for other preprocedural examination: Secondary | ICD-10-CM

## 2016-07-22 DIAGNOSIS — Z01812 Encounter for preprocedural laboratory examination: Secondary | ICD-10-CM | POA: Diagnosis not present

## 2016-07-22 LAB — COMPREHENSIVE METABOLIC PANEL
ALK PHOS: 40 U/L (ref 38–126)
ALT: 10 U/L — ABNORMAL LOW (ref 14–54)
ANION GAP: 11 (ref 5–15)
AST: 19 U/L (ref 15–41)
Albumin: 3.9 g/dL (ref 3.5–5.0)
BILIRUBIN TOTAL: 0.5 mg/dL (ref 0.3–1.2)
BUN: 34 mg/dL — ABNORMAL HIGH (ref 6–20)
CALCIUM: 9.5 mg/dL (ref 8.9–10.3)
CO2: 24 mmol/L (ref 22–32)
CREATININE: 1.93 mg/dL — AB (ref 0.44–1.00)
Chloride: 98 mmol/L — ABNORMAL LOW (ref 101–111)
GFR calc non Af Amer: 25 mL/min — ABNORMAL LOW (ref >60)
GFR, EST AFRICAN AMERICAN: 29 mL/min — AB (ref >60)
Glucose, Bld: 121 mg/dL — ABNORMAL HIGH (ref 65–99)
Potassium: 4.2 mmol/L (ref 3.5–5.1)
Sodium: 133 mmol/L — ABNORMAL LOW (ref 135–145)
TOTAL PROTEIN: 8.4 g/dL — AB (ref 6.5–8.1)

## 2016-07-22 LAB — CBC WITH DIFFERENTIAL/PLATELET
BASOS PCT: 0 %
Basophils Absolute: 0 10*3/uL (ref 0.0–0.1)
EOS PCT: 0 %
Eosinophils Absolute: 0 10*3/uL (ref 0.0–0.7)
HEMATOCRIT: 34.7 % — AB (ref 36.0–46.0)
HEMOGLOBIN: 11.1 g/dL — AB (ref 12.0–15.0)
LYMPHS PCT: 25 %
Lymphs Abs: 1.7 10*3/uL (ref 0.7–4.0)
MCH: 33.6 pg (ref 26.0–34.0)
MCHC: 32 g/dL (ref 30.0–36.0)
MCV: 105.2 fL — AB (ref 78.0–100.0)
MONOS PCT: 15 %
Monocytes Absolute: 1 10*3/uL (ref 0.1–1.0)
NEUTROS ABS: 4.1 10*3/uL (ref 1.7–7.7)
Neutrophils Relative %: 60 %
Platelets: 227 10*3/uL (ref 150–400)
RBC: 3.3 MIL/uL — ABNORMAL LOW (ref 3.87–5.11)
RDW: 13.8 % (ref 11.5–15.5)
WBC: 6.8 10*3/uL (ref 4.0–10.5)

## 2016-07-22 LAB — PROTIME-INR
INR: 0.96
Prothrombin Time: 12.7 seconds (ref 11.4–15.2)

## 2016-07-22 NOTE — Progress Notes (Signed)
PCP - Deland Pretty Cardiologist - denies  Chest x-ray - 07/22/16 EKG - 01/07/16 Stress Test - denies ECHO - 08/19/12 Cardiac Cath - denies  Right mastectomy in 2014 lymph nodes removed Left mastectomy 2018    Patient denies shortness of breath, fever, cough and chest pain at PAT appointment

## 2016-07-22 NOTE — Progress Notes (Signed)
Anesthesia chart review: Patient is a 70 year old female scheduled for left modified radical mastectomy (Dr. Dalbert Batman), skin graft split thickness from left thigh to left chest, possible adjacent tissue transfer abdomen to chest, possible fasciocutaneous flap from abdomen to just, possible A Cell application to left thigh, application wound VAC (Dr. Iran Planas) on 07/28/16. She has recurrent triple negative breast cancer.  History includes left breast cancer s/p radiation 12/2015 and chemotherapy, right breast cancer s/p right modified radical mastectomy 03/13/13 and chemoradiation, left breast cancer (left axillary LN biopsy confirmed metastatic carcinoma s/p gemcitabine/carboplatin (started 01/13/16), LUE lymphedema, insertion of Port-a-cath 01/09/16, HTN, GERD, appendectomy, cholecystectomy.  PCP is Dr. Deland Pretty. HEM-ONC is Dr. Jana Hakim. Last visit 07/15/16 with Drue Second, NP who is aware of surgery plans. Treatment at that time was listed as "observation only." Patient has an open fungating wound to the left breast area. She was started on Diflucan for bilateral breast rash. She has a follow-up appointment on 07/24/16.  Meds include Catapres patch, Diflucan, lisinopril, Ativan, methadone, Toprol, Percocet, KCl when necessary, Maxide. (Maxzide added 04/13/16 for better BP control. Clonidine TTS-2 patch added 06/01/16.)  BP (!) 146/64   Pulse 92   Temp 36.8 C   Resp 18   Ht 5' 3.5" (1.613 m)   Wt 131 lb 14.4 oz (59.8 kg)   SpO2 99%   BMI 23.00 kg/m   EKG 01/07/16: SB at 57 bpm, LVH with repolarization abnormality. When compared to 08/11/2012 tracing ST changes or less pronounced.  Echo 09/11/2012: Study Conclusions Left ventricle: The cavity size was normal. Wall thickness was increased in a pattern of severe LVH. There was severe concentric hypertrophy. Systolic function was normal. The estimated ejection fraction was in the range of 60% to 65%. Wall motion was normal; there were no regional  wall motion abnormalities. Left ventricular diastolic function parameters were normal.    CXR 07/22/16: FINDINGS: Cardiomediastinal silhouette is stable. No infiltrate or pleural effusion. No pulmonary edema. There is some pleural thickening or scarring left costophrenic angle. Right Port-A-Cath with tip in right atrium. No pneumothorax. Surgical clips are noted in right axilla.    CT chest/abd/pelvis 06/02/16: IMPRESSION: Chest Impression: 1. ENLARGED LEFT SUPRACLAVICULAR LYMPH NODE. 2. Reduction in size of malignant lymph nodes in the LEFT axilla. 3. Interval increase in size of small nodule in the inferior medial aspect of the LEFT breast along the chest wall. 4. Stable skin thickening of the LEFT breast. 5. No evidence of pulmonary metastasis. 6. No significant vascular calcifications. No pericardial fluid. Abdomen / Pelvis Impression: 1. No evidence of metastatic disease in the abdomen or pelvis. 2. No lymphadenopathy . 3. No skeletal metastasis.  Preoperative labs noted. WBC 6.8, H/H 11.1/34.7. Glucose 121. BUN 34, Cr 1.93, previously 31.6/2.0 on 07/15/16 and 26.6/1.4 on 06/02/16. Between 03/16/16-05/20/16 her creatinine went from 0.8 to 1.6--prior to 05/20/16, her Cr was 0.80 dating as far back as 2015. Dr. Jana Hakim noted that it could be do BP medication and added fluids to her treatments and has otherwise been monitoring with follow-up labs.   Discussed above with anesthesiologist Dr. Orene Desanctis. Patient with recurrent left breast cancer with open wound. EKG is abnormal, but stable since at least 2014 when she underwent right mastectomy. BP is better controlled (SBP was > 200 when she was re-diagnosed with breast cancer), but now Cr is elevated over the past six months. BP medications and/or chemotherapy could have been contributors. Based on 07/15/16 note, I don't think she is undergoing chemotherapy currently. If  no acute changes Dr. Orene Desanctis felt patient could proceed as  planned. Patient to see Dr. Jana Hakim for labs and breast rash re-evaluation on 07/24/16. Will leave chart for follow-up with Willeen Cass, FNP-BC as I will be out of town.   [facility] Short Stay Center/Anesthesiology Phone [phone] 07/22/2016 5:59 PM

## 2016-07-23 NOTE — H&P (Signed)
Subjective:     Patient ID: Janice Powell is a 70 y.o. female.  HPI  Patient of Drs. Dalbert Batman and Magrinat here for consultation. Patient with history RIGHT breast cancer triple negative that presented as palpable mass in 2011 and patient did not seek treatment until 2013. She initially declined chemotherapy but eventually accepted this. She underwent RIGHT MRM 02/2013 with complete pathologic response in the breast, 9/9 LN +. She completed radiation therapy.  LEFT axillary adenopathy was noted on MMG in June 2015 and confirmed on CT scan November 2015 but the patient refused further workup or treatment. She experienced progression of disease and ultimately had biopsy LEFT axillary 12/2015 confirming metastatic carcinoma, triple negative. She has been undergoing neoadjuvant chemotherapy and plan for MRM to be followed by radiation. She is here for consideration options for closure/coverage anticipated wound.  Most recent scan 06/02/16 with reduction in size left axillary nodes and increase in size left breast lateral nodular area and skin thickening.  Has chronic pain from tumor and lymphedema and on Percocet and methadone managed by Dr. Jana Hakim.  PMH also significant for poorly controlled HTN- states added two additional medications and this has improved.   Lives with husband and granddaughter. She is not accompanied at today's visit.  Review of Systems     Objective:   Physical Exam  Cardiovascular: Normal rate, regular rhythm and normal heart sounds.   Pulmonary/Chest: Effort normal and breath sounds normal.  Abdominal: Soft.  + umbilical hernia, redundant skin with small panniculus  Chest: right chest post mastectomy, flat, some erythema at scar margins- per patient this is a "rash" and treating with topical hydrocortisone  Left breat firm throughout entire breast with ulceration and bleeding present- anticipated defect from measurement of indurated area appr 18 x 20 cm     Assessment:     Left breast ca, metastatic, ulcerated, triple negative Neoadjuvant chemotherapy Hx right breast ca, s/p MRM, XRT    Plan:     Discussed with patient all areas of firmness will be resected and this will leave large open area. Discussed options for anticipated open wound post MRM including local wound care- counseled this would be prolonged and may delay adjuvant radiation (which she states she will likely accept). Discussed STSG- reviewed donor site pain, healing, use of VAC as bolster for 5-7 days, patch like appearance, partial take graft, need for additional procedures. Discussed that a STSG will experience break down more readily with radiation. Discussed local thoracoabdominal flaps- counseled may tolerate adjuvant radiation better but may not be sufficient amount to cover wound entirely and will still require STSG for wound or donor site. Discussed latissimus flap vs TRAM- given the large area involved do not feel the skin paddle on either will be able to sufficiently cover the defect. A muscle flap would be beneficial if pectoralis is resected and base of wound is rib cage. Reviewed this would also be a larger surgery, more recovery.   At this time will plan STSG, possible thoracoabdominal fasciocutaneous flaps and possible application A Cell to thigh donor site. Reviewed expected hospital stay- anticipate will need home VAC.     She declined additional preop visit so that her husband could be present  Irene Limbo, MD [facility] [phone], pin [phone]

## 2016-07-24 ENCOUNTER — Emergency Department (HOSPITAL_COMMUNITY): Payer: Medicare Other

## 2016-07-24 ENCOUNTER — Other Ambulatory Visit: Payer: Medicare Other

## 2016-07-24 ENCOUNTER — Other Ambulatory Visit: Payer: Self-pay

## 2016-07-24 ENCOUNTER — Encounter (HOSPITAL_COMMUNITY): Payer: Self-pay | Admitting: *Deleted

## 2016-07-24 ENCOUNTER — Ambulatory Visit: Payer: Medicare Other | Admitting: Oncology

## 2016-07-24 ENCOUNTER — Observation Stay (HOSPITAL_COMMUNITY)
Admission: EM | Admit: 2016-07-24 | Discharge: 2016-07-25 | Disposition: A | Discharge status: Home / Self Care | Payer: Medicare Other | Attending: Internal Medicine | Admitting: Internal Medicine

## 2016-07-24 DIAGNOSIS — N183 Chronic kidney disease, stage 3 (moderate): Secondary | ICD-10-CM | POA: Diagnosis not present

## 2016-07-24 DIAGNOSIS — I890 Lymphedema, not elsewhere classified: Secondary | ICD-10-CM

## 2016-07-24 DIAGNOSIS — E860 Dehydration: Secondary | ICD-10-CM

## 2016-07-24 DIAGNOSIS — J90 Pleural effusion, not elsewhere classified: Secondary | ICD-10-CM | POA: Diagnosis not present

## 2016-07-24 DIAGNOSIS — M8580 Other specified disorders of bone density and structure, unspecified site: Secondary | ICD-10-CM | POA: Diagnosis not present

## 2016-07-24 DIAGNOSIS — J189 Pneumonia, unspecified organism: Principal | ICD-10-CM | POA: Insufficient documentation

## 2016-07-24 DIAGNOSIS — R000 Tachycardia, unspecified: Secondary | ICD-10-CM | POA: Insufficient documentation

## 2016-07-24 DIAGNOSIS — R0602 Shortness of breath: Secondary | ICD-10-CM | POA: Diagnosis not present

## 2016-07-24 DIAGNOSIS — Z853 Personal history of malignant neoplasm of breast: Secondary | ICD-10-CM | POA: Insufficient documentation

## 2016-07-24 DIAGNOSIS — Z9221 Personal history of antineoplastic chemotherapy: Secondary | ICD-10-CM | POA: Insufficient documentation

## 2016-07-24 DIAGNOSIS — Z923 Personal history of irradiation: Secondary | ICD-10-CM | POA: Insufficient documentation

## 2016-07-24 DIAGNOSIS — K219 Gastro-esophageal reflux disease without esophagitis: Secondary | ICD-10-CM | POA: Diagnosis not present

## 2016-07-24 DIAGNOSIS — R079 Chest pain, unspecified: Secondary | ICD-10-CM | POA: Diagnosis not present

## 2016-07-24 DIAGNOSIS — I129 Hypertensive chronic kidney disease with stage 1 through stage 4 chronic kidney disease, or unspecified chronic kidney disease: Secondary | ICD-10-CM | POA: Insufficient documentation

## 2016-07-24 DIAGNOSIS — N6320 Unspecified lump in the left breast, unspecified quadrant: Secondary | ICD-10-CM | POA: Diagnosis present

## 2016-07-24 DIAGNOSIS — Z9011 Acquired absence of right breast and nipple: Secondary | ICD-10-CM | POA: Insufficient documentation

## 2016-07-24 DIAGNOSIS — N179 Acute kidney failure, unspecified: Secondary | ICD-10-CM | POA: Diagnosis not present

## 2016-07-24 DIAGNOSIS — R0789 Other chest pain: Secondary | ICD-10-CM | POA: Diagnosis not present

## 2016-07-24 DIAGNOSIS — R531 Weakness: Secondary | ICD-10-CM | POA: Diagnosis not present

## 2016-07-24 DIAGNOSIS — R404 Transient alteration of awareness: Secondary | ICD-10-CM | POA: Diagnosis not present

## 2016-07-24 LAB — CBC WITH DIFFERENTIAL/PLATELET
BASOS PCT: 0 %
Basophils Absolute: 0 10*3/uL (ref 0.0–0.1)
EOS ABS: 0 10*3/uL (ref 0.0–0.7)
Eosinophils Relative: 0 %
HEMATOCRIT: 34 % — AB (ref 36.0–46.0)
Hemoglobin: 11.5 g/dL — ABNORMAL LOW (ref 12.0–15.0)
LYMPHS ABS: 0.7 10*3/uL (ref 0.7–4.0)
Lymphocytes Relative: 7 %
MCH: 34 pg (ref 26.0–34.0)
MCHC: 33.8 g/dL (ref 30.0–36.0)
MCV: 100.6 fL — ABNORMAL HIGH (ref 78.0–100.0)
MONO ABS: 0.9 10*3/uL (ref 0.1–1.0)
MONOS PCT: 10 %
Neutro Abs: 7.8 10*3/uL — ABNORMAL HIGH (ref 1.7–7.7)
Neutrophils Relative %: 83 %
Platelets: 246 10*3/uL (ref 150–400)
RBC: 3.38 MIL/uL — ABNORMAL LOW (ref 3.87–5.11)
RDW: 13.2 % (ref 11.5–15.5)
WBC: 9.4 10*3/uL (ref 4.0–10.5)

## 2016-07-24 LAB — BASIC METABOLIC PANEL
Anion gap: 12 (ref 5–15)
BUN: 41 mg/dL — ABNORMAL HIGH (ref 6–20)
CALCIUM: 9.2 mg/dL (ref 8.9–10.3)
CHLORIDE: 95 mmol/L — AB (ref 101–111)
CO2: 23 mmol/L (ref 22–32)
CREATININE: 1.87 mg/dL — AB (ref 0.44–1.00)
GFR calc Af Amer: 30 mL/min — ABNORMAL LOW (ref >60)
GFR calc non Af Amer: 26 mL/min — ABNORMAL LOW (ref >60)
GLUCOSE: 116 mg/dL — AB (ref 65–99)
Potassium: 3.7 mmol/L (ref 3.5–5.1)
Sodium: 130 mmol/L — ABNORMAL LOW (ref 135–145)

## 2016-07-24 LAB — I-STAT TROPONIN, ED: TROPONIN I, POC: 0 ng/mL (ref 0.00–0.08)

## 2016-07-24 LAB — TROPONIN I: Troponin I: 0.05 ng/mL (ref <0.03)

## 2016-07-24 MED ORDER — ACETAMINOPHEN 650 MG RE SUPP
650.0000 mg | Freq: Four times a day (QID) | RECTAL | Status: DC | PRN
Start: 2016-07-24 — End: 2016-07-25

## 2016-07-24 MED ORDER — SODIUM CHLORIDE 0.9% FLUSH
3.0000 mL | Freq: Two times a day (BID) | INTRAVENOUS | Status: DC
  Administered 2016-07-24: 3 mL via INTRAVENOUS

## 2016-07-24 MED ORDER — METOPROLOL SUCCINATE ER 50 MG PO TB24: 50.0000 mg | ORAL_TABLET | Freq: Every day | ORAL | Status: DC

## 2016-07-24 MED ORDER — DOXYCYCLINE HYCLATE 100 MG PO TABS
100.0000 mg | ORAL_TABLET | Freq: Once | ORAL | Status: AC
  Administered 2016-07-24: 100 mg via ORAL
  Filled 2016-07-24: qty 1

## 2016-07-24 MED ORDER — LISINOPRIL 10 MG PO TABS
10.0000 mg | ORAL_TABLET | Freq: Every day | ORAL | Status: DC
  Administered 2016-07-25: 10 mg via ORAL
  Filled 2016-07-24: qty 1

## 2016-07-24 MED ORDER — OXYCODONE-ACETAMINOPHEN 5-325 MG PO TABS
1.0000 | ORAL_TABLET | Freq: Four times a day (QID) | ORAL | Status: DC | PRN
  Administered 2016-07-24: 1 via ORAL
  Administered 2016-07-25 (×2): 2 via ORAL
  Filled 2016-07-24 (×3): qty 1
  Filled 2016-07-24: qty 2

## 2016-07-24 MED ORDER — SODIUM CHLORIDE 0.9 % IV BOLUS (SEPSIS)
500.0000 mL | Freq: Once | INTRAVENOUS | Status: AC
  Administered 2016-07-24: 500 mL via INTRAVENOUS

## 2016-07-24 MED ORDER — ENOXAPARIN SODIUM 60 MG/0.6ML ~~LOC~~ SOLN
60.0000 mg | SUBCUTANEOUS | Status: DC
  Administered 2016-07-24: 18:00:00 60 mg via SUBCUTANEOUS
  Filled 2016-07-24: qty 0.6

## 2016-07-24 MED ORDER — ACETAMINOPHEN 325 MG PO TABS: 650.0000 mg | ORAL_TABLET | Freq: Four times a day (QID) | ORAL | Status: DC | PRN

## 2016-07-24 MED ORDER — NYSTATIN 100000 UNIT/GM EX CREA
1.0000 "application " | TOPICAL_CREAM | Freq: Every day | CUTANEOUS | Status: DC | PRN
  Filled 2016-07-24: qty 15

## 2016-07-24 MED ORDER — DOXYCYCLINE HYCLATE 100 MG PO TABS
100.0000 mg | ORAL_TABLET | Freq: Two times a day (BID) | ORAL | Status: DC
  Administered 2016-07-24 – 2016-07-25 (×2): 100 mg via ORAL
  Filled 2016-07-24 (×2): qty 1

## 2016-07-24 MED ORDER — TRIAMTERENE-HCTZ 75-50 MG PO TABS
1.0000 | ORAL_TABLET | Freq: Every day | ORAL | Status: DC
  Administered 2016-07-25: 1 via ORAL
  Filled 2016-07-24: qty 1

## 2016-07-24 MED ORDER — ZOLPIDEM TARTRATE 5 MG PO TABS
5.0000 mg | ORAL_TABLET | Freq: Once | ORAL | Status: AC
  Administered 2016-07-24: 5 mg via ORAL
  Filled 2016-07-24: qty 1

## 2016-07-24 MED ORDER — METOPROLOL SUCCINATE ER 50 MG PO TB24
50.0000 mg | ORAL_TABLET | Freq: Every day | ORAL | Status: DC
Start: 2016-07-25 — End: 2016-07-25
  Administered 2016-07-25: 50 mg via ORAL
  Filled 2016-07-24: qty 1

## 2016-07-24 MED ORDER — SODIUM CHLORIDE 0.9 % IV SOLN
INTRAVENOUS | Status: DC
  Administered 2016-07-24: 21:00:00 via INTRAVENOUS

## 2016-07-24 MED ORDER — TECHNETIUM TO 99M ALBUMIN AGGREGATED: 4.0000 | Freq: Once | INTRAVENOUS | Status: DC | PRN

## 2016-07-24 MED ORDER — CEFUROXIME AXETIL 500 MG PO TABS
500.0000 mg | ORAL_TABLET | Freq: Every day | ORAL | Status: DC
  Administered 2016-07-24: 500 mg via ORAL
  Filled 2016-07-24: qty 1

## 2016-07-24 MED ORDER — TECHNETIUM TC 99M DIETHYLENETRIAME-PENTAACETIC ACID: 31.0000 | Freq: Once | INTRAVENOUS | Status: DC | PRN

## 2016-07-24 NOTE — ED Notes (Signed)
Pt took 5-325 percocet from home meds per ok by Ralene Bathe, MD

## 2016-07-24 NOTE — H&P (Addendum)
History and Physical  Janice Powell [id number] DOB: 11-May-1946 DOA: 07/24/2016  PCP: Horatio Pel, MD  Patient coming from: home  Chief Complaint: shortness of breath  HPI:  70yow PMH right breast cancer, s/p mastectomy, left breast cancer with plans for surgery 07/2016 presented to ED with episode of sudden onset chest tightness, shortness of breath, dizziness. Initial evaluation included chest x-ray with a equivocal findings of atelectasis or infiltrate and intermediate probability VQ scan. Because of her clinical history, lack of typical pneumonia symptoms, started on anticoagulation with plans for observation and repeat chest x-ray in the morning to make final determination.  Today, patient had sudden onset of chest pressure, severe in nature, no specific aggravating or alleviating factors noted, with associated shortness of breath and dizziness. This improved spontaneously but was severe enough that she can to the emergency department for further evaluation. She has no history of blood clots. She's had no fever or infectious symptoms. No leg swelling or pain.  Patient reports her energy levels been somewhat poor and she has had poor appetite since Christmas. She gets winded at times when going up stairs, this does not appear to be new. She's had an intermittent cough for a month or more.  ED Course: afebile, HR 100s, SBP 120-150s, afebrile. Treated with enoxaparin. Pertinent labs: BUN up to 41, creatinine stable 1.87, normal potassium, troponin negative, Hgb stable 11.5 Imaging: CXR independently reviewed: left base atelectasis V/Q Intermediate probability for acute pulmonary embolus; there is a single triple matched left side lower lung zone defect.  Review of Systems:  Negative for fever, new visual changes, sore throat, new muscle aches, dysuria, bleeding, abdominal pain.  One episode of vomiting in the last 3 days. Not today.  Past Medical History:  Diagnosis Date    . Breast cancer ([place]) 03/08/13   right breast - Invasive Ductal Carcinoma   . GERD (gastroesophageal reflux disease)    occ  . Hx antineoplastic chemotherapy    two cycles of neoadjuvant cyclophosphamide, docetaxel, doxorubicin ["TAC"] at standard doses with neulasta support; refused neulasta with cycles 2 and 4; tolerated treatment poorly (b) docetaxel was held for final 4 cycles; completed cycle 6 neoadjuvant chemotherapy 12/27/2012    . Hypertension   . Osteopenia   . Pneumonia    hx  . 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 S/P radiation therapy    1) Right Chest Wall and IM nodes / 50 Gy in 25 fractions /2) Right Supraclavicular fossa / 50 Gy in 25 fractions/1) Right Chest Wall and IM nodes / 50 Gy in 25 fractions /2) Right Supraclavicular fossa / 50 Gy in 25 fractions/3) Right Posterior Axillary boost / 6.55 Gy in 25 fractions/4) Right Chest Wall Scar boost / 10 Gy in 5 fractions      . Wears glasses     Past Surgical History:  Procedure Laterality Date  . APPENDECTOMY  1992  . BREAST BIOPSY Right   . CHOLECYSTECTOMY  2000  . MASTECTOMY Right 03/13/2013  . MASTECTOMY MODIFIED RADICAL Right 03/13/2013   Procedure: MASTECTOMY MODIFIED RADICAL;  Surgeon: [name], MD;  Location: [facility];  Service: General;  Laterality: Right;  . PORT-A-CATH REMOVAL  03/13/2013  . PORT-A-CATH REMOVAL Left 03/13/2013   Procedure: REMOVAL PORT-A-CATH;  Surgeon: [name], MD;  Location: [facility];  Service: General;  Laterality: Left;  . PORTACATH PLACEMENT  08/15/2012  Procedure: INSERTION PORT-A-CATH;  Surgeon: [name], MD;  Location: [facility];  Service: General;  Laterality: Left;  . PORTACATH PLACEMENT N/A 01/09/2016   Procedure: INSERTION PORT-A-CATH;  Surgeon: [name], MD;  Location: [facility];  Service: General;  Laterality: N/A;  . [place]     reports that  she has never smoked. She has never used smokeless tobacco. She reports that she does not drink alcohol or use drugs. Mobility: ambulatory   No Known Allergies  Family History  Problem Relation Age of Onset  . Heart disease Mother      Prior to Admission medications   Medication Sig Start Date End Date Taking? Authorizing Provider  lisinopril (PRINIVIL,ZESTRIL) 10 MG tablet TAKE 1 TABLET EVERY DAY 01/19/16  Yes Chauncey Cruel, MD  metoprolol succinate (TOPROL-XL) 50 MG 24 hr tablet TAKE 1 TABLET BY MOUTH EVERY DAY WITH OR IMMEDIATELY FOLLOWING A MEAL 04/28/16  Yes Chauncey Cruel, MD  nystatin cream (MYCOSTATIN) Apply 1 application topically 2 (two) times daily as needed. Patient taking differently: Apply 1 application topically daily as needed (to red/irritated skin surround breast).  07/15/16  Yes Susanne Borders, NP  oxyCODONE-acetaminophen (PERCOCET/ROXICET) 5-325 MG tablet Take 1-2 tablets by mouth every 6 (six) hours as needed for severe pain (for breakthrough pain). 07/17/16  Yes Chauncey Cruel, MD  potassium chloride (K-DUR) 10 MEQ tablet Take 1 tablet (10 mEq total) by mouth daily. Patient taking differently: Take 10 mEq by mouth daily as needed (for fatigue/sluggish).  06/07/15  Yes Chauncey Cruel, MD  triamterene-hydrochlorothiazide (MAXZIDE) 75-50 MG tablet Take 1 tablet by mouth daily. 04/13/16  Yes Chauncey Cruel, MD  cloNIDine (CATAPRES-TTS-2) 0.2 mg/24hr patch Place 1 patch (0.2 mg total) onto the skin once a week. Patient not taking: Reported on 07/24/2016 06/01/16   Chauncey Cruel, MD  fluconazole (DIFLUCAN) 100 MG tablet Take 1 tablet (100 mg total) by mouth daily. Patient not taking: Reported on 07/24/2016 07/15/16   Susanne Borders, NP  lidocaine-prilocaine (EMLA) cream Apply to affected area once Patient not taking: Reported on 07/24/2016 01/10/16   Chauncey Cruel, MD  LORazepam (ATIVAN) 0.5 MG tablet Take 1 tablet (0.5 mg total) by mouth at bedtime as  needed (Nausea or vomiting). Patient not taking: Reported on 07/24/2016 01/10/16   Chauncey Cruel, MD  methadone (DOLOPHINE) 5 MG tablet Take 1 tablet (5 mg total) by mouth every 8 (eight) hours. For long acting pain relief Patient not taking: Reported on 07/24/2016 06/25/16   Chauncey Cruel, MD  prochlorperazine (COMPAZINE) 10 MG tablet Take 1 tablet (10 mg total) by mouth every 6 (six) hours as needed (Nausea or vomiting). Patient not taking: Reported on 07/24/2016 01/10/16   Chauncey Cruel, MD    Physical Exam: Vitals:   07/24/16 1229 07/24/16 1438 07/24/16 1615 07/24/16 1757  BP: 142/81 (!) 129/105 150/67 156/64  Pulse: 99 100 111 105  Resp: 13 13 15 18   Temp: 98.1 F (36.7 C)     TempSrc: Oral     SpO2: 100% 95% 97% 97%  Weight: 59.4 kg (131 lb)     Height: 5' 3.5" (1.613 m)       Constitutional:  . Appears calm and comfortable Eyes:  . Ppuils and irises appear normal . Normal lids ENMT:  . external ears, nose appear normal . grossly normal hearing Neck:  . neck appears normal, no masses . no thyromegaly Respiratory:  . CTA  bilaterally, no w/r/r.  . Respiratory effort normal.  Cardiovascular:  . RRR, no m/r/g . No LE extremity edema   Abdomen:  . Abdomen appears normal; no tenderness or masses Musculoskeletal:  . Digits/nails hands: no clubbing, cyanosis, petechiae, infection Significant left upper extremity edema (secondary to cancer per patient). No new finding per patient. . RUE, LUE, RLE, LLE   o strength and tone appear grossly normal, no atrophy, no abnormal movements Skin:  . No rashes, lesions, ulcers (breast not examined) . palpation of skin: no induration or nodules Psychiatric:  . judgement and insight appear normal . Mental status o Mood, affect appropriate  Wt Readings from Last 3 Encounters:  07/24/16 59.4 kg (131 lb)  07/22/16 59.8 kg (131 lb 14.4 oz)  07/15/16 60.9 kg (134 lb 4.8 oz)    I have personally reviewed following labs  and imaging studies  Labs on Admission:  CBC:  Recent Labs Lab 07/22/16 1413 07/24/16 1252  WBC 6.8 9.4  NEUTROABS 4.1 7.8*  HGB 11.1* 11.5*  HCT 34.7* 34.0*  MCV 105.2* 100.6*  PLT 227 0000000   Basic Metabolic Panel:  Recent Labs Lab 07/22/16 1413 07/24/16 1252  NA 133* 130*  K 4.2 3.7  CL 98* 95*  CO2 24 23  GLUCOSE 121* 116*  BUN 34* 41*  CREATININE 1.93* 1.87*  CALCIUM 9.5 9.2   Liver Function Tests:  Recent Labs Lab 07/22/16 1413  AST 19  ALT 10*  ALKPHOS 40  BILITOT 0.5  PROT 8.4*  ALBUMIN 3.9    Recent Labs Lab 07/22/16 1413  INR 0.96    Radiological Exams on Admission: Dg Chest 2 View  Result Date: 07/24/2016 CLINICAL DATA:  Chest pain EXAM: CHEST  2 VIEW COMPARISON:  07/22/2016 FINDINGS: Right Port-A-Cath remains in place, unchanged. Increasing density at the left base could reflect atelectasis or developing infiltrate. Right lung is clear. Heart is normal size. No effusions. IMPRESSION: Increasing left basilar atelectasis or infiltrate. Electronically Signed   By: Rolm Baptise M.D.   On: 07/24/2016 13:15   Nm Pulmonary Perf And Vent  Result Date: 07/24/2016 CLINICAL DATA:  70 year old female with shortness of breath and chest tightness for 2 weeks. Breast cancer. Initial encounter. EXAM: NUCLEAR MEDICINE VENTILATION - PERFUSION LUNG SCAN TECHNIQUE: Ventilation images were obtained in multiple projections using inhaled aerosol Tc-64m DTPA. Perfusion images were obtained in multiple projections after intravenous injection of Tc-32m MAA. RADIOPHARMACEUTICALS:  31 mCi Technetium-16m DTPA aerosol inhalation and 4 mCi Technetium-68m MAA IV COMPARISON:  Chest radiographs 1305 hours today. Chest CT 06/02/2016 FINDINGS: Ventilation: Heterogeneous ventilation imaging, appears in part related to GI tract contamination. However, there is a moderate size ventilation defect at the left lung base. No other ventilation defect. Perfusion: Deficit at the left lung  base, corresponding to the ventilation defect. No other perfusion defect. Chest radiographs today demonstrate a smaller area of abnormal opacity at the left lung base. IMPRESSION: Intermediate probability for acute pulmonary embolus; there is a single triple matched left side lower lung zone defect. Electronically Signed   By: Genevie Ann M.D.   On: 07/24/2016 15:59    EKG: Independently reviewed: Sinus tachycardia, LVH with repolarization abnormality, inferolateral ST depression, old. No significant change compared to last study 12/2015  Principal Problem:   SOB (shortness of breath) Active Problems:   Breast mass, left   Lymphedema of arm   Dehydration   Assessment/Plan 1. SOB, PE vs pneumonia. White blood cell count is normal, blood pressure stable  but noted to be tachycardic. No infectious symptoms. Her history does not suggest pneumonia. Her chest x-ray is abnormal, wonder whether this is artifact or atelectasis or even breast tumor. VQ scan is intermediate in probability. She has no history of venous thromboembolism but certainly is at high risk. She spent quite a bit of time in the bed over the last week. EKG nonacute, troponin negative, ACS not suspected.  For now will continue anticoagulation and continue empiric oral antibiotics.   Repeat chest x-ray in the morning. If this clearly shows pneumonia, then would likely discontinue anticoagulation. However in the absence of clear pneumonia, may need to continue with anticoagulation. Creatinine elevated compared to last month.  IV hydration overnight. Creatinine improves, could proceed with CT angiogram as definitive study.  Bilateral lower extremity venous Doppler, left upper extremity venous Doppler to exclude DVT.  2. Possible dehydration. Creatinine elevated compared to last month. Plan IV fluids. Recheck BMP in a.m.  3. Breast cancer.  It is my clinical opinion that referral for OBSERVATION is reasonable and necessary in this  patient  . presenting with symptoms of shortness of breath, dizziness and chest pressure, concerning for pulmonary embolism or pneumonia . in the context of PMH including breast cancer . with pertinent physical exam findings of left upper extremity edema . and pertinent radiographic and laboratory data including intermediate probability VQ scan..  The aforementioned taken together are felt to place the patient at high risk for further clinical deterioration. However it is anticipated that the patient may be medically stable for discharge from the hospital within 24 to 48 hours.   DVT prophylaxis:Lovenox Code Status: full code Family Communication: husband Consults called: none    Time spent: 53 minutes  Murray Hodgkins, MD  [facility] Direct contact: [phone] --[place] [place]  --[email or web address]; password [email or web address]  7PM-7AM contact night coverage as above  07/24/2016, 7:16 PM

## 2016-07-24 NOTE — ED Triage Notes (Signed)
Per [facility] pt reports ShoB, weakness, and panic attack. Pt reports that [facility] and panic attack subsided before EMS arrived. Pt reports intermittent tightness in chest. Pt reports being scheduled for a L mastectomy 07/28/2016; pt had R mastectomy 3 yrs ago. Pt completed chemo 2 months ago.

## 2016-07-24 NOTE — Progress Notes (Signed)
Patient ID: Janice Powell, female   DOB: 10/02/1945, 70 y.o.   MRN: [id number] ID: Janice Powell   DOB: June 07, 1946  MR#: [id number]  [id number]  PCP: Horatio Pel, MD GYN:  SUFanny Skates MD OTHER MD: Eppie Gibson, MD  CHIEF COMPLAINT:  Hx of Triple Negative Right Breast Cancer; triple negative left axillary adenopathy  CURRENT TREATMENT: considering pembrolizumab  INTERVAL HISTORY:   Janice Powell returns today for follow-up of her recurrent triple negative breast cancer. Her research nurse Otilio Miu wa also present during today's visit.  Today is day 17 cycle 5 of carboplatin and gemcitabine, which Janice Powell generally well. We are just restage her with CT scans of the chest abdomen and pelvis, and this shows some evidence of response but also evidence of disease progression.   In preparation for consideration of the 1521 study I wrote a prescription for TTS 2, but the patient has not purchased this medication yet because she wanted to discuss it first.  REVIEW OF SYSTEMS: She is having some bleeding from an ulcerated spot in her left breast. She denies reverse, drenching sweats, or unusual fatigue. She is having more pain. She denies unusual headaches, visual changes, nausea, or vomiting has been no change in bowel or bladder habits. She has some itching in the right chest wall. A detailed review of systems was otherwise stable   HISTORY OF PRESENT ILLNESS: From the original intake note:  Janice Powell noted a mass in her right breast sometime in 2011. 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 More recently her husband twisted her arm to get the mass looked at, and she brought it to Dr. Pennie Banter attention. Diagnostic mammography and right ultrasonography at [facility] 03/02/2012 showed a 6.78 cm irregular mass in the inner aspect of the right breast. There was nipple retraction and erythema around the nipple. The largest lymph  node was noted in the right axilla. By ultrasound the large irregular hypoechoic mass was noted in the breast with multiple enlarged axillary lymph nodes. Physical exam confirmed a hard breast with erythema around the right nipple extending inferiorly. The nipple is slightly inverted.  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   Her subsequent history is as detailed below  PAST MEDICAL HISTORY: Past Medical History:  Diagnosis Date  . Breast cancer ([place]) 03/08/13   right breast - Invasive Ductal Carcinoma   . GERD (gastroesophageal reflux disease)    occ  . Hx antineoplastic chemotherapy    two cycles of neoadjuvant cyclophosphamide, docetaxel, doxorubicin ["TAC"] at standard doses with neulasta support; refused neulasta with cycles 2 and 4; Powell treatment poorly (b) docetaxel was held for final 4 cycles; completed cycle 6 neoadjuvant chemotherapy 12/27/2012    . Hypertension   . Osteopenia   . Pneumonia    hx  . Primary malignant neoplasm of left breast with stage 2 nodal metastasis per American Joint Committee on Cancer 7th edition (N2) ([place]) 01/09/2016  . S/P radiation therapy    1) Right Chest Wall and IM nodes / 50 Gy in 25 fractions /2) Right Supraclavicular fossa / 50 Gy in 25 fractions/1) Right Chest Wall and IM nodes / 50 Gy in 25 fractions /2) Right Supraclavicular fossa / 50 Gy in 25 fractions/3) Right Posterior Axillary boost / 6.55 Gy in 25 fractions/4) Right Chest Wall Scar boost / 10 Gy in 5  fractions      . Wears glasses   GERD osteopenia  PAST SURGICAL HISTORY: Past Surgical History:  Procedure Laterality Date  . APPENDECTOMY  1992  . BREAST BIOPSY Right   . CHOLECYSTECTOMY  2000  . MASTECTOMY Right 03/13/2013  . MASTECTOMY MODIFIED RADICAL Right 03/13/2013   Procedure: MASTECTOMY MODIFIED RADICAL;  Surgeon: [name], MD;  Location: [place];  Service: General;  Laterality:  Right;  . PORT-A-CATH REMOVAL  03/13/2013  . PORT-A-CATH REMOVAL Left 03/13/2013   Procedure: REMOVAL PORT-A-CATH;  Surgeon: [name], MD;  Location: [place];  Service: General;  Laterality: Left;  . PORTACATH PLACEMENT  08/15/2012   Procedure: INSERTION PORT-A-CATH;  Surgeon: [name], MD;  Location: [place];  Service: General;  Laterality: Left;  . PORTACATH PLACEMENT N/A 01/09/2016   Procedure: INSERTION PORT-A-CATH;  Surgeon: [name], MD;  Location: [facility];  Service: General;  Laterality: N/A;  . TUBAL LIGATION  1979    FAMILY HISTORY Family History  Problem Relation Age of Onset  . Heart disease Mother    the patient's father died in his sleep at age 32. The patient's mother died at the age of 74 from a myocardial infarction. The patient has one brother and 2 sisters, all surviving. There is no history of breast or ovarian cancer in the family.  GYNECOLOGIC HISTORY:  (Reviewed 12/07/2013) Menarche age 20, first live birth age 72, she is GX P5, menopause age 38. She did not take hormone replacement.  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 Her husband [name] worked in the post office 78 years, and is also now retired. He is a former Company secretary. Son [name] lives in [place] and manages an [employer] store. Daughter [name] also lives in [place] and manages the IT Department at [employer]. Son [name] is a Engineer, materials. Daughter [name] is a Actuary for [employer]. Son [name] is an Actuary. The patient has 8 grandchildren. She attends [employer]   ADVANCED DIRECTIVES: Not in place  HEALTH MAINTENANCE:  (Updated 12/07/2013) Social History  Substance Use Topics  . Smoking status: Never Smoker  . Smokeless tobacco: Never Used  . Alcohol use No     Colonoscopy: Not on file  PAP: Not on file  Bone density:  January 2011   Lipid panel:  Not on file/  Dr. Shelia Media  No Known Allergies  Current Outpatient Prescriptions  Medication Sig Dispense Refill  . cloNIDine (CATAPRES-TTS-2) 0.2 mg/24hr patch Place 1 patch (0.2 mg total) onto the skin once a week. (Patient not taking: Reported on 07/15/2016) 4 patch 12  . fluconazole (DIFLUCAN) 100 MG tablet Take 1 tablet (100 mg total) by mouth daily. 10 tablet 0  . lidocaine-prilocaine (EMLA) cream Apply to affected area once (Patient not taking: Reported on 07/16/2016) 30 g 3  . lisinopril (PRINIVIL,ZESTRIL) 10 MG tablet TAKE 1 TABLET EVERY DAY 90 tablet 1  . LORazepam (ATIVAN) 0.5 MG tablet Take 1 tablet (0.5 mg total) by mouth at bedtime as needed (Nausea or vomiting). 30 tablet 0  . methadone (DOLOPHINE) 5 MG tablet Take 1 tablet (5 mg total) by mouth every 8 (eight) hours. For long acting pain relief 90 tablet 0  . metoprolol succinate (TOPROL-XL) 50 MG 24 hr tablet TAKE 1 TABLET BY MOUTH EVERY DAY WITH OR IMMEDIATELY FOLLOWING A MEAL 90 tablet 0  .  nystatin cream (MYCOSTATIN) Apply 1 application topically 2 (two) times daily as needed. (Patient taking differently: Apply 1 application topically 2 (two) times daily as needed (to red/irritated skin surround breast). ) 30 g 0  . oxyCODONE-acetaminophen (PERCOCET/ROXICET) 5-325 MG tablet Take 1-2 tablets by mouth every 6 (six) hours as needed for severe pain (for breakthrough pain). 120 tablet 0  . potassium chloride (K-DUR) 10 MEQ tablet Take 1 tablet (10 mEq total) by mouth daily. (Patient taking differently: Take 10 mEq by mouth daily as needed (for fatigue/sluggish). ) 60 tablet 5  . prochlorperazine (COMPAZINE) 10 MG tablet Take 1 tablet (10 mg total) by mouth every 6 (six) hours as needed (Nausea or vomiting). (Patient taking differently: Take 10 mg by mouth every 6 (six) hours as needed (Nausea or vomiting). For nausea/vomiting) 30 tablet 1  . triamterene-hydrochlorothiazide (MAXZIDE) 75-50 MG tablet Take 1 tablet by mouth daily. 90 tablet 6   No  current facility-administered medications for this visit.     OBJECTIVE: Middle-aged [place] American woman There were no vitals filed for this visit.   There is no height or weight on file to calculate BMI.    ECOG FS: 1 There were no vitals filed for this visit.  Sclerae unicteric, EOMs intact Oropharynx clear, slightly dry No cervical or supraclavicular adenopathy Lungs no rales or rhonchi Heart regular rate and rhythm Abd soft, nontender, positive bowel sounds MSK no focal spinal tenderness, no upper extremity lymphedema Neuro: nonfocal, well oriented, appropriate affect Breasts: The right breast is status post mastectomy and radiation. There is an "itchy area" over this section which is imaged below. There is a concern that this may represent local recurrence of her tumor. On the left side the breast remains firm, erythematous, and there is a nickel sized area of shallow erosion which is bleeding in the superior/anterior aspect of the breast. This was not imaged today.    Area of concern in the right chest wall 06/05/2016      Photo of left breast 04/20/2016      LAB RESULTS: CBC    Component Value Date/Time   WBC 9.4 07/24/2016 1252   RBC 3.38 (L) 07/24/2016 1252   HGB 11.5 (L) 07/24/2016 1252   HGB 11.1 (L) 07/15/2016 1105   HCT 34.0 (L) 07/24/2016 1252   HCT 34.3 (L) 07/15/2016 1105   PLT 246 07/24/2016 1252   PLT 225 07/15/2016 1105   MCV 100.6 (H) 07/24/2016 1252   MCV 104.3 (H) 07/15/2016 1105   MCH 34.0 07/24/2016 1252   MCHC 33.8 07/24/2016 1252   RDW 13.2 07/24/2016 1252   RDW 13.8 07/15/2016 1105   LYMPHSABS 0.7 07/24/2016 1252   LYMPHSABS 1.2 07/15/2016 1105   MONOABS 0.9 07/24/2016 1252   MONOABS 1.1 (H) 07/15/2016 1105   EOSABS 0.0 07/24/2016 1252   EOSABS 0.0 07/15/2016 1105   BASOSABS 0.0 07/24/2016 1252   BASOSABS 0.0 07/15/2016 1105    CMP     Component Value Date/Time   NA 133 (L) 07/22/2016 1413   NA 135 (L) 07/15/2016 1105   K  4.2 07/22/2016 1413   K 4.0 07/15/2016 1105   CL 98 (L) 07/22/2016 1413   CL 107 01/11/2013 0853   CO2 24 07/22/2016 1413   CO2 27 07/15/2016 1105   GLUCOSE 121 (H) 07/22/2016 1413   GLUCOSE 126 07/15/2016 1105   GLUCOSE 123 (H) 01/11/2013 0853   BUN 34 (H) 07/22/2016 1413   BUN 31.6 (H) 07/15/2016 1105  CREATININE 1.93 (H) 07/22/2016 1413   CREATININE 2.0 (H) 07/15/2016 1105   CALCIUM 9.5 07/22/2016 1413   CALCIUM 9.5 07/15/2016 1105   PROT 8.4 (H) 07/22/2016 1413   PROT 8.2 07/15/2016 1105   ALBUMIN 3.9 07/22/2016 1413   ALBUMIN 3.8 07/15/2016 1105   AST 19 07/22/2016 1413   AST 16 07/15/2016 1105   ALT 10 (L) 07/22/2016 1413   ALT 9 07/15/2016 1105   ALKPHOS 40 07/22/2016 1413   ALKPHOS 50 07/15/2016 1105   BILITOT 0.5 07/22/2016 1413   BILITOT 0.52 07/15/2016 1105   GFRNONAA 25 (L) 07/22/2016 1413   GFRAA 29 (L) 07/22/2016 1413       STUDIES: Chest 2 View  Result Date: 07/22/2016 CLINICAL DATA:  Preop for left mastectomy EXAM: CHEST  2 VIEW COMPARISON:  06/02/2016 FINDINGS: Cardiomediastinal silhouette is stable. No infiltrate or pleural effusion. No pulmonary edema. There is some pleural thickening or scarring left costophrenic angle. Right Port-A-Cath with tip in right atrium. No pneumothorax. Surgical clips are noted in right axilla. IMPRESSION: No infiltrate or pleural effusion. No pulmonary edema. There is some pleural thickening or scarring left costophrenic angle. Right Port-A-Cath with tip in right atrium. No pneumothorax. Surgical clips are noted in right axilla. Electronically Signed   By: Lahoma Crocker M.D.   On: 07/22/2016 14:48    ASSESSMENT: 70 y.o. [place] woman   (1)  s/p Right breast upper outer quadrant and axillary node biopsy 03/08/2012 for a clinical T4, N1-2', stage IIIB invasive ductal carcinoma, grade 3, triple negative, with an MIB-1 of 93%.  (2) definitive staging and treatment delayed as patient canceled staging studies and tried  alternative treatments; with eventual progression  (3) neoadjuvant chemotherapy started 08/23/12  (a) received two cycles of neoadjuvant cyclophosphamide, docetaxel, doxorubicin ["TAC"] at standard doses with neulasta support; refused neulasta with cycles 2 and 4; Powell treatment poorly  (b) docetaxel was held for final 4 cycles; completed cycle 6 neoadjuvant chemotherapy 12/27/2012    (4) status post right modified radical mastectomy 03/13/2013 showing no residual carcinoma in the breast, but nine of 9 axillary lymph nodes sampled involved with macro metastases (ypTX, ypN2, stage IIIA)-- repeat prognostic panel again triple negative  (5) radiation therapy completed 05/31/2013  (6) patient opted against reconstruction  METASTATIC DISEASE: (7) left axillary adenopathy noted on mammography June 2015, confirmed on CT scan November 2015  (a) patient refused further workup or treatment on multiple visits (see notes)  (8) chest CT 12/16/2015 shows progressive left axillary and subpectoral adenopathy, left upper extremity lymphedema, left breast soft tissue fullness and skin thickening  (a) left axillary lymph node biopsy 12/26/2015 confirms metastatic carcinoma, triple negative  (9) started gemcitabine/carboplatin 01/13/2016, repeated day 1 and day 8 of each 21 cycle   (a) Day 8 cycle 2 held because of low counts  (b) restaging CT scan of the chest 04/09/2016 after 6 cycles of chemotherapy shows evidence of mixed response  (c) treatments changed to every 2 weeks beginning with 04/20/2016 dose  (10) poorly controlled hypertension: Maxide added to lisinopril and metoprolol 04/13/2016. TTS-2 added November 2017  (11) left upper extremity lymphedema  PLAN:  I discussed the staging results with the patient and she understands that while some areas appear better, others appear worse. I think if we continue the current treatment we are not going to get her where she wishes to be, which is to have  relief of the left breast problem.  I am asking her surgeon Dr.  Dalbert Batman to tell us whether he thinks surgery is even possible at this time. He may also wish to consider a punch biopsy of the right chest wall erythematous skin area.  My goal however would be for the patient to enroll in [facility] so she can receive pembrolizumab, which I think she would tolerate well and may be able to control her tumor. She understands if she chooses to not participate or if she does not meet entry criteria we will have to consider other chemotherapy agents.  We discussed the mechanism of action of this agent and also the mechanism of his possible toxicities and complications. This was further discussed with the patient by the research nurse. Right now the patient does not meet entry criteria because her platelets are below, but this will resolve as she recovers from her more recent chemotherapy.  Controlling her blood pressure has been difficult. She tells me she is taking her blood pressure medications but that when she gets here she always gets very negatively excited and that brings up her blood pressure. She is going to have her blood pressure checked at her primary care physician's office and they will fax [place] a report. In the meantime I have urged her to go ahead and get the TTS 2 patch and start on it so that we can begin the process of enrolling her in this study.  Tentatively I have rescheduled her to see me 06/24/2016, but of course I will see her earlier than that if necessary she knows to call for any problems that may develop before the next visit.      Chauncey Cruel, MD    07/24/2016

## 2016-07-24 NOTE — ED Provider Notes (Signed)
[facility] DEPT Provider Note   CSN: [id number] Arrival date & time: 07/24/16  1218     History   Chief Complaint Chief Complaint  Patient presents with  . Chest Pain    HPI Janice Powell is a 70 y.o. female.  The history is provided by the patient. No language interpreter was used.  Chest Pain      Janice Powell is a 70 y.o. female who presents to the Emergency Department complaining of chest pain. She has a history of breast cancer and is currently undergoing treatment. Over the last week and a half she endorses central chest tightness with accompanying shortness of breath. Symptoms wax and wane with no clear alleviating or worsening factors. She denies any associated fevers, cough, abdominal pain. She has occasional vomiting over the last 2 weeks. No leg swelling or pain. She has no history of DVT or PE and she is not on any blood thinners. She does have a chronic rash to her chest wall.  Past Medical History:  Diagnosis Date  . Breast cancer ([place]) 03/08/13   right breast - Invasive Ductal Carcinoma   . GERD (gastroesophageal reflux disease)    occ  . Hx antineoplastic chemotherapy    two cycles of neoadjuvant cyclophosphamide, docetaxel, doxorubicin ["TAC"] at standard doses with neulasta support; refused neulasta with cycles 2 and 4; tolerated treatment poorly (b) docetaxel was held for final 4 cycles; completed cycle 6 neoadjuvant chemotherapy 12/27/2012    . Hypertension   . Osteopenia   . Pneumonia    hx  . 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 S/P radiation therapy    1) Right Chest Wall and IM nodes / 50 Gy in 25 fractions /2) Right Supraclavicular fossa / 50 Gy in 25 fractions/1) Right Chest Wall and IM nodes / 50 Gy in 25 fractions /2) Right Supraclavicular fossa / 50 Gy in 25 fractions/3) Right Posterior Axillary boost / 6.55 Gy in 25 fractions/4)  Right Chest Wall Scar boost / 10 Gy in 5 fractions      . Wears glasses     Patient Active Problem List   Diagnosis Date Noted  . Rash 07/17/2016  . Cellulitis 05/25/2016  . Recurrent cancer of right breast ([place]) 01/10/2016  . Lymphedema of arm 12/17/2015  . Breast mass, left 06/15/2015  . Atherosclerosis of aorta ([place]) 08/02/2014  . Breast cancer of upper-outer quadrant of right female breast ([place]) 03/31/2013  . Hypokalemia 12/13/2012  . Hypertension 07/04/2012    Past Surgical History:  Procedure Laterality Date  . APPENDECTOMY  1992  . BREAST BIOPSY Right   . CHOLECYSTECTOMY  2000  . MASTECTOMY Right 03/13/2013  . MASTECTOMY MODIFIED RADICAL Right 03/13/2013   Procedure: MASTECTOMY MODIFIED RADICAL;  Surgeon: [name], MD;  Location: [facility];  Service: General;  Laterality: Right;  . PORT-A-CATH REMOVAL  03/13/2013  . PORT-A-CATH REMOVAL Left 03/13/2013   Procedure: REMOVAL PORT-A-CATH;  Surgeon: [name], MD;  Location: [facility];  Service: General;  Laterality: Left;  . PORTACATH PLACEMENT  08/15/2012   Procedure: INSERTION PORT-A-CATH;  Surgeon: [name], MD;  Location: [facility];  Service: General;  Laterality: Left;  . PORTACATH PLACEMENT N/A 01/09/2016   Procedure: INSERTION PORT-A-CATH;  Surgeon: [name], MD;  Location: [facility];  Service: General;  Laterality: N/A;  . [place]  OB History    No data available       Home Medications    Prior to Admission medications   Medication Sig Start Date End Date Taking? Authorizing Provider  lisinopril (PRINIVIL,ZESTRIL) 10 MG tablet TAKE 1 TABLET EVERY DAY 01/19/16  Yes Chauncey Cruel, MD  metoprolol succinate (TOPROL-XL) 50 MG 24 hr tablet TAKE 1 TABLET BY MOUTH EVERY DAY WITH OR IMMEDIATELY FOLLOWING A MEAL 04/28/16  Yes Chauncey Cruel, MD  nystatin cream (MYCOSTATIN) Apply 1 application topically 2 (two) times daily as needed. Patient taking differently: Apply 1 application topically  daily as needed (to red/irritated skin surround breast).  07/15/16  Yes Susanne Borders, NP  oxyCODONE-acetaminophen (PERCOCET/ROXICET) 5-325 MG tablet Take 1-2 tablets by mouth every 6 (six) hours as needed for severe pain (for breakthrough pain). 07/17/16  Yes Chauncey Cruel, MD  potassium chloride (K-DUR) 10 MEQ tablet Take 1 tablet (10 mEq total) by mouth daily. Patient taking differently: Take 10 mEq by mouth daily as needed (for fatigue/sluggish).  06/07/15  Yes Chauncey Cruel, MD  triamterene-hydrochlorothiazide (MAXZIDE) 75-50 MG tablet Take 1 tablet by mouth daily. 04/13/16  Yes Chauncey Cruel, MD  cloNIDine (CATAPRES-TTS-2) 0.2 mg/24hr patch Place 1 patch (0.2 mg total) onto the skin once a week. Patient not taking: Reported on 07/24/2016 06/01/16   Chauncey Cruel, MD  fluconazole (DIFLUCAN) 100 MG tablet Take 1 tablet (100 mg total) by mouth daily. Patient not taking: Reported on 07/24/2016 07/15/16   Susanne Borders, NP  lidocaine-prilocaine (EMLA) cream Apply to affected area once Patient not taking: Reported on 07/24/2016 01/10/16   Chauncey Cruel, MD  LORazepam (ATIVAN) 0.5 MG tablet Take 1 tablet (0.5 mg total) by mouth at bedtime as needed (Nausea or vomiting). Patient not taking: Reported on 07/24/2016 01/10/16   Chauncey Cruel, MD  methadone (DOLOPHINE) 5 MG tablet Take 1 tablet (5 mg total) by mouth every 8 (eight) hours. For long acting pain relief Patient not taking: Reported on 07/24/2016 06/25/16   Chauncey Cruel, MD  prochlorperazine (COMPAZINE) 10 MG tablet Take 1 tablet (10 mg total) by mouth every 6 (six) hours as needed (Nausea or vomiting). Patient not taking: Reported on 07/24/2016 01/10/16   Chauncey Cruel, MD    Family History Family History  Problem Relation Age of Onset  . Heart disease Mother     Social History Social History  Substance Use Topics  . Smoking status: Never Smoker  . Smokeless tobacco: Never Used  . Alcohol use No      Allergies   Patient has no known allergies.   Review of Systems Review of Systems  Cardiovascular: Positive for chest pain.  All other systems reviewed and are negative.    Physical Exam Updated Vital Signs BP 150/67   Pulse 111   Temp 98.1 F (36.7 C) (Oral)   Resp 15   Ht 5' 3.5" (1.613 m)   Wt 131 lb (59.4 kg)   SpO2 97%   BMI 22.84 kg/m   Physical Exam  Constitutional: She is oriented to person, place, and time. She appears well-developed and well-nourished.  HENT:  Head: Normocephalic and atraumatic.  Cardiovascular: Regular rhythm.   No murmur heard. Tachycardic  Pulmonary/Chest: Effort normal and breath sounds normal. No respiratory distress.  Chest wall with right mastectomy that is healed. There is overlying erythema and mild induration with scaling. Left breast with significant induration and erythema with large ulcer over the  nipple with a small amount of local bleeding. There is no significant tenderness to the chest wall.  Abdominal: Soft. There is no tenderness. There is no rebound and no guarding.  Musculoskeletal: She exhibits no edema or tenderness.  Neurological: She is alert and oriented to person, place, and time.  Skin: Skin is warm and dry.  Psychiatric: She has a normal mood and affect. Her behavior is normal.  Nursing note and vitals reviewed.    ED Treatments / Results  Labs (all labs ordered are listed, but only abnormal results are displayed) Labs Reviewed  BASIC METABOLIC PANEL - Abnormal; Notable for the following:       Result Value   Sodium 130 (*)    Chloride 95 (*)    Glucose, Bld 116 (*)    BUN 41 (*)    Creatinine, Ser 1.87 (*)    GFR calc non Af Amer 26 (*)    GFR calc Af Amer 30 (*)    All other components within normal limits  CBC WITH DIFFERENTIAL/PLATELET - Abnormal; Notable for the following:    RBC 3.38 (*)    Hemoglobin 11.5 (*)    HCT 34.0 (*)    MCV 100.6 (*)    Neutro Abs 7.8 (*)    All other  components within normal limits  I-STAT TROPOININ, ED    EKG  EKG Interpretation  Date/Time:  Friday July 24 2016 12:28:34 EST Ventricular Rate:  100 PR Interval:    QRS Duration: 76 QT Interval:  303 QTC Calculation: 391 R Axis:   30 Text Interpretation:  Sinus tachycardia Low voltage, precordial leads Borderline repolarization abnormality Baseline wander in lead(s) III Confirmed by Hazle Coca [phone]) on 07/24/2016 2:56:32 PM       Radiology Dg Chest 2 View  Result Date: 07/24/2016 CLINICAL DATA:  Chest pain EXAM: CHEST  2 VIEW COMPARISON:  07/22/2016 FINDINGS: Right Port-A-Cath remains in place, unchanged. Increasing density at the left base could reflect atelectasis or developing infiltrate. Right lung is clear. Heart is normal size. No effusions. IMPRESSION: Increasing left basilar atelectasis or infiltrate. Electronically Signed   By: Rolm Baptise M.D.   On: 07/24/2016 13:15   Nm Pulmonary Perf And Vent  Result Date: 07/24/2016 CLINICAL DATA:  70 year old female with shortness of breath and chest tightness for 2 weeks. Breast cancer. Initial encounter. EXAM: NUCLEAR MEDICINE VENTILATION - PERFUSION LUNG SCAN TECHNIQUE: Ventilation images were obtained in multiple projections using inhaled aerosol Tc-61m DTPA. Perfusion images were obtained in multiple projections after intravenous injection of Tc-29m MAA. RADIOPHARMACEUTICALS:  31 mCi Technetium-35m DTPA aerosol inhalation and 4 mCi Technetium-27m MAA IV COMPARISON:  Chest radiographs 1305 hours today. Chest CT 06/02/2016 FINDINGS: Ventilation: Heterogeneous ventilation imaging, appears in part related to GI tract contamination. However, there is a moderate size ventilation defect at the left lung base. No other ventilation defect. Perfusion: Deficit at the left lung base, corresponding to the ventilation defect. No other perfusion defect. Chest radiographs today demonstrate a smaller area of abnormal opacity at the left lung  base. IMPRESSION: Intermediate probability for acute pulmonary embolus; there is a single triple matched left side lower lung zone defect. Electronically Signed   By: Genevie Ann M.D.   On: 07/24/2016 15:59    Procedures Procedures (including critical care time)  Medications Ordered in ED Medications  technetium TC 33M diethylenetriame-pentaacetic acid (DTPA) injection 31 millicurie (not administered)  technetium albumin aggregated (MAA) injection solution 4 millicurie (not administered)  enoxaparin (LOVENOX) injection  60 mg (not administered)  doxycycline (VIBRA-TABS) tablet 100 mg (100 mg Oral Given 07/24/16 1424)  sodium chloride 0.9 % bolus 500 mL (500 mLs Intravenous New Bag/Given 07/24/16 1620)     Initial Impression / Assessment and Plan / ED Course  I have reviewed the triage vital signs and the nursing notes.  Pertinent labs & imaging results that were available during my care of the patient were reviewed by me and considered in my medical decision making (see chart for details).  Clinical Course     Pt with hx/o breast cancer, currently undergoing treatment here with chest tightness and SOB.  Concern for possible PE but unable to obtain CT due to CKD.  She has mild tachcyardia in ED.  CXR w/ possible pna - will treat with doxycycline.  VQ scan indeterminant for PE, plan to treat for possible PE with lovenox and admit for further work up.    Final Clinical Impressions(s) / ED Diagnoses   Final diagnoses:  SOB (shortness of breath)    New Prescriptions New Prescriptions   No medications on file     Quintella Reichert, MD 07/24/16 1713

## 2016-07-24 NOTE — Progress Notes (Addendum)
PHARMACY NOTE:  ANTIMICROBIAL RENAL DOSAGE ADJUSTMENT  Current antimicrobial regimen includes a mismatch between antimicrobial dosage and estimated renal function.  As per policy approved by the Pharmacy & Therapeutics and Medical Executive Committees, the antimicrobial dosage will be adjusted accordingly.  Current antimicrobial dosage:  Ceftin 500 mg PO bid  Indication: r/o CAP  Renal Function: Estimated Creatinine Clearance: 24.2 mL/min (by C-G formula based on SCr of 1.87 mg/dL (H)). []      On intermittent HD, scheduled: []      On CRRT    Antimicrobial dosage has been changed to:  q24 hr dosing  Additional comments: Consider also holding ACE/diuretics for AKI   Thank you for allowing pharmacy to be a part of this patient's care.  Reuel Boom, PharmD, BCPS Pager: [phone] 07/24/2016, 10:06 PM

## 2016-07-24 NOTE — ED Notes (Signed)
Bed: WA01 Expected date:  Expected time:  Means of arrival:  Comments: 70 yo F weakness, anxiety

## 2016-07-24 NOTE — Progress Notes (Signed)
ANTICOAGULATION CONSULT NOTE - Initial Consult  Pharmacy Consult for Lovenox Indication: pulmonary embolus  No Known Allergies  Patient Measurements: Height: 5' 3.5" (161.3 cm) Weight: 131 lb (59.4 kg) IBW/kg (Calculated) : 53.55  Vital Signs: Temp: 98.1 F (36.7 C) (12/29 1229) Temp Source: Oral (12/29 1229) BP: 150/67 (12/29 1615) Pulse Rate: 111 (12/29 1615)  Labs:  Recent Labs  07/22/16 1413 07/24/16 1252  HGB 11.1* 11.5*  HCT 34.7* 34.0*  PLT 227 246  LABPROT 12.7  --   INR 0.96  --   CREATININE 1.93* 1.87*    Estimated Creatinine Clearance: 23.7 mL/min (by C-G formula based on SCr of 1.87 mg/dL (H)).   Medical History: Past Medical History:  Diagnosis Date  . Breast cancer ([place]) 03/08/13   right breast - Invasive Ductal Carcinoma   . GERD (gastroesophageal reflux disease)    occ  . Hx antineoplastic chemotherapy    two cycles of neoadjuvant cyclophosphamide, docetaxel, doxorubicin ["TAC"] at standard doses with neulasta support; refused neulasta with cycles 2 and 4; tolerated treatment poorly (b) docetaxel was held for final 4 cycles; completed cycle 6 neoadjuvant chemotherapy 12/27/2012    . Hypertension   . Osteopenia   . Pneumonia    hx  . 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 S/P radiation therapy    1) Right Chest Wall and IM nodes / 50 Gy in 25 fractions /2) Right Supraclavicular fossa / 50 Gy in 25 fractions/1) Right Chest Wall and IM nodes / 50 Gy in 25 fractions /2) Right Supraclavicular fossa / 50 Gy in 25 fractions/3) Right Posterior Axillary boost / 6.55 Gy in 25 fractions/4) Right Chest Wall Scar boost / 10 Gy in 5 fractions      . Wears glasses     Medications:  No anticoagulants PTA  Assessment: 70 yo F presents with chest pain.  She is currently being treated for Breast CA (completed chemo, plan for mastectomy on 07/28/16).  VQ scan  + PE.   CBC reviewed: Hg slightly low but stable.  Pltc wnl.  Chronic kidney disease with Scr 1.87 (estimated CrCl ~ 20-65ml/min).    Goal of Therapy:  AntiXa Level 4hrs after dose 0.6-1.0 Monitor platelets by anticoagulation protocol: Yes   Plan:  Lovenox 60mg  sq q24h Monitor CBC q3days F/U renal function  Netta Cedars, PharmD, BCPS Pager: [phone] 07/24/2016,4:35 PM

## 2016-07-24 NOTE — ED Notes (Signed)
Spoke with NM at this time; pt will be dosed for 1hr prior to study being performed.

## 2016-07-25 ENCOUNTER — Observation Stay (HOSPITAL_BASED_OUTPATIENT_CLINIC_OR_DEPARTMENT_OTHER): Payer: Medicare Other

## 2016-07-25 ENCOUNTER — Observation Stay (HOSPITAL_COMMUNITY): Payer: Medicare Other

## 2016-07-25 DIAGNOSIS — R609 Edema, unspecified: Secondary | ICD-10-CM

## 2016-07-25 DIAGNOSIS — E860 Dehydration: Secondary | ICD-10-CM

## 2016-07-25 DIAGNOSIS — N179 Acute kidney failure, unspecified: Secondary | ICD-10-CM | POA: Diagnosis not present

## 2016-07-25 DIAGNOSIS — I2699 Other pulmonary embolism without acute cor pulmonale: Secondary | ICD-10-CM | POA: Diagnosis not present

## 2016-07-25 DIAGNOSIS — J189 Pneumonia, unspecified organism: Secondary | ICD-10-CM | POA: Diagnosis not present

## 2016-07-25 DIAGNOSIS — J181 Lobar pneumonia, unspecified organism: Secondary | ICD-10-CM | POA: Diagnosis not present

## 2016-07-25 DIAGNOSIS — R0602 Shortness of breath: Secondary | ICD-10-CM

## 2016-07-25 LAB — BASIC METABOLIC PANEL
Anion gap: 10 (ref 5–15)
BUN: 38 mg/dL — AB (ref 6–20)
CO2: 24 mmol/L (ref 22–32)
CREATININE: 1.51 mg/dL — AB (ref 0.44–1.00)
Calcium: 8.6 mg/dL — ABNORMAL LOW (ref 8.9–10.3)
Chloride: 101 mmol/L (ref 101–111)
GFR calc Af Amer: 39 mL/min — ABNORMAL LOW (ref >60)
GFR, EST NON AFRICAN AMERICAN: 34 mL/min — AB (ref >60)
Glucose, Bld: 117 mg/dL — ABNORMAL HIGH (ref 65–99)
POTASSIUM: 3.5 mmol/L (ref 3.5–5.1)
SODIUM: 135 mmol/L (ref 135–145)

## 2016-07-25 LAB — HIV ANTIBODY (ROUTINE TESTING W REFLEX): HIV SCREEN 4TH GENERATION: NONREACTIVE

## 2016-07-25 LAB — TROPONIN I
Troponin I: 0.05 ng/mL (ref <0.03)
Troponin I: 0.05 ng/mL (ref <0.03)

## 2016-07-25 MED ORDER — ENOXAPARIN SODIUM 60 MG/0.6ML ~~LOC~~ SOLN
60.0000 mg | Freq: Two times a day (BID) | SUBCUTANEOUS | Status: DC
  Administered 2016-07-25: 11:00:00 60 mg via SUBCUTANEOUS
  Filled 2016-07-25: qty 0.6

## 2016-07-25 MED ORDER — CEFUROXIME AXETIL 500 MG PO TABS
500.0000 mg | ORAL_TABLET | Freq: Two times a day (BID) | ORAL | Status: DC
  Administered 2016-07-25: 500 mg via ORAL
  Filled 2016-07-25 (×2): qty 1

## 2016-07-25 MED ORDER — ONDANSETRON 4 MG PO TBDP: 4.0000 mg | ORAL_TABLET | Freq: Once | ORAL | Status: DC

## 2016-07-25 MED ORDER — CEFUROXIME AXETIL 500 MG PO TABS: 500.0000 mg | ORAL_TABLET | Freq: Every day | ORAL | 0 refills | Status: DC

## 2016-07-25 NOTE — Progress Notes (Signed)
VASCULAR LAB PRELIMINARY  PRELIMINARY  PRELIMINARY  PRELIMINARY  Left upper extremity venous duplex completed.    Preliminary report:  Left :  No evidence of DVT or superficial thrombosis.    Kathee Tumlin, RVS 07/25/2016, 9:11 AM

## 2016-07-25 NOTE — Progress Notes (Signed)
PHARMACY NOTE:  ANTIMICROBIAL RENAL DOSAGE ADJUSTMENT  Current antimicrobial regimen includes a mismatch between antimicrobial dosage and estimated renal function.  As per policy approved by the Pharmacy & Therapeutics and Medical Executive Committees, the antimicrobial dosage will be adjusted accordingly.  Current antimicrobial dosage:  Ceftin 500 mg PO q24h  Indication: r/o CAP  Renal Function: Estimated Creatinine Clearance: 29.9 mL/min (by C-G formula based on SCr of 1.51 mg/dL (H)). []      On intermittent HD, scheduled: []      On CRRT    Antimicrobial dosage has been changed to:  Ceftini 500 mg PO BID for CrCl >= 30 mL/min   Thank you for allowing pharmacy to be a part of this patient's care.  Clayburn Pert, PharmD, BCPS Pager: [phone] 07/25/2016  8:07 AM

## 2016-07-25 NOTE — Progress Notes (Signed)
SATURATION QUALIFICATIONS: (This note is used to comply with regulatory documentation for home oxygen)  Patient Saturations on Room Air at Rest = 98% on RA Patient Saturations on Room Air while Ambulating =98-99% on RA  Patient Saturations on  0 Liters of oxygen while Ambulating  O2 not needed  Please briefly explain why patient needs home oxygen:  Pt denies SOB during ambulation, walked 180 feet tolerated well.   SRP, RN

## 2016-07-25 NOTE — Progress Notes (Signed)
VASCULAR LAB PRELIMINARY  PRELIMINARY  PRELIMINARY  PRELIMINARY  Bilateral lower extremity venous duplex completed.    Preliminary report:  Bilateral:  No evidence of DVT, superficial thrombosis, or Baker's Cyst.   Sanari Offner, RVS 07/25/2016, 9:09 AM

## 2016-07-25 NOTE — Progress Notes (Signed)
Discharged to home, instructions given to patient acknowledge understanding. SRP, RN

## 2016-07-25 NOTE — Discharge Summary (Signed)
Discharge Summary  Janice Powell [id number] DOB: September 23, 1945  PCP: Horatio Pel, MD  Admit date: 07/24/2016 Discharge date: 07/25/2016  Time spent: 25 minutes   Recommendations for Outpatient Follow-up:  1. New medication: Ceftin 500 mg by mouth daily 4 days 2.  Patient will follow-up with her PCP in the next one month  Discharge Diagnoses:  Active Hospital Problems   Diagnosis Date Noted  . SOB (shortness of breath) 07/24/2016  . Dehydration 07/24/2016  . Lymphedema of arm 12/17/2015  . Breast mass, left 06/15/2015    Resolved Hospital Problems   Diagnosis Date Noted Date Resolved  No resolved problems to display.    Discharge Condition: Improved, being discharged home   Diet recommendation: Low-sodium   Vitals:   07/24/16 2114 07/25/16 0556  BP: (!) 155/66 (!) 143/61  Pulse: 100 100  Resp: 18 18  Temp: 98.6 F (37 C) 98.1 F (36.7 C)    History of present illness:  70 year old female past oral history of right sided breast cancer status post mastectomy and left sided breast cancer with plans for surgery next week who presented to the emergency room on 12/29 with an episode of chest tightness along with shortness of breath and dizziness. Chest x-ray was unequivocal noting atelectasis versus infiltrate and patient had normal white count with no fever, although she did have a neutrophil predominance. A VQ scan noted intermediate probability. Patient had no signs of hypoxia. She was noted to have acute kidney injury on admission She is brought in overnight for hydration and observation.  Hospital Course:  Principal Problem:   Community-acquired pneumonia causing SOB (shortness of breath): Following hydration, repeat chest x-ray noted some persistence of infiltrate. Patient was ambulated and able to maintain oxygen saturation on room air. She was started on Ceftin 500 mg daily (renally dosed) and discharged home on 4 more days for total of 5 days. Active  Problems:   Breast mass, left: For surgery next week   Lymphedema of arm: Also had upper extremity Doppler, no evidence of DVT  Acute kidney injury secondary to dehydration in the setting of stage III chronic kidney disease: On admission, patient's creatinine was at 1.84. ACE inhibitor and diuretics were held and she was gently rehydrated. By following day, creatinine at 1.5, her baseline and compared to previous labs in the last few months  Procedures:  12/30: Lower extremity Dopplers: No evidence of DVT   Consultations:  None  Discharge Exam: BP (!) 143/61 (BP Location: Right Arm)   Pulse 100   Temp 98.1 F (36.7 C) (Oral)   Resp 18   Ht 5\' 4"  (1.626 m)   Wt 61 kg (134 lb 7.7 oz)   SpO2 97%   BMI 23.08 kg/m   General: Alert and oriented 3  Cardiovascular: Regular rate and rhythm, S1-S2  Respiratory: Clear to auscultation bilaterally   Discharge Instructions You were cared for by a hospitalist during your hospital stay. If you have any questions about your discharge medications or the care you received while you were in the hospital after you are discharged, you can call the unit and asked to speak with the hospitalist on call if the hospitalist that took care of you is not available. Once you are discharged, your primary care physician will handle any further medical issues. Please note that NO REFILLS for any discharge medications will be authorized once you are discharged, as it is imperative that you return to your primary care physician (or establish a relationship  with a primary care physician if you do not have one) for your aftercare needs so that they can reassess your need for medications and monitor your lab values.  Discharge Instructions    Diet - low sodium heart healthy    Complete by:  As directed    Increase activity slowly    Complete by:  As directed      Allergies as of 07/25/2016   No Known Allergies     Medication List    TAKE these medications     cefUROXime 500 MG tablet Commonly known as:  CEFTIN Take 1 tablet (500 mg total) by mouth daily.   lisinopril 10 MG tablet Commonly known as:  PRINIVIL,ZESTRIL TAKE 1 TABLET EVERY DAY   metoprolol succinate 50 MG 24 hr tablet Commonly known as:  TOPROL-XL TAKE 1 TABLET BY MOUTH EVERY DAY WITH OR IMMEDIATELY FOLLOWING A MEAL   nystatin cream Commonly known as:  MYCOSTATIN Apply 1 application topically 2 (two) times daily as needed. What changed:  when to take this  reasons to take this   oxyCODONE-acetaminophen 5-325 MG tablet Commonly known as:  PERCOCET/ROXICET Take 1-2 tablets by mouth every 6 (six) hours as needed for severe pain (for breakthrough pain).   potassium chloride 10 MEQ tablet Commonly known as:  K-DUR Take 1 tablet (10 mEq total) by mouth daily. What changed:  when to take this  reasons to take this   triamterene-hydrochlorothiazide 75-50 MG tablet Commonly known as:  MAXZIDE Take 1 tablet by mouth daily.      No Known Allergies Follow-up Information    PHARR,WALTER DAVIDSON, MD Follow up in 1 month(s).   Specialty:  Internal Medicine Contact information: [address] [place] [address] [phone]            The results of significant diagnostics from this hospitalization (including imaging, microbiology, ancillary and laboratory) are listed below for reference.    Significant Diagnostic Studies: Dg Chest 2 View  Result Date: 07/25/2016 CLINICAL DATA:  70 year old female with shortness of breath. Breast cancer. Initial encounter. EXAM: CHEST  2 VIEW COMPARISON:  07/24/2016 and earlier.  V/Q scan 07/24/2016. FINDINGS: Patchy opacity at the left lung base appears further increased since 07/24/2016. Small associated left pleural effusion. No pneumothorax or pulmonary edema. The right lung remains clear. Stable cardiac size and mediastinal contours. Stable right chest porta cath. Stable visualized osseous structures.  Stable cholecystectomy clips. Negative visible bowel gas pattern. IMPRESSION: 1. Patchy opacity at the left lung base appears further increased since yesterday. Small associated left pleural effusion. This is nonspecific, especially in light of the indeterminate V/Q scan results yesterday. 2. Elsewhere the lungs remain clear. Electronically Signed   By: Genevie Ann M.D.   On: 07/25/2016 09:54   Dg Chest 2 View  Result Date: 07/24/2016 CLINICAL DATA:  Chest pain EXAM: CHEST  2 VIEW COMPARISON:  07/22/2016 FINDINGS: Right Port-A-Cath remains in place, unchanged. Increasing density at the left base could reflect atelectasis or developing infiltrate. Right lung is clear. Heart is normal size. No effusions. IMPRESSION: Increasing left basilar atelectasis or infiltrate. Electronically Signed   By: Rolm Baptise M.D.   On: 07/24/2016 13:15   Chest 2 View  Result Date: 07/22/2016 CLINICAL DATA:  Preop for left mastectomy EXAM: CHEST  2 VIEW COMPARISON:  06/02/2016 FINDINGS: Cardiomediastinal silhouette is stable. No infiltrate or pleural effusion. No pulmonary edema. There is some pleural thickening or scarring left costophrenic angle. Right Port-A-Cath with tip  in right atrium. No pneumothorax. Surgical clips are noted in right axilla. IMPRESSION: No infiltrate or pleural effusion. No pulmonary edema. There is some pleural thickening or scarring left costophrenic angle. Right Port-A-Cath with tip in right atrium. No pneumothorax. Surgical clips are noted in right axilla. Electronically Signed   By: Lahoma Crocker M.D.   On: 07/22/2016 14:48   Nm Pulmonary Perf And Vent  Result Date: 07/24/2016 CLINICAL DATA:  70 year old female with shortness of breath and chest tightness for 2 weeks. Breast cancer. Initial encounter. EXAM: NUCLEAR MEDICINE VENTILATION - PERFUSION LUNG SCAN TECHNIQUE: Ventilation images were obtained in multiple projections using inhaled aerosol Tc-5m DTPA. Perfusion images were obtained in multiple  projections after intravenous injection of Tc-31m MAA. RADIOPHARMACEUTICALS:  31 mCi Technetium-84m DTPA aerosol inhalation and 4 mCi Technetium-4m MAA IV COMPARISON:  Chest radiographs 1305 hours today. Chest CT 06/02/2016 FINDINGS: Ventilation: Heterogeneous ventilation imaging, appears in part related to GI tract contamination. However, there is a moderate size ventilation defect at the left lung base. No other ventilation defect. Perfusion: Deficit at the left lung base, corresponding to the ventilation defect. No other perfusion defect. Chest radiographs today demonstrate a smaller area of abnormal opacity at the left lung base. IMPRESSION: Intermediate probability for acute pulmonary embolus; there is a single triple matched left side lower lung zone defect. Electronically Signed   By: Genevie Ann M.D.   On: 07/24/2016 15:59    Microbiology: No results found for this or any previous visit (from the past 240 hour(s)).   Labs: Basic Metabolic Panel:  Recent Labs Lab 07/22/16 1413 07/24/16 1252 07/25/16 0106  NA 133* 130* 135  K 4.2 3.7 3.5  CL 98* 95* 101  CO2 24 23 24   GLUCOSE 121* 116* 117*  BUN 34* 41* 38*  CREATININE 1.93* 1.87* 1.51*  CALCIUM 9.5 9.2 8.6*   Liver Function Tests:  Recent Labs Lab 07/22/16 1413  AST 19  ALT 10*  ALKPHOS 40  BILITOT 0.5  PROT 8.4*  ALBUMIN 3.9   No results for input(s): LIPASE, AMYLASE in the last 168 hours. No results for input(s): AMMONIA in the last 168 hours. CBC:  Recent Labs Lab 07/22/16 1413 07/24/16 1252  WBC 6.8 9.4  NEUTROABS 4.1 7.8*  HGB 11.1* 11.5*  HCT 34.7* 34.0*  MCV 105.2* 100.6*  PLT 227 246   Cardiac Enzymes:  Recent Labs Lab 07/24/16 2156 07/25/16 0106 07/25/16 0715  TROPONINI 0.05* 0.05* 0.05*   BNP: BNP (last 3 results) No results for input(s): BNP in the last 8760 hours.  ProBNP (last 3 results) No results for input(s): PROBNP in the last 8760 hours.  CBG: No results for input(s): GLUCAP in  the last 168 hours.     Signed:  Annita Brod, MD [facility] 07/25/2016, 1:11 PM

## 2016-07-25 NOTE — Progress Notes (Signed)
[facility] for Lovenox Indication: pulmonary embolus  No Known Allergies  Patient Measurements: Height: 5\' 4"  (162.6 cm) Weight: 134 lb 7.7 oz (61 kg) IBW/kg (Calculated) : 54.7  Vital Signs: Temp: 98.1 F (36.7 C) (12/30 0556) Temp Source: Oral (12/30 0556) BP: 143/61 (12/30 0556) Pulse Rate: 100 (12/30 0556)  Labs:  Recent Labs  07/22/16 1413 07/24/16 1252 07/24/16 2156 07/25/16 0106  HGB 11.1* 11.5*  --   --   HCT 34.7* 34.0*  --   --   PLT 227 246  --   --   LABPROT 12.7  --   --   --   INR 0.96  --   --   --   CREATININE 1.93* 1.87*  --  1.51*  TROPONINI  --   --  0.05* 0.05*    Estimated Creatinine Clearance: 29.9 mL/min (by C-G formula based on SCr of 1.51 mg/dL (H)).   Medical History: Past Medical History:  Diagnosis Date  . Breast cancer ([place]) 03/08/13   right breast - Invasive Ductal Carcinoma   . GERD (gastroesophageal reflux disease)    occ  . Hx antineoplastic chemotherapy    two cycles of neoadjuvant cyclophosphamide, docetaxel, doxorubicin ["TAC"] at standard doses with neulasta support; refused neulasta with cycles 2 and 4; tolerated treatment poorly (b) docetaxel was held for final 4 cycles; completed cycle 6 neoadjuvant chemotherapy 12/27/2012    . Hypertension   . Osteopenia   . Pneumonia    hx  . Primary malignant neoplasm of left breast with stage 2 nodal metastasis per American Joint Committee on Cancer 7th edition (N2) ([place]) 01/09/2016  . S/P radiation therapy    1) Right Chest Wall and IM nodes / 50 Gy in 25 fractions /2) Right Supraclavicular fossa / 50 Gy in 25 fractions/1) Right Chest Wall and IM nodes / 50 Gy in 25 fractions /2) Right Supraclavicular fossa / 50 Gy in 25 fractions/3) Right Posterior Axillary boost / 6.55 Gy in 25 fractions/4) Right Chest Wall Scar boost / 10 Gy in 5 fractions      . Wears glasses     Medications:  No anticoagulants PTA  Assessment: 70 yo F presents with chest  pain.  She is currently being treated for Breast CA (completed chemo, plan for mastectomy on 07/28/16).  VQ scan + PE.   CBC reviewed: Hg slightly low but stable.  Pltc wnl.  Chronic kidney disease with Scr 1.87 (estimated CrCl ~ 20-59ml/min).    Goal of Therapy:  Full-dose LMWH, adjusted for renal function Monitor platelets by anticoagulation protocol: Yes   Today: Azotemia resolving; estimated CrCl 30 mL/min No bleeding reported in chart note    Plan:  Increase Lovenox to 60 mg (1mg /kg) SQ q12h Monitor CBC q3days Monitor for reports of bleeding F/U renal function closely and adjust LMWH dosage accordingly  Clayburn Pert, PharmD, BCPS Pager: [phone] 07/25/2016  8:11 AM

## 2016-07-26 ENCOUNTER — Other Ambulatory Visit: Payer: Self-pay | Admitting: Oncology

## 2016-07-26 MED ORDER — CEFAZOLIN SODIUM-DEXTROSE 2-4 GM/100ML-% IV SOLN
2.0000 g | INTRAVENOUS | Status: AC
  Administered 2016-07-28: 2 g via INTRAVENOUS
  Filled 2016-07-26: qty 100

## 2016-07-26 MED ORDER — GABAPENTIN 300 MG PO CAPS: 300.0000 mg | ORAL_CAPSULE | ORAL | Status: DC

## 2016-07-26 MED ORDER — CELECOXIB 200 MG PO CAPS: 400.0000 mg | ORAL_CAPSULE | ORAL | Status: DC

## 2016-07-26 MED ORDER — ACETAMINOPHEN 500 MG PO TABS
1000.0000 mg | ORAL_TABLET | ORAL | Status: AC
  Administered 2016-07-28: 1000 mg via ORAL
  Filled 2016-07-26: qty 2

## 2016-07-26 NOTE — H&P (Signed)
Janice Powell 07/09/2016 11:30 AM Location: [facility] Patient #: [id number] DOB: 02-Oct-1945 Married / Language: English / Race: Black or African American Female   History of Present Illness       The patient is a 70 year old female who presents with breast cancer. This is a 70 year old African American female. She is here with her husband today to discuss final planning and scheduling for management of her advanced left breast cancer. Dr. Jana Hakim is her oncologist. Dr. Deland Pretty is her primary care physician. Dr. Iran Planas is her plastic surgeon.      In 2011 she noticed a mass in the right breast but did not report this until 2013. When she presented she had locally advanced cancer of the right breast which on biopsy was a triple negative breast cancer. She declined chemotherapy initially but then reconsidered and a Port-A-Cath was placed and she received neoadjuvant chemotherapy with good response. On March 13, 2013 I performed a right modified radical mastectomy and removal of the Port-A-Cath and the final pathology showed complete pathologic response in the breast however 9 out of 9 lymph nodes were still positive for metastatic disease. She completed radiation therapy and physical therapy to the right shoulder. She has chronic lymphedema of the right arm which is not excessive and no recurrence on the right.      I saw her again in June 2017 with a locally advanced cancer of the left breast. Palpable and radiographically visible left axillary adenopathy was noted left axillary adenopathy had actually been noted on mammogram in June 2015 and confirmed on CT scan in November 2015 but the patient refused further workup despite multiple visits to Dr. Jana Hakim. Chest CT on Dec 16, 2015 showed a progressive left axillary and subpectoral adenopathy, left upper extremity lymphedema. Left axillary lymph node biopsy on December 26, 2015 confirmed metastatic carcinoma. She declined  PET/CT.       She consented to chemotherapy and I inserted a Port-A-Cath on the right side on January 09, 2016. She received neoadjuvant chemotherapy but did not respond well. She has chronic pain in the left breast. She has an ulcer that bleeds. Left arm lymphedema is worse than it is on the right. A CT scan of the chest performed on June 02, 2016 shows left supraclavicular lymph nodes enlarged. Reduced size of the left axillary lymph nodes. Interval increase in a small amount nodule in the inferior medial aspect left breast. Stable skin thickening. No pulmonary metastasis. The tumor does not appear to involve muscle or bone. There is no evidence of metastatic disease in abdomen or pelvis and no obvious skeletal metastasis.       Dr. Jana Hakim set her back to me to see if palliative mastectomy as feasible. I felt that the left modified radical mastectomy was feasible but the tumor was large and I could not get primary closure. She wanted to have something done because she did not want to live her life with a draining cancer in her left breast. This will be difficult. I sent her to Dr. Iran Planas who says that skin grafting and possible skin and muscle flap coverage can be considered.       I talked to the patient and her husband today. She knows this will be difficult. She knows the tumor is large and there will be a large defect. She could possibly have an open wound but the hope is that we will eventually get this completely closed. I told her that  there was a lot of risk with this including bleeding, infection, chronic wound management. I told her the swelling and pain in her lock left arm would not get any better. I told her I did not know whether the left pectoral pain would get better but hopefully it would improve some.  After a very, very long discussion we elected to proceed with scheduling of left modified radical mastectomy, working with Dr. Iran Planas to get coverage of the large  defect. This will be challenging.   Problem List/Past Medical  HISTORY OF RIGHT BREAST CANCER (Z85.3)  CANCER OF CENTRAL PORTION OF LEFT FEMALE BREAST (C50.112)  HISTORY OF MODIFIED RADICAL MASTECTOMY, RIGHT (Z90.11)  PRIMARY MALIGNANT NEOPLASM OF LEFT BREAST WITH METASTASIS TO MOVABLE IPSILATERAL LEVEL 1 OR 2 AXILLARY LYMPH NODES (N1) (C50.912)  HYPERTENSION, BENIGN (I10)   Past Surgical History Appendectomy  Gallbladder Surgery - Laparoscopic  Mastectomy  Right. Sentinel Lymph Node Biopsy   Diagnostic Studies History  Colonoscopy  never Mammogram  within last year Pap Smear  >5 years ago  Allergies  No Known Drug Allergies   Medication History  Lidocaine-Prilocaine (2.5-2.5% Cream, External) Active. LORazepam (0.5MG  Tablet, Oral) Active. Potassium Chloride (10MEQ Tablet ER, Oral) Active. Compazine (10MG  Tablet, Oral) Active. Maxzide (75-50MG  Tablet, Oral) Active. Lisinopril (10MG  Tablet, Oral) Active. Metoprolol Succinate ER (50MG  Tablet ER 24HR, Oral) Active. Hydrocodone-Acetaminophen (5-325MG  Tablet, Oral) Active. Medications Reconciled  Pregnancy / Birth History  Age at menarche  52 years. Age of menopause  61-55 Gravida  5 Maternal age  1-30 Para  1  Other Problems Breast Cancer  High blood pressure   Vitals  Weight: 136.13 lb Height: 63.5in Body Surface Area: 1.65 m Body Mass Index: 23.73 kg/m  Temp.: 98.28F(Oral)  Pulse: 83 (Regular)  BP: 132/84 (Sitting, Left Arm, Standard)    Physical Exam  General Mental Status-Alert. General Appearance-Consistent with stated age. Hydration-Well hydrated. Voice-Normal.  Head and Neck Head-normocephalic, atraumatic with no lesions or palpable masses. Trachea-midline. Thyroid Gland Characteristics - normal size and consistency.  Eye Eyeball - Bilateral-Extraocular movements intact. Sclera/Conjunctiva - Bilateral-No scleral icterus.  Chest and  Lung Exam Chest and lung exam reveals -quiet, even and easy respiratory effort with no use of accessory muscles and on auscultation, normal breath sounds, no adventitious sounds and normal vocal resonance. Inspection Chest Wall - Normal. Back - normal.  Breast Note: Left arm lymphedema greater than right but both swollen. Right mastectomy wound well healed without nodules or ulceration. Some patchy skin pigmentation. Skin is dry. Port-A-Cath palpable in right infraclavicular area. Left breast reveals a large central tumor mass with ulceration at the upper end of the areola and some bleeding. It does move around some and does not appear to be fixed to the chest wall that is quite large and does not move freely. Lots of skin changes. Possibly can get a clean resection margin but require plastic surgery coverage. Suspect skin defect will be at least 20 cm diameter. Some palpable adenopathy in the left axilla but not bulky. Old incision left infraclavicular area from old port.   Cardiovascular Cardiovascular examination reveals -normal heart sounds, regular rate and rhythm with no murmurs and normal pedal pulses bilaterally.  Abdomen Inspection  Inspection of the abdomen reveals: Note: Small umbilical hernia. Skin - Scar - no surgical scars. Palpation/Percussion Palpation and Percussion of the abdomen reveal - Soft, Non Tender, No Rebound tenderness, No Rigidity (guarding) and No hepatosplenomegaly. Auscultation Auscultation of the abdomen reveals - Bowel sounds normal.  Neurologic  Neurologic evaluation reveals -alert and oriented x 3 with no impairment of recent or remote memory. Mental Status-Normal.  Musculoskeletal Normal Exam - Left-Upper Extremity Strength Normal and Lower Extremity Strength Normal. Normal Exam - Right-Upper Extremity Strength Normal and Lower Extremity Strength Normal.  Lymphatic Head & Neck  General Head & Neck Lymphatics: Bilateral - Description -  Normal. Axillary  General Axillary Region: Bilateral - Description - Normal. Tenderness - Non Tender. Femoral & Inguinal  Generalized Femoral & Inguinal Lymphatics: Bilateral - Description - Normal. Tenderness - Non Tender. Note: There is no obvious supraclavicular mass.     Assessment & Plan  PRIMARY MALIGNANT NEOPLASM OF LEFT BREAST WITH METASTASIS TO MOVABLE IPSILATERAL LEVEL 1 OR 2 AXILLARY LYMPH NODES (N1) (C50.912)   You have an advanced left breast cancer which has ulcerated and is bleeding. You have cancer in the lymph nodes under the arm and that has caused permanent left arm swelling The pain in your fingers may be due to nerve damage from the cancer and that may be permanent also The recent CT scan shows that the cancer does not appear to be invading the muscle ribs, so theoretically it is movable surgery. If we wait much longer, this may not be the case and it may become un-resectable  you have stated that you would like to have the mastectomy and have Dr. Iran Planas do what she can to cover the large defect. I have explained to you that this will be a large defect and this will require skin grafting and possible muscle flaps. I discussed the indications, details, techniques, and numerous risk of the surgery. The surgery will probably stop the bleeding and the drainage. It is possible that we will improve the pain in her left chest wall but we'll have to wait and see. The swelling and pain in the arm will not get any better. There is no guarantee that the surgery will cure the cancer, but hopefully he will get clean healthy skin and it will improve the quality of your life.  HYPERTENSION, BENIGN (I10) HISTORY OF MODIFIED RADICAL MASTECTOMY, RIGHT (Z90.11) HISTORY OF RIGHT BREAST CANCER (Z85.3)    Edsel Petrin. Dalbert Batman, M.D., [facility] General and Minimally invasive Surgery Breast and Colorectal Surgery Office:   [phone] Pager:    [phone]

## 2016-07-28 ENCOUNTER — Ambulatory Visit (HOSPITAL_COMMUNITY): Payer: Medicare Other | Admitting: Vascular Surgery

## 2016-07-28 ENCOUNTER — Encounter (HOSPITAL_COMMUNITY)
Admission: RE | Disposition: A | Discharge status: Home Health | Payer: Self-pay | Source: Ambulatory Visit | Attending: General Surgery

## 2016-07-28 ENCOUNTER — Ambulatory Visit (HOSPITAL_COMMUNITY): Payer: Medicare Other | Admitting: Anesthesiology

## 2016-07-28 ENCOUNTER — Encounter (HOSPITAL_COMMUNITY): Payer: Self-pay | Admitting: Anesthesiology

## 2016-07-28 ENCOUNTER — Inpatient Hospital Stay (HOSPITAL_COMMUNITY)
Admission: RE | Admit: 2016-07-28 | Discharge: 2016-07-31 | DRG: 577 | Disposition: A | Discharge status: Home Health | Payer: Medicare Other | Source: Ambulatory Visit | Attending: General Surgery | Admitting: General Surgery

## 2016-07-28 DIAGNOSIS — Z79899 Other long term (current) drug therapy: Secondary | ICD-10-CM

## 2016-07-28 DIAGNOSIS — Z9221 Personal history of antineoplastic chemotherapy: Secondary | ICD-10-CM

## 2016-07-28 DIAGNOSIS — C773 Secondary and unspecified malignant neoplasm of axilla and upper limb lymph nodes: Secondary | ICD-10-CM | POA: Diagnosis present

## 2016-07-28 DIAGNOSIS — N644 Mastodynia: Secondary | ICD-10-CM | POA: Diagnosis not present

## 2016-07-28 DIAGNOSIS — R599 Enlarged lymph nodes, unspecified: Secondary | ICD-10-CM | POA: Diagnosis not present

## 2016-07-28 DIAGNOSIS — K219 Gastro-esophageal reflux disease without esophagitis: Secondary | ICD-10-CM | POA: Diagnosis not present

## 2016-07-28 DIAGNOSIS — R600 Localized edema: Secondary | ICD-10-CM

## 2016-07-28 DIAGNOSIS — Z79891 Long term (current) use of opiate analgesic: Secondary | ICD-10-CM | POA: Diagnosis not present

## 2016-07-28 DIAGNOSIS — M79652 Pain in left thigh: Secondary | ICD-10-CM

## 2016-07-28 DIAGNOSIS — I10 Essential (primary) hypertension: Secondary | ICD-10-CM | POA: Diagnosis present

## 2016-07-28 DIAGNOSIS — R0602 Shortness of breath: Secondary | ICD-10-CM | POA: Diagnosis not present

## 2016-07-28 DIAGNOSIS — G8929 Other chronic pain: Secondary | ICD-10-CM | POA: Diagnosis present

## 2016-07-28 DIAGNOSIS — Z853 Personal history of malignant neoplasm of breast: Secondary | ICD-10-CM | POA: Diagnosis not present

## 2016-07-28 DIAGNOSIS — Z923 Personal history of irradiation: Secondary | ICD-10-CM | POA: Diagnosis not present

## 2016-07-28 DIAGNOSIS — C50412 Malignant neoplasm of upper-outer quadrant of left female breast: Secondary | ICD-10-CM | POA: Diagnosis not present

## 2016-07-28 DIAGNOSIS — Z171 Estrogen receptor negative status [ER-]: Secondary | ICD-10-CM | POA: Diagnosis not present

## 2016-07-28 DIAGNOSIS — Z9012 Acquired absence of left breast and nipple: Secondary | ICD-10-CM

## 2016-07-28 DIAGNOSIS — C50112 Malignant neoplasm of central portion of left female breast: Secondary | ICD-10-CM | POA: Diagnosis not present

## 2016-07-28 DIAGNOSIS — C50912 Malignant neoplasm of unspecified site of left female breast: Secondary | ICD-10-CM | POA: Diagnosis present

## 2016-07-28 HISTORY — PX: APPLICATION OF A-CELL OF EXTREMITY: SHX6303

## 2016-07-28 HISTORY — PX: MASTECTOMY MODIFIED RADICAL: SHX5962

## 2016-07-28 HISTORY — PX: APPLICATION OF WOUND VAC: SHX5189

## 2016-07-28 HISTORY — PX: SKIN SPLIT GRAFT: SHX444

## 2016-07-28 SURGERY — MASTECTOMY, MODIFIED RADICAL
Anesthesia: General | Site: Leg Upper

## 2016-07-28 MED ORDER — FENTANYL CITRATE (PF) 100 MCG/2ML IJ SOLN
INTRAMUSCULAR | Status: AC
  Filled 2016-07-28: qty 2

## 2016-07-28 MED ORDER — POTASSIUM CHLORIDE CRYS ER 10 MEQ PO TBCR
10.0000 meq | EXTENDED_RELEASE_TABLET | Freq: Every day | ORAL | Status: DC
  Administered 2016-07-29 – 2016-07-30 (×2): 10 meq via ORAL
  Filled 2016-07-28 (×5): qty 1

## 2016-07-28 MED ORDER — HYDRALAZINE HCL 20 MG/ML IJ SOLN
INTRAMUSCULAR | Status: AC
  Administered 2016-07-28: 5 mg via INTRAVENOUS
  Filled 2016-07-28: qty 1

## 2016-07-28 MED ORDER — HYDROMORPHONE HCL 1 MG/ML IJ SOLN
INTRAMUSCULAR | Status: AC
  Administered 2016-07-28: 0.5 mg via INTRAVENOUS
  Filled 2016-07-28: qty 0.5

## 2016-07-28 MED ORDER — LISINOPRIL 10 MG PO TABS
10.0000 mg | ORAL_TABLET | Freq: Every day | ORAL | Status: DC
  Administered 2016-07-28 – 2016-07-30 (×3): 10 mg via ORAL
  Filled 2016-07-28 (×3): qty 1

## 2016-07-28 MED ORDER — METOPROLOL TARTRATE 5 MG/5ML IV SOLN
INTRAVENOUS | Status: DC | PRN
  Administered 2016-07-28 (×5): 1 mg via INTRAVENOUS

## 2016-07-28 MED ORDER — FENTANYL CITRATE (PF) 100 MCG/2ML IJ SOLN
INTRAMUSCULAR | Status: DC | PRN
  Administered 2016-07-28 (×2): 50 ug via INTRAVENOUS
  Administered 2016-07-28 (×2): 100 ug via INTRAVENOUS
  Administered 2016-07-28: 50 ug via INTRAVENOUS
  Administered 2016-07-28: 100 ug via INTRAVENOUS

## 2016-07-28 MED ORDER — ACETAMINOPHEN 650 MG RE SUPP: 650.0000 mg | Freq: Four times a day (QID) | RECTAL | Status: DC | PRN

## 2016-07-28 MED ORDER — OXYCODONE HCL 5 MG PO TABS: 5.0000 mg | ORAL_TABLET | Freq: Once | ORAL | Status: DC | PRN

## 2016-07-28 MED ORDER — METOPROLOL SUCCINATE ER 50 MG PO TB24
50.0000 mg | ORAL_TABLET | Freq: Every day | ORAL | Status: DC
  Administered 2016-07-29 – 2016-07-30 (×2): 50 mg via ORAL
  Filled 2016-07-28 (×2): qty 1

## 2016-07-28 MED ORDER — CHLORHEXIDINE GLUCONATE CLOTH 2 % EX PADS: 6.0000 | MEDICATED_PAD | Freq: Once | CUTANEOUS | Status: DC

## 2016-07-28 MED ORDER — ENOXAPARIN SODIUM 30 MG/0.3ML ~~LOC~~ SOLN
30.0000 mg | SUBCUTANEOUS | Status: DC
  Administered 2016-07-29 – 2016-07-30 (×2): 30 mg via SUBCUTANEOUS
  Filled 2016-07-28 (×2): qty 0.3

## 2016-07-28 MED ORDER — FENTANYL CITRATE (PF) 100 MCG/2ML IJ SOLN
INTRAMUSCULAR | Status: AC
  Administered 2016-07-28: 50 ug via INTRAVENOUS
  Filled 2016-07-28: qty 2

## 2016-07-28 MED ORDER — ACETAMINOPHEN 325 MG PO TABS
ORAL_TABLET | ORAL | Status: AC
  Administered 2016-07-28: 650 mg via ORAL
  Filled 2016-07-28: qty 2

## 2016-07-28 MED ORDER — OXYCODONE HCL 5 MG/5ML PO SOLN
5.0000 mg | Freq: Once | ORAL | Status: DC | PRN
Start: 2016-07-28 — End: 2016-07-28

## 2016-07-28 MED ORDER — ROCURONIUM BROMIDE 100 MG/10ML IV SOLN
INTRAVENOUS | Status: DC | PRN
  Administered 2016-07-28: 40 mg via INTRAVENOUS
  Administered 2016-07-28: 10 mg via INTRAVENOUS

## 2016-07-28 MED ORDER — POTASSIUM CHLORIDE IN NACL 20-0.9 MEQ/L-% IV SOLN
INTRAVENOUS | Status: DC
  Administered 2016-07-28: 22:00:00 via INTRAVENOUS
  Filled 2016-07-28: qty 1000

## 2016-07-28 MED ORDER — PROPOFOL 10 MG/ML IV BOLUS
INTRAVENOUS | Status: AC
  Filled 2016-07-28: qty 40

## 2016-07-28 MED ORDER — ONDANSETRON HCL 4 MG/2ML IJ SOLN
INTRAMUSCULAR | Status: DC | PRN
Start: 2016-07-28 — End: 2016-07-28
  Administered 2016-07-28 (×2): 4 mg via INTRAVENOUS

## 2016-07-28 MED ORDER — FENTANYL CITRATE (PF) 100 MCG/2ML IJ SOLN
25.0000 ug | INTRAMUSCULAR | Status: DC | PRN
  Administered 2016-07-28 (×2): 50 ug via INTRAVENOUS

## 2016-07-28 MED ORDER — HYDROMORPHONE HCL 2 MG/ML IJ SOLN
0.5000 mg | INTRAMUSCULAR | Status: DC | PRN
  Administered 2016-07-28 – 2016-07-31 (×6): 1 mg via INTRAVENOUS
  Filled 2016-07-28 (×8): qty 1

## 2016-07-28 MED ORDER — SUGAMMADEX SODIUM 200 MG/2ML IV SOLN
INTRAVENOUS | Status: DC | PRN
  Administered 2016-07-28: 130 mg via INTRAVENOUS

## 2016-07-28 MED ORDER — OXYCODONE HCL 5 MG PO TABS
ORAL_TABLET | ORAL | Status: AC
  Administered 2016-07-28: 10 mg via ORAL
  Filled 2016-07-28: qty 2

## 2016-07-28 MED ORDER — HYDROMORPHONE HCL 1 MG/ML IJ SOLN
INTRAMUSCULAR | Status: AC
  Administered 2016-07-28: 0.5 mg via INTRAVENOUS
  Filled 2016-07-28: qty 1

## 2016-07-28 MED ORDER — TRIAMTERENE-HCTZ 75-50 MG PO TABS
1.0000 | ORAL_TABLET | Freq: Every day | ORAL | Status: DC
  Administered 2016-07-28 – 2016-07-30 (×3): 1 via ORAL
  Filled 2016-07-28 (×4): qty 1

## 2016-07-28 MED ORDER — ONDANSETRON HCL 4 MG/2ML IJ SOLN: 4.0000 mg | Freq: Four times a day (QID) | INTRAMUSCULAR | Status: DC | PRN

## 2016-07-28 MED ORDER — DEXAMETHASONE SODIUM PHOSPHATE 10 MG/ML IJ SOLN
INTRAMUSCULAR | Status: DC | PRN
  Administered 2016-07-28: 10 mg via INTRAVENOUS

## 2016-07-28 MED ORDER — OXYCODONE HCL 5 MG PO TABS
5.0000 mg | ORAL_TABLET | ORAL | Status: DC | PRN
  Administered 2016-07-28 – 2016-07-29 (×2): 10 mg via ORAL
  Filled 2016-07-28: qty 2

## 2016-07-28 MED ORDER — LACTATED RINGERS IV SOLN
INTRAVENOUS | Status: DC
  Administered 2016-07-28 (×3): via INTRAVENOUS

## 2016-07-28 MED ORDER — POLYETHYLENE GLYCOL 3350 17 G PO PACK
17.0000 g | PACK | Freq: Every day | ORAL | Status: DC | PRN
  Filled 2016-07-28: qty 1

## 2016-07-28 MED ORDER — HYDROMORPHONE HCL 1 MG/ML IJ SOLN
0.5000 mg | INTRAMUSCULAR | Status: DC | PRN
  Administered 2016-07-28 (×3): 0.5 mg via INTRAVENOUS

## 2016-07-28 MED ORDER — MIDAZOLAM HCL 5 MG/5ML IJ SOLN
INTRAMUSCULAR | Status: DC | PRN
  Administered 2016-07-28: 1 mg via INTRAVENOUS

## 2016-07-28 MED ORDER — HYDRALAZINE HCL 20 MG/ML IJ SOLN
5.0000 mg | Freq: Once | INTRAMUSCULAR | Status: AC
  Administered 2016-07-28: 5 mg via INTRAVENOUS

## 2016-07-28 MED ORDER — ACETAMINOPHEN 325 MG PO TABS
650.0000 mg | ORAL_TABLET | Freq: Four times a day (QID) | ORAL | Status: DC | PRN
  Administered 2016-07-28: 650 mg via ORAL

## 2016-07-28 MED ORDER — MINERAL OIL LIGHT 100 % EX OIL
TOPICAL_OIL | CUTANEOUS | Status: AC
  Filled 2016-07-28: qty 25

## 2016-07-28 MED ORDER — MIDAZOLAM HCL 2 MG/2ML IJ SOLN
INTRAMUSCULAR | Status: AC
  Filled 2016-07-28: qty 2

## 2016-07-28 MED ORDER — 0.9 % SODIUM CHLORIDE (POUR BTL) OPTIME
TOPICAL | Status: DC | PRN
  Administered 2016-07-28: 2000 mL

## 2016-07-28 MED ORDER — ONDANSETRON HCL 4 MG/2ML IJ SOLN: 4.0000 mg | Freq: Once | INTRAMUSCULAR | Status: DC | PRN

## 2016-07-28 MED ORDER — ONDANSETRON 4 MG PO TBDP: 4.0000 mg | ORAL_TABLET | Freq: Four times a day (QID) | ORAL | Status: DC | PRN

## 2016-07-28 SURGICAL SUPPLY — 89 items
APL SKNCLS STERI-STRIP NONHPOA (GAUZE/BANDAGES/DRESSINGS) ×15
APPLIER CLIP 9.375 MED OPEN (MISCELLANEOUS) ×6
APR CLP MED 9.3 20 MLT OPN (MISCELLANEOUS) ×5
BANDAGE ACE 4X5 VEL STRL LF (GAUZE/BANDAGES/DRESSINGS) IMPLANT
BANDAGE ACE 6X5 VEL STRL LF (GAUZE/BANDAGES/DRESSINGS) IMPLANT
BANDAGE ELASTIC 6 VELCRO ST LF (GAUZE/BANDAGES/DRESSINGS) ×2 IMPLANT
BENZOIN TINCTURE PRP APPL 2/3 (GAUZE/BANDAGES/DRESSINGS) ×10 IMPLANT
BINDER BREAST LRG (GAUZE/BANDAGES/DRESSINGS) IMPLANT
BINDER BREAST XLRG (GAUZE/BANDAGES/DRESSINGS) IMPLANT
BLADE DERMATOME II (BLADE) ×6 IMPLANT
BLADE SURG 10 STRL SS (BLADE) ×6 IMPLANT
BNDG COHESIVE 4X5 TAN STRL (GAUZE/BANDAGES/DRESSINGS) IMPLANT
BNDG COHESIVE 6X5 TAN STRL LF (GAUZE/BANDAGES/DRESSINGS) ×4 IMPLANT
BNDG GAUZE ELAST 4 BULKY (GAUZE/BANDAGES/DRESSINGS) ×4 IMPLANT
CANISTER SUCTION 2500CC (MISCELLANEOUS) ×10 IMPLANT
CANISTER WOUND CARE 500ML ATS (WOUND CARE) ×2 IMPLANT
CLIP APPLIE 9.375 MED OPEN (MISCELLANEOUS) ×9 IMPLANT
COVER SURGICAL LIGHT HANDLE (MISCELLANEOUS) ×10 IMPLANT
DERMACARRIERS GRAFT 1 TO 1.5 (DISPOSABLE) ×12
DRAIN CHANNEL 15F RND FF W/TCR (WOUND CARE) ×8 IMPLANT
DRAIN CHANNEL 19F RND (DRAIN) ×4 IMPLANT
DRAPE LAPAROSCOPIC ABDOMINAL (DRAPES) ×6 IMPLANT
DRAPE ORTHO SPLIT 77X108 STRL (DRAPES) ×12
DRAPE PROXIMA HALF (DRAPES) ×12 IMPLANT
DRAPE SURG ORHT 6 SPLT 77X108 (DRAPES) ×10 IMPLANT
DRESSING TELFA 8X10 (GAUZE/BANDAGES/DRESSINGS) ×4 IMPLANT
DRSG ADAPTIC 3X8 NADH LF (GAUZE/BANDAGES/DRESSINGS) ×20 IMPLANT
DRSG PAD ABDOMINAL 8X10 ST (GAUZE/BANDAGES/DRESSINGS) ×20 IMPLANT
DRSG TELFA 3X8 NADH (GAUZE/BANDAGES/DRESSINGS) ×12 IMPLANT
DRSG VAC ATS LRG SENSATRAC (GAUZE/BANDAGES/DRESSINGS) ×2 IMPLANT
DRSG VAC ATS MED SENSATRAC (GAUZE/BANDAGES/DRESSINGS) IMPLANT
ELECT BLADE 4.0 EZ CLEAN MEGAD (MISCELLANEOUS) ×6
ELECT CAUTERY BLADE 6.4 (BLADE) ×10 IMPLANT
ELECT COATED BLADE 2.86 ST (ELECTRODE) ×6 IMPLANT
ELECT REM PT RETURN 9FT ADLT (ELECTROSURGICAL) ×6
ELECTRODE BLDE 4.0 EZ CLN MEGD (MISCELLANEOUS) ×5 IMPLANT
ELECTRODE REM PT RTRN 9FT ADLT (ELECTROSURGICAL) ×9 IMPLANT
EVACUATOR SILICONE 100CC (DRAIN) ×18 IMPLANT
GAUZE SPONGE 4X4 12PLY STRL (GAUZE/BANDAGES/DRESSINGS) ×16 IMPLANT
GEL ULTRASOUND 20GR AQUASONIC (MISCELLANEOUS) ×4 IMPLANT
GLOVE BIO SURGEON STRL SZ 6 (GLOVE) ×6 IMPLANT
GLOVE EUDERMIC 7 POWDERFREE (GLOVE) ×6 IMPLANT
GOWN STRL REUS W/ TWL LRG LVL3 (GOWN DISPOSABLE) ×18 IMPLANT
GOWN STRL REUS W/ TWL XL LVL3 (GOWN DISPOSABLE) ×5 IMPLANT
GOWN STRL REUS W/TWL LRG LVL3 (GOWN DISPOSABLE) ×12
GOWN STRL REUS W/TWL XL LVL3 (GOWN DISPOSABLE) ×6
GRAFT DERMACARRIERS 1 TO 1.5 (DISPOSABLE) ×6 IMPLANT
KIT BASIN OR (CUSTOM PROCEDURE TRAY) ×10 IMPLANT
KIT ROOM TURNOVER OR (KITS) ×10 IMPLANT
MATRIX SURGICAL PSM 10X15CM (Tissue) ×2 IMPLANT
MATRIX SURGICAL PSM 5X5CM (Tissue) ×2 IMPLANT
NS IRRIG 1000ML POUR BTL (IV SOLUTION) ×12 IMPLANT
PACK GENERAL/GYN (CUSTOM PROCEDURE TRAY) ×10 IMPLANT
PAD ABD 8X10 STRL (GAUZE/BANDAGES/DRESSINGS) ×4 IMPLANT
PAD ARMBOARD 7.5X6 YLW CONV (MISCELLANEOUS) ×16 IMPLANT
PAD DRESSING TELFA 3X8 NADH (GAUZE/BANDAGES/DRESSINGS) IMPLANT
PADDING CAST COTTON 6X4 STRL (CAST SUPPLIES) IMPLANT
PIN SAFETY STERILE (MISCELLANEOUS) ×8 IMPLANT
SPECIMEN JAR X LARGE (MISCELLANEOUS) ×6 IMPLANT
SPONGE LAP 18X18 X RAY DECT (DISPOSABLE) ×8 IMPLANT
STAPLER VISISTAT 35W (STAPLE) ×10 IMPLANT
STOCKINETTE IMPERVIOUS LG (DRAPES) ×4 IMPLANT
SUT CHROMIC 4 0 PS 2 18 (SUTURE) ×10 IMPLANT
SUT ETHILON 2 0 FS 18 (SUTURE) ×10 IMPLANT
SUT ETHILON 3 0 FSL (SUTURE) ×8 IMPLANT
SUT MNCRL AB 4-0 PS2 18 (SUTURE) ×4 IMPLANT
SUT MON AB 5-0 PS2 18 (SUTURE) IMPLANT
SUT PDS AB 0 CT 36 (SUTURE) IMPLANT
SUT PDS AB 2-0 CT1 27 (SUTURE) ×6 IMPLANT
SUT PDS AB 3-0 SH 27 (SUTURE) ×8 IMPLANT
SUT PLAIN 5 0 P 3 18 (SUTURE) IMPLANT
SUT SILK 2 0 FS (SUTURE) ×6 IMPLANT
SUT SILK 2 0 SH (SUTURE) ×6 IMPLANT
SUT VIC AB 3-0 PS2 18 (SUTURE) ×8 IMPLANT
SUT VIC AB 3-0 PS2 18XBRD (SUTURE) IMPLANT
SUT VIC AB 3-0 SH 18 (SUTURE) ×4 IMPLANT
SUT VIC AB 3-0 SH 27 (SUTURE)
SUT VIC AB 3-0 SH 27X BRD (SUTURE) IMPLANT
SUT VIC AB 4-0 PS2 27 (SUTURE) IMPLANT
SUT VIC AB 5-0 P-3 18XBRD (SUTURE) IMPLANT
SUT VIC AB 5-0 P3 18 (SUTURE)
SUT VLOC 180 0 24IN GS25 (SUTURE) IMPLANT
SYR BULB IRRIGATION 50ML (SYRINGE) ×6 IMPLANT
SYR CONTROL 10ML LL (SYRINGE) IMPLANT
TAPE CLOTH SURG 4X10 WHT LF (GAUZE/BANDAGES/DRESSINGS) IMPLANT
TOWEL OR 17X24 6PK STRL BLUE (TOWEL DISPOSABLE) ×10 IMPLANT
TOWEL OR 17X26 10 PK STRL BLUE (TOWEL DISPOSABLE) ×12 IMPLANT
TRAY FOLEY CATH 14FRSI W/METER (CATHETERS) ×6 IMPLANT
UNDERPAD 30X30 (UNDERPADS AND DIAPERS) ×6 IMPLANT

## 2016-07-28 NOTE — Op Note (Signed)
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 Dalbert Batman, M.D., FACS  Assistant:                      Irene Limbo, M.D.   Indication for Assistant: Complex surgery.  Extensive, locally advanced cancer requiring wide local resection, complex exposure.  Assistant indicated to optimize wound management and to reduce instance of intraoperative and postoperative complications  Operative Indications:  This is a 71 year old African American female. She is here with her husband today to discuss final planning and scheduling for management of her advanced LEFT breast cancer. Dr. Jana Hakim is her oncologist. Dr. Deland Pretty is her primary care physician. Dr. Iran Planas is her plastic surgeon.      In 2011 she noticed a mass in the RIGHT breast but did not report this until 2013. When she presented she had locally advanced cancer of the RIGHT breast which on biopsy was a triple negative breast cancer. She declined chemotherapy initially but then reconsidered and a Port-A-Cath was placed and she received neoadjuvant chemotherapy with good response. On March 13, 2013 I performed a right modified radical mastectomy and removal of the Port-A-Cath and the final pathology showed complete pathologic response in the breast however 9 out of 9 lymph nodes were still positive for metastatic disease. She completed radiation therapy and physical therapy to the right shoulder. She has chronic lymphedema of the right arm which is not excessive and no  documented  recurrence on the right.  However, the skin changes of her right breast, thought to be radiation changes, or progressive.      I saw her again in June 2016 with a locally advanced cancer of the LEFT breast. Palpable and radiographically visible left axillary adenopathy was noted left axillary adenopathy had actually been noted on mammogram in June 2015 and confirmed on CT scan in November 2015 but the patient refused further workup despite multiple visits to Dr. Jana Hakim. Chest CT on Dec 16, 2015 showed a progressive left axillary and subpectoral adenopathy, left upper extremity lymphedema. Left axillary lymph node biopsy on December 26, 2015 confirmed metastatic carcinoma. She declined PET/CT.       She consented to chemotherapy and I inserted a Port-A-Cath on the right side on January 09, 2016. She received neoadjuvant chemotherapy but did not respond well. She has chronic pain in the left breast. She has an  ulcer that bleeds. Left arm lymphedema is worse than it is on the right. A CT scan of the chest performed on June 02, 2016 shows left supraclavicular lymph nodes enlarged. Reduced size of the left axillary lymph nodes. Interval increase in a small amount nodule in the inferior medial aspect left breast. Stable skin thickening. No pulmonary metastasis. The tumor does not appear to involve muscle or bone, at least by CT.Marland Kitchen There is no evidence of metastatic disease in abdomen or pelvis and no obvious skeletal metastasis.       Dr. Jana Hakim set her back to me to see if palliative mastectomy as feasible. I felt that the left modified radical mastectomy was feasible but the tumor was large and I could not get primary closure. She wanted to have something done because she did not want to live her life with a draining cancer in her left breast. This will be difficult. I sent her to Dr. Iran Planas who says that skin grafting and possible skin and muscle flap coverage can be considered.        I talked to the patient and her husband . She knows this will be difficult. She knows the tumor is large and there will be a large defect. She could possibly have an open wound but the hope is that we will eventually get this completely closed. I told her that there was a lot of risk with this including bleeding, infection, chronic wound management, possible wound breakdown from recurrent cancer.. I told her the swelling and pain in her  LEFT arm would not get any better. I told her I did not know whether the left pectoral pain would get better but hopefully it would improve some.      After a very, very long discussion we elected to proceed with scheduling of LEFT modified radical mastectomy, working with Dr. Iran Planas to get coverage of the large defect. This will be challenging.  Operative Findings:       The tumor basically filled the left breast.  Inferiorly and laterally the skin was soft and did not appear to be involved by cancer.  Medially abscesses superiorly, the skin was thickened and I think there is significant risk that we will have positive margins medially and superiorly.  The tumor did seem to be involving the pectoralis major muscle, at least laterally.  The axilla was very dense and fibrotic and I chose not to perform an axillary dissection due to the concern that we would have major vascular or neurologic damage, and because I did not think it would affect survival or arm symptoms.  I therefore chose to perform a left total mastectomy leaving a large defect and Dr. Iran Planas plans to skin graft.  The defect was probably 25 cm x 25 cm.  Procedure in Detail:          Following the induction of general endotracheal anesthesia, a Foley catheter was placed.  Surgical timeout was performed.  Intravenous antibiotics were given.  The patient's left thigh, abdomen, chest, left axilla and upper arm were prepped and draped in a sterile fashion.      Using a marking pin I marked the limits of  resection.  As mentioned above laterally and inferiorly I felt the skin was soft and uninvolved, but medially and especially superiorly I felt that we were not going to be able to achieve a negative margin.  I made the incision with a knife and performed the dissection with electrocautery.  I  dissected the breast off of the pectoralis major and minor muscles. I  Identified the lateral border of the pectoralis major muscle.  I felt there was tumor involvement of the pectoralis major muscle infero-laterally.  The axilla was very firm and fibrotic and I  chose not to enter the axilla for reasons mentioned above.  Some bleeders were controlled with silk sutures but most of them were controlled with cautery.  The specimen was marked with a silk suture to mark the lateral skin margin and sent to pathology lab.  Hemostasis was very good.      At this point in the case I scrubbed out.  Dr. Iran Planas will dictate the wound closure procedure.  At this point estimate blood loss had been  about 150 mL.  Counts correct.  Complications none.     Edsel Petrin. Dalbert Batman, M.D., FACS General and Minimally Invasive Surgery Breast and Colorectal Surgery  07/28/2016 2:44 PM

## 2016-07-28 NOTE — Anesthesia Preprocedure Evaluation (Addendum)
Anesthesia Evaluation  Patient identified by MRN, date of birth, ID band Patient awake    Reviewed: Allergy & Precautions, NPO status , Patient's Chart, lab work & pertinent test results, reviewed documented beta blocker date and time   Airway Mallampati: II  TM Distance: >3 FB Neck ROM: Full    Dental  (+) Edentulous Upper, Teeth Intact, Dental Advisory Given   Pulmonary    breath sounds clear to auscultation       Cardiovascular hypertension, Pt. on medications and Pt. on home beta blockers  Rhythm:Regular Rate:Normal     Neuro/Psych    GI/Hepatic GERD  Medicated and Controlled,  Endo/Other    Renal/GU      Musculoskeletal   Abdominal   Peds  Hematology   Anesthesia Other Findings Lower teeth intact  Reproductive/Obstetrics                            Anesthesia Physical Anesthesia Plan  ASA: II  Anesthesia Plan: General   Post-op Pain Management:    Induction: Intravenous  Airway Management Planned: Oral ETT  Additional Equipment:   Intra-op Plan:   Post-operative Plan: Extubation in OR  Informed Consent:   Dental advisory given  Plan Discussed with: CRNA and Anesthesiologist  Anesthesia Plan Comments:         Anesthesia Quick Evaluation

## 2016-07-28 NOTE — Anesthesia Procedure Notes (Signed)
Procedure Name: Intubation Date/Time: 07/28/2016 1:29 PM Performed by: Williemae Area B Pre-anesthesia Checklist: Patient identified, Emergency Drugs available, Suction available and Patient being monitored Patient Re-evaluated:Patient Re-evaluated prior to inductionOxygen Delivery Method: Circle system utilized Preoxygenation: Pre-oxygenation with 100% oxygen Intubation Type: IV induction Ventilation: Mask ventilation without difficulty Laryngoscope Size: Mac and 3 Grade View: Grade II Tube type: Oral Tube size: 7.5 mm Number of attempts: 1 Airway Equipment and Method: Stylet Placement Confirmation: positive ETCO2,  breath sounds checked- equal and bilateral and ETT inserted through vocal cords under direct vision Secured at: 21 (cm at upper gum) cm Tube secured with: Tape Dental Injury: Teeth and Oropharynx as per pre-operative assessment

## 2016-07-28 NOTE — Anesthesia Postprocedure Evaluation (Signed)
Anesthesia Post Note  Patient: Janice Powell  Procedure(s) Performed: Procedure(s) (LRB): MASTECTOMY MODIFIED RADICAL (Left) SKIN GRAFT SPLIT THICKNESS from left thigh to left chest (N/A) APPLICATION OF WOUND VAC, left breast (Left) APPLICATION OF A-CELL OF TO LEFT THIGH (Left)  Patient location during evaluation: PACU Anesthesia Type: General Level of consciousness: awake, awake and alert and oriented Pain management: pain level controlled Vital Signs Assessment: post-procedure vital signs reviewed and stable Respiratory status: spontaneous breathing, nonlabored ventilation, respiratory function stable and patient connected to nasal cannula oxygen Cardiovascular status: blood pressure returned to baseline Anesthetic complications: no       Last Vitals:  Vitals:   07/28/16 1908 07/28/16 1913  BP: (!) 151/75 (!) 141/70  Pulse: 82 85  Resp: 11 10  Temp:  36.4 C    Last Pain:  Vitals:   07/28/16 1700  TempSrc:   PainSc: 8                  Destanie Tibbetts COKER

## 2016-07-28 NOTE — Transfer of Care (Signed)
Immediate Anesthesia Transfer of Care Note  Patient: Janice Powell  Procedure(s) Performed: Procedure(s): MASTECTOMY MODIFIED RADICAL (Left) SKIN GRAFT SPLIT THICKNESS from left thigh to left chest (N/A) APPLICATION OF WOUND VAC, left breast (Left) APPLICATION OF A-CELL OF TO LEFT THIGH (Left)  Patient Location: PACU  Anesthesia Type:General  Level of Consciousness: awake, alert  and patient cooperative  Airway & Oxygen Therapy: Patient Spontanous Breathing and Patient connected to nasal cannula oxygen  Post-op Assessment: Report given to RN and Post -op Vital signs reviewed and stable  Post vital signs: Reviewed and stable  Last Vitals:  Vitals:   07/28/16 1137 07/28/16 1645  BP: (!) 157/84 (!) 182/90  Pulse: 100 73  Resp: 16 14  Temp: 36.9 C     Last Pain:  Vitals:   07/28/16 1137  TempSrc: Oral         Complications: No apparent anesthesia complications

## 2016-07-28 NOTE — Interval H&P Note (Signed)
History and Physical Interval Note:  07/28/2016 11:44 AM  [place]  has presented today for surgery, with the diagnosis of LEFT BREAST CANCER  The various methods of treatment have been discussed with the patient and family. After consideration of risks, benefits and other options for treatment, the patient has consented to  Procedure(s): MASTECTOMY MODIFIED RADICAL (Left) SKIN GRAFT SPLIT THICKNESS from left thigh to left chest  POSSIBLE A CELL APPLICATION TO LEFT THIGH (N/A) APPLICATION OF WOUND VAC (N/A) possible adjacent fasciocutaneous flap transfer from abdomen to chest (N/A) as a surgical intervention .  The patient's history has been reviewed, patient examined, no change in status, stable for surgery.  I have reviewed the patient's chart and labs.  Questions were answered to the patient's satisfaction.     [name]

## 2016-07-28 NOTE — Op Note (Signed)
Operative Note   DATE OF OPERATION: 1.2.18  LOCATION: [facility]- inpatient  SURGICAL DIVISION: Plastic Surgery  PREOPERATIVE DIAGNOSES:  1. Left breast cancer 2. History right breast cancer  POSTOPERATIVE DIAGNOSES:  same  PROCEDURE:  1. Thoracoabdominal fasciocutaneous flap from left abdomen to left chest 2. Split thickness skin graft from left thigh to left chest 150 cm2 3. Application A Cell matrix to left thigh, total 175 cm2  SURGEON: Irene Limbo MD MBA  ASSISTANT: none  ANESTHESIA:  General.   EBL: 250 ml for entire case  COMPLICATIONS: None immediate.   INDICATIONS FOR PROCEDURE:  The patient, Janice Powell, is a 71 y.o. female born on August 06, 1945, is here for palliative mastectomy and wound coverage of left breast cancer ulcerated and replacing entirety breast.    FINDINGS: Open wound following left mastectomy 17 x 15 cm in greatest dimensions. Pectoralis indurated consisted with tumor invading muscle. Local flap fashioned to decrease size of wound and skin graft applied to remainder wound.   DESCRIPTION OF PROCEDURE:  The patient's operative site was marked with the patient in the preoperative area. The patient was taken to the operating room. Foley catheter placed. SCDs were placed and IV antibiotics were given. The patient's operative site was prepped and draped in a sterile fashion. A time out was performed and all information was confirmed to be correct. Please refer to Dr. Dalbert Batman operative note for details mastectomy. Skin and subcutaneous tissue elevated beneath superficial fascia caudally. Thoracoabdominal flap designed with caudal extent at level of umbilicus. Flap elevated in subfascial plane and dissected inferiorly and laterally, taking care to preserve perforators through external oblique. The flap was rotated superiorly and medially and inset with 2-0 PDSl in superficial fascia and 3-0 vicryl in dermis. 15 Fr drain placed beneath flap and secured to skin with  2-0 nylon. At this time remaining open wound 9 x in greatest dimensions. Split thickness skin graft obtained at 12/1000th of an inch and meshed in 1:1.5 ratio. This was inset over wound with 4-0 chromic. Total grafted area 150 cm2. Adaptic and VAC sponge applied as bolster over graft and set to 125 mm Hg continuous. The VAC sponge was also placed over flap incision. A Cell sheets totaling 175 cm2 applied to donor site and secured with interrupted 4-0 chromic. Donor site dressed with adaptic , ABD, and Kerlix, followed by Ace wrap.   The patient was allowed to wake from anesthesia, extubated and taken to the recovery room in satisfactory condition.   SPECIMENS: none  DRAINS: 15 Fr JP left chest, VAC  Irene Limbo, MD [facility] [phone], pin [phone]

## 2016-07-28 NOTE — Interval H&P Note (Signed)
History and Physical Interval Note:  07/28/2016 12:46 PM  [place]  has presented today for surgery, with the diagnosis of LEFT BREAST CANCER  The various methods of treatment have been discussed with the patient and family. After consideration of risks, benefits and other options for treatment, the patient has consented to  Procedure(s): MASTECTOMY MODIFIED RADICAL (Left) SKIN GRAFT SPLIT THICKNESS from left thigh to left chest  POSSIBLE A CELL APPLICATION TO LEFT THIGH (N/A) APPLICATION OF WOUND VAC (N/A) possible adjacent fasciocutaneous flap transfer from abdomen to chest (N/A) as a surgical intervention .  The patient's history has been reviewed, patient examined, no change in status, stable for surgery.  I have reviewed the patient's chart and labs.  Questions were answered to the patient's satisfaction.     Loni Abdon

## 2016-07-29 ENCOUNTER — Encounter (HOSPITAL_COMMUNITY): Payer: Self-pay | Admitting: *Deleted

## 2016-07-29 DIAGNOSIS — Z79899 Other long term (current) drug therapy: Secondary | ICD-10-CM | POA: Diagnosis not present

## 2016-07-29 DIAGNOSIS — C50112 Malignant neoplasm of central portion of left female breast: Secondary | ICD-10-CM | POA: Diagnosis present

## 2016-07-29 DIAGNOSIS — Z923 Personal history of irradiation: Secondary | ICD-10-CM | POA: Diagnosis not present

## 2016-07-29 DIAGNOSIS — N644 Mastodynia: Secondary | ICD-10-CM | POA: Diagnosis present

## 2016-07-29 DIAGNOSIS — Z171 Estrogen receptor negative status [ER-]: Secondary | ICD-10-CM | POA: Diagnosis not present

## 2016-07-29 DIAGNOSIS — R599 Enlarged lymph nodes, unspecified: Secondary | ICD-10-CM | POA: Diagnosis present

## 2016-07-29 DIAGNOSIS — I10 Essential (primary) hypertension: Secondary | ICD-10-CM | POA: Diagnosis present

## 2016-07-29 DIAGNOSIS — Z79891 Long term (current) use of opiate analgesic: Secondary | ICD-10-CM | POA: Diagnosis not present

## 2016-07-29 DIAGNOSIS — Z9221 Personal history of antineoplastic chemotherapy: Secondary | ICD-10-CM | POA: Diagnosis not present

## 2016-07-29 DIAGNOSIS — Z853 Personal history of malignant neoplasm of breast: Secondary | ICD-10-CM | POA: Diagnosis not present

## 2016-07-29 DIAGNOSIS — G8929 Other chronic pain: Secondary | ICD-10-CM | POA: Diagnosis present

## 2016-07-29 DIAGNOSIS — C773 Secondary and unspecified malignant neoplasm of axilla and upper limb lymph nodes: Secondary | ICD-10-CM | POA: Diagnosis present

## 2016-07-29 LAB — BASIC METABOLIC PANEL
Anion gap: 7 (ref 5–15)
BUN: 19 mg/dL (ref 6–20)
CHLORIDE: 104 mmol/L (ref 101–111)
CO2: 23 mmol/L (ref 22–32)
CREATININE: 1.2 mg/dL — AB (ref 0.44–1.00)
Calcium: 8.5 mg/dL — ABNORMAL LOW (ref 8.9–10.3)
GFR calc Af Amer: 52 mL/min — ABNORMAL LOW (ref >60)
GFR calc non Af Amer: 45 mL/min — ABNORMAL LOW (ref >60)
GLUCOSE: 132 mg/dL — AB (ref 65–99)
Potassium: 4.6 mmol/L (ref 3.5–5.1)
Sodium: 134 mmol/L — ABNORMAL LOW (ref 135–145)

## 2016-07-29 LAB — CBC
HEMATOCRIT: 28.9 % — AB (ref 36.0–46.0)
HEMOGLOBIN: 9.7 g/dL — AB (ref 12.0–15.0)
MCH: 34.2 pg — AB (ref 26.0–34.0)
MCHC: 33.6 g/dL (ref 30.0–36.0)
MCV: 101.8 fL — ABNORMAL HIGH (ref 78.0–100.0)
Platelets: 254 10*3/uL (ref 150–400)
RBC: 2.84 MIL/uL — ABNORMAL LOW (ref 3.87–5.11)
RDW: 13.3 % (ref 11.5–15.5)
WBC: 9.4 10*3/uL (ref 4.0–10.5)

## 2016-07-29 MED ORDER — DEXTROSE-NACL 5-0.45 % IV SOLN
INTRAVENOUS | Status: DC
  Administered 2016-07-29: 12:00:00 via INTRAVENOUS

## 2016-07-29 MED ORDER — ENSURE ENLIVE PO LIQD
237.0000 mL | Freq: Two times a day (BID) | ORAL | Status: DC
  Administered 2016-07-30: 237 mL via ORAL

## 2016-07-29 MED ORDER — BOOST / RESOURCE BREEZE PO LIQD
1.0000 | Freq: Three times a day (TID) | ORAL | Status: DC
  Administered 2016-07-29 – 2016-07-30 (×4): 1 via ORAL

## 2016-07-29 NOTE — Progress Notes (Signed)
   POD#1 left mastectomy, thoracoabdominal flap, STSG  Temp:  [97 F (36.1 C)-98.4 F (36.9 C)] 98.4 F (36.9 C) (01/03 0500) Pulse Rate:  [60-90] 90 (01/03 0951) Resp:  [10-17] 17 (01/03 0500) BP: (118-194)/(50-97) 118/52 (01/03 0951) SpO2:  [98 %-100 %] 100 % (01/03 0500) Weight:  [60.4 kg (133 lb 2.5 oz)] 60.4 kg (133 lb 2.5 oz) (01/02 1958)   Pain controlled Working with OT, walked with PT  JP 97.55ml   PE: VAC in place, left thigh dressing intact Significant LUE edema   A/P Arrange for [facility] and home VAC- plan VAC takedown at 1 week in clinic. No need for dressing changes until then if VAC seal holds, anticipate will need wound care following this and home health needs post VAC removal.  Patient desires to return home. Family teaching of VAC and drain. Counseled possible d/c tomorrow though patient feels she will need a day longer.  Encouraged to bring sleeve from home as her edema/lymphedema will be difficult to get under control. Elevate above heart.  Irene Limbo, MD [facility] Surgery [phone], pin [phone]

## 2016-07-29 NOTE — Care Management Note (Signed)
Case Management Note  Patient Details  Name: Janice Powell MRN: [id number] Date of Birth: 1946/04/05  Subjective/Objective:                    Action/Plan:  Faxed [facility] VAC application.  Expected Discharge Date:  08/01/16               Expected Discharge Plan:  [place]  In-House Referral:     Discharge planning Services  CM Consult  Post Acute Care Choice:  Durable Medical Equipment, Home Health Choice offered to:  Patient  DME Arranged:  Vac DME Agency:  [facility]  HH Arranged:  RN, PT [place] Agency:  [place]  Status of Service:  In process, will continue to follow  If discussed at Long Length of Stay Meetings, dates discussed:    Additional Comments:  Marilu Favre, RN 07/29/2016, 2:15 PM

## 2016-07-29 NOTE — Care Management Obs Status (Signed)
[place] NOTIFICATION   Patient Details  Name: ROI CIARCIA MRN: [id number] Date of Birth: [birth year]   Medicare Observation Status Notification Given:  Yes (OBS procedure)    Marilu Favre, RN 07/29/2016, 10:15 AM

## 2016-07-29 NOTE — Progress Notes (Signed)
Initial Nutrition Assessment  DOCUMENTATION CODES:   Not applicable  INTERVENTION:    Continue Ensure Enlive po BID, each supplement provides 350 kcal and 20 grams of protein  NUTRITION DIAGNOSIS:   Increased nutrient needs related to catabolic illness, wound healing as evidenced by estimated needs  GOAL:   Patient will meet greater than or equal to 90% of their needs  MONITOR:   PO intake, Supplement acceptance, Labs, Weight trends, I & O's  REASON FOR ASSESSMENT:   Malnutrition Screening Tool  ASSESSMENT:   71 y.o. female with breast cancer admitted for L mastectomy. PMH of R mastectomy.   Pt s/p palliative mastectomy and wound coverage of L breast 1/2. Reports her appetite "isn't very good" >> PO intake 10% per flowsheet records. States she typically eats well at dinner but not at breakfast and lunch. Does drink Ensure supplements at home >> orders in place. Pt has had an approximate 5% weight loss since August 2017 >> not significant for time frame  RD unable to complete Nutrition Focused Physical Exam at this time.  Diet Order:  Diet regular Room service appropriate? Yes; Fluid consistency: Thin  Skin:   wound VAC to L mastectomy   Last BM:  1/2  Height:   Ht Readings from Last 1 Encounters:  07/28/16 5' 3.5" (1.613 m)    Weight:   Wt Readings from Last 1 Encounters:  07/28/16 133 lb 2.5 oz (60.4 kg)   Wt Readings from Last 15 Encounters:  07/28/16 133 lb 2.5 oz (60.4 kg)  07/24/16 134 lb 7.7 oz (61 kg)  07/22/16 131 lb 14.4 oz (59.8 kg)  07/15/16 134 lb 4.8 oz (60.9 kg)  06/05/16 140 lb 4.8 oz (63.6 kg)  05/25/16 138 lb 14.4 oz (63 kg)  05/20/16 139 lb 1.6 oz (63.1 kg)  04/20/16 139 lb (63 kg)  04/13/16 141 lb 1.6 oz (64 kg)  03/16/16 141 lb (64 kg)  03/02/16 140 lb 8 oz (63.7 kg)  02/24/16 139 lb 12.8 oz (63.4 kg)  02/10/16 141 lb 6.4 oz (64.1 kg)  02/04/16 143 lb 3.2 oz (65 kg)  01/10/16 143 lb 14.4 oz (65.3 kg)    Ideal Body Weight:   52.2 kg  BMI:  Body mass index is 23.22 kg/m.  Estimated Nutritional Needs:   Kcal:  1800-2000  Protein:  90-100 gm  Fluid:  1.8-2.0 L  EDUCATION NEEDS:   No education needs identified at this time  Arthur Holms, RD, LDN Pager #: [phone] After-Hours Pager #: [phone]

## 2016-07-29 NOTE — Evaluation (Signed)
Occupational Therapy Evaluation Patient Details Name: Janice Powell MRN: [id number] DOB: 1945-10-21 Today's Date: 07/29/2016    History of Present Illness 71 y.o. female with breast cancer admitted for L mastectomy. PMH of R mastectomy.   Clinical Impression   Pt with decline in function and safety with ADLs, impaired L UE function with edema. Pt and family educated on L UE ROM, positioning in elevation, UB dressing techniques, ADL A/E for LB and   importance of using L UE as normal to tolerance. Pt would benefit from acute OT services to address impairments to increase level of function and safety    Follow Up Recommendations  No OT follow up;Supervision - Intermittent    Equipment Recommendations  Other (comment) (reacher, LH sponge)    Recommendations for Other Services       Precautions / Restrictions Precautions Precautions: Other (comment) (L chest and LE) Precaution Comments: skin graft donor site L thigh, L mastectomy with VAC Restrictions Weight Bearing Restrictions: No      Mobility Bed Mobility Overal bed mobility: Modified Independent             General bed mobility comments: pt up in recliner  Transfers Overall transfer level: Independent Equipment used: None                  Balance Overall balance assessment: Modified Independent;No apparent balance deficits (not formally assessed)                                          ADL Overall ADL's : Needs assistance/impaired     Grooming: Wash/dry hands;Wash/dry face;Supervision/safety;Standing   Upper Body Bathing: Minimal assistance   Lower Body Bathing: Moderate assistance Lower Body Bathing Details (indicate cue type and reason): increased pain to bend passed knees Upper Body Dressing : Minimal assistance   Lower Body Dressing: Moderate assistance Lower Body Dressing Details (indicate cue type and reason): increased pain to bend passed knees Toilet Transfer:  Modified Independent   Toileting- Clothing Manipulation and Hygiene: Minimal assistance;Min guard   Tub/ Shower Transfer: Modified independent   Functional mobility during ADLs: Modified independent General ADL Comments: Encouraged pt to use L UE as normal. Educated pt and family on use of reacher, LH sponge for LB ADLs and UB dressing techniques     Vision Vision Assessment?: No apparent visual deficits          Pertinent Vitals/Pain Pain Assessment: 0-10 Pain Score: 3  Pain Location: L thigh and mastectomy site Pain Descriptors / Indicators: Sore Pain Intervention(s): Monitored during session;Premedicated before session;Repositioned     Hand Dominance Left   Extremity/Trunk Assessment Upper Extremity Assessment Upper Extremity Assessment: Overall WFL for tasks assessed;Generalized weakness;LUE deficits/detail LUE Deficits / Details: edema throughtout LUE, decr grip strength +3/5, tingling L 4th and 5th fingers LUE Sensation: decreased light touch LUE Coordination: decreased fine motor   Lower Extremity Assessment Lower Extremity Assessment: LLE deficits/detail LLE Deficits / Details: L knee ext +3/5, edema throughout LLE   Cervical / Trunk Assessment Cervical / Trunk Assessment: Normal   Communication Communication Communication: No difficulties   Cognition Arousal/Alertness: Awake/alert Behavior During Therapy: WFL for tasks assessed/performed Overall Cognitive Status: Within Functional Limits for tasks assessed                     General Comments   ppt very pleasant and cooperative,  family very supporitve    Exercises   Other Exercises Other Exercises: educated pt and family on elevated position of L UE above heart fpr edema control with 2-3 pillows Other Exercises: educated pt and family on ROM of shoulder, elbow, wrist and hand of L UE to avoid joint stiffness with edema present Other Exercises: educated pt and family on L hand flexion/squeeze  exercises with hand in elevated position   Shoulder Instructions      Home Living Family/patient expects to be discharged to:: Private residence Living Arrangements: Spouse/significant other Available Help at Discharge: Family;Available 24 hours/day Type of Home: House Home Access: Level entry     Home Layout: Two level;Bed/bath upstairs Alternate Level Stairs-Number of Steps: 14 Alternate Level Stairs-Rails: Right Bathroom Shower/Tub: Tub/shower unit;Walk-in shower   Bathroom Toilet: Handicapped height     Home Equipment: None          Prior Functioning/Environment Level of Independence: Independent                 OT Problem List: Impaired UE functional use;Pain;Increased edema;Decreased coordination;Decreased activity tolerance;Decreased range of motion;Decreased strength   OT Treatment/Interventions: Self-care/ADL training;DME and/or AE instruction;Therapeutic activities;Therapeutic exercise    OT Goals(Current goals can be found in the care plan section) Acute Rehab OT Goals Patient Stated Goal: return to Bible study at her church OT Goal Formulation: With patient/family Time For Goal Achievement: 08/05/16 Potential to Achieve Goals: Good ADL Goals Pt Will Perform Upper Body Bathing: with supervision;with set-up;with caregiver independent in assisting Pt Will Perform Lower Body Bathing: with min assist;with caregiver independent in assisting;with adaptive equipment Pt Will Perform Upper Body Dressing: with set-up;with supervision;with caregiver independent in assisting Pt Will Perform Lower Body Dressing: with min assist;with caregiver independent in assisting;with adaptive equipment Pt Will Perform Toileting - Clothing Manipulation and hygiene: with supervision;with caregiver independent in assisting Additional ADL Goal #1: Pt will verbalize and demo proper positioning of L UE for edema mgt Additional ADL Goal #2: Pt will correctly demo L hand flexion  exercises and L UE ROM  OT Frequency: Min 2X/week   Barriers to D/C:    no barriers                     End of Session    Activity Tolerance: Patient tolerated treatment well Patient left: in chair;with call bell/phone within reach;with family/visitor present   Time: 1226-1259 OT Time Calculation (min): 33 min Charges:  OT General Charges $OT Visit: 1 Procedure OT Evaluation $OT Eval Moderate Complexity: 1 Procedure OT Treatments $Self Care/Home Management : 8-22 mins $Therapeutic Activity: 8-22 mins G-Codes: OT G-codes **NOT FOR INPATIENT CLASS** Functional Assessment Tool Used: clinical judgement Functional Limitation: Self care Self Care Current Status [id number]): At least 20 percent but less than 40 percent impaired, limited or restricted Self Care Goal Status [id number]): At least 20 percent but less than 40 percent impaired, limited or restricted  Britt Bottom 07/29/2016, 2:08 PM

## 2016-07-29 NOTE — Care Management Note (Signed)
Case Management Note  Patient Details  Name: Janice Powell MRN: [id number] Date of Birth: [birth year]  Subjective/Objective:                    Action/Plan:   Expected Discharge Date:  08/01/16               Expected Discharge Plan:  [place]  In-House Referral:     Discharge planning Services  CM Consult  Post Acute Care Choice:  Durable Medical Equipment, Home Health Choice offered to:  Patient  DME Arranged:  Vac DME Agency:  [facility]  HH Arranged:  RN [place] Agency:  [facility]  Status of Service:  In process, will continue to follow  If discussed at Long Length of Stay Meetings, dates discussed:    Additional Comments:  Marilu Favre, RN 07/29/2016, 10:42 AM

## 2016-07-29 NOTE — Evaluation (Addendum)
Physical Therapy Evaluation Patient Details Name: Janice Powell MRN: [id number] DOB: 1946-06-29 Today's Date: 07/29/2016   History of Present Illness  71 y.o. female with breast cancer admitted for L mastectomy. PMH of R mastectomy.  Clinical Impression  Pt admitted with above diagnosis. Pt currently with functional limitations due to the deficits listed below (see PT Problem List). Pt ambulated 60' with IV pole. Instructed pt in LE and UE exercises for edema control. Noted pt has tingling in L ulnar nerve distribution, likely due to edema.  Pt will benefit from skilled PT to increase their independence and safety with mobility to allow discharge to the venue listed below.       Follow Up Recommendations Home health PT    Equipment Recommendations  None recommended by PT    Recommendations for Other Services OT consult     Precautions / Restrictions Precautions Precautions: Other (comment) Precaution Comments: skin graft donor site L thigh, L mastectomy with VAC Restrictions Weight Bearing Restrictions: No      Mobility  Bed Mobility Overal bed mobility: Modified Independent             General bed mobility comments: log roll to R with bed flat, used rail to push up to sitting  Transfers Overall transfer level: Independent Equipment used: None                Ambulation/Gait Ambulation/Gait assistance: Supervision Ambulation Distance (Feet): 80 Feet Assistive device: 1 person hand held assist (pt held IV pole in RUE) Gait Pattern/deviations: Decreased stride length   Gait velocity interpretation: Below normal speed for age/gender General Gait Details: steady with IV pole, no LOB, distance limited by fatigue  Stairs            Wheelchair Mobility    Modified Rankin (Stroke Patients Only)       Balance Overall balance assessment: Modified Independent                                           Pertinent Vitals/Pain Pain  Assessment: 0-10 Pain Score: 5  Pain Location: L thigh and mastectomy site Pain Descriptors / Indicators: Sore Pain Intervention(s): Limited activity within patient's tolerance;Monitored during session;Premedicated before session;Repositioned    Home Living Family/patient expects to be discharged to:: Private residence Living Arrangements: Spouse/significant other Available Help at Discharge: Family;Available 24 hours/day Type of Home: House Home Access: Level entry     Home Layout: Two level;Bed/bath upstairs Home Equipment: None      Prior Function Level of Independence: Independent               Hand Dominance   Dominant Hand: Left    Extremity/Trunk Assessment   Upper Extremity Assessment Upper Extremity Assessment: LUE deficits/detail LUE Deficits / Details: edema throughtout LUE, decr grip strength +3/5, tingling L 4th and 5th fingers LUE Sensation: decreased light touch    Lower Extremity Assessment Lower Extremity Assessment: LLE deficits/detail LLE Deficits / Details: L knee ext +3/5, edema throughout LLE    Cervical / Trunk Assessment Cervical / Trunk Assessment: Normal  Communication   Communication: No difficulties  Cognition Arousal/Alertness: Awake/alert Behavior During Therapy: WFL for tasks assessed/performed Overall Cognitive Status: Within Functional Limits for tasks assessed                      General Comments  Exercises General Exercises - Lower Extremity Ankle Circles/Pumps: AROM;Both;10 reps;Seated Long Arc Quad: AROM;Both;5 reps;Seated Other Exercises Other Exercises: squeeze rolled towel with L hand x 10; flex/extend L elbow AROM x 5   Assessment/Plan    PT Assessment Patient needs continued PT services  PT Problem List Decreased strength;Decreased activity tolerance;Decreased mobility;Impaired sensation;Pain          PT Treatment Interventions Gait training;Stair training;Therapeutic activities;Therapeutic  exercise;Patient/family education    PT Goals (Current goals can be found in the Care Plan section)  Acute Rehab PT Goals Patient Stated Goal: return to Bible study at her church PT Goal Formulation: With patient Time For Goal Achievement: 08/12/16 Potential to Achieve Goals: Good    Frequency Min 3X/week   Barriers to discharge        Co-evaluation               End of Session Equipment Utilized During Treatment: Gait belt Activity Tolerance: Patient tolerated treatment well Patient left: in chair;with call bell/phone within reach Nurse Communication: Mobility status    Functional Assessment Tool Used: clinical judgement Functional Limitation: Mobility: Walking and moving around Mobility: Walking and Moving Around Current Status [phone]): At least 20 percent but less than 40 percent impaired, limited or restricted Mobility: Walking and Moving Around Goal Status [phone]): At least 1 percent but less than 20 percent impaired, limited or restricted    Time: 1034-1059 PT Time Calculation (min) (ACUTE ONLY): 25 min   Charges:   PT Evaluation $PT Eval Low Complexity: 1 Procedure PT Treatments $Gait Training: 8-22 mins   PT G Codes:   PT G-Codes **NOT FOR INPATIENT CLASS** Functional Assessment Tool Used: clinical judgement Functional Limitation: Mobility: Walking and moving around Mobility: Walking and Moving Around Current Status [id number]): At least 20 percent but less than 40 percent impaired, limited or restricted Mobility: Walking and Moving Around Goal Status [phone]): At least 1 percent but less than 20 percent impaired, limited or restricted    Janice Powell 07/29/2016, 11:37 AM [phone]

## 2016-07-29 NOTE — Progress Notes (Signed)
1 Day Post-Op  Subjective: Stable and alert.  Pleasant and friendly. States pain control is good. No nausea or vomiting Foley in place, to be removed this morning Hemoglobin 9.7.  WBC 9400 Pathology pending  Objective: Vital signs in last 24 hours: Temp:  [97 F (36.1 C)-98.5 F (36.9 C)] 98.4 F (36.9 C) (01/03 0500) Pulse Rate:  [60-100] 89 (01/03 0500) Resp:  [10-17] 17 (01/03 0500) BP: (119-194)/(50-97) 119/50 (01/03 0500) SpO2:  [97 %-100 %] 100 % (01/03 0500) Weight:  [60.4 kg (133 lb 2.5 oz)] 60.4 kg (133 lb 2.5 oz) (01/02 1958)    Intake/Output from previous day: 01/02 0701 - 01/03 0700 In: 2600 [P.O.:340; I.V.:2260] Out: 1427.5 [Urine:1030; Drains:147.5; Blood:250] Intake/Output this shift: No intake/output data recorded.  General appearance: Alert.  Oriented.  Mental status normal.  No obvious distress Resp: clear to auscultation bilaterally Chest wall: no tenderness, Left mastectomy wound inspected.  Abdominal rotation flap viable.  No hematoma.  Negative pressure dressing on superior wound over skin graft.  Clean.  Skin edges healthy. Extremities: Significant lymphedema left arm.  Lesser lymphedema right arm.  Negative pressure dressing left thigh.  Appears to be holding good charred skin edges viable.  Lab Results:  Results for orders placed or performed during the hospital encounter of 07/28/16 (from the past 24 hour(s))  CBC     Status: Abnormal   Collection Time: 07/29/16  5:15 AM  Result Value Ref Range   WBC 9.4 4.0 - 10.5 K/uL   RBC 2.84 (L) 3.87 - 5.11 MIL/uL   Hemoglobin 9.7 (L) 12.0 - 15.0 g/dL   HCT 28.9 (L) 36.0 - 46.0 %   MCV 101.8 (H) 78.0 - 100.0 fL   MCH 34.2 (H) 26.0 - 34.0 pg   MCHC 33.6 30.0 - 36.0 g/dL   RDW 13.3 11.5 - 15.5 %   Platelets 254 150 - 400 K/uL     Studies/Results: No results found.  . enoxaparin (LOVENOX) injection  30 mg Subcutaneous Q24H  . feeding supplement  1 Container Oral TID BM  . feeding supplement (ENSURE  ENLIVE)  237 mL Oral BID BM  . lisinopril  10 mg Oral Daily  . metoprolol succinate  50 mg Oral Daily  . potassium chloride  10 mEq Oral Daily  . triamterene-hydrochlorothiazide  1 tablet Oral Daily     Assessment/Plan: s/p Procedure(s): MASTECTOMY MODIFIED RADICAL SKIN GRAFT SPLIT THICKNESS from left thigh to left chest APPLICATION OF WOUND VAC, left breast APPLICATION OF A-CELL OF TO LEFT THIGH  POD #1.  Palliative left mastectomy, split thickness skin graft from left thigh to left chest, application wound VAC.  Application a cell to left thigh Stable DC Foley Up to chair if okay with Dr. Iran Planas PT and OT consults requested Await pathology.  Anticipate positive margins superiorly and possibly medially.  Deep margin obviously positive.  History right modified radical mastectomy for advanced cancer Hypertension, benign-lisinopril, Toprol-XL, Maxide.--- Currently normotensive with heart rate 89. Recent hospitalization for dehydration, elevated creatinine.  NSAIDs implicated.--- Check b-met  DVT prophylaxis.  Lovenox ordered.   _0 @  LOS: 0 days    Darnette Lampron M 07/29/2016  . .prob

## 2016-07-30 MED ORDER — SENNOSIDES-DOCUSATE SODIUM 8.6-50 MG PO TABS: 1.0000 | ORAL_TABLET | Freq: Every evening | ORAL | Status: DC | PRN

## 2016-07-30 MED ORDER — HYDROMORPHONE HCL 2 MG PO TABS
1.0000 mg | ORAL_TABLET | ORAL | Status: DC | PRN
  Administered 2016-07-30 (×4): 2 mg via ORAL
  Filled 2016-07-30 (×4): qty 1

## 2016-07-30 NOTE — Progress Notes (Signed)
2 Days Post-Op  Subjective: Stable and alert. Request oral Dilaudid for pain Lower output from JP. Tolerating diet but no stools.  Will increase MiraLAX Pathology pending  Seen by PT and OT Slow to mobilize.  Not well motivated. I  requested for her to have her husband bring her left arm sleeve for her lymphedema again. Planning for discharge tomorrow with home health nursing, vac , possible home health PT  Objective: Vital signs in last 24 hours: Temp:  [98.2 F (36.8 C)-98.7 F (37.1 C)] 98.2 F (36.8 C) (01/04 0500) Pulse Rate:  [83-90] 84 (01/04 0500) Resp:  [18-19] 19 (01/04 0500) BP: (103-137)/(40-62) 134/62 (01/04 0500) SpO2:  [100 %] 100 % (01/04 0500) Last BM Date: 07/29/16  Intake/Output from previous day: 01/03 0701 - 01/04 0700 In: 1400 [P.O.:800; I.V.:600] Out: 1070 [Urine:1000; Drains:70] Intake/Output this shift: No intake/output data recorded.  General appearance: Alert.  No apparent distress.  Mental status normal although very depressed affect. Chest wall: no tenderness, VAC in place left chest.  No significant bleeding.  Skin edges viable.  Significant left upper extremity edema. Extremities: Left thigh dressing intact, clean, no bleeding or swelling  Lab Results:  No results found for this or any previous visit (from the past 24 hour(s)).   Studies/Results: No results found.  . enoxaparin (LOVENOX) injection  30 mg Subcutaneous Q24H  . feeding supplement  1 Container Oral TID BM  . feeding supplement (ENSURE ENLIVE)  237 mL Oral BID BM  . lisinopril  10 mg Oral Daily  . metoprolol succinate  50 mg Oral Daily  . potassium chloride  10 mEq Oral Daily  . triamterene-hydrochlorothiazide  1 tablet Oral Daily     Assessment/Plan:  Procedure(s): MASTECTOMY MODIFIED RADICAL SKIN GRAFT SPLIT THICKNESS from left thigh to left chest APPLICATION OF WOUND VAC, left breast APPLICATION OF A-CELL OF TO LEFT THIGH   POD #2.  Palliative left mastectomy,  split thickness skin graft from left thigh to left chest, application wound VAC.  Application a cell to left thigh Stable Up to chair... Needs lots of encouragement appreciated.PT and OT consults  Await pathology.  Anticipate positive margins superiorly and possibly medially.  Deep margin obviously positive.  agree with Dr. Para Skeans plans for discharge tomorrow with home health nursing.    History right modified radical mastectomy for advanced cancer Hypertension, benign-lisinopril, Toprol-XL, Maxide.--- Currently normotensive with heart rate 89. Recent hospitalization for dehydration, elevated creatinine.  NSAIDs implicated.--- Check b-met  DVT prophylaxis.  Lovenox ordered.  @PROBHOSP @  LOS: 1 day    Yehudit Fulginiti M 07/30/2016  . .prob

## 2016-07-30 NOTE — Progress Notes (Signed)
Patient refused miralax

## 2016-07-30 NOTE — Progress Notes (Signed)
Occupational Therapy Treatment Patient Details Name: Janice Powell MRN: [id number] DOB: Jul 26, 1946 Today's Date: 07/30/2016    History of present illness 71 y.o. female with breast cancer admitted for L mastectomy. PMH of R mastectomy.   OT comments  Pt making progress with functional goals, however pt required encouragement to get OOB for activity. OT will continue to follow acutely  Follow Up Recommendations  No OT follow up;Supervision - Intermittent    Equipment Recommendations  Other (comment) (reacher, LH sponge)    Recommendations for Other Services      Precautions / Restrictions Precautions Precaution Comments: skin graft donor site L thigh, L mastectomy with VAC Restrictions Weight Bearing Restrictions: Yes LUE Weight Bearing: Non weight bearing       Mobility Bed Mobility Overal bed mobility: Needs Assistance Bed Mobility: Supine to Sit     Supine to sit: Min assist     General bed mobility comments: min A to elevate trunk to sit EOB, pt not very motivated  Transfers Overall transfer level: Independent Equipment used: None             General transfer comment: pt required encouragement to get OOB to recliner    Balance Overall balance assessment: Modified Independent;No apparent balance deficits (not formally assessed)                                 ADL Overall ADL's : Needs assistance/impaired                                       General ADL Comments: Pt repiorts that nurse tech provided set up/min A with bathing this morning seated in recliner                                      Cognition   Behavior During Therapy: [facility] for tasks assessed/performed Overall Cognitive Status: Within Functional Limits for tasks assessed                       Extremity/Trunk Assessment   L UE impaired, edema            Exercises Other Exercises Other Exercises: Pt participated in ROM of L UE  assisted with R UE 3 sets 10 reps shoulder flexion to 90 degrees, shoulder elevation, elbow felxion/extension, hand and wrist flexion and extension Other Exercises: L hand flexion exercises with blue squeeze ball Other Exercises: readjusted L UE compression sleeve that pt's husband brough in form home and positioned L UE in elevation with 2 pillow seated in recliner          General Comments  pt pleasant and cooperative    Pertinent Vitals/ Pain       Pain Assessment: 0-10 Pain Score: 4  Pain Location: L UE and thigh Pain Descriptors / Indicators: Sore Pain Intervention(s): Monitored during session;Repositioned                                                          Frequency  Min 2X/week  Progress Toward Goals  OT Goals(current goals can now be found in the care plan section)  Progress towards OT goals: Progressing toward goals     Plan Discharge plan remains appropriate                     End of Session     Activity Tolerance Patient limited by fatigue   Patient Left in chair;with call bell/phone within reach;with family/visitor present   Nurse Communication          Time: [id number] OT Time Calculation (min): 18 min  Charges: OT General Charges $OT Visit: 1 Procedure OT Treatments $Therapeutic Activity: 8-22 mins  Britt Bottom 07/30/2016, 1:43 PM

## 2016-07-30 NOTE — Progress Notes (Signed)
   POD#2 left mastectomy, thoracoabdominal flap, STSG  Temp:  [98.2 F (36.8 C)-98.7 F (37.1 C)] 98.2 F (36.8 C) (01/04 0500) Pulse Rate:  [83-90] 84 (01/04 0500) Resp:  [18-19] 19 (01/04 0500) BP: (103-137)/(40-62) 134/62 (01/04 0500) SpO2:  [100 %] 100 % (01/04 0500)   HH and VAC for home arranged  JP 20 Has concerns about getting to [facility] with VAC Also reports dilaudid more effective with pain control   PE: VAC in place, left thigh dressing intact Significant LUE edema   A/P Will switch to oral dilaudid- reviewed constipation with narcotics, will give Miralax today  Plan VAC takedown at 1 week in clinic. No need for dressing changes until then if VAC seal holds, anticipate will need wound care following this and home health needs post VAC removal.  Keep thigh dressing dry intact until follow up, reinforce as needed. Sponge bathe until follow up.  Compression garment to LUE- patient has at home and asked family to bring; it may not fit or may need to be remeasured for this as outpt given increased edema. Elevate above heart.  Patient desires to remain in hospital today. I have written d/c orders in case.  Irene Limbo, MD [facility] Plastic & Reconstructive Surgery [phone], pin [phone]

## 2016-07-31 ENCOUNTER — Encounter: Payer: Self-pay | Admitting: Plastic Surgery

## 2016-07-31 MED ORDER — HYDROMORPHONE HCL 4 MG PO TABS: 2.0000 mg | ORAL_TABLET | Freq: Four times a day (QID) | ORAL | 0 refills | Status: DC | PRN

## 2016-07-31 NOTE — Progress Notes (Signed)
   Patient seen while waiting for discharge. Her VAC was alarming and was not turned on. I instructed her on this, what to do if alarms in future.  She expressed concern her husband does not want her to have pain medicine more than every 6 hours- discussed with husband to recommend taking pain medication before her post op visit with me next week as will have to remove all dressings.  Irene Limbo, MD [facility] Plastic & Reconstructive Surgery [phone], pin [phone]

## 2016-07-31 NOTE — Discharge Summary (Addendum)
Patient ID: Janice Powell [id number] y.o. 1945-10-21  Admit date: 07/28/2016  Discharge date and time: 08/01/2015  Admitting Physician: [name]  Discharge Physician: [name]  Admission Diagnoses: LEFT BREAST CANCER  Discharge Diagnoses: Locally advanced cancer left breast, triple negative breast cancer, stage T4, N2                                         Status post neoadjuvant chemotherapy with disease progression during chemotherapy                                         History right modified radical mastectomy for cancer right breast                                         Chronic lymphedema left arm                                         Chronic lymphedema right arm  Operations: Procedure(s): MASTECTOMY MODIFIED RADICAL SKIN GRAFT SPLIT THICKNESS from left thigh to left chest APPLICATION OF WOUND VAC, left breast APPLICATION OF A-CELL OF TO LEFT THIGH  Admission Condition: fair  Discharged Condition: fair  Indication for Admission: This is a 71 year old African American female with a locally advanced triple negative cancer of the left breast. Dr. Jana Hakim is her oncologist. Dr. Deland Pretty is her primary care physician. Dr. Iran Planas is her plastic surgeon. In 2011 she noticed a mass in the RIGHT breast but did not report this until 2013. When she presented she had locally advanced cancer of the RIGHT breast which on biopsy was a triple negative breast cancer. She declined chemotherapy initially but then reconsidered and a Port-A-Cath was placed and she received neoadjuvant chemotherapy with good response. On March 13, 2013 I performed a right modified radical mastectomy and removal of the Port-A-Cath and the final pathology showed complete pathologic response in the breast however 9 out of 9 lymph nodes were still positive for metastatic disease. She completed radiation therapy and physical therapy to the right shoulder. She has chronic  lymphedema of the right arm which is not excessive and no documented  recurrence on the right.  However, the skin changes of her right breast, thought to be radiation changes, are progressive. Isaw her again in June 2016 with a locally advanced cancer of the LEFT breast. Palpable and radiographically visible left axillary adenopathy was noted left axillary adenopathy had actually been noted on mammogram in June 2015 and confirmed on CT scan in November 2015 but the patient refused further workup despite multiple visits to Dr. Jana Hakim. Chest CT on Dec 16, 2015 showed a progressive left axillary and subpectoral adenopathy, left upper extremity lymphedema. Left axillary lymph node biopsy on December 26, 2015 confirmed metastatic carcinoma. She declined PET/CT. She eventually consented to chemotherapy and I inserted a Port-A-Cath on the right side on January 09, 2016. She received neoadjuvant chemotherapy but did not respond. She has chronic pain in the left breast. She has an ulcer that bleeds. Left arm lymphedema is worse than it is on the right.  A CT scan of the chest performed on June 02, 2016 shows left supraclavicular lymph nodes enlarged. Reduced size of the left axillary lymph nodes. Interval increase in a small amount nodule in the inferior medial aspect left breast. Stable skin thickening. No pulmonary metastasis. The tumor does not appear to involve muscle or bone, at least by CT.Marland Kitchen There is no evidence of metastatic disease in abdomen or pelvis and no obvious skeletal metastasis. Dr. Jana Hakim set her back to me to see if palliative mastectomy as feasible. I felt that the left modified radical mastectomy was feasible but the tumor was large and I could not get primary closure. She wanted to have something done because she did not want to live her life with a draining cancer in her left breast. This will be difficult. I sent her to Dr. Iran Planas who says that skin grafting  and possible skin and muscle flap coverage can be considered. I talked to the patient and her husband . She knows this will be difficult. She knows the tumor is large and there will be a large defect. She could possibly have an open wound but the hope is that we will eventually get this completely closed. I told her that there was a lot of risk with this including bleeding, infection, chronic wound management, possible wound breakdown from recurrent cancer.. I told her the swelling and pain in her  LEFT arm would not get any better. I told her I did not know whether the left pectoral pain would get better but hopefully it would improve some.      After a very, very long discussion we elected to proceed with scheduling of LEFT modified radical mastectomy, working with Dr. Iran Planas to get coverage of the large defect. This will be challenging.  Hospital Course: On the day of admission the patient was taken to the operating room.  She underwent left total mastectomy.  There was obvious involvement of cancer at some of the skin margins an obvious cancer involvement of the deep margins.  We did not feel that we could get a negative margin and so chose not to do any axillary surgery.  Dr. Iran Planas performed a thoracoabdominal fasciocutaneous flap and skin grafting from left thigh to left chest and application of A- cell matrix to left thigh.        The tumor basically filled the left breast.  Inferiorly and laterally the skin was soft and did not appear to be involved by cancer.  Medially abscesses superiorly, the skin was thickened and I think there is significant risk that we will have positive margins medially and superiorly.  The tumor did seem to be involving the pectoralis major muscle, at least laterally.  The axilla was very dense and fibrotic and I chose not to perform an axillary dissection due to the concern that we would have major vascular or neurologic damage, and because I did not think  it would affect survival or arm symptoms.  I therefore chose to perform a left total mastectomy leaving a large defect and Dr. Iran Planas plans to skin graft.  The defect was probably 25 cm x 25 cm.        Postoperatively she had no complications but was quite deconditioned.  She had no problems with the negative pressure dressing.  We involve physical therapy and occupational therapy.  Physical therapy felt that she needed home health PT.  There were no occupational therapy needs.  Case management was involved and arrange for  home health PT and for visiting nurses to help with the negative pressure dressings and ultimate dressing changes.        She was able to void uneventfully after the Foley was removed.  She tolerated her diet.  She had a bowel movement the day before surgery.  On the day of discharge she said that she wanted to go home.  Pain control hadn't initially been a little bit difficult but on oral Dilaudid pain control is good.  We discharged her on oral Dilaudid in her usual medications.         Examination on the day of discharge revealed the left thoracoabdominal flap to be viable and soft.  Negative pressure dressing on the remaining open wound and skin graft looked clean and the skin margins were healthy.  Left thigh wound was clean.  Lungs were clear.  Abdomen was soft and nontender.          She will return to see Dr. Iran Planas on January 10.  I advised her to make an appointment see Dr. Aleen Campi and to see me in approximately one month.           The final pathology shows malignant tumor to feel the breast with a broadly positive deep margin.  This was expected preop and intraoperative.  I discussed the pathology report with her and told her that she still had cancer remaining and the only options we had worked consideration of chemotherapy and/or radiation therapy.  She is aware that radiation therapy will likely result in wound breakdown.  She is advised to continue to wear her left  lymphedema sleeves although she does not like to do that.  She is aware that this is a difficult management problem.  I told her we will continue to help her with all aspects of her care.  Consults: None  Significant Diagnostic Studies: pathology  Treatments: surgery: See detailed description above  Disposition: Home  Patient Instructions:  Allergies as of 07/31/2016   No Known Allergies     Medication List    STOP taking these medications   nystatin cream Commonly known as:  MYCOSTATIN   oxyCODONE-acetaminophen 5-325 MG tablet Commonly known as:  PERCOCET/ROXICET     TAKE these medications   cefUROXime 500 MG tablet Commonly known as:  CEFTIN Take 1 tablet (500 mg total) by mouth daily.   HYDROmorphone 4 MG tablet Commonly known as:  DILAUDID Take 0.5-1 tablets (2-4 mg total) by mouth every 6 (six) hours as needed for severe pain.   lisinopril 10 MG tablet Commonly known as:  PRINIVIL,ZESTRIL TAKE 1 TABLET EVERY DAY   metoprolol succinate 50 MG 24 hr tablet Commonly known as:  TOPROL-XL TAKE 1 TABLET BY MOUTH EVERY DAY WITH OR IMMEDIATELY FOLLOWING A MEAL   potassium chloride 10 MEQ tablet Commonly known as:  K-DUR Take 1 tablet (10 mEq total) by mouth daily. What changed:  when to take this  reasons to take this   triamterene-hydrochlorothiazide 75-50 MG tablet Commonly known as:  MAXZIDE Take 1 tablet by mouth daily.       Activity: activity as tolerated Diet: regular diet Wound Care: as directed  Follow-up:  With Dr. Iran Planas in 5 days                       With Dr. Dalbert Batman in one month  With Dr. Randell Patient not in one month.  Signed: Edsel Petrin. Dalbert Batman, M.D., FACS General and minimally invasive surgery Breast and Colorectal Surgery  07/31/2016, 6:38 AM

## 2016-07-31 NOTE — Discharge Instructions (Signed)
-  see above 

## 2016-07-31 NOTE — Progress Notes (Signed)
IV removed. JP drain removed per order. Patient switched over to her home wound vac. Prescriptions given. Discharge paperwork reviewed with patient and paitent's husband. Patient is ready for discharge.

## 2016-08-01 DIAGNOSIS — C50912 Malignant neoplasm of unspecified site of left female breast: Secondary | ICD-10-CM | POA: Diagnosis not present

## 2016-08-01 DIAGNOSIS — I10 Essential (primary) hypertension: Secondary | ICD-10-CM | POA: Diagnosis not present

## 2016-08-01 DIAGNOSIS — Z9013 Acquired absence of bilateral breasts and nipples: Secondary | ICD-10-CM | POA: Diagnosis not present

## 2016-08-01 DIAGNOSIS — Z4801 Encounter for change or removal of surgical wound dressing: Secondary | ICD-10-CM | POA: Diagnosis not present

## 2016-08-01 DIAGNOSIS — Z48817 Encounter for surgical aftercare following surgery on the skin and subcutaneous tissue: Secondary | ICD-10-CM | POA: Diagnosis not present

## 2016-08-01 DIAGNOSIS — Z853 Personal history of malignant neoplasm of breast: Secondary | ICD-10-CM | POA: Diagnosis not present

## 2016-08-01 DIAGNOSIS — Z483 Aftercare following surgery for neoplasm: Secondary | ICD-10-CM | POA: Diagnosis not present

## 2016-08-01 DIAGNOSIS — I890 Lymphedema, not elsewhere classified: Secondary | ICD-10-CM | POA: Diagnosis not present

## 2016-08-01 DIAGNOSIS — Z9221 Personal history of antineoplastic chemotherapy: Secondary | ICD-10-CM | POA: Diagnosis not present

## 2016-08-03 ENCOUNTER — Other Ambulatory Visit: Payer: Self-pay | Admitting: *Deleted

## 2016-08-03 ENCOUNTER — Other Ambulatory Visit: Payer: Self-pay | Admitting: Oncology

## 2016-08-03 DIAGNOSIS — Z483 Aftercare following surgery for neoplasm: Secondary | ICD-10-CM | POA: Diagnosis not present

## 2016-08-03 DIAGNOSIS — C50912 Malignant neoplasm of unspecified site of left female breast: Secondary | ICD-10-CM | POA: Diagnosis not present

## 2016-08-03 DIAGNOSIS — I890 Lymphedema, not elsewhere classified: Secondary | ICD-10-CM | POA: Diagnosis not present

## 2016-08-03 DIAGNOSIS — I10 Essential (primary) hypertension: Secondary | ICD-10-CM | POA: Diagnosis not present

## 2016-08-03 DIAGNOSIS — Z48817 Encounter for surgical aftercare following surgery on the skin and subcutaneous tissue: Secondary | ICD-10-CM | POA: Diagnosis not present

## 2016-08-03 DIAGNOSIS — Z4801 Encounter for change or removal of surgical wound dressing: Secondary | ICD-10-CM | POA: Diagnosis not present

## 2016-08-03 MED ORDER — OXYCODONE-ACETAMINOPHEN 5-325 MG PO TABS: 1.0000 | ORAL_TABLET | Freq: Three times a day (TID) | ORAL | 0 refills | Status: DC | PRN

## 2016-08-03 NOTE — Telephone Encounter (Signed)
This RN spoke with pt per her call stating need for refill on percocet.  Per above recent discharge noted prescription given for hydromorphone per Dr Dalbert Batman ( # [phone] ).  Tiana states the hydromorphone causes severe " upset stomach - I can't take it ".  Pt has used percocet prior to inpt stay with good benefit.  Prescription obtained for pt pick up.

## 2016-08-04 DIAGNOSIS — I10 Essential (primary) hypertension: Secondary | ICD-10-CM | POA: Diagnosis not present

## 2016-08-04 DIAGNOSIS — Z4801 Encounter for change or removal of surgical wound dressing: Secondary | ICD-10-CM | POA: Diagnosis not present

## 2016-08-04 DIAGNOSIS — I890 Lymphedema, not elsewhere classified: Secondary | ICD-10-CM | POA: Diagnosis not present

## 2016-08-04 DIAGNOSIS — Z48817 Encounter for surgical aftercare following surgery on the skin and subcutaneous tissue: Secondary | ICD-10-CM | POA: Diagnosis not present

## 2016-08-04 DIAGNOSIS — Z483 Aftercare following surgery for neoplasm: Secondary | ICD-10-CM | POA: Diagnosis not present

## 2016-08-04 DIAGNOSIS — C50912 Malignant neoplasm of unspecified site of left female breast: Secondary | ICD-10-CM | POA: Diagnosis not present

## 2016-08-07 DIAGNOSIS — Z483 Aftercare following surgery for neoplasm: Secondary | ICD-10-CM | POA: Diagnosis not present

## 2016-08-07 DIAGNOSIS — Z4801 Encounter for change or removal of surgical wound dressing: Secondary | ICD-10-CM | POA: Diagnosis not present

## 2016-08-07 DIAGNOSIS — I890 Lymphedema, not elsewhere classified: Secondary | ICD-10-CM | POA: Diagnosis not present

## 2016-08-07 DIAGNOSIS — I10 Essential (primary) hypertension: Secondary | ICD-10-CM | POA: Diagnosis not present

## 2016-08-07 DIAGNOSIS — Z48817 Encounter for surgical aftercare following surgery on the skin and subcutaneous tissue: Secondary | ICD-10-CM | POA: Diagnosis not present

## 2016-08-07 DIAGNOSIS — C50912 Malignant neoplasm of unspecified site of left female breast: Secondary | ICD-10-CM | POA: Diagnosis not present

## 2016-08-10 ENCOUNTER — Telehealth: Payer: Self-pay | Admitting: Oncology

## 2016-08-10 ENCOUNTER — Other Ambulatory Visit: Payer: Self-pay | Admitting: Oncology

## 2016-08-10 DIAGNOSIS — I890 Lymphedema, not elsewhere classified: Secondary | ICD-10-CM | POA: Diagnosis not present

## 2016-08-10 DIAGNOSIS — Z48817 Encounter for surgical aftercare following surgery on the skin and subcutaneous tissue: Secondary | ICD-10-CM | POA: Diagnosis not present

## 2016-08-10 DIAGNOSIS — C50912 Malignant neoplasm of unspecified site of left female breast: Secondary | ICD-10-CM | POA: Diagnosis not present

## 2016-08-10 DIAGNOSIS — Z4801 Encounter for change or removal of surgical wound dressing: Secondary | ICD-10-CM | POA: Diagnosis not present

## 2016-08-10 DIAGNOSIS — Z483 Aftercare following surgery for neoplasm: Secondary | ICD-10-CM | POA: Diagnosis not present

## 2016-08-10 DIAGNOSIS — I10 Essential (primary) hypertension: Secondary | ICD-10-CM | POA: Diagnosis not present

## 2016-08-10 NOTE — Telephone Encounter (Signed)
sw pt to confirm 1/26 appt per GM email to add pt to 1/26 schedule.

## 2016-08-21 ENCOUNTER — Telehealth: Payer: Self-pay | Admitting: Oncology

## 2016-08-21 ENCOUNTER — Ambulatory Visit (HOSPITAL_BASED_OUTPATIENT_CLINIC_OR_DEPARTMENT_OTHER): Payer: Medicare Other | Admitting: Oncology

## 2016-08-21 ENCOUNTER — Other Ambulatory Visit (HOSPITAL_BASED_OUTPATIENT_CLINIC_OR_DEPARTMENT_OTHER): Payer: Medicare Other

## 2016-08-21 VITALS — BP 132/64 | HR 80 | Temp 98.0°F | Resp 17 | Ht 63.5 in | Wt 124.7 lb

## 2016-08-21 DIAGNOSIS — C773 Secondary and unspecified malignant neoplasm of axilla and upper limb lymph nodes: Secondary | ICD-10-CM

## 2016-08-21 DIAGNOSIS — C50911 Malignant neoplasm of unspecified site of right female breast: Secondary | ICD-10-CM

## 2016-08-21 DIAGNOSIS — Z171 Estrogen receptor negative status [ER-]: Secondary | ICD-10-CM | POA: Diagnosis not present

## 2016-08-21 DIAGNOSIS — C50411 Malignant neoplasm of upper-outer quadrant of right female breast: Secondary | ICD-10-CM | POA: Diagnosis not present

## 2016-08-21 DIAGNOSIS — I890 Lymphedema, not elsewhere classified: Secondary | ICD-10-CM

## 2016-08-21 DIAGNOSIS — C50912 Malignant neoplasm of unspecified site of left female breast: Secondary | ICD-10-CM

## 2016-08-21 LAB — CBC WITH DIFFERENTIAL/PLATELET
BASO%: 0.5 % (ref 0.0–2.0)
Basophils Absolute: 0 10*3/uL (ref 0.0–0.1)
EOS ABS: 0 10*3/uL (ref 0.0–0.5)
EOS%: 0.2 % (ref 0.0–7.0)
HCT: 31.1 % — ABNORMAL LOW (ref 34.8–46.6)
HGB: 10.4 g/dL — ABNORMAL LOW (ref 11.6–15.9)
LYMPH%: 14.5 % (ref 14.0–49.7)
MCH: 34.8 pg — ABNORMAL HIGH (ref 25.1–34.0)
MCHC: 33.4 g/dL (ref 31.5–36.0)
MCV: 104 fL — AB (ref 79.5–101.0)
MONO#: 0.8 10*3/uL (ref 0.1–0.9)
MONO%: 14.2 % — AB (ref 0.0–14.0)
NEUT%: 70.6 % (ref 38.4–76.8)
NEUTROS ABS: 4 10*3/uL (ref 1.5–6.5)
Platelets: 262 10*3/uL (ref 145–400)
RBC: 2.99 10*6/uL — AB (ref 3.70–5.45)
RDW: 14 % (ref 11.2–14.5)
WBC: 5.6 10*3/uL (ref 3.9–10.3)
lymph#: 0.8 10*3/uL — ABNORMAL LOW (ref 0.9–3.3)

## 2016-08-21 LAB — COMPREHENSIVE METABOLIC PANEL
ALT: 8 U/L (ref 0–55)
AST: 13 U/L (ref 5–34)
Albumin: 3.5 g/dL (ref 3.5–5.0)
Alkaline Phosphatase: 50 U/L (ref 40–150)
Anion Gap: 11 mEq/L (ref 3–11)
BILIRUBIN TOTAL: 0.5 mg/dL (ref 0.20–1.20)
BUN: 30.7 mg/dL — ABNORMAL HIGH (ref 7.0–26.0)
CO2: 26 meq/L (ref 22–29)
Calcium: 9.5 mg/dL (ref 8.4–10.4)
Chloride: 98 mEq/L (ref 98–109)
Creatinine: 1.7 mg/dL — ABNORMAL HIGH (ref 0.6–1.1)
EGFR: 35 mL/min/{1.73_m2} — ABNORMAL LOW (ref >90)
GLUCOSE: 111 mg/dL (ref 70–140)
Potassium: 3.8 mEq/L (ref 3.5–5.1)
SODIUM: 135 meq/L — AB (ref 136–145)
TOTAL PROTEIN: 7.4 g/dL (ref 6.4–8.3)

## 2016-08-21 MED ORDER — OXYCODONE-ACETAMINOPHEN 5-325 MG PO TABS: 1.0000 | ORAL_TABLET | Freq: Three times a day (TID) | ORAL | 0 refills | Status: DC | PRN

## 2016-08-21 MED ORDER — OXYCODONE HCL ER 20 MG PO T12A: 20.0000 mg | EXTENDED_RELEASE_TABLET | Freq: Two times a day (BID) | ORAL | 0 refills | Status: DC

## 2016-08-21 NOTE — Progress Notes (Signed)
Patient ID: Janice Powell, female   DOB: Aug 08, 1945, 71 y.o.   MRN: [id number] ID: Janice Powell   DOB: 19-Sep-1945  MR#: [id number]  [id number]  PCP: Janice Pel, MD GYN:  Janice Skates MD OTHER MD: Janice Gibson, MD  CHIEF COMPLAINT:  Hx of Triple Negative Right Breast Cancer; triple negative left axillary adenopathy  CURRENT TREATMENT: observaton  INTERVAL HISTORY:  Janice Powell returns today for follow-up of her metastatic breast cancer accompanied by her husband Janice Powell. Since her last visit here she underwent left total mastectomy, on 07/28/2016 with Thoracoabdominal fasciocutaneous flap from left abdomen to left chest , Split thickness skin graft from left thigh to left chest [id number] cm2 and Application A Cell matrix to left thigh, total 175 cm2  The final pathology (SZA A 18-24) shows the tumor again to be estrogen and progesterone receptor negative. The deep resection margin is broadly positive for carcinoma over an area of 1.2 cm. There was evidence of lymphovascular and perineural invasion.  REVIEW OF SYSTEMS: She has not done well since his surgery. She continues to have significant pain, which is being controlled with Percocet 2 tablets every 4 hours. She is taking 8-10 tablets daily. This is causing her constipation. She hasn't had any bleeding or fever. She is distressed at the cosmetic appearance of the left chest wall. She denies unusual headaches, visual changes, nausea, or vomiting she is not constipated from the narcotics. A detailed review of systems today was otherwise stable.  HISTORY OF PRESENT ILLNESS: From the original intake note:  [name] noted a mass in her right breast sometime in 2011. 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 More recently her husband twisted her arm to get the mass looked at, and she brought it to Dr. Pennie Powell attention. Diagnostic mammography and right ultrasonography at [facility] 03/02/2012  showed a 6.78 cm irregular mass in the inner aspect of the right breast. There was nipple retraction and erythema around the nipple. The largest lymph node was noted in the right axilla. By ultrasound the large irregular hypoechoic mass was noted in the breast with multiple enlarged axillary lymph nodes. Physical exam confirmed a hard breast with erythema around the right nipple extending inferiorly. The nipple is slightly inverted.  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   Her subsequent history is as detailed below  PAST MEDICAL HISTORY: Past Medical History:  Diagnosis Date  . Breast cancer ([place]) 03/08/13   right breast - Invasive Ductal Carcinoma   . GERD (gastroesophageal reflux disease)    occ  . Hx antineoplastic chemotherapy    two cycles of neoadjuvant cyclophosphamide, docetaxel, doxorubicin ["TAC"] at standard doses with neulasta support; refused neulasta with cycles 2 and 4; tolerated treatment poorly (b) docetaxel was held for final 4 cycles; completed cycle 6 neoadjuvant chemotherapy 12/27/2012    . Hypertension   . Osteopenia   . Pneumonia    hx  . Primary malignant neoplasm of left breast with stage 2 nodal metastasis per American Joint Committee on Cancer 7th edition (N2) ([place]) 01/09/2016  . S/P radiation therapy    1) Right Chest Wall and IM nodes / 50 Gy in 25 fractions /2) Right Supraclavicular fossa / 50 Gy in 25 fractions/1) Right Chest Wall and IM nodes / 50 Gy in 25 fractions /2) Right Supraclavicular fossa / 50 Gy in 25 fractions/3) Right  Posterior Axillary boost / 6.55 Gy in 25 fractions/4) Right Chest Wall Scar boost / 10 Gy in 5 fractions      . Wears glasses   GERD osteopenia  PAST SURGICAL HISTORY: Past Surgical History:  Procedure Laterality Date  . APPENDECTOMY  1992  . APPLICATION OF A-CELL OF EXTREMITY Left 07/28/2016   Procedure: APPLICATION OF A-CELL OF TO LEFT THIGH;   Surgeon: Janice Limbo, MD;  Location: [place];  Service: Plastics;  Laterality: Left;  . APPLICATION OF WOUND VAC Left 07/28/2016   Procedure: APPLICATION OF WOUND VAC, left breast;  Surgeon: Janice Limbo, MD;  Location: [facility];  Service: Plastics;  Laterality: Left;  . BREAST BIOPSY Right   . CHOLECYSTECTOMY  2000  . MASTECTOMY Right 03/13/2013  . MASTECTOMY MODIFIED RADICAL Right 03/13/2013   Procedure: MASTECTOMY MODIFIED RADICAL;  Surgeon: Janice Hector, MD;  Location: [place];  Service: General;  Laterality: Right;  . MASTECTOMY MODIFIED RADICAL Left 07/28/2016   Procedure: MASTECTOMY MODIFIED RADICAL;  Surgeon: Janice Skates, MD;  Location: [facility];  Service: General;  Laterality: Left;  . PORT-A-CATH REMOVAL  03/13/2013  . PORT-A-CATH REMOVAL Left 03/13/2013   Procedure: REMOVAL PORT-A-CATH;  Surgeon: Janice Hector, MD;  Location: [facility];  Service: General;  Laterality: Left;  . PORTACATH PLACEMENT  08/15/2012   Procedure: INSERTION PORT-A-CATH;  Surgeon: Janice Hector, MD;  Location: [facility];  Service: General;  Laterality: Left;  . PORTACATH PLACEMENT N/A 01/09/2016   Procedure: INSERTION PORT-A-CATH;  Surgeon: Janice Skates, MD;  Location: [facility];  Service: General;  Laterality: N/A;  . SKIN SPLIT GRAFT N/A 07/28/2016   Procedure: SKIN GRAFT SPLIT THICKNESS from left thigh to left chest;  Surgeon: Janice Limbo, MD;  Location: [facility];  Service: Plastics;  Laterality: N/A;  . TUBAL LIGATION  1979    FAMILY HISTORY Family History  Problem Relation Age of Onset  . Heart disease Mother    the patient's father died in his sleep at age 71. The patient's mother died at the age of 28 from a myocardial infarction. The patient has one brother and 2 sisters, all surviving. There is no history of breast or ovarian cancer in the family.  GYNECOLOGIC HISTORY:  (Reviewed 12/07/2013) Menarche age 50, first live birth age 40, she is GX P5, menopause age 17. She did not take hormone  replacement.  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 Her husband Janice Powell worked in the post office 42 years, and is also now retired. He is a former Company secretary. Son [name] lives in [place] and manages an [employer] store. Daughter Bobbe Pilz also lives in [place] and manages the IT Department at [employer]. Son Lunabelle Carucci III is a Engineer, materials. Daughter [name] is a Actuary for [employer]. Son [name] is an Actuary. The patient has 8 grandchildren. She attends [employer]   ADVANCED DIRECTIVES: Not in place  HEALTH MAINTENANCE:  (Updated 12/07/2013) Social History  Substance Use Topics  . Smoking status: Never Smoker  . Smokeless tobacco: Never Used  . Alcohol use No     Colonoscopy: Not on file  PAP: Not on file  Bone density:  January 2011   Lipid panel:  Not on file/ Dr. Shelia Media  Allergies  Allergen Reactions  . Hydromorphone Nausea Only    Current Outpatient Prescriptions  Medication Sig Dispense Refill  . cefUROXime (CEFTIN) 500  MG tablet Take 1 tablet (500 mg total) by mouth daily. 4 tablet 0  . cloNIDine (CATAPRES - DOSED IN MG/24 HR) 0.2 mg/24hr patch     . lisinopril (PRINIVIL,ZESTRIL) 10 MG tablet TAKE 1 TABLET EVERY DAY 90 tablet 1  . metoprolol succinate (TOPROL-XL) 50 MG 24 hr tablet TAKE 1 TABLET EVERY DAY WITH OR IMMEDIATELY FOLLOWING A MEAL 90 tablet 0  . oxyCODONE (OXYCONTIN) 20 mg 12 hr tablet Take 1 tablet (20 mg total) by mouth every 12 (twelve) hours. 60 tablet 0  . oxyCODONE-acetaminophen (PERCOCET/ROXICET) 5-325 MG tablet Take 1-2 tablets by mouth every 8 (eight) hours as needed for severe pain. 60 tablet 0  . potassium chloride (K-DUR) 10 MEQ tablet Take 1 tablet (10 mEq total) by mouth daily. (Patient taking differently: Take 10 mEq by mouth daily as needed (for fatigue/sluggish). ) 60 tablet 5  . triamterene-hydrochlorothiazide (MAXZIDE) 75-50 MG  tablet Take 1 tablet by mouth daily. 90 tablet 6   No current facility-administered medications for this visit.     OBJECTIVE: Middle-aged [place] American woman Examined in a wheelchair. Vitals:   08/21/16 0951  BP: 132/64  Pulse: 80  Resp: 17  Temp: 98 F (36.7 C)     Body mass index is 21.74 kg/m.    ECOG FS: 2 Filed Weights   08/21/16 0951  Weight: 124 lb 11.2 oz (56.6 kg)    Sclerae unicteric, pupils round and equal Oropharynx clear and moist-- no thrush or other lesions No cervical or supraclavicular adenopathy Lungs no rales or rhonchi Heart regular rate and rhythm Abd soft, nontender, positive bowel sounds MSK no focal spinal tenderness, chronic left upper extremity lymphedema Neuro: nonfocal, well oriented, appropriate affect Breasts: The right breast is status post remote mastectomy, some erythema and redness over the chest wall area. The left chest wall is imaged below.  Left chest wall status post January 2018 mastectomy, photograph 08/21/2016     Area of concern in the right chest wall 06/05/2016      Photo of left breast 04/20/2016      LAB RESULTS: CBC    Component Value Date/Time   WBC 5.6 08/21/2016 0941   WBC 9.4 07/29/2016 0515   RBC 2.99 (L) 08/21/2016 0941   RBC 2.84 (L) 07/29/2016 0515   HGB 10.4 (L) 08/21/2016 0941   HCT 31.1 (L) 08/21/2016 0941   PLT 262 08/21/2016 0941   MCV 104.0 (H) 08/21/2016 0941   MCH 34.8 (H) 08/21/2016 0941   MCH 34.2 (H) 07/29/2016 0515   MCHC 33.4 08/21/2016 0941   MCHC 33.6 07/29/2016 0515   RDW 14.0 08/21/2016 0941   LYMPHSABS 0.8 (L) 08/21/2016 0941   MONOABS 0.8 08/21/2016 0941   EOSABS 0.0 08/21/2016 0941   BASOSABS 0.0 08/21/2016 0941    CMP     Component Value Date/Time   NA 135 (L) 08/21/2016 0941   K 3.8 08/21/2016 0941   CL 104 07/29/2016 0515   CL 107 01/11/2013 0853   CO2 26 08/21/2016 0941   GLUCOSE 111 08/21/2016 0941   GLUCOSE 123 (H) 01/11/2013 0853   BUN 30.7 (H)  08/21/2016 0941   CREATININE 1.7 (H) 08/21/2016 0941   CALCIUM 9.5 08/21/2016 0941   PROT 7.4 08/21/2016 0941   ALBUMIN 3.5 08/21/2016 0941   AST 13 08/21/2016 0941   ALT 8 08/21/2016 0941   ALKPHOS 50 08/21/2016 0941   BILITOT 0.50 08/21/2016 0941   GFRNONAA 45 (L) 07/29/2016 0515   GFRAA 52 (  L) 07/29/2016 0515       STUDIES: Dg Chest 2 View  Result Date: 07/25/2016 CLINICAL DATA:  71 year old female with shortness of breath. Breast cancer. Initial encounter. EXAM: CHEST  2 VIEW COMPARISON:  07/24/2016 and earlier.  V/Q scan 07/24/2016. FINDINGS: Patchy opacity at the left lung base appears further increased since 07/24/2016. Small associated left pleural effusion. No pneumothorax or pulmonary edema. The right lung remains clear. Stable cardiac size and mediastinal contours. Stable right chest porta cath. Stable visualized osseous structures. Stable cholecystectomy clips. Negative visible bowel gas pattern. IMPRESSION: 1. Patchy opacity at the left lung base appears further increased since yesterday. Small associated left pleural effusion. This is nonspecific, especially in light of the indeterminate V/Q scan results yesterday. 2. Elsewhere the lungs remain clear. Electronically Signed   By: Genevie Ann M.D.   On: 07/25/2016 09:54   Dg Chest 2 View  Result Date: 07/24/2016 CLINICAL DATA:  Chest pain EXAM: CHEST  2 VIEW COMPARISON:  07/22/2016 FINDINGS: Right Port-A-Cath remains in place, unchanged. Increasing density at the left base could reflect atelectasis or developing infiltrate. Right lung is clear. Heart is normal size. No effusions. IMPRESSION: Increasing left basilar atelectasis or infiltrate. Electronically Signed   By: Rolm Baptise M.D.   On: 07/24/2016 13:15   Nm Pulmonary Perf And Vent  Result Date: 07/24/2016 CLINICAL DATA:  71 year old female with shortness of breath and chest tightness for 2 weeks. Breast cancer. Initial encounter. EXAM: NUCLEAR MEDICINE VENTILATION -  PERFUSION LUNG SCAN TECHNIQUE: Ventilation images were obtained in multiple projections using inhaled aerosol Tc-98m DTPA. Perfusion images were obtained in multiple projections after intravenous injection of Tc-69m MAA. RADIOPHARMACEUTICALS:  31 mCi Technetium-28m DTPA aerosol inhalation and 4 mCi Technetium-81m MAA IV COMPARISON:  Chest radiographs 1305 hours today. Chest CT 06/02/2016 FINDINGS: Ventilation: Heterogeneous ventilation imaging, appears in part related to GI tract contamination. However, there is a moderate size ventilation defect at the left lung base. No other ventilation defect. Perfusion: Deficit at the left lung base, corresponding to the ventilation defect. No other perfusion defect. Chest radiographs today demonstrate a smaller area of abnormal opacity at the left lung base. IMPRESSION: Intermediate probability for acute pulmonary embolus; there is a single triple matched left side lower lung zone defect. Electronically Signed   By: Genevie Ann M.D.   On: 07/24/2016 15:59    ASSESSMENT: 71 y.o. [place] woman   (1)  s/p Right breast upper outer quadrant and axillary node biopsy 03/08/2012 for a clinical T4, N1-2', stage IIIB invasive ductal carcinoma, grade 3, triple negative, with an MIB-1 of 93%.  (2) definitive staging and treatment delayed as patient canceled staging studies and tried alternative treatments; with eventual progression  (3) neoadjuvant chemotherapy started 08/23/12  (a) received two cycles of neoadjuvant cyclophosphamide, docetaxel, doxorubicin ["TAC"] at standard doses with neulasta support; refused neulasta with cycles 2 and 4; tolerated treatment poorly  (b) docetaxel was held for final 4 cycles; completed cycle 6 neoadjuvant chemotherapy 12/27/2012    (4) status post right modified radical mastectomy 03/13/2013 showing no residual carcinoma in the breast, but nine of 9 axillary lymph nodes sampled involved with macro metastases (ypTX, ypN2, stage IIIA)--  repeat prognostic panel again triple negative  (5) radiation therapy completed 05/31/2013  (6) patient opted against reconstruction  METASTATIC DISEASE: (7) left axillary adenopathy noted on mammography June 2015, confirmed on CT scan November 2015  (a) patient refused further workup or treatment on multiple visits (see notes)  (8) chest  CT 12/16/2015 shows progressive left axillary and subpectoral adenopathy, left upper extremity lymphedema, left breast soft tissue fullness and skin thickening  (a) left axillary lymph node biopsy 12/26/2015 confirms metastatic carcinoma, triple negative  (9) started gemcitabine/carboplatin 01/13/2016, repeated day 1 and day 8 of each 21 cycle   (a) Day 8 cycle 2 held because of low counts  (b) restaging CT scan of the chest 04/09/2016 after 6 cycles of chemotherapy shows evidence of mixed response  (c) treatments changed to every 2 weeks beginning with 04/20/2016 dose  (10) poorly controlled hypertension: Maxide added to lisinopril and metoprolol 04/13/2016. TTS-2 added November 2017  (11) left upper extremity lymphedema  (12) status post left total mastectomy 07/28/2016 with 1. Thoracoabdominal fasciocutaneous flap from left abdomen to left chest 2. Split thickness skin graft from left thigh to left chest 150 cm2 3. Application A Cell matrix to left thigh, total 175 cm2  PLAN: I spent well over 30 minutes today with the patient and her husband most of that in counseling regarding her complex situation. She is very dissatisfied with her surgery. She does not like the way her chest looks. She has significant pain. She doesn't know why her lymph nodes were not removed. She understood Dr. Dalbert Batman to have said that he did not want to see her anymore. She is very upset about all this.  She also understood that the surgeon to have said that her chemotherapy did her no good. She wanted to know why I would give her chemotherapy food wasn't going to help her.  I  reassured her that Dr. Dalbert Batman could not have said that he does not want to or need to see her again and in fact he has scheduled an appointment with her next week. We reviewed the fact that the chemotherapy did shrink her tumor to some extent although it did not clear it. The chemotherapy she received a standard and its among the most effective available for her situation. I explained she also needs to see radiation oncology and I would like to set her up for repeat scans to serve as a new baseline regarding her cancer.  She is very dissatisfied with the entire situation. She does not want any scans and she refuses to see radiation oncology; she refuses any more chemotherapy. She just wants for things to "settled" and then she will "see what she is going to do".  We discussed her pain in detail and particularly the difference between long-acting and short acting agents. I wrote her for OxyContin 20 mg to take twice daily and I refilled her Percocet. I'm going to see her again next week chiefly to continue to titrate her pain medicine until we have it at a more manageable stage. At that point also we will see if she is amenable to restaging studies.  I was very glad that her husband was present at today's visit and I encouraged him to continue to come with the patient. If her condition continues to deteriorate he will be her healthcare part of attorney and we will have to make some difficult decisions regarding her ultimate management.      Chauncey Cruel, MD    08/23/2016

## 2016-08-21 NOTE — Telephone Encounter (Signed)
lvm to inform pt of 2/1 appt at 1145 am per GM LOS

## 2016-08-24 ENCOUNTER — Other Ambulatory Visit: Payer: Self-pay | Admitting: Oncology

## 2016-08-24 NOTE — Progress Notes (Unsigned)
I received the following note from Dr Dalbert Batman which I wanted to include in the record:   [name], MD  Chauncey Cruel, MD  Cc: [name], MD        Gus,  Read your office note. I am sad that Ms. Bibb is so negative about her circumstances, but I don't blame her for being negative in general.   I did not say any of the things she said I did, however.  She failed to show for her appointment last week and we called her to reschedule her.  I chose not to perform an axillary dissection at the time of surgery since she had incurable disease on the chest wall, already had significant lymphedema and axillary surgery would only worsen her QOL. This was explained to her.  I said nothing negative about chemotherapy response although we both know it was less than dramatic due to her biology.  I believe that her self imposed delays in intervention have contributed to the outcomes on both sides.   Her left chest wall wound will break down and she will be left with wound care. There is nothing Brinda or I can do. When she is ready, home health nursing can be provided by [facility].   I hope to continue to participate in her care.    [place]    My own feeling is that it was a generous thing for the surgeons to attempt to repair the destruction the breast cancer caused in her breast and chest wall. I am hoping to be able to help Rosie through anger frustration and depression to a realization of her condition and the limited options we have for dealing with it. It will be helpful to involve her husband as much as possible as he will eventually have to participate in/make some difficult decisions

## 2016-08-27 ENCOUNTER — Ambulatory Visit: Payer: Medicare Other | Admitting: Physical Therapy

## 2016-08-27 ENCOUNTER — Ambulatory Visit (HOSPITAL_BASED_OUTPATIENT_CLINIC_OR_DEPARTMENT_OTHER): Payer: Medicare Other | Admitting: Oncology

## 2016-08-27 VITALS — BP 142/65 | HR 91 | Temp 98.2°F | Resp 18 | Ht 63.5 in | Wt 127.7 lb

## 2016-08-27 DIAGNOSIS — C50411 Malignant neoplasm of upper-outer quadrant of right female breast: Secondary | ICD-10-CM

## 2016-08-27 DIAGNOSIS — I890 Lymphedema, not elsewhere classified: Secondary | ICD-10-CM

## 2016-08-27 DIAGNOSIS — C773 Secondary and unspecified malignant neoplasm of axilla and upper limb lymph nodes: Secondary | ICD-10-CM | POA: Diagnosis not present

## 2016-08-27 DIAGNOSIS — C50812 Malignant neoplasm of overlapping sites of left female breast: Secondary | ICD-10-CM | POA: Insufficient documentation

## 2016-08-27 DIAGNOSIS — Z171 Estrogen receptor negative status [ER-]: Secondary | ICD-10-CM | POA: Diagnosis not present

## 2016-08-27 DIAGNOSIS — G893 Neoplasm related pain (acute) (chronic): Secondary | ICD-10-CM | POA: Diagnosis not present

## 2016-08-27 DIAGNOSIS — C50911 Malignant neoplasm of unspecified site of right female breast: Secondary | ICD-10-CM

## 2016-08-27 MED ORDER — OXYCODONE-ACETAMINOPHEN 5-325 MG PO TABS: 1.0000 | ORAL_TABLET | Freq: Three times a day (TID) | ORAL | 0 refills | Status: DC | PRN

## 2016-08-27 MED ORDER — OXYCODONE HCL ER 40 MG PO T12A: 40.0000 mg | EXTENDED_RELEASE_TABLET | Freq: Two times a day (BID) | ORAL | 0 refills | Status: DC

## 2016-08-27 NOTE — Progress Notes (Signed)
Patient ID: Janice Powell, female   DOB: [birth year], 71 y.o.   MRN: [id number] ID: Janice Powell   DOB: 29-Oct-1945  MR#: [id number]  [id number]  PCP: Horatio Pel, MD GYN:  SUFanny Skates MD OTHER MD: Eppie Gibson, MD  CHIEF COMPLAINT:  Hx of Triple Negative Right Breast Cancer; triple negative left axillary adenopathy  CURRENT TREATMENT: observaton  INTERVAL HISTORY:  Sinia returns today for follow-up of her estrogen receptor negative breast cancer. She is still recovering from her recent surgery. She is working closely with Clinical cytogeneticist. She is here primarily to continue to titrate her pain medicines and to discuss how we are going to proceed from this point.  As far as the pain medicine is concerned, she has been taking OxyContin 20 mg twice daily without any significant decrease in her breakthrough oxycodone. She is managing the bowel prophylaxis well with Senokot and prunes. She does not get nauseated or confused from the narcotics. She is expressing some concern that she might become addicted.  The patient's husband did drive her here today but she did not want him in the room during the visit and he waited outside in the waiting area  REVIEW OF SYSTEMS: Aside from the pain problems she says she is doing "fine". She says the pain is "well-controlled". She denies constipation. Recall that admission of a negative for this patient is not different from causing that negative. This limits my ability to find out exactly what is going on at home, especially with her husband not in the room. Nevertheless it is clear that she is still quite uncomfortable. A detailed review of systems today was otherwise noncontributory  HISTORY OF PRESENT ILLNESS: From the original intake note:  [name] noted a mass in her right breast sometime in 2011. 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 More recently her husband twisted her arm to get the  mass looked at, and she brought it to Dr. Pennie Banter attention. Diagnostic mammography and right ultrasonography at [facility] 03/02/2012 showed a 6.78 cm irregular mass in the inner aspect of the right breast. There was nipple retraction and erythema around the nipple. The largest lymph node was noted in the right axilla. By ultrasound the large irregular hypoechoic mass was noted in the breast with multiple enlarged axillary lymph nodes. Physical exam confirmed a hard breast with erythema around the right nipple extending inferiorly. The nipple is slightly inverted.  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   Her subsequent history is as detailed below  PAST MEDICAL HISTORY: Past Medical History:  Diagnosis Date  . Breast cancer ([place]) 03/08/13   right breast - Invasive Ductal Carcinoma   . GERD (gastroesophageal reflux disease)    occ  . Hx antineoplastic chemotherapy    two cycles of neoadjuvant cyclophosphamide, docetaxel, doxorubicin ["TAC"] at standard doses with neulasta support; refused neulasta with cycles 2 and 4; tolerated treatment poorly (b) docetaxel was held for final 4 cycles; completed cycle 6 neoadjuvant chemotherapy 12/27/2012    . Hypertension   . Osteopenia   . Pneumonia    hx  . Primary malignant neoplasm of left breast with stage 2 nodal metastasis per American Joint Committee on Cancer 7th edition (N2) ([place]) 01/09/2016  . S/P radiation therapy    1) Right Chest Wall and IM nodes / 50 Gy in 25 fractions /2) Right Supraclavicular  fossa / 50 Gy in 25 fractions/1) Right Chest Wall and IM nodes / 50 Gy in 25 fractions /2) Right Supraclavicular fossa / 50 Gy in 25 fractions/3) Right Posterior Axillary boost / 6.55 Gy in 25 fractions/4) Right Chest Wall Scar boost / 10 Gy in 5 fractions      . Wears glasses   GERD osteopenia  PAST SURGICAL HISTORY: Past Surgical History:  Procedure Laterality Date    . APPENDECTOMY  1992  . APPLICATION OF A-CELL OF EXTREMITY Left 07/28/2016   Procedure: APPLICATION OF A-CELL OF TO LEFT THIGH;  Surgeon: Irene Limbo, MD;  Location: [place];  Service: Plastics;  Laterality: Left;  . APPLICATION OF WOUND VAC Left 07/28/2016   Procedure: APPLICATION OF WOUND VAC, left breast;  Surgeon: Irene Limbo, MD;  Location: [place];  Service: Plastics;  Laterality: Left;  . BREAST BIOPSY Right   . CHOLECYSTECTOMY  2000  . MASTECTOMY Right 03/13/2013  . MASTECTOMY MODIFIED RADICAL Right 03/13/2013   Procedure: MASTECTOMY MODIFIED RADICAL;  Surgeon: [name], MD;  Location: [place];  Service: General;  Laterality: Right;  . MASTECTOMY MODIFIED RADICAL Left 07/28/2016   Procedure: MASTECTOMY MODIFIED RADICAL;  Surgeon: [name], MD;  Location: [place];  Service: General;  Laterality: Left;  . PORT-A-CATH REMOVAL  03/13/2013  . PORT-A-CATH REMOVAL Left 03/13/2013   Procedure: REMOVAL PORT-A-CATH;  Surgeon: [name], MD;  Location: [place];  Service: General;  Laterality: Left;  . PORTACATH PLACEMENT  08/15/2012   Procedure: INSERTION PORT-A-CATH;  Surgeon: [name], MD;  Location: [place];  Service: General;  Laterality: Left;  . PORTACATH PLACEMENT N/A 01/09/2016   Procedure: INSERTION PORT-A-CATH;  Surgeon: [name], MD;  Location: [facility];  Service: General;  Laterality: N/A;  . SKIN SPLIT GRAFT N/A 07/28/2016   Procedure: SKIN GRAFT SPLIT THICKNESS from left thigh to left chest;  Surgeon: Irene Limbo, MD;  Location: [place];  Service: Plastics;  Laterality: N/A;  . TUBAL LIGATION  1979    FAMILY HISTORY Family History  Problem Relation Age of Onset  . Heart disease Mother    the patient's father died in his sleep at age 51. The patient's mother died at the age of 59 from a myocardial infarction. The patient has one brother and 2 sisters, all surviving. There is no history of breast or ovarian cancer in the family.  GYNECOLOGIC HISTORY:   (Reviewed 12/07/2013) Menarche age 88, first live birth age 17, she is GX P5, menopause age 34. She did not take hormone replacement.  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 Her husband [name] worked in the post office 25 years, and is also now retired. He is a former Company secretary. Son [name] lives in [place] and manages an [employer] store. Daughter Ferrah Mcclernon also lives in [place] and manages the IT Department at [employer]. Son Akevia Miesner III is a Engineer, materials. Daughter [name] is a Actuary for [employer]. Son [name] is an Actuary. The patient has 8 grandchildren. She attends [employer]   ADVANCED DIRECTIVES: Not in place  HEALTH MAINTENANCE:  (Updated 12/07/2013) Social History  Substance Use Topics  . Smoking status: Never Smoker  . Smokeless tobacco: Never Used  . Alcohol use No     Colonoscopy: Not on file  PAP: Not on file  Bone density:  January 2011   Lipid panel:  Not on file/ Dr. Shelia Media  Allergies  Allergen Reactions  . Hydromorphone Nausea Only    Current Outpatient Prescriptions  Medication Sig Dispense Refill  . cefUROXime (CEFTIN) 500 MG tablet Take 1 tablet (500 mg total) by mouth daily. 4 tablet 0  . cloNIDine (CATAPRES - DOSED IN MG/24 HR) 0.2 mg/24hr patch     . lisinopril (PRINIVIL,ZESTRIL) 10 MG tablet TAKE 1 TABLET EVERY DAY 90 tablet 1  . metoprolol succinate (TOPROL-XL) 50 MG 24 hr tablet TAKE 1 TABLET EVERY DAY WITH OR IMMEDIATELY FOLLOWING A MEAL 90 tablet 0  . oxyCODONE (OXYCONTIN) 20 mg 12 hr tablet Take 1 tablet (20 mg total) by mouth every 12 (twelve) hours. 60 tablet 0  . oxyCODONE-acetaminophen (PERCOCET/ROXICET) 5-325 MG tablet Take 1-2 tablets by mouth every 8 (eight) hours as needed for severe pain. 60 tablet 0  . potassium chloride (K-DUR) 10 MEQ tablet Take 1 tablet (10 mEq total) by mouth daily. (Patient taking differently: Take 10 mEq  by mouth daily as needed (for fatigue/sluggish). ) 60 tablet 5  . triamterene-hydrochlorothiazide (MAXZIDE) 75-50 MG tablet Take 1 tablet by mouth daily. 90 tablet 6   No current facility-administered medications for this visit.     OBJECTIVE: Middle-aged [place] American woman Who appears a bit more frail Vitals:   08/27/16 1203  BP: (!) 142/65  Pulse: 91  Resp: 18  Temp: 98.2 F (36.8 C)     Body mass index is 22.27 kg/m.    ECOG FS: 2 Filed Weights   08/27/16 1203  Weight: 127 lb 11.2 oz (57.9 kg)    Sclerae unicteric, EOMs intact Oropharynx clear and moist No cervical or supraclavicular adenopathy Lungs no rales or rhonchi Heart regular rate and rhythm Abd soft, nontender, positive bowel sounds MSK no focal spinal tenderness, no upper extremity lymphedema Neuro: nonfocal, well oriented, appropriate affect Breasts: Status post bilateral mastectomy. The left chest wall area shows some eschar formation but appears to be healing as expected. There continues to be in duration and erythema on the right chest wall which the patient largely ignores   Left chest wall status post January 2018 mastectomy, photograph 08/21/2016     Area of concern in the right chest wall 06/05/2016      Photo of left breast 04/20/2016      LAB RESULTS: CBC    Component Value Date/Time   WBC 5.6 08/21/2016 0941   WBC 9.4 07/29/2016 0515   RBC 2.99 (L) 08/21/2016 0941   RBC 2.84 (L) 07/29/2016 0515   HGB 10.4 (L) 08/21/2016 0941   HCT 31.1 (L) 08/21/2016 0941   PLT 262 08/21/2016 0941   MCV 104.0 (H) 08/21/2016 0941   MCH 34.8 (H) 08/21/2016 0941   MCH 34.2 (H) 07/29/2016 0515   MCHC 33.4 08/21/2016 0941   MCHC 33.6 07/29/2016 0515   RDW 14.0 08/21/2016 0941   LYMPHSABS 0.8 (L) 08/21/2016 0941   MONOABS 0.8 08/21/2016 0941   EOSABS 0.0 08/21/2016 0941   BASOSABS 0.0 08/21/2016 0941    CMP     Component Value Date/Time   NA 135 (L) 08/21/2016 0941   K 3.8 08/21/2016  0941   CL 104 07/29/2016 0515   CL 107 01/11/2013 0853   CO2 26 08/21/2016 0941   GLUCOSE 111 08/21/2016 0941   GLUCOSE 123 (H) 01/11/2013 0853   BUN 30.7 (H) 08/21/2016 0941   CREATININE 1.7 (H) 08/21/2016 0941   CALCIUM 9.5 08/21/2016 0941   PROT 7.4 08/21/2016 0941  ALBUMIN 3.5 08/21/2016 0941   AST 13 08/21/2016 0941   ALT 8 08/21/2016 0941   ALKPHOS 50 08/21/2016 0941   BILITOT 0.50 08/21/2016 0941   GFRNONAA 45 (L) 07/29/2016 0515   GFRAA 52 (L) 07/29/2016 0515       STUDIES: No results found.  ASSESSMENT: 71 y.o. [place] woman   (1)  s/p Right breast upper outer quadrant and axillary node biopsy 03/08/2012 for a clinical T4, N1-2', stage IIIB invasive ductal carcinoma, grade 3, triple negative, with an MIB-1 of 93%.  (2) definitive staging and treatment delayed as patient canceled staging studies and tried alternative treatments; with eventual progression  (3) neoadjuvant chemotherapy started 08/23/12  (a) received two cycles of neoadjuvant cyclophosphamide, docetaxel, doxorubicin ["TAC"] at standard doses with neulasta support; refused neulasta with cycles 2 and 4; tolerated treatment poorly  (b) docetaxel was held for final 4 cycles; completed cycle 6 neoadjuvant chemotherapy 12/27/2012    (4) status post right modified radical mastectomy 03/13/2013 showing no residual carcinoma in the breast, but nine of 9 axillary lymph nodes sampled involved with macro metastases (ypTX, ypN2, stage IIIA)-- repeat prognostic panel again triple negative  (5) radiation therapy completed 05/31/2013  (6) patient opted against reconstruction  METASTATIC DISEASE: (7) left axillary adenopathy noted on mammography June 2015, confirmed on CT scan November 2015  (a) patient refused further workup or treatment on multiple visits (see notes)  (8) chest CT 12/16/2015 shows progressive left axillary and subpectoral adenopathy, left upper extremity lymphedema, left breast soft tissue  fullness and skin thickening  (a) left axillary lymph node biopsy 12/26/2015 confirms metastatic carcinoma, triple negative  (9) started gemcitabine/carboplatin 01/13/2016, repeated day 1 and day 8 of each 21 cycle   (a) Day 8 cycle 2 held because of low counts  (b) restaging CT scan of the chest 04/09/2016 after 6 cycles of chemotherapy shows evidence of mixed response  (c) treatments changed to every 2 weeks beginning with 04/20/2016 dose  (10) poorly controlled hypertension: Maxide added to lisinopril and metoprolol 04/13/2016. TTS-2 added November 2017  (11) left upper extremity lymphedema  (12) status post left total mastectomy 07/28/2016 with 1. Thoracoabdominal fasciocutaneous flap from left abdomen to left chest 2. Split thickness skin graft from left thigh to left chest 150 cm2 3. 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 I assured her that I did not feel she needed to apologized and that I had heard frustration, and GERD, and concern. She tells me that she does not know what she is going to do and is waiting for got to tell her what she is very clear that she doesn't want any more chemotherapy or radiation. At this point she is not willing to go through any restaging studies.  I brought up the possibility of pembrolizumab, which we had previously discussed. She did not reject this out of hand but does not wish to pursue this at present.  As far as pain management is concerned I increased her OxyContin to 40 mg twice daily and refilled her oxycodone. I am giving her every 2 week supplies of medication until she is on a stable dose at which point I will be able to brought in the follow-up interval.  My plan for this patient is either to consider pembrolizumab or to consider hospice. In either case we do  not have clear advanced directives at this point. I will see if we can have these clarified at some point in the next several visits.   Chauncey Cruel, MD    08/27/2016

## 2016-08-31 ENCOUNTER — Ambulatory Visit: Payer: Medicare Other | Attending: Plastic Surgery | Admitting: Physical Therapy

## 2016-08-31 ENCOUNTER — Encounter: Payer: Self-pay | Admitting: Physical Therapy

## 2016-08-31 DIAGNOSIS — M25612 Stiffness of left shoulder, not elsewhere classified: Secondary | ICD-10-CM | POA: Insufficient documentation

## 2016-08-31 DIAGNOSIS — M25611 Stiffness of right shoulder, not elsewhere classified: Secondary | ICD-10-CM

## 2016-08-31 DIAGNOSIS — I972 Postmastectomy lymphedema syndrome: Secondary | ICD-10-CM | POA: Insufficient documentation

## 2016-08-31 DIAGNOSIS — M25512 Pain in left shoulder: Secondary | ICD-10-CM | POA: Insufficient documentation

## 2016-08-31 DIAGNOSIS — M6281 Muscle weakness (generalized): Secondary | ICD-10-CM | POA: Diagnosis not present

## 2016-08-31 NOTE — Therapy (Signed)
[place], [place], [address] Phone: [phone]   Fax:  [phone]  Physical Therapy Evaluation  Patient Details  Name: Janice Powell MRN: [id number] Date of Birth: Jun 15, 1946 Referring Provider: Thimmappa  Encounter Date: 08/31/2016      PT End of Session - 08/31/16 1711    Visit Number 1   Number of Visits 13   Date for PT Re-Evaluation 09/28/16   PT Start Time 1400   PT Stop Time 1428   PT Time Calculation (min) 28 min   Activity Tolerance Patient tolerated treatment well   Behavior During Therapy WFL for tasks assessed/performed      Past Medical History:  Diagnosis Date  . Breast cancer ([place]) 03/08/13   right breast - Invasive Ductal Carcinoma   . GERD (gastroesophageal reflux disease)    occ  . Hx antineoplastic chemotherapy    two cycles of neoadjuvant cyclophosphamide, docetaxel, doxorubicin ["TAC"] at standard doses with neulasta support; refused neulasta with cycles 2 and 4; tolerated treatment poorly (b) docetaxel was held for final 4 cycles; completed cycle 6 neoadjuvant chemotherapy 12/27/2012    . Hypertension   . Osteopenia   . Pneumonia    hx  . Primary malignant neoplasm of left breast with stage 2 nodal metastasis per American Joint Committee on Cancer 7th edition (N2) ([place]) 01/09/2016  . S/P radiation therapy    1) Right Chest Wall and IM nodes / 50 Gy in 25 fractions /2) Right Supraclavicular fossa / 50 Gy in 25 fractions/1) Right Chest Wall and IM nodes / 50 Gy in 25 fractions /2) Right Supraclavicular fossa / 50 Gy in 25 fractions/3) Right Posterior Axillary boost / 6.55 Gy in 25 fractions/4) Right Chest Wall Scar boost / 10 Gy in 5 fractions      . Wears glasses     Past Surgical History:  Procedure Laterality Date  . APPENDECTOMY  1992  . APPLICATION OF A-CELL OF EXTREMITY Left 07/28/2016   Procedure: APPLICATION OF A-CELL OF TO LEFT THIGH;  Surgeon: Irene Limbo,  MD;  Location: [facility];  Service: Plastics;  Laterality: Left;  . APPLICATION OF WOUND VAC Left 07/28/2016   Procedure: APPLICATION OF WOUND VAC, left breast;  Surgeon: Irene Limbo, MD;  Location: [facility];  Service: Plastics;  Laterality: Left;  . BREAST BIOPSY Right   . CHOLECYSTECTOMY  2000  . MASTECTOMY Right 03/13/2013  . MASTECTOMY MODIFIED RADICAL Right 03/13/2013   Procedure: MASTECTOMY MODIFIED RADICAL;  Surgeon: [name], MD;  Location: [facility];  Service: General;  Laterality: Right;  . MASTECTOMY MODIFIED RADICAL Left 07/28/2016   Procedure: MASTECTOMY MODIFIED RADICAL;  Surgeon: [name], MD;  Location: [facility];  Service: General;  Laterality: Left;  . PORT-A-CATH REMOVAL  03/13/2013  . PORT-A-CATH REMOVAL Left 03/13/2013   Procedure: REMOVAL PORT-A-CATH;  Surgeon: [name], MD;  Location: [facility];  Service: General;  Laterality: Left;  . PORTACATH PLACEMENT  08/15/2012   Procedure: INSERTION PORT-A-CATH;  Surgeon: [name], MD;  Location: [facility];  Service: General;  Laterality: Left;  . PORTACATH PLACEMENT N/A 01/09/2016   Procedure: INSERTION PORT-A-CATH;  Surgeon: [name], MD;  [facility];  Service: General;  Laterality: N/A;  . SKIN SPLIT GRAFT N/A 07/28/2016   Procedure: SKIN GRAFT SPLIT THICKNESS from left thigh to left chest;  Surgeon: Irene Limbo, MD;  Location: [facility];  Service: Plastics;  Laterality: N/A;  . TUBAL LIGATION  1979    There were no vitals filed for this visit.       Subjective Assessment - 08/31/16 1403    Subjective I am left handed and I can not use my left hand for anything because of the swelling. I am having to eat and brush my teeth with my right hand.    Pertinent History Ms. Schiebel noted a mass in her right breast sometime in 2011. 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 More recently her husband twisted her arm to get the mass looked at, and she brought it to Dr.  Pennie Banter attention. Diagnostic mammography and right ultrasonography at [facility] 03/02/2012 showed a 6.78 cm irregular mass in the inner aspect of the right breast. There was nipple retraction and erythema around the nipple. The largest lymph node was noted in the right axilla. By ultrasound the large irregular hypoechoic mass was noted in the breast with multiple enlarged axillary lymph nodes. Physical exam confirmed a hard breast with erythema around the right nipple extending inferiorly. The nipple is slightly inverted.  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   She underwent chemotherapy treatment for that.  2015 left adenopathy was noted; 2017 further left axillary and subpectoral adenopathy noted on scans and confirmed with biopsy.   Pt underwent chemotherapy and completed in November 2017. Pt underwent a left mastectomy on January 2nd and is unsure if there were lymph nodes removed. Pt states it was suggested that she complete radiation but pt does not plan to at this time.   HTN controlled with meds.     Patient Stated Goals for the swelling to go down   Currently in Pain? No/denies   Pain Score 0-No pain            [facility] PT Assessment - 08/31/16 0001      Assessment   Medical Diagnosis h/o right breast cancer with mastectomy and chemo, and newer left breast cancer with adenopathy with mastectomy and chemo   Referring Provider Thimmappa     Precautions   Precautions Other (comment)   Precaution Comments lymphedema     Restrictions   Weight Bearing Restrictions No     Balance Screen   Has the patient fallen in the past 6 months No   Has the patient had a decrease in activity level because of a fear of falling?  No   Is the patient reluctant to leave their home because of a fear of falling?  No     Home Ecologist residence   Living Arrangements Spouse/significant other    Type of [place] Multi-level     Prior Function   Level of [place] Retired   Leisure pt states she does not exercise     Cognition   Overall Cognitive Status Within Functional Limits for tasks assessed     Observation/Other Assessments   Skin Integrity left mastectomy wound and subsequent skin graft still healing with several areas of eschar noted   Other Surveys  --  LLIS: 54% impaired     ROM / Strength   AROM / PROM / Strength AROM;Strength     AROM   AROM Assessment Site Shoulder   Right/Left Shoulder Right;Left   Right Shoulder Flexion 120 Degrees   Right Shoulder ABduction 95 Degrees  with scaption   Left Shoulder Flexion 91 Degrees   Left Shoulder ABduction 62 Degrees  with scaption     Strength   Overall Strength Deficits   Overall Strength Comments --  2+/3-/5 throughout bilateral UEs           LYMPHEDEMA/ONCOLOGY QUESTIONNAIRE - 08/31/16 1411      Type   Cancer Type h/o right breast cancer and most recently left breast cancer     Surgeries   Mastectomy Date 07/28/16  right side in 2015   Number Lymph Nodes Removed 20  removed from right side, none on L      Treatment   Active Chemotherapy Treatment No   Past Chemotherapy Treatment Yes   Date 06/10/16   Active Radiation Treatment No   Past Radiation Treatment Yes   Date 12/25/13   Body Site right chest   Current Hormone Treatment No   Past Hormone Therapy No     What other symptoms do you have   Are you Having Heaviness or Tightness Yes   Are you having Pain Yes   Are you having pitting edema Yes   Body Site L forearm   Is it Hard or Difficult finding clothes that fit Yes   Do you have infections No   Is there Decreased scar mobility Yes     Right Upper Extremity Lymphedema   10 cm Proximal to Olecranon Process 28 cm   Olecranon Process 24 cm   10 cm Proximal to Ulnar Styloid Process 19 cm   Just Proximal to Ulnar Styloid Process 15.6 cm    Across Hand at [employer] 16.5 cm   At [place] of 2nd Digit 5.2 cm     Left Upper Extremity Lymphedema   10 cm Proximal to Olecranon Process 41.1 cm   Olecranon Process 32 cm   10 cm Proximal to Ulnar Styloid Process 29 cm   Just Proximal to Ulnar Styloid Process 21.5 cm   Across Hand at [employer] 22 cm   At [place] of 2nd Digit 7 cm                                Long Term Clinic Goals - 08/31/16 1722      CC Long Term Goal  #1   Title Patient and/or her husband will be able to perform manual lymph drainage independently   Time 4   Period Weeks   Status New     CC Long Term Goal  #2   Title Patient will obtain appropriate compression garments for long term management of lymphedema   Time 4   Period Weeks   Status New     CC Long Term Goal  #3   Title Patient will demonstrate 120 degrees of left shoulder flexion to allow pt to reach items on shelves   Baseline 91   Time 4   Period Weeks   Status New     CC Long Term Goal  #4   Title Pt will demonstrate 90 degrees of left shoulder abduction ROM to allow pt to return to prior level of function   Baseline 62   Time 4   Period Weeks   Status New     CC Long Term Goal  #5   Title Patient will be independent in a home exercise program for continued ROM and strengthening of bilateral shoulders   Time 4  Period Weeks   Status New            Plan - 2016/09/07 1711    Clinical Impression Statement Patient reports for PT today following a left mastectomy for treatment of left breast cancer with mets to axillary nodes. Per surgery report no lymph nodes were removed from this side due to increased fibrosis in area and fear of damaging nerves. Patient has completed chemotherapy. She is still healing from the left mastecotmy and required skin grafting from her thigh. She has several areas of eschar on her left chest. She also has a history of prior breast cancer of right side with a right  mastectomy and completion of chemotherapy and radiation. Pt presents with decreased bilateral shoulder ROM with left worse than right. She has decreased strength bilaterally. She has siginificant lymphedema in her left upper extremity from hand to axilla measuring over 10 cm larger than right at 10 cm prox to olecranon. Due to multiple comorbidities including previous breast cancer, metastasis, lymphedema and personal factors including decision not to proceed with radiation, and an evolving prognosis this evaluation was of moderate complexity.    Rehab Potential Fair   Clinical Impairments Affecting Rehab Potential metastatic breast cancer to left axillary nodes   PT Frequency 3x / week   PT Duration 4 weeks   PT Treatment/Interventions ADLs/Self Care Home Management;DME Instruction;Therapeutic exercise;Patient/family education;Orthotic Fit/Training;Manual techniques;Manual lymph drainage;Compression bandaging;Passive range of motion;Taping;Scar mobilization   PT Next Visit Plan Assess use of tg soft, begin MLD to LUE and begin compression bandaging from left fingers to axilla   Consulted and Agree with Plan of Care Patient      Patient will benefit from skilled therapeutic intervention in order to improve the following deficits and impairments:  Increased edema, Decreased range of motion, Decreased strength, Impaired UE functional use, Decreased knowledge of precautions, Decreased scar mobility, Postural dysfunction, Pain, Increased fascial restricitons, Decreased skin integrity  Visit Diagnosis: Postmastectomy lymphedema - Plan: PT plan of care cert/re-cert  Stiffness of left shoulder, not elsewhere classified - Plan: PT plan of care cert/re-cert  Stiffness of right shoulder, not elsewhere classified - Plan: PT plan of care cert/re-cert  Acute pain of left shoulder - Plan: PT plan of care cert/re-cert  Muscle weakness (generalized) - Plan: PT plan of care cert/re-cert      G-Codes -  09/07/2016 1724    Functional Assessment Tool Used lymphedema life impact scale   Functional Limitation Self care   Self Care Current Status [id number]) At least 40 percent but less than 60 percent impaired, limited or restricted   Self Care Goal Status [id number]) At least 1 percent but less than 20 percent impaired, limited or restricted       Problem List Patient Active Problem List   Diagnosis Date Noted  . Malignant neoplasm of overlapping sites of left breast in female, estrogen receptor negative ([place]) 08/27/2016  . Cancer related pain 08/27/2016  . Breast cancer, left breast ([place]) 07/28/2016  . SOB (shortness of breath) 07/24/2016  . Dehydration 07/24/2016  . Rash 07/17/2016  . Cellulitis 05/25/2016  . Recurrent cancer of right breast ([place]) 01/10/2016  . Lymphedema of arm 12/17/2015  . Breast mass, left 06/15/2015  . Atherosclerosis of aorta ([place]) 08/02/2014  . Breast cancer of upper-outer quadrant of right female breast ([place]) 03/31/2013  . Hypokalemia 12/13/2012  . Hypertension 07/04/2012    Allyson Sabal [facility] Sep 07, 2016, 5:26 PM  [place]  [place], [place], [address] Phone: [phone]   Fax:  [phone]  Name: Janice Powell MRN: [id number] Date of Birth: 1946-03-04   Allyson Sabal [employer], PT 08/31/16 5:26 PM

## 2016-09-01 ENCOUNTER — Ambulatory Visit: Payer: Medicare Other | Admitting: Physical Therapy

## 2016-09-01 DIAGNOSIS — I972 Postmastectomy lymphedema syndrome: Secondary | ICD-10-CM

## 2016-09-01 DIAGNOSIS — M25612 Stiffness of left shoulder, not elsewhere classified: Secondary | ICD-10-CM | POA: Diagnosis not present

## 2016-09-01 DIAGNOSIS — M25512 Pain in left shoulder: Secondary | ICD-10-CM | POA: Diagnosis not present

## 2016-09-01 DIAGNOSIS — M6281 Muscle weakness (generalized): Secondary | ICD-10-CM | POA: Diagnosis not present

## 2016-09-01 DIAGNOSIS — M25611 Stiffness of right shoulder, not elsewhere classified: Secondary | ICD-10-CM | POA: Diagnosis not present

## 2016-09-01 NOTE — Therapy (Signed)
[place], [place], [address] Phone: [phone]   Fax:  [phone]  Physical Therapy Treatment  Patient Details  Name: Janice Powell MRN: [id number] Date of Birth: 1945-12-01 Referring Provider: Thimmappa  Encounter Date: 09/01/2016      PT End of Session - 09/01/16 1721    Visit Number 2   Number of Visits 13   Date for PT Re-Evaluation 09/28/16   PT Start Time [id number]  pt. late for appt.   PT Stop Time 1608   PT Time Calculation (min) 43 min   Activity Tolerance Patient tolerated treatment well   Behavior During Therapy WFL for tasks assessed/performed      Past Medical History:  Diagnosis Date  . Breast cancer ([place]) 03/08/13   right breast - Invasive Ductal Carcinoma   . GERD (gastroesophageal reflux disease)    occ  . Hx antineoplastic chemotherapy    two cycles of neoadjuvant cyclophosphamide, docetaxel, doxorubicin ["TAC"] at standard doses with neulasta support; refused neulasta with cycles 2 and 4; tolerated treatment poorly (b) docetaxel was held for final 4 cycles; completed cycle 6 neoadjuvant chemotherapy 12/27/2012    . Hypertension   . Osteopenia   . Pneumonia    hx  . Primary malignant neoplasm of left breast with stage 2 nodal metastasis per American Joint Committee on Cancer 7th edition (N2) ([place]) 01/09/2016  . S/P radiation therapy    1) Right Chest Wall and IM nodes / 50 Gy in 25 fractions /2) Right Supraclavicular fossa / 50 Gy in 25 fractions/1) Right Chest Wall and IM nodes / 50 Gy in 25 fractions /2) Right Supraclavicular fossa / 50 Gy in 25 fractions/3) Right Posterior Axillary boost / 6.55 Gy in 25 fractions/4) Right Chest Wall Scar boost / 10 Gy in 5 fractions      . Wears glasses     Past Surgical History:  Procedure Laterality Date  . APPENDECTOMY  1992  . APPLICATION OF A-CELL OF EXTREMITY Left 07/28/2016   Procedure: APPLICATION OF A-CELL OF TO LEFT THIGH;   Surgeon: Irene Limbo, MD;  Location: [facility];  Service: Plastics;  Laterality: Left;  . APPLICATION OF WOUND VAC Left 07/28/2016   Procedure: APPLICATION OF WOUND VAC, left breast;  Surgeon: Irene Limbo, MD;  Location: [facility];  Service: Plastics;  Laterality: Left;  . BREAST BIOPSY Right   . CHOLECYSTECTOMY  2000  . MASTECTOMY Right 03/13/2013  . MASTECTOMY MODIFIED RADICAL Right 03/13/2013   Procedure: MASTECTOMY MODIFIED RADICAL;  Surgeon: [name], MD;  Location: [facility];  Service: General;  Laterality: Right;  . MASTECTOMY MODIFIED RADICAL Left 07/28/2016   Procedure: MASTECTOMY MODIFIED RADICAL;  Surgeon: [name], MD;  Location: [facility];  Service: General;  Laterality: Left;  . PORT-A-CATH REMOVAL  03/13/2013  . PORT-A-CATH REMOVAL Left 03/13/2013   Procedure: REMOVAL PORT-A-CATH;  Surgeon: [name], MD;  Location: [facility];  Service: General;  Laterality: Left;  . PORTACATH PLACEMENT  08/15/2012   Procedure: INSERTION PORT-A-CATH;  Surgeon: [name], MD;  Location: [facility];  Service: General;  Laterality: Left;  . PORTACATH PLACEMENT N/A 01/09/2016   Procedure: INSERTION PORT-A-CATH;  Surgeon: [name], MD;  [facility];  Service: General;  Laterality: N/A;  . SKIN SPLIT GRAFT N/A 07/28/2016   Procedure: SKIN GRAFT SPLIT THICKNESS from left thigh to left chest;  Surgeon: Irene Limbo, MD;  Location: [place] [facility];  Service: Plastics;  Laterality: N/A;  .  TUBAL LIGATION  1979    There were no vitals filed for this visit.      Subjective Assessment - 09/01/16 1527    Subjective Did okay wearing the TG soft on the left arm.  Says her left fingers are still numb. Wants to ask the doctor if she can go on Motrin instead of Percocet, which she feels is too strong. She says about 20-some lymph nodes were removed on the right side.   Currently in Pain? Yes   Pain Score 7    Pain Location Hand   Pain Orientation Left   Aggravating Factors  maybe being cold   Pain  Relieving Factors running warm water on it            [facility] PT Assessment - 09/01/16 0001      Observation/Other Assessments   Observations TG soft left an indentation at upper arm where the top had rolled down a little           LYMPHEDEMA/ONCOLOGY QUESTIONNAIRE - 08/31/16 1411      Type   Cancer Type h/o right breast cancer and most recently left breast cancer     Surgeries   Mastectomy Date 07/28/16  right side in 2015   Number Lymph Nodes Removed 20  removed from right side, none on L      Treatment   Active Chemotherapy Treatment No   Past Chemotherapy Treatment Yes   Date 06/10/16   Active Radiation Treatment No   Past Radiation Treatment Yes   Date 12/25/13   Body Site right chest   Current Hormone Treatment No   Past Hormone Therapy No     What other symptoms do you have   Are you Having Heaviness or Tightness Yes   Are you having Pain Yes   Are you having pitting edema Yes   Body Site L forearm   Is it Hard or Difficult finding clothes that fit Yes   Do you have infections No   Is there Decreased scar mobility Yes     Right Upper Extremity Lymphedema   10 cm Proximal to Olecranon Process 28 cm   Olecranon Process 24 cm   10 cm Proximal to Ulnar Styloid Process 19 cm   Just Proximal to Ulnar Styloid Process 15.6 cm   Across Hand at [employer] 16.5 cm   At [place] of 2nd Digit 5.2 cm     Left Upper Extremity Lymphedema   10 cm Proximal to Olecranon Process 41.1 cm   Olecranon Process 32 cm   10 cm Proximal to Ulnar Styloid Process 29 cm   Just Proximal to Ulnar Styloid Process 21.5 cm   Across Hand at [employer] 22 cm   At [place] of 2nd Digit 7 cm                  [facility] Adult PT Treatment/Exercise - 09/01/16 0001      Self-Care   Self-Care Other Self-Care Comments   Other Self-Care Comments  Brief instruction in diaphragmatic breathing.     Manual Therapy   Manual Therapy Edema management;Manual Lymphatic Drainage  (MLD);Compression Bandaging   Manual Lymphatic Drainage (MLD) In supine, 5 deep breaths, then short neck, left groin and axillo-inguinal anastomosis, and left UE working proximal to distal and then retracing steps.   Compression Bandaging To left UE:  stockinette, Elastomull on all five fingers, Artiflex x 1, and 1-6 cm., 1-8 cm., and 1-12 cm. short stretch  bandage from hand to shoulder.                PT Education - 09/01/16 1720    Education provided Yes   Education Details left UE remedial lymphedema exercises; to try these in case of pain or paresthesias, but to remove bandages if no relief   Person(s) Educated Patient   Methods Explanation;Verbal cues;Handout;Demonstration   Comprehension Verbalized understanding;Returned demonstration                [facility] Goals - 2016-09-08 1722      CC Long Term Goal  #1   Title Patient and/or her husband will be able to perform manual lymph drainage independently   Time 4   Period Weeks   Status New     CC Long Term Goal  #2   Title Patient will obtain appropriate compression garments for long term management of lymphedema   Time 4   Period Weeks   Status New     CC Long Term Goal  #3   Title Patient will demonstrate 120 degrees of left shoulder flexion to allow pt to reach items on shelves   Baseline 91   Time 4   Period Weeks   Status New     CC Long Term Goal  #4   Title Pt will demonstrate 90 degrees of left shoulder abduction ROM to allow pt to return to prior level of function   Baseline 62   Time 4   Period Weeks   Status New     CC Long Term Goal  #5   Title Patient will be independent in a home exercise program for continued ROM and strengthening of bilateral shoulders   Time 4   Period Weeks   Status New            Plan - 09/01/16 1722    Clinical Impression Statement Patient late for appointment today, but complete decongestive therapy was begun.  She does have discomfort in the left  arm and asked if this treatment would help.  She holds the left arm with elbow bent and close to her side; I talked to her today about needing to straighten out and move the arm to avoid potential contracture.  She moved her arm well in the bandages today.   Rehab Potential Fair   Clinical Impairments Affecting Rehab Potential metastatic breast cancer to left axillary nodes   PT Frequency 3x / week   PT Duration 4 weeks   PT Treatment/Interventions ADLs/Self Care Home Management;DME Instruction;Therapeutic exercise;Patient/family education;Orthotic Fit/Training;Manual techniques;Manual lymph drainage;Compression bandaging;Passive range of motion;Taping;Scar mobilization   PT Next Visit Plan Pt. to return in two days.  Assess tolerance for bandages and benefit of bandages; continue complete decongestive therapy.   PT Home Exercise Plan remedial UE lymphedema exercises   Consulted and Agree with Plan of Care Patient      Patient will benefit from skilled therapeutic intervention in order to improve the following deficits and impairments:  Increased edema, Decreased range of motion, Decreased strength, Impaired UE functional use, Decreased knowledge of precautions, Decreased scar mobility, Postural dysfunction, Pain, Increased fascial restricitons, Decreased skin integrity  Visit Diagnosis: Postmastectomy lymphedema  Stiffness of left shoulder, not elsewhere classified       G-Codes - September 08, 2016 1724    Functional Assessment Tool Used lymphedema life impact scale   Functional Limitation Self care   Self Care Current Status [id number]) At least 40 percent but less than 60  percent impaired, limited or restricted   Self Care Goal Status [id number]) At least 1 percent but less than 20 percent impaired, limited or restricted      Problem List Patient Active Problem List   Diagnosis Date Noted  . Malignant neoplasm of overlapping sites of left breast in female, estrogen receptor negative ([place])  08/27/2016  . Cancer related pain 08/27/2016  . Breast cancer, left breast ([place]) 07/28/2016  . SOB (shortness of breath) 07/24/2016  . Dehydration 07/24/2016  . Rash 07/17/2016  . Cellulitis 05/25/2016  . Recurrent cancer of right breast ([place]) 01/10/2016  . Lymphedema of arm 12/17/2015  . Breast mass, left 06/15/2015  . Atherosclerosis of aorta ([place]) 08/02/2014  . Breast cancer of upper-outer quadrant of right female breast ([place]) 03/31/2013  . Hypokalemia 12/13/2012  . Hypertension 07/04/2012    Amanat Hackel 09/01/2016, 5:26 PM  [place], [place], [address] Phone: [phone]   Fax:  [phone]  Name: Janice Powell MRN: [id number] Date of Birth: 1945-10-29  Serafina Royals, PT 09/01/16 5:26 PM

## 2016-09-01 NOTE — Patient Instructions (Signed)
Neck Side-Bending   Begin with chin level, head centered over spine. Slowly lower one ear toward shoulder keeping shoulder down. Hold __3__ seconds. Slowly return to starting position. Repeat to other side.  Repeat __5-10__ times to each side. Do 2-3 times a day. Also stretch by looking over right shoulder, then left.  Scapular Retraction (Standing)   With arms at sides, pinch shoulder blades together.  Hold 3 seconds. Repeat __5-10__ times per set.  Do __2-3__ sessions per day.   Active ROM Flexion    Raise arm to point to ceiling, keeping elbow straight. Hold __3__ seconds. Repeat _5-10___ times. Do _2-3___ sessions per day. Activity: Use this motion to reach for items on overhead shelves.     Flexion (Active)   Palm up, rest elbow on other hand. Bend as far as possible. Hold __3__ seconds. Repeat __5-10__ times. Do _2-3___ sessions per day. Can use other hand to bend elbow further if needed where bandage may limit end motion.  Copyright  VHI. All rights reserved.    Extension (Active With Finger Extension)   Llift hand with fingers straight, then bend wrist down.  Hold _3___ seconds each way. Repeat __5-10__ times. Do _2-3___ sessions per day.    AROM: Forearm Pronation / Supination   With right arm in handshake position, slowly rotate palm down. Relax. Then rotate palm up. Repeat __5-10__ times per set. Do __2-3__ sessions per day.  Copyright  VHI. All rights reserved.    AROM: Finger Flexion / Extension   Actively bend fingers of right hand. Start with knuckles furthest from palm, and slowly make a fist. Hold __3__ seconds. Relax. Then straighten fingers as far as possible. Repeat _5-10___ times per set.  Do __2-3__ sessions per day. Also spread fingers as far as able, then bring them back together.  Copyright  VHI. All rights reserved.   If any pain/tingling symptoms occur, try all of these exercises first to promote circulation. If symptoms do not  ease off, then remove bandages and bring all wrappings back to next appointment.    

## 2016-09-03 ENCOUNTER — Ambulatory Visit: Payer: Medicare Other | Admitting: Physical Therapy

## 2016-09-08 ENCOUNTER — Ambulatory Visit: Payer: Medicare Other | Admitting: Physical Therapy

## 2016-09-08 ENCOUNTER — Encounter: Payer: Self-pay | Admitting: Physical Therapy

## 2016-09-08 DIAGNOSIS — M25612 Stiffness of left shoulder, not elsewhere classified: Secondary | ICD-10-CM | POA: Diagnosis not present

## 2016-09-08 DIAGNOSIS — M6281 Muscle weakness (generalized): Secondary | ICD-10-CM | POA: Diagnosis not present

## 2016-09-08 DIAGNOSIS — I972 Postmastectomy lymphedema syndrome: Secondary | ICD-10-CM | POA: Diagnosis not present

## 2016-09-08 DIAGNOSIS — M25512 Pain in left shoulder: Secondary | ICD-10-CM | POA: Diagnosis not present

## 2016-09-08 DIAGNOSIS — M25611 Stiffness of right shoulder, not elsewhere classified: Secondary | ICD-10-CM | POA: Diagnosis not present

## 2016-09-08 NOTE — Therapy (Signed)
[place], [place], [address] Phone: [phone]   Fax:  [phone]  Physical Therapy Treatment  Patient Details  Name: Janice Powell MRN: [id number] Date of Birth: [birth year] Referring Provider: Thimmappa  Encounter Date: 09/08/2016      PT End of Session - 09/08/16 1201    Visit Number 3   Number of Visits 13   Date for PT Re-Evaluation 09/28/16   PT Start Time 1105   PT Stop Time 1150   PT Time Calculation (min) 45 min   Activity Tolerance Patient tolerated treatment well   Behavior During Therapy WFL for tasks assessed/performed      Past Medical History:  Diagnosis Date  . Breast cancer ([place]) 03/08/13   right breast - Invasive Ductal Carcinoma   . GERD (gastroesophageal reflux disease)    occ  . Hx antineoplastic chemotherapy    two cycles of neoadjuvant cyclophosphamide, docetaxel, doxorubicin ["TAC"] at standard doses with neulasta support; refused neulasta with cycles 2 and 4; tolerated treatment poorly (b) docetaxel was held for final 4 cycles; completed cycle 6 neoadjuvant chemotherapy 12/27/2012    . Hypertension   . Osteopenia   . Pneumonia    hx  . Primary malignant neoplasm of left breast with stage 2 nodal metastasis per American Joint Committee on Cancer 7th edition (N2) ([place]) 01/09/2016  . S/P radiation therapy    1) Right Chest Wall and IM nodes / 50 Gy in 25 fractions /2) Right Supraclavicular fossa / 50 Gy in 25 fractions/1) Right Chest Wall and IM nodes / 50 Gy in 25 fractions /2) Right Supraclavicular fossa / 50 Gy in 25 fractions/3) Right Posterior Axillary boost / 6.55 Gy in 25 fractions/4) Right Chest Wall Scar boost / 10 Gy in 5 fractions      . Wears glasses     Past Surgical History:  Procedure Laterality Date  . APPENDECTOMY  1992  . APPLICATION OF A-CELL OF EXTREMITY Left 07/28/2016   Procedure: APPLICATION OF A-CELL OF TO LEFT THIGH;  Surgeon: Irene Limbo,  MD;  Location: [place];  Service: Plastics;  Laterality: Left;  . APPLICATION OF WOUND VAC Left 07/28/2016   Procedure: APPLICATION OF WOUND VAC, left breast;  Surgeon: Irene Limbo, MD;  Location: [place];  Service: Plastics;  Laterality: Left;  . BREAST BIOPSY Right   . CHOLECYSTECTOMY  2000  . MASTECTOMY Right 03/13/2013  . MASTECTOMY MODIFIED RADICAL Right 03/13/2013   Procedure: MASTECTOMY MODIFIED RADICAL;  Surgeon: [name], MD;  Location: [place];  Service: General;  Laterality: Right;  . MASTECTOMY MODIFIED RADICAL Left 07/28/2016   Procedure: MASTECTOMY MODIFIED RADICAL;  Surgeon: [name], MD;  Location: [place];  Service: General;  Laterality: Left;  . PORT-A-CATH REMOVAL  03/13/2013  . PORT-A-CATH REMOVAL Left 03/13/2013   Procedure: REMOVAL PORT-A-CATH;  Surgeon: [name], MD;  Location: [place] [facility];  Service: General;  Laterality: Left;  . PORTACATH PLACEMENT  08/15/2012   Procedure: INSERTION PORT-A-CATH;  Surgeon: [name], MD;  Location: [place];  Service: General;  Laterality: Left;  . PORTACATH PLACEMENT N/A 01/09/2016   Procedure: INSERTION PORT-A-CATH;  Surgeon: [name], MD;  Location[facility];  Service: General;  Laterality: N/A;  . SKIN SPLIT GRAFT N/A 07/28/2016   Procedure: SKIN GRAFT SPLIT THICKNESS from left thigh to left chest;  Surgeon: Irene Limbo, MD;  Location: [place];  Service: Plastics;  Laterality: N/A;  . TUBAL LIGATION  1979    There were no vitals filed for this visit.      Subjective Assessment - 09/08/16 1107    Subjective My arm is okay. It hurts every now and then. My arm looked like it came down some. I was able to leave the bandages on one day.    Pertinent History Ms. Dunstan noted a mass in her right breast sometime in 2011. 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 More recently her husband twisted her arm to get the mass looked at, and she brought it to Dr. Pennie Banter attention.  Diagnostic mammography and right ultrasonography at [facility] 03/02/2012 showed a 6.78 cm irregular mass in the inner aspect of the right breast. There was nipple retraction and erythema around the nipple. The largest lymph node was noted in the right axilla. By ultrasound the large irregular hypoechoic mass was noted in the breast with multiple enlarged axillary lymph nodes. Physical exam confirmed a hard breast with erythema around the right nipple extending inferiorly. The nipple is slightly inverted.  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   She underwent chemotherapy treatment for that.  2015 left adenopathy was noted; 2017 further left axillary and subpectoral adenopathy noted on scans and confirmed with biopsy.   Pt underwent chemotherapy and completed in November 2017. Pt underwent a left mastectomy on January 2nd and is unsure if there were lymph nodes removed. Pt states it was suggested that she complete radiation but pt does not plan to at this time.   HTN controlled with meds.     Patient Stated Goals for the swelling to go down   Currently in Pain? No/denies   Pain Score 0-No pain                         [facility] Adult PT Treatment/Exercise - 09/08/16 0001      Manual Therapy   Manual Therapy Edema management;Manual Lymphatic Drainage (MLD);Compression Bandaging   Manual Lymphatic Drainage (MLD) In supine, 5 deep breaths, then short neck, left groin and axillo-inguinal anastomosis, and left UE working proximal to distal and then retracing steps.   Compression Bandaging To left UE:  stockinette, Elastomull on all five fingers, Artiflex x 1, and 1-6 cm., 1-8 cm., and 1-12 cm. short stretch bandage from hand to shoulder.                        [facility] Goals - 08/31/16 1722      CC Long Term Goal  #1   Title Patient and/or her husband will be able to perform manual  lymph drainage independently   Time 4   Period Weeks   Status New     CC Long Term Goal  #2   Title Patient will obtain appropriate compression garments for long term management of lymphedema   Time 4   Period Weeks   Status New     CC Long Term Goal  #3   Title Patient will demonstrate 120 degrees of left shoulder flexion to allow pt to reach items on shelves   Baseline 91   Time 4   Period Weeks   Status New     CC Long Term Goal  #4   Title Pt will demonstrate 90 degrees of left shoulder abduction ROM to allow  pt to return to prior level of function   Baseline 62   Time 4   Period Weeks   Status New     CC Long Term Goal  #5   Title Patient will be independent in a home exercise program for continued ROM and strengthening of bilateral shoulders   Time 4   Period Weeks   Status New            Plan - 09/08/16 1159    Clinical Impression Statement Manual lymphatic drainage performed to LUE today followed by compression bandaging. Patient with increased fibrosis of LUE. She was only able to leave bandages in place for 1 day. She states they felt too tight. Pt requested fingers and hand be wrapped more loosely this visit.    Rehab Potential Fair   Clinical Impairments Affecting Rehab Potential metastatic breast cancer to left axillary nodes   PT Frequency 3x / week   PT Duration 4 weeks   PT Treatment/Interventions ADLs/Self Care Home Management;DME Instruction;Therapeutic exercise;Patient/family education;Orthotic Fit/Training;Manual techniques;Manual lymph drainage;Compression bandaging;Passive range of motion;Taping;Scar mobilization   PT Next Visit Plan assess how pt tolerated bandages since last session, continue complete decongestive therapy.   PT Home Exercise Plan remedial UE lymphedema exercises   Consulted and Agree with Plan of Care Patient      Patient will benefit from skilled therapeutic intervention in order to improve the following deficits and  impairments:  Increased edema, Decreased range of motion, Decreased strength, Impaired UE functional use, Decreased knowledge of precautions, Decreased scar mobility, Postural dysfunction, Pain, Increased fascial restricitons, Decreased skin integrity  Visit Diagnosis: Postmastectomy lymphedema     Problem List Patient Active Problem List   Diagnosis Date Noted  . Malignant neoplasm of overlapping sites of left breast in female, estrogen receptor negative ([place]) 08/27/2016  . Cancer related pain 08/27/2016  . Breast cancer, left breast ([place]) 07/28/2016  . SOB (shortness of breath) 07/24/2016  . Dehydration 07/24/2016  . Rash 07/17/2016  . Cellulitis 05/25/2016  . Recurrent cancer of right breast ([place]) 01/10/2016  . Lymphedema of arm 12/17/2015  . Breast mass, left 06/15/2015  . Atherosclerosis of aorta ([place]) 08/02/2014  . Breast cancer of upper-outer quadrant of right female breast ([place]) 03/31/2013  . Hypokalemia 12/13/2012  . Hypertension 07/04/2012    Allyson Sabal [facility] 09/08/2016, 12:02 PM  [place], [place], [address] Phone: [phone]   Fax:  [phone]  Name: Janice Powell MRN: [id number] Date of Birth: 01-May-1946  Manus Gunning, PT 09/08/16 12:02 PM

## 2016-09-10 ENCOUNTER — Ambulatory Visit: Payer: Medicare Other

## 2016-09-10 DIAGNOSIS — I972 Postmastectomy lymphedema syndrome: Secondary | ICD-10-CM | POA: Diagnosis not present

## 2016-09-10 DIAGNOSIS — M25512 Pain in left shoulder: Secondary | ICD-10-CM | POA: Diagnosis not present

## 2016-09-10 DIAGNOSIS — M6281 Muscle weakness (generalized): Secondary | ICD-10-CM | POA: Diagnosis not present

## 2016-09-10 DIAGNOSIS — M25612 Stiffness of left shoulder, not elsewhere classified: Secondary | ICD-10-CM | POA: Diagnosis not present

## 2016-09-10 DIAGNOSIS — M25611 Stiffness of right shoulder, not elsewhere classified: Secondary | ICD-10-CM | POA: Diagnosis not present

## 2016-09-10 NOTE — Therapy (Signed)
[place], [place], [address] Phone: [phone]   Fax:  [phone]  Physical Therapy Treatment  Patient Details  Name: Janice Powell MRN: [id number] Date of Birth: 04-06-1946 Referring Provider: Thimmappa  Encounter Date: 09/10/2016      PT End of Session - 09/10/16 1608    Visit Number 4   Number of Visits 13   Date for PT Re-Evaluation 09/28/16   PT Start Time [id number]   PT Stop Time 1602   PT Time Calculation (min) 41 min   Activity Tolerance Patient tolerated treatment well   Behavior During Therapy [facility] for tasks assessed/performed      Past Medical History:  Diagnosis Date  . Breast cancer ([place]) 03/08/13   right breast - Invasive Ductal Carcinoma   . GERD (gastroesophageal reflux disease)    occ  . Hx antineoplastic chemotherapy    two cycles of neoadjuvant cyclophosphamide, docetaxel, doxorubicin ["TAC"] at standard doses with neulasta support; refused neulasta with cycles 2 and 4; tolerated treatment poorly (b) docetaxel was held for final 4 cycles; completed cycle 6 neoadjuvant chemotherapy 12/27/2012    . Hypertension   . Osteopenia   . Pneumonia    hx  . 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 S/P radiation therapy    1) Right Chest Wall and IM nodes / 50 Gy in 25 fractions /2) Right Supraclavicular fossa / 50 Gy in 25 fractions/1) Right Chest Wall and IM nodes / 50 Gy in 25 fractions /2) Right Supraclavicular fossa / 50 Gy in 25 fractions/3) Right Posterior Axillary boost / 6.55 Gy in 25 fractions/4) Right Chest Wall Scar boost / 10 Gy in 5 fractions      . Wears glasses     Past Surgical History:  Procedure Laterality Date  . APPENDECTOMY  1992  . APPLICATION OF A-CELL OF EXTREMITY Left 07/28/2016   Procedure: APPLICATION OF A-CELL OF TO LEFT THIGH;  Surgeon: Irene Limbo,  MD;  Location: [place];  Service: Plastics;  Laterality: Left;  . APPLICATION OF WOUND VAC Left 07/28/2016   Procedure: APPLICATION OF WOUND VAC, left breast;  Surgeon: Irene Limbo, MD;  Location: [place];  Service: Plastics;  Laterality: Left;  . BREAST BIOPSY Right   . CHOLECYSTECTOMY  2000  . MASTECTOMY Right 03/13/2013  . MASTECTOMY MODIFIED RADICAL Right 03/13/2013   Procedure: MASTECTOMY MODIFIED RADICAL;  Surgeon: [name], MD;  Location: [place];  Service: General;  Laterality: Right;  . MASTECTOMY MODIFIED RADICAL Left 07/28/2016   Procedure: MASTECTOMY MODIFIED RADICAL;  Surgeon: [name], MD;  Location: [place];  Service: General;  Laterality: Left;  . PORT-A-CATH REMOVAL  03/13/2013  . PORT-A-CATH REMOVAL Left 03/13/2013   Procedure: REMOVAL PORT-A-CATH;  Surgeon: [name], MD;  Location: [place];  Service: General;  Laterality: Left;  . PORTACATH PLACEMENT  08/15/2012   Procedure: INSERTION PORT-A-CATH;  Surgeon: [name], MD;  Location: [place];  Service: General;  Laterality: Left;  . PORTACATH PLACEMENT N/A 01/09/2016   Procedure: INSERTION PORT-A-CATH;  Surgeon: [name], MD;  [facility];  Service: General;  Laterality: N/A;  . SKIN SPLIT GRAFT N/A 07/28/2016   Procedure: SKIN GRAFT SPLIT THICKNESS from left thigh to left chest;  Surgeon: Irene Limbo, MD;  Location: [place];  Service: Plastics;  Laterality: N/A;  . TUBAL LIGATION  1979    There were no vitals filed for this visit.      Subjective Assessment - 09/10/16 1522    Subjective My bandages felt better this time and they stayed on until about mid afternoon yesterday.    Pertinent History Ms. Lawing noted a mass in her right breast sometime in 2011. 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 More recently her husband twisted her arm to get the mass looked at, and she brought it to Dr. Pennie Banter attention. Diagnostic mammography and right  ultrasonography at [facility] 03/02/2012 showed a 6.78 cm irregular mass in the inner aspect of the right breast. There was nipple retraction and erythema around the nipple. The largest lymph node was noted in the right axilla. By ultrasound the large irregular hypoechoic mass was noted in the breast with multiple enlarged axillary lymph nodes. Physical exam confirmed a hard breast with erythema around the right nipple extending inferiorly. The nipple is slightly inverted.  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   She underwent chemotherapy treatment for that.  2015 left adenopathy was noted; 2017 further left axillary and subpectoral adenopathy noted on scans and confirmed with biopsy.   Pt underwent chemotherapy and completed in November 2017. Pt underwent a left mastectomy on January 2nd and is unsure if there were lymph nodes removed. Pt states it was suggested that she complete radiation but pt does not plan to at this time.   HTN controlled with meds.     Patient Stated Goals for the swelling to go down   Currently in Pain? Yes   Pain Score 6    Pain Location Arm  forearm   Pain Orientation Left   Pain Descriptors / Indicators Numbness   Pain Type Chronic pain   Aggravating Factors  increased swelling   Pain Relieving Factors pain meds help some and bandages               LYMPHEDEMA/ONCOLOGY QUESTIONNAIRE - 09/10/16 1525      Left Upper Extremity Lymphedema   10 cm Proximal to Olecranon Process 39.4 cm   Olecranon Process 32.9 cm   10 cm Proximal to Ulnar Styloid Process 28.5 cm   Just Proximal to Ulnar Styloid Process 21.6 cm   Across Hand at [employer] 20.6 cm   At [place] of 2nd Digit 6.3 cm                  [facility] Adult PT Treatment/Exercise - 09/10/16 0001      Manual Therapy   Manual Therapy Manual Lymphatic Drainage (MLD);Compression Bandaging   Manual therapy comments  Circumference measurements taken   Manual Lymphatic Drainage (MLD) In supine, 5 diaphragmatic breaths, then short neck, left groin and axillo-inguinal anastomosis, and left UE working proximal to distal and then retracing steps.   Compression Bandaging To left UE:  Biotone lotion applied, thick stockinette, Elastomull on all five fingers, Artiflex x 1, and 1-6 cm., 1-8 cm., and 1-12 cm. short stretch bandage from hand to shoulder trying to wrap as loose as able to help increase pts tolerance of wear of bandages.                        [facility] Goals - 08/31/16 1722      CC Long Term Goal  #1  Title Patient and/or her husband will be able to perform manual lymph drainage independently   Time 4   Period Weeks   Status New     CC Long Term Goal  #2   Title Patient will obtain appropriate compression garments for long term management of lymphedema   Time 4   Period Weeks   Status New     CC Long Term Goal  #3   Title Patient will demonstrate 120 degrees of left shoulder flexion to allow pt to reach items on shelves   Baseline 91   Time 4   Period Weeks   Status New     CC Long Term Goal  #4   Title Pt will demonstrate 90 degrees of left shoulder abduction ROM to allow pt to return to prior level of function   Baseline 62   Time 4   Period Weeks   Status New     CC Long Term Goal  #5   Title Patient will be independent in a home exercise program for continued ROM and strengthening of bilateral shoulders   Time 4   Period Weeks   Status New            Plan - 09/10/16 1609    Clinical Impression Statement Continued with same treatment as last visit as pt tolerated this well. She was able to wear bandages a few hours longer than last time. She reports her arm looked really good yesterday when removed. Her circumferences measurements were taken todat and she had some reductions at her finger/hand, forearm and upper arm, but her elbow was increased.  Discussed with pt trying to decrease elbow staying in flexion as she seems comfortable to keeop it during session today and asked if she could sleep with it extended on pillow if comfortable to improve lymphatic flow through elbow area. She agreed to trying this. She was reporting some tingling in her fingers after elastomull was applied (loosely) but this decreased with opening/closing fist and with elbow flexion/extension. She reported bandages feeling good upon end of session.    Rehab Potential Fair   Clinical Impairments Affecting Rehab Potential metastatic breast cancer to left axillary nodes   PT Frequency 3x / week   PT Duration 4 weeks   PT Treatment/Interventions ADLs/Self Care Home Management;DME Instruction;Therapeutic exercise;Patient/family education;Orthotic Fit/Training;Manual techniques;Manual lymph drainage;Compression bandaging;Passive range of motion;Taping;Scar mobilization   PT Next Visit Plan assess how pt tolerated bandages since last session, continue complete decongestive therapy; instruct husband in manual lymph drainage when able. May need to consider looking into garments sooner rather than later if pt continues to not tolerate bandages well due to her metastatic disease affecting her sudden onset of Lt UE lymphedema.   Consulted and Agree with Plan of Care Patient      Patient will benefit from skilled therapeutic intervention in order to improve the following deficits and impairments:  Increased edema, Decreased range of motion, Decreased strength, Impaired UE functional use, Decreased knowledge of precautions, Decreased scar mobility, Postural dysfunction, Pain, Increased fascial restricitons, Decreased skin integrity  Visit Diagnosis: Postmastectomy lymphedema     Problem List Patient Active Problem List   Diagnosis Date Noted  . Malignant neoplasm of overlapping sites of left breast in female, estrogen receptor negative ([place]) 08/27/2016  . Cancer related pain  08/27/2016  . Breast cancer, left breast ([place]) 07/28/2016  . SOB (shortness of breath) 07/24/2016  . Dehydration 07/24/2016  . Rash 07/17/2016  . Cellulitis 05/25/2016  .  Recurrent cancer of right breast ([place]) 01/10/2016  . Lymphedema of arm 12/17/2015  . Breast mass, left 06/15/2015  . Atherosclerosis of aorta ([place]) 08/02/2014  . Breast cancer of upper-outer quadrant of right female breast ([place]) 03/31/2013  . Hypokalemia 12/13/2012  . Hypertension 07/04/2012    Otelia Limes, PTA 09/10/2016, 4:23 PM  [place], [place], [address] Phone: [phone]   Fax:  [phone]  Name: Janice Powell MRN: [id number] Date of Birth: 1945-08-27

## 2016-09-11 ENCOUNTER — Other Ambulatory Visit (HOSPITAL_BASED_OUTPATIENT_CLINIC_OR_DEPARTMENT_OTHER): Payer: Medicare Other

## 2016-09-11 ENCOUNTER — Ambulatory Visit (HOSPITAL_BASED_OUTPATIENT_CLINIC_OR_DEPARTMENT_OTHER): Payer: Medicare Other | Admitting: Oncology

## 2016-09-11 VITALS — BP 124/55 | HR 98 | Temp 98.2°F | Resp 17 | Ht 63.5 in | Wt 124.6 lb

## 2016-09-11 DIAGNOSIS — C50812 Malignant neoplasm of overlapping sites of left female breast: Secondary | ICD-10-CM

## 2016-09-11 DIAGNOSIS — C50911 Malignant neoplasm of unspecified site of right female breast: Secondary | ICD-10-CM

## 2016-09-11 DIAGNOSIS — C773 Secondary and unspecified malignant neoplasm of axilla and upper limb lymph nodes: Secondary | ICD-10-CM | POA: Diagnosis not present

## 2016-09-11 DIAGNOSIS — Z171 Estrogen receptor negative status [ER-]: Secondary | ICD-10-CM

## 2016-09-11 DIAGNOSIS — C50411 Malignant neoplasm of upper-outer quadrant of right female breast: Secondary | ICD-10-CM | POA: Diagnosis not present

## 2016-09-11 DIAGNOSIS — G893 Neoplasm related pain (acute) (chronic): Secondary | ICD-10-CM | POA: Diagnosis not present

## 2016-09-11 DIAGNOSIS — I890 Lymphedema, not elsewhere classified: Secondary | ICD-10-CM

## 2016-09-11 LAB — COMPREHENSIVE METABOLIC PANEL
ALK PHOS: 52 U/L (ref 40–150)
ALT: 7 U/L (ref 0–55)
ANION GAP: 10 meq/L (ref 3–11)
AST: 13 U/L (ref 5–34)
Albumin: 3.3 g/dL — ABNORMAL LOW (ref 3.5–5.0)
BILIRUBIN TOTAL: 0.38 mg/dL (ref 0.20–1.20)
BUN: 26 mg/dL (ref 7.0–26.0)
CALCIUM: 9.5 mg/dL (ref 8.4–10.4)
CO2: 26 mEq/L (ref 22–29)
CREATININE: 1.7 mg/dL — AB (ref 0.6–1.1)
Chloride: 96 mEq/L — ABNORMAL LOW (ref 98–109)
EGFR: 34 mL/min/{1.73_m2} — AB (ref >90)
Glucose: 107 mg/dl (ref 70–140)
Potassium: 4.2 mEq/L (ref 3.5–5.1)
Sodium: 132 mEq/L — ABNORMAL LOW (ref 136–145)
TOTAL PROTEIN: 7.3 g/dL (ref 6.4–8.3)

## 2016-09-11 LAB — CBC WITH DIFFERENTIAL/PLATELET
BASO%: 0.4 % (ref 0.0–2.0)
Basophils Absolute: 0 10*3/uL (ref 0.0–0.1)
EOS ABS: 0 10*3/uL (ref 0.0–0.5)
EOS%: 0.2 % (ref 0.0–7.0)
HEMATOCRIT: 31.6 % — AB (ref 34.8–46.6)
HGB: 10.4 g/dL — ABNORMAL LOW (ref 11.6–15.9)
LYMPH#: 0.8 10*3/uL — AB (ref 0.9–3.3)
LYMPH%: 14.7 % (ref 14.0–49.7)
MCH: 33.7 pg (ref 25.1–34.0)
MCHC: 32.9 g/dL (ref 31.5–36.0)
MCV: 102.6 fL — AB (ref 79.5–101.0)
MONO#: 0.8 10*3/uL (ref 0.1–0.9)
MONO%: 14.1 % — ABNORMAL HIGH (ref 0.0–14.0)
NEUT%: 70.6 % (ref 38.4–76.8)
NEUTROS ABS: 3.8 10*3/uL (ref 1.5–6.5)
PLATELETS: 295 10*3/uL (ref 145–400)
RBC: 3.08 10*6/uL — AB (ref 3.70–5.45)
RDW: 13.5 % (ref 11.2–14.5)
WBC: 5.4 10*3/uL (ref 3.9–10.3)

## 2016-09-11 MED ORDER — OXYCODONE-ACETAMINOPHEN 5-325 MG PO TABS: 1.0000 | ORAL_TABLET | Freq: Three times a day (TID) | ORAL | 0 refills | Status: DC | PRN

## 2016-09-11 MED ORDER — OXYCODONE HCL ER 40 MG PO T12A: 40.0000 mg | EXTENDED_RELEASE_TABLET | Freq: Two times a day (BID) | ORAL | 0 refills | Status: DC

## 2016-09-11 MED ORDER — IBUPROFEN 600 MG PO TABS: 600.0000 mg | ORAL_TABLET | Freq: Four times a day (QID) | ORAL | 3 refills | Status: AC | PRN

## 2016-09-11 NOTE — Progress Notes (Signed)
Patient ID: Janice Powell, female   DOB: 1945-11-01, 71 y.o.   MRN: [id number] ID: Janice Powell   DOB: Sep 12, 1945  MR#: [id number]  [id number]  PCP: Horatio Pel, MD GYN:  SUFanny Skates MD OTHER MD: Eppie Gibson, MD  CHIEF COMPLAINT:  Hx of Triple Negative Right Breast Cancer; triple negative left axillary adenopathy  CURRENT TREATMENT: observaton  INTERVAL HISTORY:  [name] returns today for follow-up of her estrogen receptor negative breast cancer. As usual, she is alone, with her family not included in her visits. She tells me she saw Dr. Dalbert Batman and he asked her what my recommendations were and she said to him I have no recommendations. She is here to discuss this further and to make sure her pain is controlled.  REVIEW OF SYSTEMS: She is taking OxyContin 40 mg twice a day. She tells me she is only taking the oxycodone about twice a day although she takes 2 tablets at a time when she does take the breakthrough pain. I asked her if she needed refills and she said she did not, which is a concern since I had written her just enough medication for 2 weeks. She should be running out of medication at this point if she is taking it as prescribed. If she is not taking it as prescribed she is not telling me. She tells me her daughter has suggested that she take Advil 500 mg instead of narcotics and she would like to give that a try. She tells me she doesn't have much appetite and food does not taste well. She denies unusual headaches, cough, phlegm production, or change in bowel or bladder habits. She also saw the plastic surgeon and she says Dr. Elisabeth Cara feels the left chest wall wounds are healing well. A detailed review of systems today was otherwise noncontributory  HISTORY OF PRESENT ILLNESS: From the original intake note:  [name] noted a mass in her right breast sometime in 2011. 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 More  recently her husband twisted her arm to get the mass looked at, and she brought it to Dr. Pennie Banter attention. Diagnostic mammography and right ultrasonography at [facility] 03/02/2012 showed a 6.78 cm irregular mass in the inner aspect of the right breast. There was nipple retraction and erythema around the nipple. The largest lymph node was noted in the right axilla. By ultrasound the large irregular hypoechoic mass was noted in the breast with multiple enlarged axillary lymph nodes. Physical exam confirmed a hard breast with erythema around the right nipple extending inferiorly. The nipple is slightly inverted.  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   Her subsequent history is as detailed below  PAST MEDICAL HISTORY: Past Medical History:  Diagnosis Date  . Breast cancer ([place]) 03/08/13   right breast - Invasive Ductal Carcinoma   . GERD (gastroesophageal reflux disease)    occ  . Hx antineoplastic chemotherapy    two cycles of neoadjuvant cyclophosphamide, docetaxel, doxorubicin ["TAC"] at standard doses with neulasta support; refused neulasta with cycles 2 and 4; tolerated treatment poorly (b) docetaxel was held for final 4 cycles; completed cycle 6 neoadjuvant chemotherapy 12/27/2012    . Hypertension   . Osteopenia   . Pneumonia    hx  . Primary malignant neoplasm of left breast with stage 2 nodal metastasis per American Joint Committee on Cancer  7th edition (N2) ([place]) 01/09/2016  . S/P radiation therapy    1) Right Chest Wall and IM nodes / 50 Gy in 25 fractions /2) Right Supraclavicular fossa / 50 Gy in 25 fractions/1) Right Chest Wall and IM nodes / 50 Gy in 25 fractions /2) Right Supraclavicular fossa / 50 Gy in 25 fractions/3) Right Posterior Axillary boost / 6.55 Gy in 25 fractions/4) Right Chest Wall Scar boost / 10 Gy in 5 fractions      . Wears glasses   GERD osteopenia  PAST SURGICAL  HISTORY: Past Surgical History:  Procedure Laterality Date  . APPENDECTOMY  1992  . APPLICATION OF A-CELL OF EXTREMITY Left 07/28/2016   Procedure: APPLICATION OF A-CELL OF TO LEFT THIGH;  Surgeon: Irene Limbo, MD;  Location: [place];  Service: Plastics;  Laterality: Left;  . APPLICATION OF WOUND VAC Left 07/28/2016   Procedure: APPLICATION OF WOUND VAC, left breast;  Surgeon: Irene Limbo, MD;  Location: [place];  Service: Plastics;  Laterality: Left;  . BREAST BIOPSY Right   . CHOLECYSTECTOMY  2000  . MASTECTOMY Right 03/13/2013  . MASTECTOMY MODIFIED RADICAL Right 03/13/2013   Procedure: MASTECTOMY MODIFIED RADICAL;  Surgeon: [name], MD;  Location: [place];  Service: General;  Laterality: Right;  . MASTECTOMY MODIFIED RADICAL Left 07/28/2016   Procedure: MASTECTOMY MODIFIED RADICAL;  Surgeon: [name], MD;  Location: [place];  Service: General;  Laterality: Left;  . PORT-A-CATH REMOVAL  03/13/2013  . PORT-A-CATH REMOVAL Left 03/13/2013   Procedure: REMOVAL PORT-A-CATH;  Surgeon: [name], MD;  Location: [place];  Service: General;  Laterality: Left;  . PORTACATH PLACEMENT  08/15/2012   Procedure: INSERTION PORT-A-CATH;  Surgeon: [name], MD;  Location: [place];  Service: General;  Laterality: Left;  . PORTACATH PLACEMENT N/A 01/09/2016   Procedure: INSERTION PORT-A-CATH;  Surgeon: [name], MD;  Location: [facility];  Service: General;  Laterality: N/A;  . SKIN SPLIT GRAFT N/A 07/28/2016   Procedure: SKIN GRAFT SPLIT THICKNESS from left thigh to left chest;  Surgeon: Irene Limbo, MD;  Location: [place];  Service: Plastics;  Laterality: N/A;  . TUBAL LIGATION  1979    FAMILY HISTORY Family History  Problem Relation Age of Onset  . Heart disease Mother    the patient's father died in his sleep at age 25. The patient's mother died at the age of 78 from a myocardial infarction. The patient has one brother and 2 sisters, all surviving. There is no history of breast  or ovarian cancer in the family.  GYNECOLOGIC HISTORY:  (Reviewed 12/07/2013) Menarche age 1, first live birth age 13, she is GX P5, menopause age 81. She did not take hormone replacement.  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 Her husband [name] worked in the post office 46 years, and is also now retired. He is a former Company secretary. Son [name] lives in [place] and manages an [employer] store. Daughter Aven Purinton also lives in [place] and manages the IT Department at [employer]. Son Anteria Faulk III is a Engineer, materials. Daughter [name] is a Actuary for [employer]. Son [name] is an Actuary. The patient has 8 grandchildren. She attends [employer]   ADVANCED DIRECTIVES: Not in place  HEALTH MAINTENANCE:  (Updated 12/07/2013) Social History  Substance Use Topics  . Smoking status: Never Smoker  . Smokeless tobacco: Never Used  .  Alcohol use No     Colonoscopy: Not on file  PAP: Not on file  Bone density:  January 2011   Lipid panel:  Not on file/ Dr. Shelia Media  Allergies  Allergen Reactions  . Hydromorphone Nausea Only    Current Outpatient Prescriptions  Medication Sig Dispense Refill  . ibuprofen (ADVIL,MOTRIN) 600 MG tablet Take 1 tablet (600 mg total) by mouth every 6 (six) hours as needed. 100 tablet 3  . lisinopril (PRINIVIL,ZESTRIL) 10 MG tablet TAKE 1 TABLET EVERY DAY 90 tablet 1  . metoprolol succinate (TOPROL-XL) 50 MG 24 hr tablet TAKE 1 TABLET EVERY DAY WITH OR IMMEDIATELY FOLLOWING A MEAL 90 tablet 0  . oxyCODONE (OXYCONTIN) 40 mg 12 hr tablet Take 1 tablet (40 mg total) by mouth every 12 (twelve) hours. 30 tablet 0  . oxyCODONE-acetaminophen (PERCOCET/ROXICET) 5-325 MG tablet Take 1-2 tablets by mouth every 8 (eight) hours as needed for severe pain. 120 tablet 0  . potassium chloride (K-DUR) 10 MEQ tablet Take 1 tablet (10 mEq total) by mouth daily. (Patient taking  differently: Take 10 mEq by mouth daily as needed (for fatigue/sluggish). ) 60 tablet 5  . triamterene-hydrochlorothiazide (MAXZIDE) 75-50 MG tablet Take 1 tablet by mouth daily. 90 tablet 6   No current facility-administered medications for this visit.     OBJECTIVE: Middle-aged African American woman   Vitals:   09/11/16 1503  BP: (!) 124/55  Pulse: 98  Resp: 17  Temp: 98.2 F (36.8 C)     Body mass index is 21.73 kg/m.    ECOG FS: 2 Filed Weights   09/11/16 1503  Weight: 124 lb 9.6 oz (56.5 kg)    Sclerae unicteric, pupils round and equal Oropharynx clear and moist-- no thrush or other lesions Hard left supraclavicular adenopathy measuring approximately 2 cm, new Lungs no rales or rhonchi Heart regular rate and rhythm Abd soft, nontender, positive bowel sounds MSK no focal spinal tenderness, no upper extremity lymphedema Neuro: nonfocal, well oriented, appropriate affect Breasts: Refused to fully undress and where again, but allowed me to eat at the left chest wall and it does seem to be healing over nicely, with only a small area not yet fully epithelialized where an eschar was recently scratched off  Left chest wall status post January 2018 mastectomy, photograph 08/21/2016     Area of concern in the right chest wall 06/05/2016      Photo of left breast 04/20/2016      LAB RESULTS: CBC    Component Value Date/Time   WBC 5.4 09/11/2016 1453   WBC 9.4 07/29/2016 0515   RBC 3.08 (L) 09/11/2016 1453   RBC 2.84 (L) 07/29/2016 0515   HGB 10.4 (L) 09/11/2016 1453   HCT 31.6 (L) 09/11/2016 1453   PLT 295 09/11/2016 1453   MCV 102.6 (H) 09/11/2016 1453   MCH 33.7 09/11/2016 1453   MCH 34.2 (H) 07/29/2016 0515   MCHC 32.9 09/11/2016 1453   MCHC 33.6 07/29/2016 0515   RDW 13.5 09/11/2016 1453   LYMPHSABS 0.8 (L) 09/11/2016 1453   MONOABS 0.8 09/11/2016 1453   EOSABS 0.0 09/11/2016 1453   BASOSABS 0.0 09/11/2016 1453    CMP     Component Value  Date/Time   NA 132 (L) 09/11/2016 1453   K 4.2 09/11/2016 1453   CL 104 07/29/2016 0515   CL 107 01/11/2013 0853   CO2 26 09/11/2016 1453   GLUCOSE 107 09/11/2016 1453   GLUCOSE 123 (H) 01/11/2013 [id number]  BUN 26.0 09/11/2016 1453   CREATININE 1.7 (H) 09/11/2016 1453   CALCIUM 9.5 09/11/2016 1453   PROT 7.3 09/11/2016 1453   ALBUMIN 3.3 (L) 09/11/2016 1453   AST 13 09/11/2016 1453   ALT 7 09/11/2016 1453   ALKPHOS 52 09/11/2016 1453   BILITOT 0.38 09/11/2016 1453   GFRNONAA 45 (L) 07/29/2016 0515   GFRAA 52 (L) 07/29/2016 0515       STUDIES: No results found.  ASSESSMENT: 71 y.o. [place] woman   (1)  s/p Right breast upper outer quadrant and axillary node biopsy 03/08/2012 for a clinical T4, N1-2', stage IIIB invasive ductal carcinoma, grade 3, triple negative, with an MIB-1 of 93%.  (2) definitive staging and treatment delayed as patient canceled staging studies and tried alternative treatments; with eventual progression  (3) neoadjuvant chemotherapy started 08/23/12  (a) received two cycles of neoadjuvant cyclophosphamide, docetaxel, doxorubicin ["TAC"] at standard doses with neulasta support; refused neulasta with cycles 2 and 4; tolerated treatment poorly  (b) docetaxel was held for final 4 cycles; completed cycle 6 neoadjuvant chemotherapy 12/27/2012    (4) status post right modified radical mastectomy 03/13/2013 showing no residual carcinoma in the breast, but nine of 9 axillary lymph nodes sampled involved with macro metastases (ypTX, ypN2, stage IIIA)-- repeat prognostic panel again triple negative  (5) radiation therapy completed 05/31/2013  (6) patient opted against reconstruction  METASTATIC DISEASE: (7) left axillary adenopathy noted on mammography June 2015, confirmed on CT scan November 2015  (a) patient refused further workup or treatment on multiple visits (see notes)  (8) chest CT 12/16/2015 shows progressive left axillary and subpectoral adenopathy,  left upper extremity lymphedema, left breast soft tissue fullness and skin thickening  (a) left axillary lymph node biopsy 12/26/2015 confirms metastatic carcinoma, triple negative  (9) started gemcitabine/carboplatin 01/13/2016, repeated day 1 and day 8 of each 21 cycle   (a) Day 8 cycle 2 held because of low counts  (b) restaging CT scan of the chest 04/09/2016 after 6 cycles of chemotherapy shows evidence of mixed response  (c) treatments changed to every 2 weeks beginning with 04/20/2016 dose  (10) poorly controlled hypertension: Maxide added to lisinopril and metoprolol 04/13/2016. TTS-2 added November 2017  (11) left upper extremity lymphedema  (12) status post left total mastectomy 07/28/2016, again triple negative, with  1. Thoracoabdominal fasciocutaneous flap from left abdomen to left chest   2. Split thickness skin graft from left thigh to left chest 150 cm2   3. 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 Dalbert Batman not remove her lymph nodes. I reviewed the fact that we give chemotherapy that we hope will work and if it does not work we tried different type of chemotherapy. This is the pattern in advanced disease. Dr. Owens Shark could not clear her left axilla because that area is firmly attached to muscles, nerves, blood vessels, and bone, and also because it would not affect her survival.  I pointed out that she has a hard mass in her left neck which is tumor.  I gave him a sleep lab for choices. She can consider eribulin, which is very standard chemotherapy in this setting and has a track record of response in the 20-30% range. She can consider pembrolizumab, which is not chemotherapy, and would be  experimental in this setting. The response from rate in the  only study I know using this and breast cancer was 12%. She can consider radiation which she has not had and which might control the disease already spreading in her neck for example. Otherwise I suggested hospice.  She says she does not like any of those options. She is not ready to make a decision, but will continue to pray about it.  In the meantime I refilled her OxyContin 40 mg to take twice daily. I wrote her a prescription for Motrin 600 mg to take up to 3 times a day with food as needed. She may continue to take the oxycodone for breakthrough pain as well.  She will see me again in 2 weeks. We will again go over her treatment options and pain management. She knows to call for any other issues that may develop before then.    Chauncey Cruel, MD    09/12/2016

## 2016-09-15 ENCOUNTER — Ambulatory Visit: Payer: Medicare Other | Admitting: Physical Therapy

## 2016-09-15 DIAGNOSIS — M25612 Stiffness of left shoulder, not elsewhere classified: Secondary | ICD-10-CM | POA: Diagnosis not present

## 2016-09-15 DIAGNOSIS — I972 Postmastectomy lymphedema syndrome: Secondary | ICD-10-CM

## 2016-09-15 DIAGNOSIS — M6281 Muscle weakness (generalized): Secondary | ICD-10-CM | POA: Diagnosis not present

## 2016-09-15 DIAGNOSIS — M25611 Stiffness of right shoulder, not elsewhere classified: Secondary | ICD-10-CM | POA: Diagnosis not present

## 2016-09-15 DIAGNOSIS — M25512 Pain in left shoulder: Secondary | ICD-10-CM | POA: Diagnosis not present

## 2016-09-15 NOTE — Therapy (Signed)
[place], [place], [address] Phone: [phone]   Fax:  [phone]  Physical Therapy Treatment  Patient Details  Name: Janice Powell MRN: [id number] Date of Birth: [birth year] Referring Provider: [place]  Encounter Date: 09/15/2016      PT End of Session - 09/15/16 1548    Visit Number 5   Number of Visits 13   Date for PT Re-Evaluation 09/28/16   PT Start Time 1302   PT Stop Time 1349   PT Time Calculation (min) 47 min   Activity Tolerance Patient tolerated treatment well   Behavior During Therapy [facility] for tasks assessed/performed      Past Medical History:  Diagnosis Date  . Breast cancer ([place]) 03/08/13   right breast - Invasive Ductal Carcinoma   . GERD (gastroesophageal reflux disease)    occ  . Hx antineoplastic chemotherapy    two cycles of neoadjuvant cyclophosphamide, docetaxel, doxorubicin ["TAC"] at standard doses with neulasta support; refused neulasta with cycles 2 and 4; tolerated treatment poorly (b) docetaxel was held for final 4 cycles; completed cycle 6 neoadjuvant chemotherapy 12/27/2012    . Hypertension   . Osteopenia   . Pneumonia    hx  . Primary malignant neoplasm of left breast with stage 2 nodal metastasis per American Joint Committee on Cancer 7th edition (N2) ([place]) 01/09/2016  . S/P radiation therapy    1) Right Chest Wall and IM nodes / 50 Gy in 25 fractions /2) Right Supraclavicular fossa / 50 Gy in 25 fractions/1) Right Chest Wall and IM nodes / 50 Gy in 25 fractions /2) Right Supraclavicular fossa / 50 Gy in 25 fractions/3) Right Posterior Axillary boost / 6.55 Gy in 25 fractions/4) Right Chest Wall Scar boost / 10 Gy in 5 fractions      . Wears glasses     Past Surgical History:  Procedure Laterality Date  . APPENDECTOMY  1992  . APPLICATION OF A-CELL OF EXTREMITY Left 07/28/2016   Procedure: APPLICATION OF A-CELL OF TO LEFT THIGH;  Surgeon: Irene Limbo,  MD;  Location: [place];  Service: Plastics;  Laterality: Left;  . APPLICATION OF WOUND VAC Left 07/28/2016   Procedure: APPLICATION OF WOUND VAC, left breast;  Surgeon: Irene Limbo, MD;  Location: [place];  Service: Plastics;  Laterality: Left;  . BREAST BIOPSY Right   . CHOLECYSTECTOMY  2000  . MASTECTOMY Right 03/13/2013  . MASTECTOMY MODIFIED RADICAL Right 03/13/2013   Procedure: MASTECTOMY MODIFIED RADICAL;  Surgeon: [name], MD;  Location: [place];  Service: General;  Laterality: Right;  . MASTECTOMY MODIFIED RADICAL Left 07/28/2016   Procedure: MASTECTOMY MODIFIED RADICAL;  Surgeon: [name], MD;  Location: [place];  Service: General;  Laterality: Left;  . PORT-A-CATH REMOVAL  03/13/2013  . PORT-A-CATH REMOVAL Left 03/13/2013   Procedure: REMOVAL PORT-A-CATH;  Surgeon: [name], MD;  Location: [place];  Service: General;  Laterality: Left;  . PORTACATH PLACEMENT  08/15/2012   Procedure: INSERTION PORT-A-CATH;  Surgeon: [name], MD;  Location: [place];  Service: General;  Laterality: Left;  . PORTACATH PLACEMENT N/A 01/09/2016   Procedure: INSERTION PORT-A-CATH;  Surgeon: [name], MD;  [facility];  Service: General;  Laterality: N/A;  . SKIN SPLIT GRAFT N/A 07/28/2016   Procedure: SKIN GRAFT SPLIT THICKNESS from left thigh to left chest;  Surgeon: Irene Limbo, MD;  Location: [place];  Service: Plastics;  Laterality: N/A;  . TUBAL LIGATION  1979    There were no vitals filed for this visit.      Subjective Assessment - 09/15/16 1306    Subjective Had to take the bandages off the day after last session because they were uncomfortable.  Did manage to allow the elbow to stay straighter.  She wonders about how long therapy will continue.     Currently in Pain? No/denies                         [facility] Adult PT Treatment/Exercise - 09/15/16 0001      Manual Therapy   Manual Lymphatic Drainage (MLD) In supine, 5 diaphragmatic breaths, then  short neck, left groin and axillo-inguinal anastomosis, and left UE working proximal to distal and then retracing steps.   Compression Bandaging to left UE:  lotion applied, then thin stockinette, Elastomull on all five fingers, Artiflex x 2 from hand to axilla (adding on Artiflex for increased padding), rectangle of 1/4 inch gray foam at antecubital fossa, and 1-6 cm., 1-8 cm., and 1-12 cm. short stretch bandage from hand to shoulder, avoiding making it tight so that she tolerates it better.                        [facility] Goals - 08/31/16 1722      CC Long Term Goal  #1   Title Patient and/or her husband will be able to perform manual lymph drainage independently   Time 4   Period Weeks   Status New     CC Long Term Goal  #2   Title Patient will obtain appropriate compression garments for long term management of lymphedema   Time 4   Period Weeks   Status New     CC Long Term Goal  #3   Title Patient will demonstrate 120 degrees of left shoulder flexion to allow pt to reach items on shelves   Baseline 91   Time 4   Period Weeks   Status New     CC Long Term Goal  #4   Title Pt will demonstrate 90 degrees of left shoulder abduction ROM to allow pt to return to prior level of function   Baseline 62   Time 4   Period Weeks   Status New     CC Long Term Goal  #5   Title Patient will be independent in a home exercise program for continued ROM and strengthening of bilateral shoulders   Time 4   Period Weeks   Status New            Plan - 09/15/16 1548    Clinical Impression Statement Pt. seemed surprised to learn today that we would like her to try to keep the bandages on from one appointment to the next, unless she has pain that she cannot resolve with exercise.  We also discussed that she is scheduled for 2x/week but would probably do better coming 3x/week; however, she did not want to change her schedule right now. Therapist offered to teach her  husband how to bandage, but patient declined this, saying that her husband was heavy-handed and she didn't want him to wrap her. We further discussed that we might consider a velcro garment for the arm to get her something that is easier to don than a standard compression sleeve, but this was not finally decided.  Arm was not measured today but looks big; bandages have  been off for several days.   Rehab Potential Fair   Clinical Impairments Affecting Rehab Potential metastatic breast cancer to left axillary nodes   PT Frequency 3x / week   PT Duration 4 weeks   PT Treatment/Interventions ADLs/Self Care Home Management;DME Instruction;Therapeutic exercise;Patient/family education;Orthotic Fit/Training;Manual techniques;Manual lymph drainage;Compression bandaging;Passive range of motion;Taping;Scar mobilization   PT Next Visit Plan Remeasure; assess how pt tolerated bandages since last session, continue complete decongestive therapy; instruct husband in manual lymph drainage when able. May need to consider looking into garments sooner rather than later if pt continues to not tolerate bandages well due to her metastatic disease affecting her sudden onset of Lt UE lymphedema.   PT Home Exercise Plan remedial UE lymphedema exercises   Consulted and Agree with Plan of Care Patient      Patient will benefit from skilled therapeutic intervention in order to improve the following deficits and impairments:  Increased edema, Decreased range of motion, Decreased strength, Impaired UE functional use, Decreased knowledge of precautions, Decreased scar mobility, Postural dysfunction, Pain, Increased fascial restricitons, Decreased skin integrity  Visit Diagnosis: Postmastectomy lymphedema  Stiffness of left shoulder, not elsewhere classified     Problem List Patient Active Problem List   Diagnosis Date Noted  . Malignant neoplasm of overlapping sites of left breast in female, estrogen receptor negative  ([place]) 08/27/2016  . Cancer related pain 08/27/2016  . SOB (shortness of breath) 07/24/2016  . Dehydration 07/24/2016  . Rash 07/17/2016  . Cellulitis 05/25/2016  . Recurrent cancer of right breast ([place]) 01/10/2016  . Lymphedema of arm 12/17/2015  . Breast mass, left 06/15/2015  . Atherosclerosis of aorta ([place]) 08/02/2014  . Breast cancer of upper-outer quadrant of right female breast ([place]) 03/31/2013  . Hypokalemia 12/13/2012  . Hypertension 07/04/2012    SALISBURY,DONNA 09/15/2016, 3:53 PM  [place], [place], [address] Phone: [phone]   Fax:  [phone]  Name: Janice Powell MRN: [id number] Date of Birth: [birth year]  Serafina Royals, PT 09/15/16 3:53 PM

## 2016-09-17 ENCOUNTER — Ambulatory Visit: Payer: Medicare Other

## 2016-09-17 DIAGNOSIS — M25512 Pain in left shoulder: Secondary | ICD-10-CM | POA: Diagnosis not present

## 2016-09-17 DIAGNOSIS — I972 Postmastectomy lymphedema syndrome: Secondary | ICD-10-CM | POA: Diagnosis not present

## 2016-09-17 DIAGNOSIS — M25611 Stiffness of right shoulder, not elsewhere classified: Secondary | ICD-10-CM | POA: Diagnosis not present

## 2016-09-17 DIAGNOSIS — M6281 Muscle weakness (generalized): Secondary | ICD-10-CM | POA: Diagnosis not present

## 2016-09-17 DIAGNOSIS — M25612 Stiffness of left shoulder, not elsewhere classified: Secondary | ICD-10-CM

## 2016-09-17 NOTE — Therapy (Signed)
[place], [place], [address] Phone: [phone]   Fax:  [phone]  Physical Therapy Treatment  Patient Details  Name: Janice Powell MRN: [id number] Date of Birth: 05/06/1946 Referring Provider: Thimmappa  Encounter Date: 09/17/2016      PT End of Session - 09/17/16 1202    Visit Number 6   Number of Visits 13   Date for PT Re-Evaluation 09/28/16   PT Start Time 1107   PT Stop Time 1155   PT Time Calculation (min) 48 min   Activity Tolerance Patient tolerated treatment well   Behavior During Therapy [facility] for tasks assessed/performed      Past Medical History:  Diagnosis Date  . Breast cancer ([place]) 03/08/13   right breast - Invasive Ductal Carcinoma   . GERD (gastroesophageal reflux disease)    occ  . Hx antineoplastic chemotherapy    two cycles of neoadjuvant cyclophosphamide, docetaxel, doxorubicin ["TAC"] at standard doses with neulasta support; refused neulasta with cycles 2 and 4; tolerated treatment poorly (b) docetaxel was held for final 4 cycles; completed cycle 6 neoadjuvant chemotherapy 12/27/2012    . Hypertension   . Osteopenia   . Pneumonia    hx  . Primary malignant neoplasm of left breast with stage 2 nodal metastasis per American Joint Committee on Cancer 7th edition (N2) ([place]) 01/09/2016  . S/P radiation therapy    1) Right Chest Wall and IM nodes / 50 Gy in 25 fractions /2) Right Supraclavicular fossa / 50 Gy in 25 fractions/1) Right Chest Wall and IM nodes / 50 Gy in 25 fractions /2) Right Supraclavicular fossa / 50 Gy in 25 fractions/3) Right Posterior Axillary boost / 6.55 Gy in 25 fractions/4) Right Chest Wall Scar boost / 10 Gy in 5 fractions      . Wears glasses     Past Surgical History:  Procedure Laterality Date  . APPENDECTOMY  1992  . APPLICATION OF A-CELL OF EXTREMITY Left 07/28/2016   Procedure: APPLICATION OF A-CELL OF TO LEFT THIGH;  Surgeon: Irene Limbo,  MD;  Location: [place];  Service: Plastics;  Laterality: Left;  . APPLICATION OF WOUND VAC Left 07/28/2016   Procedure: APPLICATION OF WOUND VAC, left breast;  Surgeon: Irene Limbo, MD;  Location: [place];  Service: Plastics;  Laterality: Left;  . BREAST BIOPSY Right   . CHOLECYSTECTOMY  2000  . MASTECTOMY Right 03/13/2013  . MASTECTOMY MODIFIED RADICAL Right 03/13/2013   Procedure: MASTECTOMY MODIFIED RADICAL;  Surgeon: [name], MD;  Location: [place];  Service: General;  Laterality: Right;  . MASTECTOMY MODIFIED RADICAL Left 07/28/2016   Procedure: MASTECTOMY MODIFIED RADICAL;  Surgeon: [name], MD;  Location: [place];  Service: General;  Laterality: Left;  . PORT-A-CATH REMOVAL  03/13/2013  . PORT-A-CATH REMOVAL Left 03/13/2013   Procedure: REMOVAL PORT-A-CATH;  Surgeon: [name], MD;  Location: [place];  Service: General;  Laterality: Left;  . PORTACATH PLACEMENT  08/15/2012   Procedure: INSERTION PORT-A-CATH;  Surgeon: [name], MD;  Location: [place];  Service: General;  Laterality: Left;  . PORTACATH PLACEMENT N/A 01/09/2016   Procedure: INSERTION PORT-A-CATH;  Surgeon: [name], MD;  [facility];  Service: General;  Laterality: N/A;  . SKIN SPLIT GRAFT N/A 07/28/2016   Procedure: SKIN GRAFT SPLIT THICKNESS from left thigh to left chest;  Surgeon: Irene Limbo, MD;  Location: [place];  Service: Plastics;  Laterality: N/A;  . TUBAL LIGATION  1979    There were no vitals filed for this visit.      Subjective Assessment - 09/17/16 1111    Subjective I left the bandages on from Tuesday until this morning. They were as comfortable as they could've been.    Pertinent History Ms. Pfingsten noted a mass in her right breast sometime in 2011. 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 More recently her husband twisted her arm to get the mass looked at, and she brought it to Dr. Pennie Banter attention. Diagnostic mammography and  right ultrasonography at [facility] 03/02/2012 showed a 6.78 cm irregular mass in the inner aspect of the right breast. There was nipple retraction and erythema around the nipple. The largest lymph node was noted in the right axilla. By ultrasound the large irregular hypoechoic mass was noted in the breast with multiple enlarged axillary lymph nodes. Physical exam confirmed a hard breast with erythema around the right nipple extending inferiorly. The nipple is slightly inverted.  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   She underwent chemotherapy treatment for that.  2015 left adenopathy was noted; 2017 further left axillary and subpectoral adenopathy noted on scans and confirmed with biopsy.   Pt underwent chemotherapy and completed in November 2017. Pt underwent a left mastectomy on January 2nd and is unsure if there were lymph nodes removed. Pt states it was suggested that she complete radiation but pt does not plan to at this time.   HTN controlled with meds.     Patient Stated Goals for the swelling to go down   Currently in Pain? Yes   Pain Score 5    Pain Location Axilla   Pain Orientation Left   Pain Descriptors / Indicators Throbbing   Pain Type Chronic pain   Pain Onset 1 to 4 weeks ago   Pain Frequency Intermittent   Aggravating Factors  increased swelling   Pain Relieving Factors bandaging and pain meds help some               LYMPHEDEMA/ONCOLOGY QUESTIONNAIRE - 09/17/16 1114      Left Upper Extremity Lymphedema   10 cm Proximal to Olecranon Process 39.5 cm   Olecranon Process 34.8 cm   10 cm Proximal to Ulnar Styloid Process 28.6 cm   Just Proximal to Ulnar Styloid Process 19.4 cm   Across Hand at [employer] 19.3 cm   At [place] of 2nd Digit 6.3 cm                  [facility] Adult PT Treatment/Exercise - 09/17/16 0001      Manual Therapy   Manual Lymphatic Drainage (MLD) In  supine, 5 diaphragmatic breaths, then short neck, left groin and axillo-inguinal anastomosis, and left UE working proximal to distal and then retracing steps.   Compression Bandaging to left UE:  Biotone applied, then thick stockinette, Elastomull on all fingers 1-4, Artiflex x 1 from hand to axilla with 1/2" gray foam at dorsal hand to decrease c/o pressure at metacarpals, and 1-6 cm., 1-8 cm., and 1-12 cm. short stretch bandage from hand to shoulder, avoiding making it tight so that she tolerates it better.                        [facility] Goals - 08/31/16 [phone]  CC Long Term Goal  #1   Title Patient and/or her husband will be able to perform manual lymph drainage independently   Time 4   Period Weeks   Status New     CC Long Term Goal  #2   Title Patient will obtain appropriate compression garments for long term management of lymphedema   Time 4   Period Weeks   Status New     CC Long Term Goal  #3   Title Patient will demonstrate 120 degrees of left shoulder flexion to allow pt to reach items on shelves   Baseline 91   Time 4   Period Weeks   Status New     CC Long Term Goal  #4   Title Pt will demonstrate 90 degrees of left shoulder abduction ROM to allow pt to return to prior level of function   Baseline 62   Time 4   Period Weeks   Status New     CC Long Term Goal  #5   Title Patient will be independent in a home exercise program for continued ROM and strengthening of bilateral shoulders   Time 4   Period Weeks   Status New            Plan - 09/17/16 1202    Clinical Impression Statement Pt came in after having just removed her bandages this morning. Her circumference measurements were reduced except at her forearm and elbow were both increased by 1-2 cms. Answered her questions about when she can get a garment and explained to her that we normally like to wrap pts for at least 4 weeks with her severity of lymphedema, however, with her  metastatic disease we may not see typical reductions so if she feels ready to get velcro garment soon we will begin that. She decided to wrap at least another week.    Rehab Potential Fair   Clinical Impairments Affecting Rehab Potential metastatic breast cancer to left axillary nodes   PT Frequency 3x / week   PT Duration 4 weeks   PT Treatment/Interventions ADLs/Self Care Home Management;DME Instruction;Therapeutic exercise;Patient/family education;Orthotic Fit/Training;Manual techniques;Manual lymph drainage;Compression bandaging;Passive range of motion;Taping;Scar mobilization   PT Next Visit Plan Cont to assess how pt tolerated bandages since last session, continue complete decongestive therapy; instruct husband in manual lymph drainage if pt willing. May need to consider looking into garments sooner rather than later if pt continues to not tolerate bandages well due to her metastatic disease affecting her sudden onset of Lt UE lymphedema.   PT Home Exercise Plan remedial UE lymphedema exercises   Consulted and Agree with Plan of Care Patient      Patient will benefit from skilled therapeutic intervention in order to improve the following deficits and impairments:  Increased edema, Decreased range of motion, Decreased strength, Impaired UE functional use, Decreased knowledge of precautions, Decreased scar mobility, Postural dysfunction, Pain, Increased fascial restricitons, Decreased skin integrity  Visit Diagnosis: Postmastectomy lymphedema  Stiffness of left shoulder, not elsewhere classified     Problem List Patient Active Problem List   Diagnosis Date Noted  . Malignant neoplasm of overlapping sites of left breast in female, estrogen receptor negative ([place]) 08/27/2016  . Cancer related pain 08/27/2016  . SOB (shortness of breath) 07/24/2016  . Dehydration 07/24/2016  . Rash 07/17/2016  . Cellulitis 05/25/2016  . Recurrent cancer of right breast ([place]) 01/10/2016  . Lymphedema  of arm 12/17/2015  . Breast mass, left 06/15/2015  .  Atherosclerosis of aorta ([place]) 08/02/2014  . Breast cancer of upper-outer quadrant of right female breast ([place]) 03/31/2013  . Hypokalemia 12/13/2012  . Hypertension 07/04/2012    Otelia Limes, PTA 09/17/2016, 12:08 PM  [place], [place], [address] Phone: [phone]   Fax:  [phone]  Name: Janice Powell MRN: [id number] Date of Birth: [birth year]

## 2016-09-22 ENCOUNTER — Ambulatory Visit: Payer: Medicare Other | Admitting: Physical Therapy

## 2016-09-22 DIAGNOSIS — I972 Postmastectomy lymphedema syndrome: Secondary | ICD-10-CM

## 2016-09-22 DIAGNOSIS — M6281 Muscle weakness (generalized): Secondary | ICD-10-CM

## 2016-09-22 DIAGNOSIS — M25612 Stiffness of left shoulder, not elsewhere classified: Secondary | ICD-10-CM | POA: Diagnosis not present

## 2016-09-22 DIAGNOSIS — M25512 Pain in left shoulder: Secondary | ICD-10-CM | POA: Diagnosis not present

## 2016-09-22 DIAGNOSIS — M25611 Stiffness of right shoulder, not elsewhere classified: Secondary | ICD-10-CM | POA: Diagnosis not present

## 2016-09-22 NOTE — Therapy (Signed)
[place], [place], [address] Phone: [phone]   Fax:  [phone]  Physical Therapy Treatment  Patient Details  Name: Janice Powell MRN: [id number] Date of Birth: Jan 26, 1946 Referring Provider: Thimmappa  Encounter Date: 09/22/2016      PT End of Session - 09/22/16 1450    Visit Number 7   Number of Visits 13   Date for PT Re-Evaluation 09/28/16   PT Start Time [id number]   PT Stop Time 1430   PT Time Calculation (min) 45 min   Activity Tolerance Patient tolerated treatment well   Behavior During Therapy [facility] for tasks assessed/performed      Past Medical History:  Diagnosis Date  . Breast cancer ([place]) 03/08/13   right breast - Invasive Ductal Carcinoma   . GERD (gastroesophageal reflux disease)    occ  . Hx antineoplastic chemotherapy    two cycles of neoadjuvant cyclophosphamide, docetaxel, doxorubicin ["TAC"] at standard doses with neulasta support; refused neulasta with cycles 2 and 4; tolerated treatment poorly (b) docetaxel was held for final 4 cycles; completed cycle 6 neoadjuvant chemotherapy 12/27/2012    . Hypertension   . Osteopenia   . Pneumonia    hx  . 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 S/P radiation therapy    1) Right Chest Wall and IM nodes / 50 Gy in 25 fractions /2) Right Supraclavicular fossa / 50 Gy in 25 fractions/1) Right Chest Wall and IM nodes / 50 Gy in 25 fractions /2) Right Supraclavicular fossa / 50 Gy in 25 fractions/3) Right Posterior Axillary boost / 6.55 Gy in 25 fractions/4) Right Chest Wall Scar boost / 10 Gy in 5 fractions      . Wears glasses     Past Surgical History:  Procedure Laterality Date  . APPENDECTOMY  1992  . APPLICATION OF A-CELL OF EXTREMITY Left 07/28/2016   Procedure: APPLICATION OF A-CELL OF TO LEFT THIGH;  Surgeon: Irene Limbo,  MD;  Location: [place];  Service: Plastics;  Laterality: Left;  . APPLICATION OF WOUND VAC Left 07/28/2016   Procedure: APPLICATION OF WOUND VAC, left breast;  Surgeon: Irene Limbo, MD;  Location: [place];  Service: Plastics;  Laterality: Left;  . BREAST BIOPSY Right   . CHOLECYSTECTOMY  2000  . MASTECTOMY Right 03/13/2013  . MASTECTOMY MODIFIED RADICAL Right 03/13/2013   Procedure: MASTECTOMY MODIFIED RADICAL;  Surgeon: [name], MD;  Location: [place];  Service: General;  Laterality: Right;  . MASTECTOMY MODIFIED RADICAL Left 07/28/2016   Procedure: MASTECTOMY MODIFIED RADICAL;  Surgeon: [name], MD;  Location: [place];  Service: General;  Laterality: Left;  . PORT-A-CATH REMOVAL  03/13/2013  . PORT-A-CATH REMOVAL Left 03/13/2013   Procedure: REMOVAL PORT-A-CATH;  Surgeon: [name], MD;  Location: [place];  Service: General;  Laterality: Left;  . PORTACATH PLACEMENT  08/15/2012   Procedure: INSERTION PORT-A-CATH;  Surgeon: [name], MD;  Location: [place];  Service: General;  Laterality: Left;  . PORTACATH PLACEMENT N/A 01/09/2016   Procedure: INSERTION PORT-A-CATH;  Surgeon: [name], MD;  [facility];  Service: General;  Laterality: N/A;  . SKIN SPLIT GRAFT N/A 07/28/2016   Procedure: SKIN GRAFT SPLIT THICKNESS from left thigh to left chest;  Surgeon: Irene Limbo, MD;  Location: [place];  Service: Plastics;  Laterality: N/A;  . TUBAL LIGATION  1979    There were no vitals filed for this visit.      Subjective Assessment - 09/22/16 1346    Subjective Pt states she took the badages off last Friday.  She feels that her arm is being reduced and she wants to continue on with the bandaging. She has decreased distal phalanx extension of fingers 4 and 5 ( more extension weakness in 5th finger that she says she may have had for  a while.  She can't say if this is a new symptom.  She asks about the velcro garment and agrees to have it measured soon.    Pertinent  History Ms. [place] noted a mass in her right breast sometime in 2011. 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 More recently her husband twisted her arm to get the mass looked at, and she brought it to Dr. Pennie Banter attention. Diagnostic mammography and right ultrasonography at [facility] 03/02/2012 showed a 6.78 cm irregular mass in the inner aspect of the right breast. There was nipple retraction and erythema around the nipple. The largest lymph node was noted in the right axilla. By ultrasound the large irregular hypoechoic mass was noted in the breast with multiple enlarged axillary lymph nodes. Physical exam confirmed a hard breast with erythema around the right nipple extending inferiorly. The nipple is slightly inverted.  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   She underwent chemotherapy treatment for that.  2015 left adenopathy was noted; 2017 further left axillary and subpectoral adenopathy noted on scans and confirmed with biopsy.   Pt underwent chemotherapy and completed in November 2017. Pt underwent a left mastectomy on January 2nd and is unsure if there were lymph nodes removed. Pt states it was suggested that she complete radiation but pt does not plan to at this time.   HTN controlled with meds.     Patient Stated Goals for the swelling to go down   Currently in Pain? No/denies  she took 2 pain pills before she came today                          [facility] Adult PT Treatment/Exercise - 09/22/16 0001      Elbow Exercises   Elbow Flexion AAROM;Left;5 reps   Elbow Extension AAROM;Left;5 reps   Forearm Supination AROM;Left;5 reps   Forearm Pronation AROM;Left;5 reps   Wrist Flexion AROM;Left;5 reps   Wrist Extension AROM;Left;5 reps     Shoulder Exercises: Supine   External Rotation AROM;Left;5 reps   Flexion AAROM;Left;5 reps     Hand Exercises    MCPJ Extension AAROM;Left;5 reps   PIPJ Extension AAROM;Left;5 reps     Manual Therapy   Manual Lymphatic Drainage (MLD) In supine, 5 diaphragmatic breaths, then short neck, left groin and axillo-inguinal anastomosis, and left UE working proximal to distal and then retracing steps extra tiime spent at firm area at anterior axilla that has pitting edema     Compression Bandaging to left UE:  Biotone applied, then thick stockinette, Elastomull on all fingers 1-5, Artiflex x 2 from hand to axilla  and 1-6 cm., 1-8 cm., and 1-12 cm. short stretch bandage from hand to shoulder, avoiding making it tight so that she tolerates it better.  [facility] Goals - 08/31/16 1722      CC Long Term Goal  #1   Title Patient and/or her husband will be able to perform manual lymph drainage independently   Time 4   Period Weeks   Status New     CC Long Term Goal  #2   Title Patient will obtain appropriate compression garments for long term management of lymphedema   Time 4   Period Weeks   Status New     CC Long Term Goal  #3   Title Patient will demonstrate 120 degrees of left shoulder flexion to allow pt to reach items on shelves   Baseline 91   Time 4   Period Weeks   Status New     CC Long Term Goal  #4   Title Pt will demonstrate 90 degrees of left shoulder abduction ROM to allow pt to return to prior level of function   Baseline 62   Time 4   Period Weeks   Status New     CC Long Term Goal  #5   Title Patient will be independent in a home exercise program for continued ROM and strengthening of bilateral shoulders   Time 4   Period Weeks   Status New            Plan - 09/22/16 1451    Clinical Impression Statement Pitting edema continues in left UE especially in forearm, upper arm and anterior axilla, with weakness in distal finger extension of fingers 4 and 5. She continues to tolerate bandages for only about a day and is interested in  finding out about coverage for a velcro garment that she can take on and off.  Pt did not seem to understand or remember that she may have metastasis in axillary nodes blocking lymphatic flow.  Face sheet faxed to [employer] to evaluate for insurance benefit.    Clinical Impairments Affecting Rehab Potential metastatic breast cancer to left axillary nodes   PT Frequency 3x / week   PT Next Visit Plan Follow up with velcro garment.  Continue to teach/reinforce  remedial UE ROM and finger extension exercise.    Consulted and Agree with Plan of Care Patient      Patient will benefit from skilled therapeutic intervention in order to improve the following deficits and impairments:  Increased edema, Decreased range of motion, Decreased strength, Impaired UE functional use, Decreased knowledge of precautions, Decreased scar mobility, Postural dysfunction, Pain, Increased fascial restricitons, Decreased skin integrity  Visit Diagnosis: Postmastectomy lymphedema  Stiffness of left shoulder, not elsewhere classified  Muscle weakness (generalized)     Problem List Patient Active Problem List   Diagnosis Date Noted  . Malignant neoplasm of overlapping sites of left breast in female, estrogen receptor negative ([place]) 08/27/2016  . Cancer related pain 08/27/2016  . SOB (shortness of breath) 07/24/2016  . Dehydration 07/24/2016  . Rash 07/17/2016  . Cellulitis 05/25/2016  . Recurrent cancer of right breast ([place]) 01/10/2016  . Lymphedema of arm 12/17/2015  . Breast mass, left 06/15/2015  . Atherosclerosis of aorta ([place]) 08/02/2014  . Breast cancer of upper-outer quadrant of right female breast ([place]) 03/31/2013  . Hypokalemia 12/13/2012  . Hypertension 07/04/2012   Donato Heinz. Owens Shark PT  Norwood Levo 09/22/2016, 5:19 PM  [place], [place], [address] Phone: [phone]   Fax:  [phone]  Name: Janice Powell MRN: [id number] Date  of Birth: 05-Nov-1945

## 2016-09-24 ENCOUNTER — Encounter: Payer: Self-pay | Admitting: Physical Therapy

## 2016-09-24 ENCOUNTER — Ambulatory Visit: Payer: Medicare Other | Attending: Plastic Surgery | Admitting: Physical Therapy

## 2016-09-24 DIAGNOSIS — M6281 Muscle weakness (generalized): Secondary | ICD-10-CM | POA: Insufficient documentation

## 2016-09-24 DIAGNOSIS — M25512 Pain in left shoulder: Secondary | ICD-10-CM | POA: Diagnosis not present

## 2016-09-24 DIAGNOSIS — I972 Postmastectomy lymphedema syndrome: Secondary | ICD-10-CM | POA: Insufficient documentation

## 2016-09-24 DIAGNOSIS — M25612 Stiffness of left shoulder, not elsewhere classified: Secondary | ICD-10-CM | POA: Insufficient documentation

## 2016-09-24 NOTE — Therapy (Signed)
[place], [place], [address] Phone: [phone]   Fax:  [phone]  Physical Therapy Treatment  Patient Details  Name: Janice Powell MRN: [id number] Date of Birth: 04-04-1946 Referring Provider: [place]  Encounter Date: 09/24/2016      PT End of Session - 09/24/16 1343    Visit Number 8   Number of Visits 13   Date for PT Re-Evaluation 09/28/16   PT Start Time 1301   PT Stop Time [id number]  pt requested session be cut short because she was in pain and needed her pain meds   PT Time Calculation (min) 37 min   Activity Tolerance Patient limited by pain   Behavior During Therapy [facility] for tasks assessed/performed      Past Medical History:  Diagnosis Date  . Breast cancer ([place]) 03/08/13   right breast - Invasive Ductal Carcinoma   . GERD (gastroesophageal reflux disease)    occ  . Hx antineoplastic chemotherapy    two cycles of neoadjuvant cyclophosphamide, docetaxel, doxorubicin ["TAC"] at standard doses with neulasta support; refused neulasta with cycles 2 and 4; tolerated treatment poorly (b) docetaxel was held for final 4 cycles; completed cycle 6 neoadjuvant chemotherapy 12/27/2012    . Hypertension   . Osteopenia   . Pneumonia    hx  . Primary malignant neoplasm of left breast with stage 2 nodal metastasis per American Joint Committee on Cancer 7th edition (N2) ([place]) 01/09/2016  . S/P radiation therapy    1) Right Chest Wall and IM nodes / 50 Gy in 25 fractions /2) Right Supraclavicular fossa / 50 Gy in 25 fractions/1) Right Chest Wall and IM nodes / 50 Gy in 25 fractions /2) Right Supraclavicular fossa / 50 Gy in 25 fractions/3) Right Posterior Axillary boost / 6.55 Gy in 25 fractions/4) Right Chest Wall Scar boost / 10 Gy in 5 fractions      . Wears glasses     Past Surgical History:  Procedure Laterality Date  . APPENDECTOMY  1992  . APPLICATION OF A-CELL OF EXTREMITY Left 07/28/2016    Procedure: APPLICATION OF A-CELL OF TO LEFT THIGH;  Surgeon: Irene Limbo, MD;  Location: [place];  Service: Plastics;  Laterality: Left;  . APPLICATION OF WOUND VAC Left 07/28/2016   Procedure: APPLICATION OF WOUND VAC, left breast;  Surgeon: Irene Limbo, MD;  Location: [place];  Service: Plastics;  Laterality: Left;  . BREAST BIOPSY Right   . CHOLECYSTECTOMY  2000  . MASTECTOMY Right 03/13/2013  . MASTECTOMY MODIFIED RADICAL Right 03/13/2013   Procedure: MASTECTOMY MODIFIED RADICAL;  Surgeon: [name], MD;  Location: [place];  Service: General;  Laterality: Right;  . MASTECTOMY MODIFIED RADICAL Left 07/28/2016   Procedure: MASTECTOMY MODIFIED RADICAL;  Surgeon: [name], MD;  Location: [place];  Service: General;  Laterality: Left;  . PORT-A-CATH REMOVAL  03/13/2013  . PORT-A-CATH REMOVAL Left 03/13/2013   Procedure: REMOVAL PORT-A-CATH;  Surgeon: [name], MD;  Location: [place];  Service: General;  Laterality: Left;  . PORTACATH PLACEMENT  08/15/2012   Procedure: INSERTION PORT-A-CATH;  Surgeon: [name], MD;  Location: [place];  Service: General;  Laterality: Left;  . PORTACATH PLACEMENT N/A 01/09/2016   Procedure: INSERTION PORT-A-CATH;  Surgeon: [name], MD;  [facility];  Service: General;  Laterality: N/A;  . SKIN SPLIT GRAFT N/A 07/28/2016   Procedure: SKIN GRAFT SPLIT THICKNESS from left thigh to left chest;  Surgeon: Arnoldo Hooker  Iran Planas, MD;  Location: [place];  Service: Plastics;  Laterality: N/A;  . TUBAL LIGATION  1979    There were no vitals filed for this visit.      Subjective Assessment - 09/24/16 1304    Subjective I don't want to be bandaged today. I only want to come once a week. The bandages are uncomfortable.    Pertinent History Janice Powell noted a mass in her right breast sometime in 2011. 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 More recently her husband twisted her arm to get the mass looked at,  and she brought it to Dr. Pennie Banter attention. Diagnostic mammography and right ultrasonography at [facility] 03/02/2012 showed a 6.78 cm irregular mass in the inner aspect of the right breast. There was nipple retraction and erythema around the nipple. The largest lymph node was noted in the right axilla. By ultrasound the large irregular hypoechoic mass was noted in the breast with multiple enlarged axillary lymph nodes. Physical exam confirmed a hard breast with erythema around the right nipple extending inferiorly. The nipple is slightly inverted.  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   She underwent chemotherapy treatment for that.  2015 left adenopathy was noted; 2017 further left axillary and subpectoral adenopathy noted on scans and confirmed with biopsy.   Pt underwent chemotherapy and completed in November 2017. Pt underwent a left mastectomy on January 2nd and is unsure if there were lymph nodes removed. Pt states it was suggested that she complete radiation but pt does not plan to at this time.   HTN controlled with meds.     Patient Stated Goals for the swelling to go down   Currently in Pain? No/denies   Pain Score 0-No pain                         [facility] Adult PT Treatment/Exercise - 09/24/16 0001      Hand Exercises   MCPJ Extension AAROM;Left;5 reps   PIPJ Extension AAROM;Left;5 reps     Manual Therapy   Manual Lymphatic Drainage (MLD) In supine, 5 diaphragmatic breaths, then short neck, left groin and axillo-inguinal anastomosis, and left UE working proximal to distal and then retracing steps ( had to finish with pt in sitting position due to increased pain)                        Long Term Clinic Goals - 09/24/16 1305      CC Long Term Goal  #1   Title Patient and/or her husband will be able to perform manual lymph drainage independently   Baseline 09/24/16- pt  reports they were instructed but have not attempted this at home   Time 4   Period Weeks   Status On-going     CC Long Term Goal  #2   Title Patient will obtain appropriate compression garments for long term management of lymphedema   Time 4   Period Weeks   Status On-going     CC Long Term Goal  #3   Title Patient will demonstrate 120 degrees of left shoulder flexion to allow pt to reach items on shelves   Baseline 91, 09/24/16-87 degrees   Time 4   Period Weeks   Status On-going     CC Long Term Goal  #4  Title Pt will demonstrate 90 degrees of left shoulder abduction ROM to allow pt to return to prior level of function   Baseline 62, 09/24/16- 62 degrees   Time 4   Period Weeks   Status On-going     CC Long Term Goal  #5   Title Patient will be independent in a home exercise program for continued ROM and strengthening of bilateral shoulders   Time 4   Period Weeks   Status On-going            Plan - 09/24/16 1340    Clinical Impression Statement Pt arrived today without bandages intact. She did not bring bandages to her appointment today and requests she not be wrapped today. Patient also wanted to decrease to 1x/wk. Discussed with pt that we would be unable to do compression bandaging if she only comes 1x/wk and any gains we have made in therapy would be lost if she is unable to continue with compression bandaging. Pt agreed to continuing at 2x/wk and agreed to bring her bandages to her next session until she is ready for a compression garment. She had to leave session early today secondary to increased pain in her axilla. She states she forgot to take her pain meds prior to coming to her appt.    Clinical Impairments Affecting Rehab Potential metastatic breast cancer to left axillary nodes   PT Frequency 3x / week   PT Duration 4 weeks   PT Treatment/Interventions ADLs/Self Care Home Management;DME Instruction;Therapeutic exercise;Patient/family education;Orthotic  Fit/Training;Manual techniques;Manual lymph drainage;Compression bandaging;Passive range of motion;Taping;Scar mobilization   PT Next Visit Plan Follow up with velcro garment.  Continue to teach/reinforce  remedial UE ROM and finger extension exercise.    PT Home Exercise Plan remedial UE lymphedema exercises   Consulted and Agree with Plan of Care Patient      Patient will benefit from skilled therapeutic intervention in order to improve the following deficits and impairments:  Increased edema, Decreased range of motion, Decreased strength, Impaired UE functional use, Decreased knowledge of precautions, Decreased scar mobility, Postural dysfunction, Pain, Increased fascial restricitons, Decreased skin integrity  Visit Diagnosis: Postmastectomy lymphedema  Muscle weakness (generalized)     Problem List Patient Active Problem List   Diagnosis Date Noted  . Malignant neoplasm of overlapping sites of left breast in female, estrogen receptor negative ([place]) 08/27/2016  . Cancer related pain 08/27/2016  . SOB (shortness of breath) 07/24/2016  . Dehydration 07/24/2016  . Rash 07/17/2016  . Cellulitis 05/25/2016  . Recurrent cancer of right breast ([place]) 01/10/2016  . Lymphedema of arm 12/17/2015  . Breast mass, left 06/15/2015  . Atherosclerosis of aorta ([place]) 08/02/2014  . Breast cancer of upper-outer quadrant of right female breast ([place]) 03/31/2013  . Hypokalemia 12/13/2012  . Hypertension 07/04/2012    Allyson Sabal [facility] 09/24/2016, 1:45 PM  [place], [place], [address] Phone: [phone]   Fax:  [phone]  Name: AVALIA SLONECKER MRN: [id number] Date of Birth: June 29, 1946  Manus Gunning, PT 09/24/16 1:45 PM

## 2016-09-25 ENCOUNTER — Ambulatory Visit (HOSPITAL_BASED_OUTPATIENT_CLINIC_OR_DEPARTMENT_OTHER): Payer: Medicare Other | Admitting: Oncology

## 2016-09-25 VITALS — BP 128/54 | HR 100 | Temp 98.2°F | Resp 18 | Ht 63.5 in | Wt 122.7 lb

## 2016-09-25 DIAGNOSIS — Z171 Estrogen receptor negative status [ER-]: Secondary | ICD-10-CM | POA: Diagnosis not present

## 2016-09-25 DIAGNOSIS — C773 Secondary and unspecified malignant neoplasm of axilla and upper limb lymph nodes: Secondary | ICD-10-CM

## 2016-09-25 DIAGNOSIS — G893 Neoplasm related pain (acute) (chronic): Secondary | ICD-10-CM

## 2016-09-25 DIAGNOSIS — C50411 Malignant neoplasm of upper-outer quadrant of right female breast: Secondary | ICD-10-CM

## 2016-09-25 DIAGNOSIS — I890 Lymphedema, not elsewhere classified: Secondary | ICD-10-CM

## 2016-09-25 DIAGNOSIS — C50812 Malignant neoplasm of overlapping sites of left female breast: Secondary | ICD-10-CM

## 2016-09-25 DIAGNOSIS — C50911 Malignant neoplasm of unspecified site of right female breast: Secondary | ICD-10-CM

## 2016-09-25 MED ORDER — OXYCODONE HCL ER 40 MG PO T12A: 40.0000 mg | EXTENDED_RELEASE_TABLET | Freq: Two times a day (BID) | ORAL | 0 refills | Status: AC

## 2016-09-25 MED ORDER — OXYCODONE-ACETAMINOPHEN 5-325 MG PO TABS: 1.0000 | ORAL_TABLET | Freq: Three times a day (TID) | ORAL | 0 refills | Status: AC | PRN

## 2016-09-25 NOTE — Progress Notes (Signed)
Patient ID: Janice Powell, female   DOB: [birth year], 71 y.o.   MRN: [id number] ID: Janice Powell   DOB: 04/30/1946  MR#: [id number]  [id number]  PCP: Janice Pel, MD GYN:  Janice Skates MD OTHER MD: Janice Gibson, MD  CHIEF COMPLAINT:  Triple Negative Right Breast Cancer  CURRENT TREATMENT: observaton  INTERVAL HISTORY:  Janice Powell returns today for follow-up of her triple negative breast cancer. She tells me she is doing "great". She is undergoing rehabilitation for the left arm, with massage and wrapping, and she tells me once arm decreases maximally she will be fitted for a sleeve. She tells me her pain is "okay". Currently she is on OxyContin 40 mg twice daily and Percocet 2 tablets twice daily. She tells me she is not constipated, but has a bowel movement every other day, taking MiraLAX and stool softeners daily.  She is here today to again discuss how she wishes to proceed in management of her tumor.  REVIEW OF SYSTEMS: She tells me her appetite is good and her sense of taste is fine. However she has lost a couple of more pounds. She has "no other problems". Recall that for Janice Powell to acknowledge a problem is to make it worse, and she has made it clear that she only wants positive statements from her physician. A review of systems today was accordingly entirely negative per her report though she does tell me she does not like the scar on her left chest wall and that she is expecting this small area of dehiscence in the left axilla to a clear completely in the next few days.  HISTORY OF PRESENT ILLNESS: From the original intake note:  Janice Powell noted a mass in her right breast sometime in 2011. 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 More recently her husband twisted her arm to get the mass looked at, and she brought it to Dr. Pennie Banter attention. Diagnostic mammography and [facility] ultrason[facility] at [facility] 03/02/2012 showed a 6.78  cm irregular mass in the inner aspect of the right breast. There was nipple retraction and erythema around the nipple. The largest lymph node was noted in the right axilla. By ultrasound the large irregular hypoechoic mass was noted in the breast with multiple enlarged axillary lymph nodes. Physical exam confirmed a hard breast with erythema around the right nipple extending inferiorly. The nipple is slightly inverted.  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   Her subsequent history is as detailed below  PAST MEDICAL HISTORY: Past Medical History:  Diagnosis Date  . Breast cancer ([place]) 03/08/13   right breast - Invasive Ductal Carcinoma   . GERD (gastroesophageal reflux disease)    occ  . Hx antineoplastic chemotherapy    two cycles of neoadjuvant cyclophosphamide, docetaxel, doxorubicin ["TAC"] at standard doses with neulasta support; refused neulasta with cycles 2 and 4; tolerated treatment poorly (b) docetaxel was held for final 4 cycles; completed cycle 6 neoadjuvant chemotherapy 12/27/2012    . Hypertension   . Osteopenia   . Pneumonia    hx  . Primary malignant neoplasm of left breast with stage 2 nodal metastasis per American Joint Committee on Cancer 7th edition (N2) ([place]) 01/09/2016  . S/P radiation therapy    1) Right Chest Wall and IM nodes / 50 Gy in 25 fractions /2) Right Supraclavicular fossa / 50 Gy in 25  fractions/1) Right Chest Wall and IM nodes / 50 Gy in 25 fractions /2) Right Supraclavicular fossa / 50 Gy in 25 fractions/3) Right Posterior Axillary boost / 6.55 Gy in 25 fractions/4) Right Chest Wall Scar boost / 10 Gy in 5 fractions      . Wears glasses   GERD osteopenia  PAST SURGICAL HISTORY: Past Surgical History:  Procedure Laterality Date  . APPENDECTOMY  1992  . APPLICATION OF A-CELL OF EXTREMITY Left 07/28/2016   Procedure: APPLICATION OF A-CELL OF TO LEFT THIGH;  Surgeon:  Janice Limbo, MD;  Location: [place];  Service: Plastics;  Laterality: Left;  . APPLICATION OF WOUND VAC Left 07/28/2016   Procedure: APPLICATION OF WOUND VAC, left breast;  Surgeon: Janice Limbo, MD;  Location: [place];  Service: Plastics;  Laterality: Left;  . BREAST BIOPSY Right   . CHOLECYSTECTOMY  2000  . MASTECTOMY Right 03/13/2013  . MASTECTOMY MODIFIED RADICAL Right 03/13/2013   Procedure: MASTECTOMY MODIFIED RADICAL;  Surgeon: Janice Hector, MD;  Location: [place];  Service: General;  Laterality: Right;  . MASTECTOMY MODIFIED RADICAL Left 07/28/2016   Procedure: MASTECTOMY MODIFIED RADICAL;  Surgeon: Janice Skates, MD;  Location: [place];  Service: General;  Laterality: Left;  . PORT-A-CATH REMOVAL  03/13/2013  . PORT-A-CATH REMOVAL Left 03/13/2013   Procedure: REMOVAL PORT-A-CATH;  Surgeon: Janice Hector, MD;  Location: [place];  Service: General;  Laterality: Left;  . PORTACATH PLACEMENT  08/15/2012   Procedure: INSERTION PORT-A-CATH;  Surgeon: Janice Hector, MD;  Location: [place];  Service: General;  Laterality: Left;  . PORTACATH PLACEMENT N/A 01/09/2016   Procedure: INSERTION PORT-A-CATH;  Surgeon: Janice Skates, MD;  Location[facility];  Service: General;  Laterality: N/A;  . SKIN SPLIT GRAFT N/A 07/28/2016   Procedure: SKIN GRAFT SPLIT THICKNESS from left thigh to left chest;  Surgeon: Janice Limbo, MD;  Location: [place];  Service: Plastics;  Laterality: N/A;  . TUBAL LIGATION  1979    FAMILY HISTORY Family History  Problem Relation Age of Onset  . Heart disease Mother    the patient's father died in his sleep at age 35. The patient's mother died at the age of 31 from a myocardial infarction. The patient has one brother and 2 sisters, all surviving. There is no history of breast or ovarian cancer in the family.  GYNECOLOGIC HISTORY:  (Reviewed 12/07/2013) Menarche age 89, first live birth age 80, she is GX P5, menopause age 27. She did not take hormone replacement.  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 Her husband Janice Powell worked in the post office 50 years, and is also now retired. He is a former Company secretary. Son [name] lives in [place] and manages an [employer] store. Daughter Tamora Frazzini also lives in [place] and manages the IT Department at [employer]. Son Daphnie Katcher III is a Engineer, materials. Daughter [name] is a Actuary for [employer]. Son [name] is an Actuary. The patient has 8 grandchildren. She attends [employer]   ADVANCED DIRECTIVES: Not in place  HEALTH MAINTENANCE:  (Updated 12/07/2013) Social History  Substance Use Topics  . Smoking status: Never Smoker  . Smokeless tobacco: Never Used  . Alcohol use No     Colonoscopy: Not on file  PAP: Not on file  Bone density:  January 2011   Lipid panel:  Not on file/ Dr. Shelia Media  Allergies  Allergen Reactions  . Hydromorphone Nausea Only    Current Outpatient Prescriptions  Medication Sig Dispense Refill  . ibuprofen (ADVIL,MOTRIN) 600 MG tablet Take 1 tablet (600 mg total) by mouth every 6 (six) hours as needed. 100 tablet 3  . lisinopril (PRINIVIL,ZESTRIL) 10 MG tablet TAKE 1 TABLET EVERY DAY 90 tablet 1  . metoprolol succinate (TOPROL-XL) 50 MG 24 hr tablet TAKE 1 TABLET EVERY DAY WITH OR IMMEDIATELY FOLLOWING A MEAL 90 tablet 0  . oxyCODONE (OXYCONTIN) 40 mg 12 hr tablet Take 1 tablet (40 mg total) by mouth every 12 (twelve) hours. 30 tablet 0  . oxyCODONE-acetaminophen (PERCOCET/ROXICET) 5-325 MG tablet Take 1-2 tablets by mouth every 8 (eight) hours as needed for severe pain. 120 tablet 0  . potassium chloride (K-DUR) 10 MEQ tablet Take 1 tablet (10 mEq total) by mouth daily. (Patient taking differently: Take 10 mEq by mouth daily as needed (for fatigue/sluggish). ) 60 tablet 5  . triamterene-hydrochlorothiazide (MAXZIDE) 75-50 MG tablet Take 1 tablet by mouth daily. 90 tablet 6   No  current facility-administered medications for this visit.     OBJECTIVE: Middle-aged [place] American woman who appears frail  Vitals:   09/25/16 1329  BP: (!) 128/54  Pulse: 100  Resp: 18  Temp: 98.2 F (36.8 C)     Body mass index is 21.39 kg/m.    ECOG FS: 2 Filed Weights   09/25/16 1329  Weight: 122 lb 11.2 oz (55.7 kg)  Baseline weight May 2017 was 144 pounds  Sclerae unicteric, EOMs intact Oropharynx clear and moist Palpable left supraclavicular and cervical adenopathy, measuring currently approximately 2 cm, hard nodules Lungs no rales or rhonchi Heart regular rate and rhythm Abd soft, nontender, positive bowel sounds MSK grade 2 left upper extremity lymphedema, right Neuro: nonfocal, well oriented, designed affect Breasts: Deferred--patient does not wish to fully disrobe today   Left chest wall status post January 2018 mastectomy, photograph 08/21/2016     Area of concern in the right chest wall 06/05/2016      Photo of left breast 04/20/2016      LAB RESULTS: CBC    Component Value Date/Time   WBC 5.4 09/11/2016 1453   WBC 9.4 07/29/2016 0515   RBC 3.08 (L) 09/11/2016 1453   RBC 2.84 (L) 07/29/2016 0515   HGB 10.4 (L) 09/11/2016 1453   HCT 31.6 (L) 09/11/2016 1453   PLT 295 09/11/2016 1453   MCV 102.6 (H) 09/11/2016 1453   MCH 33.7 09/11/2016 1453   MCH 34.2 (H) 07/29/2016 0515   MCHC 32.9 09/11/2016 1453   MCHC 33.6 07/29/2016 0515   RDW 13.5 09/11/2016 1453   LYMPHSABS 0.8 (L) 09/11/2016 1453   MONOABS 0.8 09/11/2016 1453   EOSABS 0.0 09/11/2016 1453   BASOSABS 0.0 09/11/2016 1453    CMP     Component Value Date/Time   NA 132 (L) 09/11/2016 1453   K 4.2 09/11/2016 1453   CL 104 07/29/2016 0515   CL 107 01/11/2013 0853   CO2 26 09/11/2016 1453   GLUCOSE 107 09/11/2016 1453   GLUCOSE 123 (H) 01/11/2013 0853   BUN 26.0 09/11/2016 1453   CREATININE 1.7 (H) 09/11/2016 1453   CALCIUM 9.5 09/11/2016 1453   PROT 7.3 09/11/2016 1453    ALBUMIN 3.3 (L) 09/11/2016 1453   AST 13 09/11/2016 1453   ALT 7 09/11/2016 1453   ALKPHOS 52 09/11/2016 1453   BILITOT 0.38 09/11/2016 1453   GFRNONAA 45 (L) 07/29/2016 0515  GFRAA 52 (L) 07/29/2016 0515       STUDIES: No results found.  ASSESSMENT: 71 y.o. [place] woman   (1)  s/p Right breast upper outer quadrant and axillary node biopsy 03/08/2012 for a clinical T4, N1-2', stage IIIB invasive ductal carcinoma, grade 3, triple negative, with an MIB-1 of 93%.  (2) definitive staging and treatment delayed as patient canceled staging studies and tried alternative treatments; with eventual progression  (3) neoadjuvant chemotherapy started 08/23/12  (a) received two cycles of neoadjuvant cyclophosphamide, docetaxel, doxorubicin ["TAC"] at standard doses with neulasta support; refused neulasta with cycles 2 and 4; tolerated treatment poorly  (b) docetaxel was held for final 4 cycles; completed cycle 6 neoadjuvant chemotherapy 12/27/2012    (4) status post right modified radical mastectomy 03/13/2013 showing no residual carcinoma in the breast, but nine of 9 axillary lymph nodes sampled involved with macro metastases (ypTX, ypN2, stage IIIA)-- repeat prognostic panel again triple negative  (5) radiation therapy completed 05/31/2013  (6) patient opted against reconstruction  METASTATIC DISEASE: (7) left axillary adenopathy noted on mammography June 2015, confirmed on CT scan November 2015  (a) patient refused further workup or treatment on multiple visits (see notes)  (8) chest CT 12/16/2015 shows progressive left axillary and subpectoral adenopathy, left upper extremity lymphedema, left breast soft tissue fullness and skin thickening  (a) left axillary lymph node biopsy 12/26/2015 confirms metastatic carcinoma, triple negative  (9) started gemcitabine/carboplatin 01/13/2016, repeated day 1 and day 8 of each 21 cycle   (a) Day 8 cycle 2 held because of low counts  (b)  restaging CT scan of the chest 04/09/2016 after 6 cycles of chemotherapy shows evidence of mixed response  (c) treatments changed to every 2 weeks beginning with 04/20/2016 dose  (d) therapy stopped at patient's request after 05/20/2016 dose  (10) poorly controlled hypertension: Maxide added to lisinopril and metoprolol 04/13/2016. TTS-2 added November 2017  (11) left upper extremity lymphedema  (12) status post left total mastectomy 07/28/2016, again triple negative, with  1. Thoracoabdominal fasciocutaneous flap from left abdomen to left chest   2. Split thickness skin graft from left thigh to left chest 150 cm2   3. 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 Shelia Media. She is not angry or recent fall as she tells me about her decision, instead she is kindly and resigned.   We briefly reviewed her options as I see them, but she tells me she and her husband (who however was not present today) have made a definitive decision that she does not want any kind of chemotherapy, does not want to try immunotherapy with pembrolizumab and does not want palliative radiation. She also does not wish to be referred to in home palliative care, much less to full hospice at this point.  I went ahead and refilled her current pain medications (OxyContin 40 mg and Percocet) so that she would have enough to carry her through until she meets with Dr. Shelia Media. I offered to make her a return appointment here but she declines. She knows I will be glad to see her at any point in the future if I can be of any help    Chauncey Cruel, MD    09/25/2016

## 2016-09-26 ENCOUNTER — Encounter: Payer: Self-pay | Admitting: Oncology

## 2016-09-29 ENCOUNTER — Ambulatory Visit: Payer: Medicare Other | Admitting: Physical Therapy

## 2016-09-29 DIAGNOSIS — M25612 Stiffness of left shoulder, not elsewhere classified: Secondary | ICD-10-CM | POA: Diagnosis not present

## 2016-09-29 DIAGNOSIS — M25512 Pain in left shoulder: Secondary | ICD-10-CM | POA: Diagnosis not present

## 2016-09-29 DIAGNOSIS — I972 Postmastectomy lymphedema syndrome: Secondary | ICD-10-CM | POA: Diagnosis not present

## 2016-09-29 DIAGNOSIS — M6281 Muscle weakness (generalized): Secondary | ICD-10-CM | POA: Diagnosis not present

## 2016-09-29 NOTE — Therapy (Signed)
[place], [place], [address] Phone: [phone]   Fax:  [phone]  Physical Therapy Treatment  Patient Details  Name: Janice Powell MRN: [id number] Date of Birth: 02-14-1946 Referring Provider: Thimmappa  Encounter Date: 09/29/2016      PT End of Session - 09/29/16 1700    Visit Number 9   Number of Visits 21   Date for PT Re-Evaluation 10/30/16   PT Start Time [id number]   PT Stop Time 1502   PT Time Calculation (min) 42 min   Activity Tolerance Patient limited by pain   Behavior During Therapy [facility] for tasks assessed/performed      Past Medical History:  Diagnosis Date  . Breast cancer ([place]) 03/08/13   right breast - Invasive Ductal Carcinoma   . GERD (gastroesophageal reflux disease)    occ  . Hx antineoplastic chemotherapy    two cycles of neoadjuvant cyclophosphamide, docetaxel, doxorubicin ["TAC"] at standard doses with neulasta support; refused neulasta with cycles 2 and 4; tolerated treatment poorly (b) docetaxel was held for final 4 cycles; completed cycle 6 neoadjuvant chemotherapy 12/27/2012    . Hypertension   . Osteopenia   . Pneumonia    hx  . 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 S/P radiation therapy    1) Right Chest Wall and IM nodes / 50 Gy in 25 fractions /2) Right Supraclavicular fossa / 50 Gy in 25 fractions/1) Right Chest Wall and IM nodes / 50 Gy in 25 fractions /2) Right Supraclavicular fossa / 50 Gy in 25 fractions/3) Right Posterior Axillary boost / 6.55 Gy in 25 fractions/4) Right Chest Wall Scar boost / 10 Gy in 5 fractions      . Wears glasses     Past Surgical History:  Procedure Laterality Date  . APPENDECTOMY  1992  . APPLICATION OF A-CELL OF EXTREMITY Left 07/28/2016   Procedure: APPLICATION OF A-CELL OF TO LEFT THIGH;  Surgeon: Irene Limbo, MD;   Location: [place];  Service: Plastics;  Laterality: Left;  . APPLICATION OF WOUND VAC Left 07/28/2016   Procedure: APPLICATION OF WOUND VAC, left breast;  Surgeon: Irene Limbo, MD;  Location: [place];  Service: Plastics;  Laterality: Left;  . BREAST BIOPSY Right   . CHOLECYSTECTOMY  2000  . MASTECTOMY Right 03/13/2013  . MASTECTOMY MODIFIED RADICAL Right 03/13/2013   Procedure: MASTECTOMY MODIFIED RADICAL;  Surgeon: [name], MD;  Location: [place];  Service: General;  Laterality: Right;  . MASTECTOMY MODIFIED RADICAL Left 07/28/2016   Procedure: MASTECTOMY MODIFIED RADICAL;  Surgeon: [name], MD;  Location: [place];  Service: General;  Laterality: Left;  . PORT-A-CATH REMOVAL  03/13/2013  . PORT-A-CATH REMOVAL Left 03/13/2013   Procedure: REMOVAL PORT-A-CATH;  Surgeon: [name], MD;  Location: [place];  Service: General;  Laterality: Left;  . PORTACATH PLACEMENT  08/15/2012   Procedure: INSERTION PORT-A-CATH;  Surgeon: [name], MD;  Location: [place];  Service: General;  Laterality: Left;  . PORTACATH PLACEMENT N/A 01/09/2016   Procedure: INSERTION PORT-A-CATH;  Surgeon: [name], MD;  [facility];  Service: General;  Laterality: N/A;  . SKIN SPLIT GRAFT N/A 07/28/2016   Procedure: SKIN GRAFT SPLIT THICKNESS from left thigh to left chest;  Surgeon: Irene Limbo, MD;  Location: [place];  Service: Plastics;  Laterality: N/A;  . TUBAL LIGATION  1979    There were no vitals filed for this visit.      Subjective Assessment - 09/29/16 1422    Subjective "My husband doesn't want to pay that much (for the velcro garment), so you can just forget about that.   Currently in Pain? Yes   Pain Score 6    Pain Location Hand   Pain Orientation Left   Pain Descriptors / Indicators Aching   Aggravating Factors  nothing   Pain Relieving Factors exercising the hand with the ball               LYMPHEDEMA/ONCOLOGY QUESTIONNAIRE - 09/29/16 1428      Left Upper  Extremity Lymphedema   10 cm Proximal to Olecranon Process 41 cm  out of bandages for a week   Olecranon Process 34 cm   10 cm Proximal to Ulnar Styloid Process 30 cm   Just Proximal to Ulnar Styloid Process 22.9 cm   Across Hand at [employer] 21.6 cm   At [place] of 2nd Digit 6.7 cm  has been out of bandages a week                  [facility] Adult PT Treatment/Exercise - 09/29/16 0001      Manual Therapy   Manual Lymphatic Drainage (MLD) In supine, 5 diaphragmatic breaths, then short neck, left groin and axillo-inguinal anastomosis, and left UE working proximal to distal; unable to retrace steps today due to patient's c/o pain   Compression Bandaging to left UE:  lotion applied, then thick stockinette, Elastomull on all five fingers, Artiflex x 2 from hand to axilla, rectangle of 1/4 inch gray foam at antecubital fossa, and 1-6 cm., 1-8 cm., and 1-12 cm. short stretch bandage from hand to shoulder                        Long Term Clinic Goals - 09/29/16 1714      CC Long Term Goal  #1   Title Patient and/or her husband will be able to perform manual lymph drainage independently   Baseline 09/24/16- pt reports they were instructed but have not attempted this at home   Status On-going     CC Long Term Goal  #2   Title Patient will obtain appropriate compression garments for long term management of lymphedema   Status On-going     CC Long Term Goal  #3   Title Patient will demonstrate 120 degrees of left shoulder flexion to allow pt to reach items on shelves   Baseline 91, 09/24/16-87 degrees   Status On-going     CC Long Term Goal  #4   Title Pt will demonstrate 90 degrees of left shoulder abduction ROM to allow pt to return to prior level of function   Baseline 62, 09/24/16- 62 degrees   Status On-going     CC Long Term Goal  #5   Title Patient will be independent in a home exercise program for continued ROM and strengthening of bilateral shoulders   Status  On-going            Plan - 09/29/16 1708    Clinical Impression Statement Patient has been out of bandages for a week, and her arm circumference measurements indicate a definite increase in swelling. Forearm edema is pitting, and left arm in general is very indurated.  She has significant pain in the left axilla area that limits her tolerance even  for gentle manual lymph drainage, in part because it seems difficult for her to tolerate staying in one position for 20 minutes.  She reports that her husband doesn't want to spend the money for a velcro compression garment that she can get on and off, and she is skeptical that her insurance will cover it.  We are looking into this issue.  Pt. was encouraged to work on elbow extension to avoid a contracture.   Rehab Potential Fair   Clinical Impairments Affecting Rehab Potential metastatic breast cancer to left axillary nodes   PT Frequency 3x / week   PT Duration 4 weeks   PT Treatment/Interventions ADLs/Self Care Home Management;DME Instruction;Therapeutic exercise;Patient/family education;Orthotic Fit/Training;Manual techniques;Manual lymph drainage;Compression bandaging;Passive range of motion;Taping;Scar mobilization   PT Next Visit Plan Follow up with velcro garment, either through [facility] or possible through the [facility] rep and educator..  Continue to teach/reinforce remedial UE ROM and finger extension exercise as well as elbow extension.  Continue complete decongestive therapy until a garment can be obtained.   Consulted and Agree with Plan of Care Patient      Patient will benefit from skilled therapeutic intervention in order to improve the following deficits and impairments:  Increased edema, Decreased range of motion, Decreased strength, Impaired UE functional use, Decreased knowledge of precautions, Decreased scar mobility, Postural dysfunction, Pain, Increased fascial restricitons, Decreased skin integrity  Visit  Diagnosis: Postmastectomy lymphedema - Plan: PT plan of care cert/re-cert  Muscle weakness (generalized) - Plan: PT plan of care cert/re-cert  Stiffness of left shoulder, not elsewhere classified - Plan: PT plan of care cert/re-cert  Acute pain of left shoulder - Plan: PT plan of care cert/re-cert     Problem List Patient Active Problem List   Diagnosis Date Noted  . Malignant neoplasm of overlapping sites of left breast in female, estrogen receptor negative ([place]) 08/27/2016  . Cancer related pain 08/27/2016  . SOB (shortness of breath) 07/24/2016  . Dehydration 07/24/2016  . Rash 07/17/2016  . Cellulitis 05/25/2016  . Recurrent cancer of right breast ([place]) 01/10/2016  . Lymphedema of arm 12/17/2015  . Breast mass, left 06/15/2015  . Atherosclerosis of aorta ([place]) 08/02/2014  . Breast cancer of upper-outer quadrant of right female breast ([place]) 03/31/2013  . Hypokalemia 12/13/2012  . Hypertension 07/04/2012    SALISBURY,DONNA 09/29/2016, 5:18 PM  [place], [place], [address] Phone: [phone]   Fax:  [phone]  Name: KARN BARONA MRN: [id number] Date of Birth: [birth year]

## 2016-09-30 DIAGNOSIS — R918 Other nonspecific abnormal finding of lung field: Secondary | ICD-10-CM | POA: Diagnosis not present

## 2016-09-30 DIAGNOSIS — C50919 Malignant neoplasm of unspecified site of unspecified female breast: Secondary | ICD-10-CM | POA: Diagnosis not present

## 2016-10-06 ENCOUNTER — Ambulatory Visit: Payer: Medicare Other | Admitting: Physical Therapy

## 2016-10-08 ENCOUNTER — Ambulatory Visit: Payer: Medicare Other

## 2016-10-08 DIAGNOSIS — I972 Postmastectomy lymphedema syndrome: Secondary | ICD-10-CM

## 2016-10-08 DIAGNOSIS — M6281 Muscle weakness (generalized): Secondary | ICD-10-CM | POA: Diagnosis not present

## 2016-10-08 DIAGNOSIS — M25512 Pain in left shoulder: Secondary | ICD-10-CM

## 2016-10-08 DIAGNOSIS — M25612 Stiffness of left shoulder, not elsewhere classified: Secondary | ICD-10-CM | POA: Diagnosis not present

## 2016-10-08 NOTE — Therapy (Addendum)
[place], [place], [address] Phone: [phone]   Fax:  [phone]  Physical Therapy Treatment  Patient Details  Name: Janice Powell MRN: [id number] Date of Birth: 07-19-1946 Referring Provider: Thimmappa  Encounter Date: 10/08/2016      PT End of Session - 10/08/16 1209    Visit Number 10   Number of Visits 21   Date for PT Re-Evaluation 10/30/16   PT Start Time 1111  Pt arrived late   PT Stop Time 1158   PT Time Calculation (min) 47 min   Activity Tolerance Patient tolerated treatment well   Behavior During Therapy [facility] for tasks assessed/performed      Past Medical History:  Diagnosis Date  . Breast cancer ([place]) 03/08/13   right breast - Invasive Ductal Carcinoma   . GERD (gastroesophageal reflux disease)    occ  . Hx antineoplastic chemotherapy    two cycles of neoadjuvant cyclophosphamide, docetaxel, doxorubicin ["TAC"] at standard doses with neulasta support; refused neulasta with cycles 2 and 4; tolerated treatment poorly (b) docetaxel was held for final 4 cycles; completed cycle 6 neoadjuvant chemotherapy 12/27/2012    . Hypertension   . Osteopenia   . Pneumonia    hx  . 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 S/P radiation therapy    1) Right Chest Wall and IM nodes / 50 Gy in 25 fractions /2) Right Supraclavicular fossa / 50 Gy in 25 fractions/1) Right Chest Wall and IM nodes / 50 Gy in 25 fractions /2) Right Supraclavicular fossa / 50 Gy in 25 fractions/3) Right Posterior Axillary boost / 6.55 Gy in 25 fractions/4) Right Chest Wall Scar boost / 10 Gy in 5 fractions      . Wears glasses     Past Surgical History:  Procedure Laterality Date  . APPENDECTOMY  1992  . APPLICATION OF A-CELL OF EXTREMITY Left 07/28/2016   Procedure: APPLICATION OF A-CELL OF TO LEFT THIGH;  Surgeon:  Irene Limbo, MD;  Location: [place];  Service: Plastics;  Laterality: Left;  . APPLICATION OF WOUND VAC Left 07/28/2016   Procedure: APPLICATION OF WOUND VAC, left breast;  Surgeon: Irene Limbo, MD;  Location: [place];  Service: Plastics;  Laterality: Left;  . BREAST BIOPSY Right   . CHOLECYSTECTOMY  2000  . MASTECTOMY Right 03/13/2013  . MASTECTOMY MODIFIED RADICAL Right 03/13/2013   Procedure: MASTECTOMY MODIFIED RADICAL;  Surgeon: [name], MD;  Location: [place];  Service: General;  Laterality: Right;  . MASTECTOMY MODIFIED RADICAL Left 07/28/2016   Procedure: MASTECTOMY MODIFIED RADICAL;  Surgeon: [name], MD;  Location: [place];  Service: General;  Laterality: Left;  . PORT-A-CATH REMOVAL  03/13/2013  . PORT-A-CATH REMOVAL Left 03/13/2013   Procedure: REMOVAL PORT-A-CATH;  Surgeon: [name], MD;  Location: [place];  Service: General;  Laterality: Left;  . PORTACATH PLACEMENT  08/15/2012   Procedure: INSERTION PORT-A-CATH;  Surgeon: [name], MD;  Location: [place];  Service: General;  Laterality: Left;  . PORTACATH PLACEMENT N/A 01/09/2016   Procedure: INSERTION PORT-A-CATH;  Surgeon: [name], MD;  [facility];  Service: General;  Laterality: N/A;  . SKIN SPLIT GRAFT N/A 07/28/2016   Procedure: SKIN GRAFT SPLIT THICKNESS from left thigh to left chest;  Surgeon: Irene Limbo, MD;  Location: [place];  Service: Plastics;  Laterality: N/A;  .  TUBAL LIGATION  1979    There were no vitals filed for this visit.      Subjective Assessment - 10/08/16 1116    Subjective I was measured Tuesday for my garment and that should arrive in about 14 days. I forgot my bandages today and I don't think they would fit under my shirt anyways.   Pertinent History Ms. Thomure noted a mass in her right breast sometime in 2011. 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 More recently her husband twisted her arm to get the mass looked  at, and she brought it to Dr. Pennie Banter attention. Diagnostic mammography and right ultrasonography at [facility] 03/02/2012 showed a 6.78 cm irregular mass in the inner aspect of the right breast. There was nipple retraction and erythema around the nipple. The largest lymph node was noted in the right axilla. By ultrasound the large irregular hypoechoic mass was noted in the breast with multiple enlarged axillary lymph nodes. Physical exam confirmed a hard breast with erythema around the right nipple extending inferiorly. The nipple is slightly inverted.  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   She underwent chemotherapy treatment for that.  2015 left adenopathy was noted; 2017 further left axillary and subpectoral adenopathy noted on scans and confirmed with biopsy.   Pt underwent chemotherapy and completed in November 2017. Pt underwent a left mastectomy on January 2nd and is unsure if there were lymph nodes removed. Pt states it was suggested that she complete radiation but pt does not plan to at this time.   HTN controlled with meds.     Patient Stated Goals for the swelling to go down   Currently in Pain? No/denies               LYMPHEDEMA/ONCOLOGY QUESTIONNAIRE - 10/08/16 1118      Left Upper Extremity Lymphedema   10 cm Proximal to Olecranon Process 41.5 cm   Olecranon Process 35.5 cm   10 cm Proximal to Ulnar Styloid Process 30.1 cm   Just Proximal to Ulnar Styloid Process 22.1 cm   Across Hand at [employer] 20.6 cm   At [place] of 2nd Digit 6.9 cm                  [facility] Adult PT Treatment/Exercise - 10/08/16 0001      Manual Therapy   Manual Lymphatic Drainage (MLD) In supine, 5 diaphragmatic breaths, then short neck, left groin and axillo-inguinal anastomosis, and left UE working proximal to distal; unable to retrace steps today due to patient's c/o pain   Compression Bandaging Unable  to bandage pts arm today as it would not fit back into her shirt but did apply TG soft and issued a second one for her to try wearing them at the same time after she got home and out of tight shirt, and Elastomull to all fingers.                        Long Term Clinic Goals - 09/29/16 1714      CC Long Term Goal  #1   Title Patient and/or her husband will be able to perform manual lymph drainage independently   Baseline 09/24/16- pt reports they were instructed but have not attempted this at home   Status On-going     [place]  Goal  #2   Title Patient will obtain appropriate compression garments for long term management of lymphedema   Status On-going     CC Long Term Goal  #3   Title Patient will demonstrate 120 degrees of left shoulder flexion to allow pt to reach items on shelves   Baseline 91, 09/24/16-87 degrees   Status On-going     CC Long Term Goal  #4   Title Pt will demonstrate 90 degrees of left shoulder abduction ROM to allow pt to return to prior level of function   Baseline 62, 09/24/16- 62 degrees   Status On-going     CC Long Term Goal  #5   Title Patient will be independent in a home exercise program for continued ROM and strengthening of bilateral shoulders   Status On-going            Plan - 10-25-2016 1210    Clinical Impression Statement Pt continues with some reports of discomfort in her Lt UE, mostly shoulder and axilla, during treatment but was able to lay for duration of session. Her circumference measurements were mostly unchanged from last wekk with some reductions. Pt has been wearing her TG soft sleeve every day and reports this gives her some comfort so issued a second one today and instructed her and her husband to try wearing them together since we were unable to bandage arm today due to clothing. Was able to bandage her fingers and instructed pt to leave this on as long as possible.  Pt was measured by [employer] for a compression garment  Tuesday and was told it would take about 2 weeks to arrive.   Rehab Potential Fair   Clinical Impairments Affecting Rehab Potential metastatic breast cancer to left axillary nodes   PT Frequency 3x / week   PT Duration 4 weeks   PT Treatment/Interventions ADLs/Self Care Home Management;DME Instruction;Therapeutic exercise;Patient/family education;Orthotic Fit/Training;Manual techniques;Manual lymph drainage;Compression bandaging;Passive range of motion;Taping;Scar mobilization   PT Next Visit Plan Continue to teach/reinforce remedial UE ROM and finger extension exercise as well as elbow extension.  Continue complete decongestive therapy until garment arrives.   Consulted and Agree with Plan of Care Patient      Patient will benefit from skilled therapeutic intervention in order to improve the following deficits and impairments:  Increased edema, Decreased range of motion, Decreased strength, Impaired UE functional use, Decreased knowledge of precautions, Decreased scar mobility, Postural dysfunction, Pain, Increased fascial restricitons, Decreased skin integrity  Visit Diagnosis: Postmastectomy lymphedema  Acute pain of left shoulder       G-Codes - 10-25-2016 1326    Functional Assessment Tool Used (Outpatient Only) clinical judgement   Functional Limitation Self care   Self Care Current Status (K2157) At least 40 percent but less than 60 percent impaired, limited or restricted   Self Care Goal Status (Y4615) At least 1 percent but less than 20 percent impaired, limited or restricted      Problem List Patient Active Problem List   Diagnosis Date Noted  . Malignant neoplasm of overlapping sites of left breast in female, estrogen receptor negative ([facility]) 08/27/2016  . Cancer related pain 08/27/2016  . SOB (shortness of breath) 07/24/2016  . Dehydration 07/24/2016  . Rash 07/17/2016  . Cellulitis 05/25/2016  . Recurrent cancer of right breast ([facility]) 01/10/2016  . Lymphedema of arm  12/17/2015  . Breast mass, left 06/15/2015  . Atherosclerosis of aorta ([facility]) 08/02/2014  . Breast cancer of upper-outer quadrant of right female  breast ([place]) 03/31/2013  . Hypokalemia 12/13/2012  . Hypertension 07/04/2012    SALISBURY,DONNA, PTA 10/08/2016, 1:28 PM  [place], [place], [address] Phone: [phone]   Fax:  [phone]  Name: MASIYAH JORSTAD MRN: [id number] Date of Birth: April 30, 1946  Serafina Royals, PT 10/08/16 1:28 PM  PHYSICAL THERAPY DISCHARGE SUMMARY  Visits from Start of Care: 10  Current functional level related to goals / functional outcomes: See above   Remaining deficits: See above   Education / Equipment: See above  Plan: Patient agrees to discharge.  Patient goals were not met. Patient is being discharged due to not returning since the last visit.  ?????    Allyson Sabal [place], [place] 08/26/17 4:40 PM

## 2016-10-13 ENCOUNTER — Ambulatory Visit: Payer: Medicare Other

## 2016-10-19 ENCOUNTER — Other Ambulatory Visit: Payer: Self-pay | Admitting: *Deleted

## 2016-10-21 DIAGNOSIS — C50912 Malignant neoplasm of unspecified site of left female breast: Secondary | ICD-10-CM | POA: Diagnosis not present

## 2016-10-21 DIAGNOSIS — C773 Secondary and unspecified malignant neoplasm of axilla and upper limb lymph nodes: Secondary | ICD-10-CM | POA: Diagnosis not present

## 2016-10-21 DIAGNOSIS — Z171 Estrogen receptor negative status [ER-]: Secondary | ICD-10-CM | POA: Diagnosis not present

## 2016-10-21 DIAGNOSIS — C50911 Malignant neoplasm of unspecified site of right female breast: Secondary | ICD-10-CM | POA: Diagnosis not present

## 2016-10-22 ENCOUNTER — Ambulatory Visit: Payer: Medicare Other | Admitting: Physical Therapy

## 2016-10-22 DIAGNOSIS — I160 Hypertensive urgency: Secondary | ICD-10-CM | POA: Diagnosis not present

## 2016-10-22 DIAGNOSIS — C50912 Malignant neoplasm of unspecified site of left female breast: Secondary | ICD-10-CM | POA: Diagnosis not present

## 2016-10-22 DIAGNOSIS — I959 Hypotension, unspecified: Secondary | ICD-10-CM | POA: Diagnosis not present

## 2016-10-22 DIAGNOSIS — C50919 Malignant neoplasm of unspecified site of unspecified female breast: Secondary | ICD-10-CM | POA: Diagnosis not present

## 2016-10-22 DIAGNOSIS — N179 Acute kidney failure, unspecified: Secondary | ICD-10-CM | POA: Diagnosis not present

## 2016-10-26 ENCOUNTER — Telehealth: Payer: Self-pay | Admitting: *Deleted

## 2016-10-29 DIAGNOSIS — C50919 Malignant neoplasm of unspecified site of unspecified female breast: Secondary | ICD-10-CM | POA: Diagnosis not present

## 2016-10-29 DIAGNOSIS — I10 Essential (primary) hypertension: Secondary | ICD-10-CM | POA: Diagnosis not present

## 2016-10-29 DIAGNOSIS — R000 Tachycardia, unspecified: Secondary | ICD-10-CM | POA: Diagnosis not present

## 2016-10-29 DIAGNOSIS — C799 Secondary malignant neoplasm of unspecified site: Secondary | ICD-10-CM | POA: Diagnosis not present

## 2016-10-29 DIAGNOSIS — I959 Hypotension, unspecified: Secondary | ICD-10-CM | POA: Diagnosis not present

## 2016-10-29 DIAGNOSIS — D630 Anemia in neoplastic disease: Secondary | ICD-10-CM | POA: Diagnosis not present

## 2016-10-29 DIAGNOSIS — R634 Abnormal weight loss: Secondary | ICD-10-CM | POA: Diagnosis not present

## 2016-10-30 DIAGNOSIS — C799 Secondary malignant neoplasm of unspecified site: Secondary | ICD-10-CM | POA: Diagnosis not present

## 2016-10-30 DIAGNOSIS — D630 Anemia in neoplastic disease: Secondary | ICD-10-CM | POA: Diagnosis not present

## 2016-10-30 DIAGNOSIS — C50919 Malignant neoplasm of unspecified site of unspecified female breast: Secondary | ICD-10-CM | POA: Diagnosis not present

## 2016-10-30 DIAGNOSIS — R634 Abnormal weight loss: Secondary | ICD-10-CM | POA: Diagnosis not present

## 2016-10-30 DIAGNOSIS — I10 Essential (primary) hypertension: Secondary | ICD-10-CM | POA: Diagnosis not present

## 2016-11-01 DIAGNOSIS — I10 Essential (primary) hypertension: Secondary | ICD-10-CM | POA: Diagnosis not present

## 2016-11-01 DIAGNOSIS — C799 Secondary malignant neoplasm of unspecified site: Secondary | ICD-10-CM | POA: Diagnosis not present

## 2016-11-01 DIAGNOSIS — C50919 Malignant neoplasm of unspecified site of unspecified female breast: Secondary | ICD-10-CM | POA: Diagnosis not present

## 2016-11-01 DIAGNOSIS — D630 Anemia in neoplastic disease: Secondary | ICD-10-CM | POA: Diagnosis not present

## 2016-11-01 DIAGNOSIS — R634 Abnormal weight loss: Secondary | ICD-10-CM | POA: Diagnosis not present

## 2016-11-02 DIAGNOSIS — D630 Anemia in neoplastic disease: Secondary | ICD-10-CM | POA: Diagnosis not present

## 2016-11-02 DIAGNOSIS — C799 Secondary malignant neoplasm of unspecified site: Secondary | ICD-10-CM | POA: Diagnosis not present

## 2016-11-02 DIAGNOSIS — R634 Abnormal weight loss: Secondary | ICD-10-CM | POA: Diagnosis not present

## 2016-11-02 DIAGNOSIS — C50919 Malignant neoplasm of unspecified site of unspecified female breast: Secondary | ICD-10-CM | POA: Diagnosis not present

## 2016-11-02 DIAGNOSIS — I10 Essential (primary) hypertension: Secondary | ICD-10-CM | POA: Diagnosis not present

## 2016-11-04 DIAGNOSIS — R634 Abnormal weight loss: Secondary | ICD-10-CM | POA: Diagnosis not present

## 2016-11-04 DIAGNOSIS — I10 Essential (primary) hypertension: Secondary | ICD-10-CM | POA: Diagnosis not present

## 2016-11-04 DIAGNOSIS — C799 Secondary malignant neoplasm of unspecified site: Secondary | ICD-10-CM | POA: Diagnosis not present

## 2016-11-04 DIAGNOSIS — C50919 Malignant neoplasm of unspecified site of unspecified female breast: Secondary | ICD-10-CM | POA: Diagnosis not present

## 2016-11-04 DIAGNOSIS — D630 Anemia in neoplastic disease: Secondary | ICD-10-CM | POA: Diagnosis not present

## 2016-11-05 DIAGNOSIS — C50919 Malignant neoplasm of unspecified site of unspecified female breast: Secondary | ICD-10-CM | POA: Diagnosis not present

## 2016-11-05 DIAGNOSIS — D630 Anemia in neoplastic disease: Secondary | ICD-10-CM | POA: Diagnosis not present

## 2016-11-05 DIAGNOSIS — R634 Abnormal weight loss: Secondary | ICD-10-CM | POA: Diagnosis not present

## 2016-11-05 DIAGNOSIS — C799 Secondary malignant neoplasm of unspecified site: Secondary | ICD-10-CM | POA: Diagnosis not present

## 2016-11-05 DIAGNOSIS — I10 Essential (primary) hypertension: Secondary | ICD-10-CM | POA: Diagnosis not present

## 2016-11-09 DIAGNOSIS — I10 Essential (primary) hypertension: Secondary | ICD-10-CM | POA: Diagnosis not present

## 2016-11-09 DIAGNOSIS — D630 Anemia in neoplastic disease: Secondary | ICD-10-CM | POA: Diagnosis not present

## 2016-11-09 DIAGNOSIS — C50919 Malignant neoplasm of unspecified site of unspecified female breast: Secondary | ICD-10-CM | POA: Diagnosis not present

## 2016-11-09 DIAGNOSIS — R634 Abnormal weight loss: Secondary | ICD-10-CM | POA: Diagnosis not present

## 2016-11-09 DIAGNOSIS — C799 Secondary malignant neoplasm of unspecified site: Secondary | ICD-10-CM | POA: Diagnosis not present

## 2016-11-11 DIAGNOSIS — C50919 Malignant neoplasm of unspecified site of unspecified female breast: Secondary | ICD-10-CM | POA: Diagnosis not present

## 2016-11-11 DIAGNOSIS — I10 Essential (primary) hypertension: Secondary | ICD-10-CM | POA: Diagnosis not present

## 2016-11-11 DIAGNOSIS — D630 Anemia in neoplastic disease: Secondary | ICD-10-CM | POA: Diagnosis not present

## 2016-11-11 DIAGNOSIS — R634 Abnormal weight loss: Secondary | ICD-10-CM | POA: Diagnosis not present

## 2016-11-11 DIAGNOSIS — C799 Secondary malignant neoplasm of unspecified site: Secondary | ICD-10-CM | POA: Diagnosis not present

## 2016-11-12 DIAGNOSIS — D630 Anemia in neoplastic disease: Secondary | ICD-10-CM | POA: Diagnosis not present

## 2016-11-12 DIAGNOSIS — R634 Abnormal weight loss: Secondary | ICD-10-CM | POA: Diagnosis not present

## 2016-11-12 DIAGNOSIS — I10 Essential (primary) hypertension: Secondary | ICD-10-CM | POA: Diagnosis not present

## 2016-11-12 DIAGNOSIS — C50919 Malignant neoplasm of unspecified site of unspecified female breast: Secondary | ICD-10-CM | POA: Diagnosis not present

## 2016-11-12 DIAGNOSIS — C799 Secondary malignant neoplasm of unspecified site: Secondary | ICD-10-CM | POA: Diagnosis not present

## 2016-11-13 DIAGNOSIS — D630 Anemia in neoplastic disease: Secondary | ICD-10-CM | POA: Diagnosis not present

## 2016-11-13 DIAGNOSIS — C799 Secondary malignant neoplasm of unspecified site: Secondary | ICD-10-CM | POA: Diagnosis not present

## 2016-11-13 DIAGNOSIS — I10 Essential (primary) hypertension: Secondary | ICD-10-CM | POA: Diagnosis not present

## 2016-11-13 DIAGNOSIS — R634 Abnormal weight loss: Secondary | ICD-10-CM | POA: Diagnosis not present

## 2016-11-13 DIAGNOSIS — C50919 Malignant neoplasm of unspecified site of unspecified female breast: Secondary | ICD-10-CM | POA: Diagnosis not present

## 2016-11-16 DIAGNOSIS — I10 Essential (primary) hypertension: Secondary | ICD-10-CM | POA: Diagnosis not present

## 2016-11-16 DIAGNOSIS — C50919 Malignant neoplasm of unspecified site of unspecified female breast: Secondary | ICD-10-CM | POA: Diagnosis not present

## 2016-11-16 DIAGNOSIS — R634 Abnormal weight loss: Secondary | ICD-10-CM | POA: Diagnosis not present

## 2016-11-16 DIAGNOSIS — C799 Secondary malignant neoplasm of unspecified site: Secondary | ICD-10-CM | POA: Diagnosis not present

## 2016-11-16 DIAGNOSIS — D630 Anemia in neoplastic disease: Secondary | ICD-10-CM | POA: Diagnosis not present

## 2016-11-17 DIAGNOSIS — C799 Secondary malignant neoplasm of unspecified site: Secondary | ICD-10-CM | POA: Diagnosis not present

## 2016-11-17 DIAGNOSIS — I10 Essential (primary) hypertension: Secondary | ICD-10-CM | POA: Diagnosis not present

## 2016-11-17 DIAGNOSIS — C50919 Malignant neoplasm of unspecified site of unspecified female breast: Secondary | ICD-10-CM | POA: Diagnosis not present

## 2016-11-17 DIAGNOSIS — R634 Abnormal weight loss: Secondary | ICD-10-CM | POA: Diagnosis not present

## 2016-11-17 DIAGNOSIS — D630 Anemia in neoplastic disease: Secondary | ICD-10-CM | POA: Diagnosis not present

## 2016-11-18 DIAGNOSIS — R634 Abnormal weight loss: Secondary | ICD-10-CM | POA: Diagnosis not present

## 2016-11-18 DIAGNOSIS — C50919 Malignant neoplasm of unspecified site of unspecified female breast: Secondary | ICD-10-CM | POA: Diagnosis not present

## 2016-11-18 DIAGNOSIS — C799 Secondary malignant neoplasm of unspecified site: Secondary | ICD-10-CM | POA: Diagnosis not present

## 2016-11-18 DIAGNOSIS — I10 Essential (primary) hypertension: Secondary | ICD-10-CM | POA: Diagnosis not present

## 2016-11-18 DIAGNOSIS — D630 Anemia in neoplastic disease: Secondary | ICD-10-CM | POA: Diagnosis not present

## 2016-11-19 DIAGNOSIS — I10 Essential (primary) hypertension: Secondary | ICD-10-CM | POA: Diagnosis not present

## 2016-11-19 DIAGNOSIS — R634 Abnormal weight loss: Secondary | ICD-10-CM | POA: Diagnosis not present

## 2016-11-19 DIAGNOSIS — C799 Secondary malignant neoplasm of unspecified site: Secondary | ICD-10-CM | POA: Diagnosis not present

## 2016-11-19 DIAGNOSIS — D630 Anemia in neoplastic disease: Secondary | ICD-10-CM | POA: Diagnosis not present

## 2016-11-19 DIAGNOSIS — C50919 Malignant neoplasm of unspecified site of unspecified female breast: Secondary | ICD-10-CM | POA: Diagnosis not present

## 2016-11-20 DIAGNOSIS — I10 Essential (primary) hypertension: Secondary | ICD-10-CM | POA: Diagnosis not present

## 2016-11-20 DIAGNOSIS — C50919 Malignant neoplasm of unspecified site of unspecified female breast: Secondary | ICD-10-CM | POA: Diagnosis not present

## 2016-11-20 DIAGNOSIS — R634 Abnormal weight loss: Secondary | ICD-10-CM | POA: Diagnosis not present

## 2016-11-20 DIAGNOSIS — C799 Secondary malignant neoplasm of unspecified site: Secondary | ICD-10-CM | POA: Diagnosis not present

## 2016-11-20 DIAGNOSIS — D630 Anemia in neoplastic disease: Secondary | ICD-10-CM | POA: Diagnosis not present

## 2016-11-23 DIAGNOSIS — C50919 Malignant neoplasm of unspecified site of unspecified female breast: Secondary | ICD-10-CM | POA: Diagnosis not present

## 2016-11-23 DIAGNOSIS — C799 Secondary malignant neoplasm of unspecified site: Secondary | ICD-10-CM | POA: Diagnosis not present

## 2016-11-23 DIAGNOSIS — R634 Abnormal weight loss: Secondary | ICD-10-CM | POA: Diagnosis not present

## 2016-11-23 DIAGNOSIS — D630 Anemia in neoplastic disease: Secondary | ICD-10-CM | POA: Diagnosis not present

## 2016-11-23 DIAGNOSIS — I10 Essential (primary) hypertension: Secondary | ICD-10-CM | POA: Diagnosis not present

## 2016-11-24 DIAGNOSIS — D630 Anemia in neoplastic disease: Secondary | ICD-10-CM | POA: Diagnosis not present

## 2016-11-24 DIAGNOSIS — R634 Abnormal weight loss: Secondary | ICD-10-CM | POA: Diagnosis not present

## 2016-11-24 DIAGNOSIS — C799 Secondary malignant neoplasm of unspecified site: Secondary | ICD-10-CM | POA: Diagnosis not present

## 2016-11-24 DIAGNOSIS — C50919 Malignant neoplasm of unspecified site of unspecified female breast: Secondary | ICD-10-CM | POA: Diagnosis not present

## 2016-11-24 DIAGNOSIS — I10 Essential (primary) hypertension: Secondary | ICD-10-CM | POA: Diagnosis not present

## 2016-11-26 DIAGNOSIS — D630 Anemia in neoplastic disease: Secondary | ICD-10-CM | POA: Diagnosis not present

## 2016-11-26 DIAGNOSIS — C799 Secondary malignant neoplasm of unspecified site: Secondary | ICD-10-CM | POA: Diagnosis not present

## 2016-11-26 DIAGNOSIS — R634 Abnormal weight loss: Secondary | ICD-10-CM | POA: Diagnosis not present

## 2016-11-26 DIAGNOSIS — I10 Essential (primary) hypertension: Secondary | ICD-10-CM | POA: Diagnosis not present

## 2016-11-26 DIAGNOSIS — C50919 Malignant neoplasm of unspecified site of unspecified female breast: Secondary | ICD-10-CM | POA: Diagnosis not present

## 2016-11-27 DIAGNOSIS — D630 Anemia in neoplastic disease: Secondary | ICD-10-CM | POA: Diagnosis not present

## 2016-11-27 DIAGNOSIS — C799 Secondary malignant neoplasm of unspecified site: Secondary | ICD-10-CM | POA: Diagnosis not present

## 2016-11-27 DIAGNOSIS — C50919 Malignant neoplasm of unspecified site of unspecified female breast: Secondary | ICD-10-CM | POA: Diagnosis not present

## 2016-11-27 DIAGNOSIS — I10 Essential (primary) hypertension: Secondary | ICD-10-CM | POA: Diagnosis not present

## 2016-11-27 DIAGNOSIS — R634 Abnormal weight loss: Secondary | ICD-10-CM | POA: Diagnosis not present

## 2016-11-30 DIAGNOSIS — C50919 Malignant neoplasm of unspecified site of unspecified female breast: Secondary | ICD-10-CM | POA: Diagnosis not present

## 2016-11-30 DIAGNOSIS — D630 Anemia in neoplastic disease: Secondary | ICD-10-CM | POA: Diagnosis not present

## 2016-11-30 DIAGNOSIS — C799 Secondary malignant neoplasm of unspecified site: Secondary | ICD-10-CM | POA: Diagnosis not present

## 2016-11-30 DIAGNOSIS — R634 Abnormal weight loss: Secondary | ICD-10-CM | POA: Diagnosis not present

## 2016-11-30 DIAGNOSIS — I10 Essential (primary) hypertension: Secondary | ICD-10-CM | POA: Diagnosis not present

## 2016-12-01 DIAGNOSIS — C799 Secondary malignant neoplasm of unspecified site: Secondary | ICD-10-CM | POA: Diagnosis not present

## 2016-12-01 DIAGNOSIS — I10 Essential (primary) hypertension: Secondary | ICD-10-CM | POA: Diagnosis not present

## 2016-12-01 DIAGNOSIS — D630 Anemia in neoplastic disease: Secondary | ICD-10-CM | POA: Diagnosis not present

## 2016-12-01 DIAGNOSIS — R634 Abnormal weight loss: Secondary | ICD-10-CM | POA: Diagnosis not present

## 2016-12-01 DIAGNOSIS — C50919 Malignant neoplasm of unspecified site of unspecified female breast: Secondary | ICD-10-CM | POA: Diagnosis not present

## 2016-12-03 DIAGNOSIS — C799 Secondary malignant neoplasm of unspecified site: Secondary | ICD-10-CM | POA: Diagnosis not present

## 2016-12-03 DIAGNOSIS — I10 Essential (primary) hypertension: Secondary | ICD-10-CM | POA: Diagnosis not present

## 2016-12-03 DIAGNOSIS — R634 Abnormal weight loss: Secondary | ICD-10-CM | POA: Diagnosis not present

## 2016-12-03 DIAGNOSIS — C50919 Malignant neoplasm of unspecified site of unspecified female breast: Secondary | ICD-10-CM | POA: Diagnosis not present

## 2016-12-03 DIAGNOSIS — D630 Anemia in neoplastic disease: Secondary | ICD-10-CM | POA: Diagnosis not present

## 2016-12-04 DIAGNOSIS — R634 Abnormal weight loss: Secondary | ICD-10-CM | POA: Diagnosis not present

## 2016-12-04 DIAGNOSIS — C50919 Malignant neoplasm of unspecified site of unspecified female breast: Secondary | ICD-10-CM | POA: Diagnosis not present

## 2016-12-04 DIAGNOSIS — C799 Secondary malignant neoplasm of unspecified site: Secondary | ICD-10-CM | POA: Diagnosis not present

## 2016-12-04 DIAGNOSIS — D630 Anemia in neoplastic disease: Secondary | ICD-10-CM | POA: Diagnosis not present

## 2016-12-04 DIAGNOSIS — I10 Essential (primary) hypertension: Secondary | ICD-10-CM | POA: Diagnosis not present

## 2016-12-07 DIAGNOSIS — C50919 Malignant neoplasm of unspecified site of unspecified female breast: Secondary | ICD-10-CM | POA: Diagnosis not present

## 2016-12-07 DIAGNOSIS — I10 Essential (primary) hypertension: Secondary | ICD-10-CM | POA: Diagnosis not present

## 2016-12-07 DIAGNOSIS — R634 Abnormal weight loss: Secondary | ICD-10-CM | POA: Diagnosis not present

## 2016-12-07 DIAGNOSIS — C799 Secondary malignant neoplasm of unspecified site: Secondary | ICD-10-CM | POA: Diagnosis not present

## 2016-12-07 DIAGNOSIS — D630 Anemia in neoplastic disease: Secondary | ICD-10-CM | POA: Diagnosis not present

## 2016-12-25 DEATH — deceased

## 2017-05-14 NOTE — Telephone Encounter (Signed)
No entry 

## 2017-08-24 IMAGING — US US EXTREM UP*L* LTD
1 series · 14 of 18 positions shown · non-contrast
Comparison: Correlation made with CT from 12/16/2015.

CLINICAL DATA: 69-year-old female with history of remote right
breast cancer status post right mastectomy. The patient had a a CT
demonstrating multiple abnormal left axillary lymph nodes. The
patient has a red inflamed tender left breast and significant left
upper extremity swelling.

EXAM:
ULTRASOUND LEFT UPPER EXTREMITY LIMITED
TECHNIQUE: Ultrasound examination of the upper extremity soft tissues was
performed in the area of clinical concern.

[Series 1: us extrem up*left* ltd · 0.09mm/px · 14 of 18 slices shown]
[im 1/18]
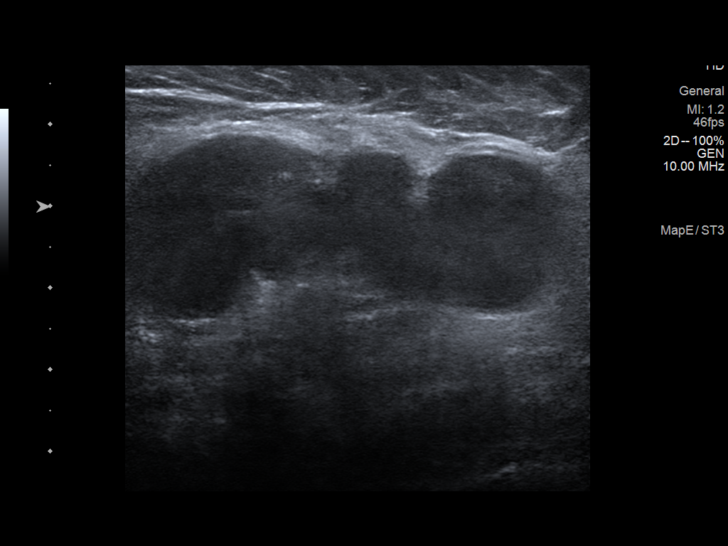
[im 2/18]
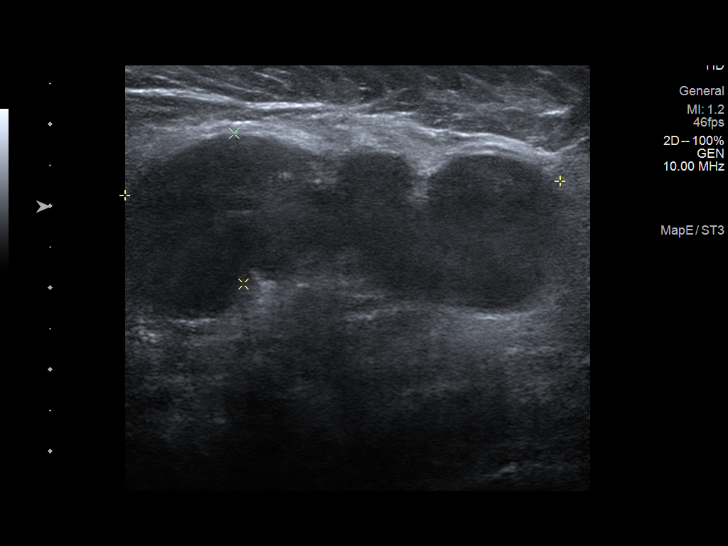
[im 4/18]
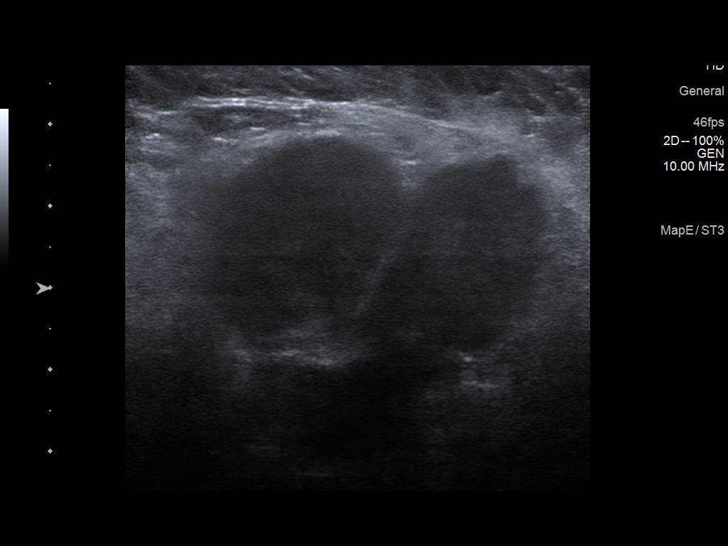
[im 5/18]
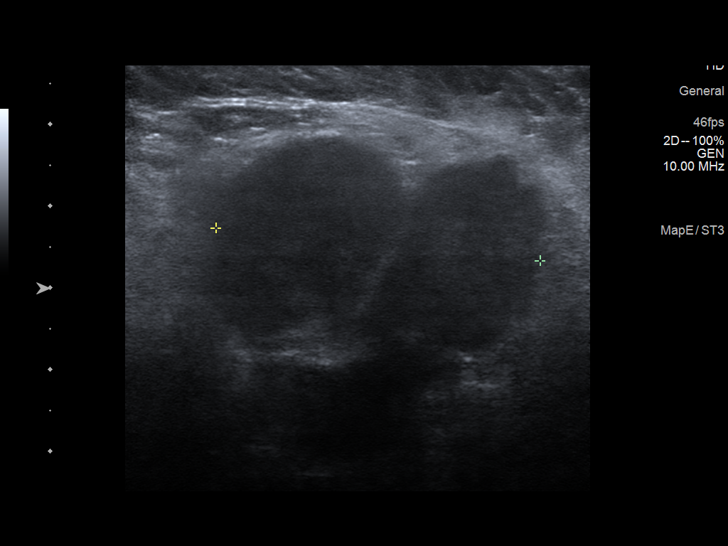
[im 6/18]
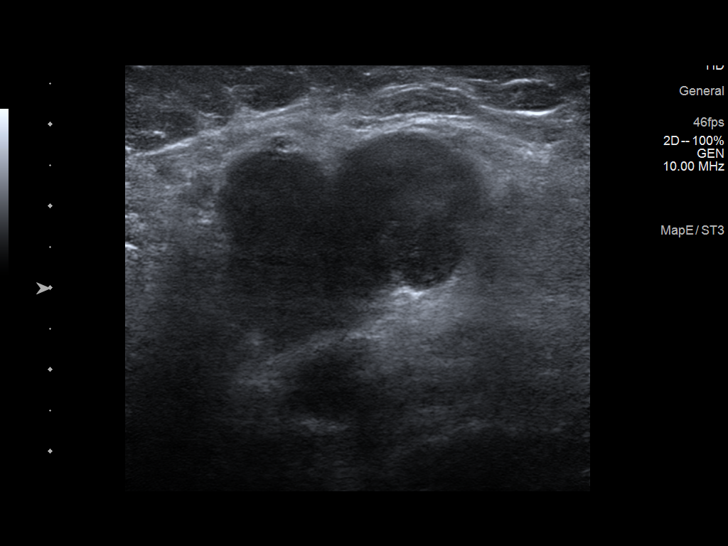
[im 8/18]
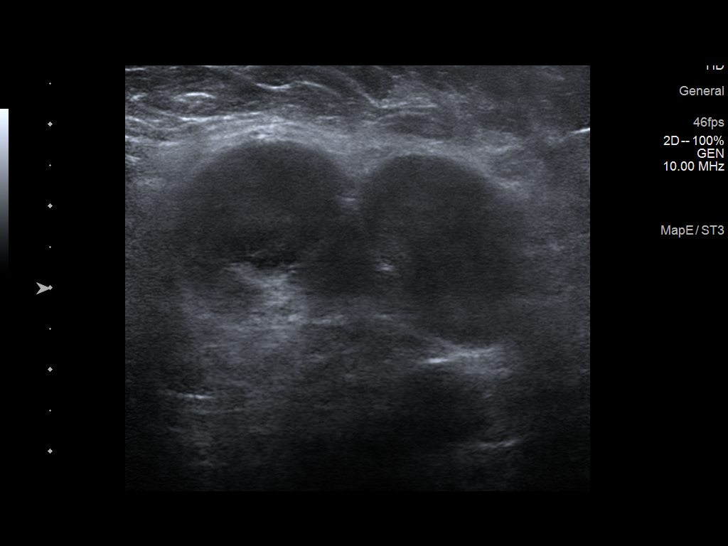
[im 9/18]
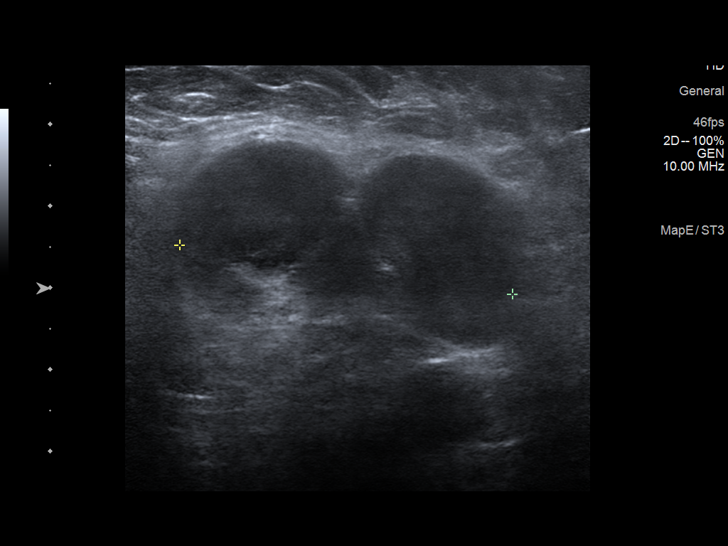
[im 10/18]
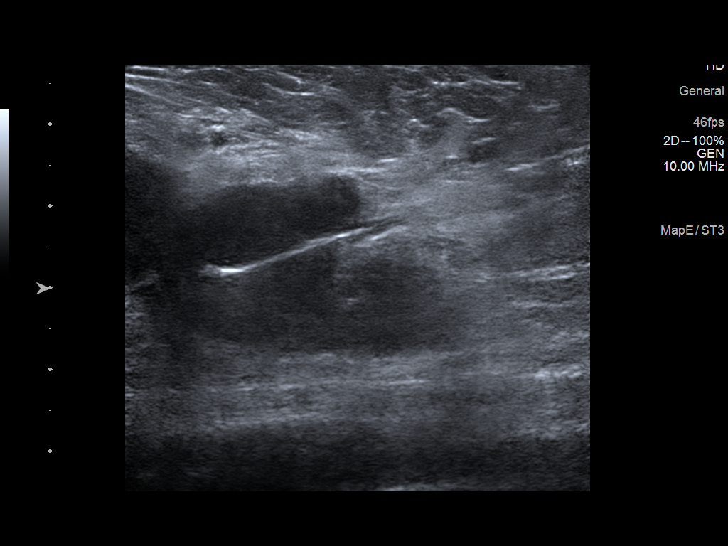
[im 11/18]
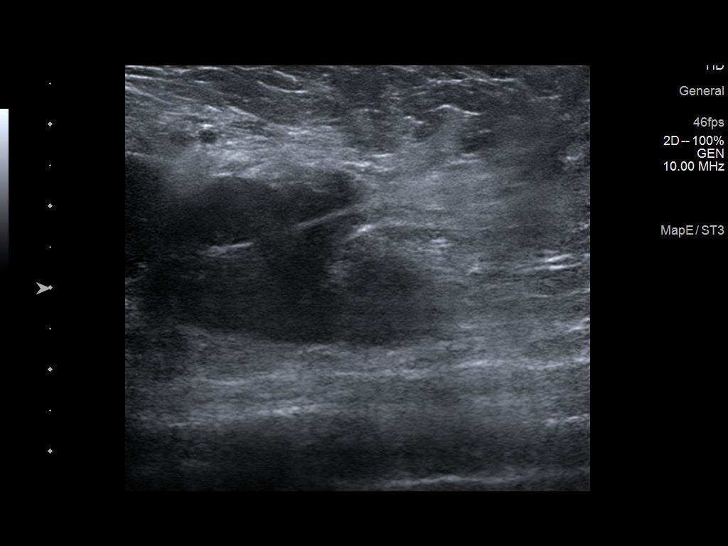
[im 13/18]
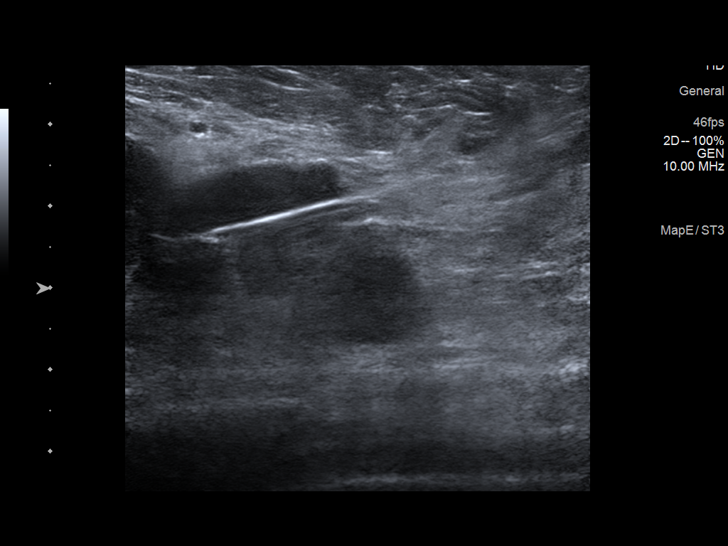
[im 14/18]
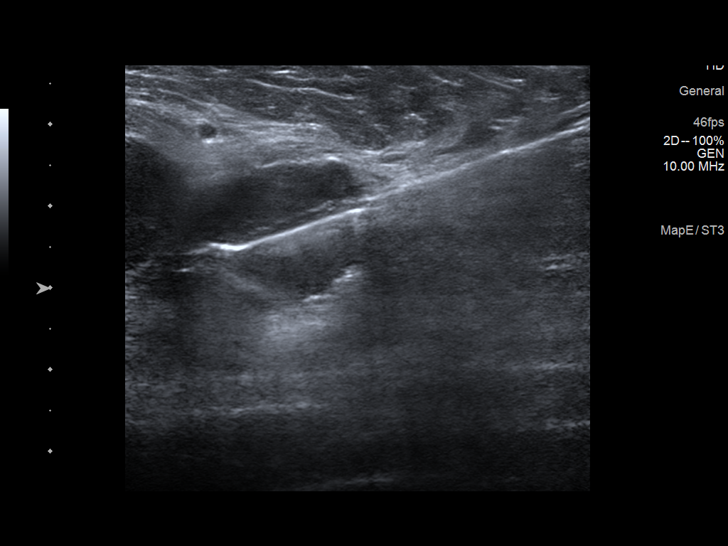
[im 15/18]
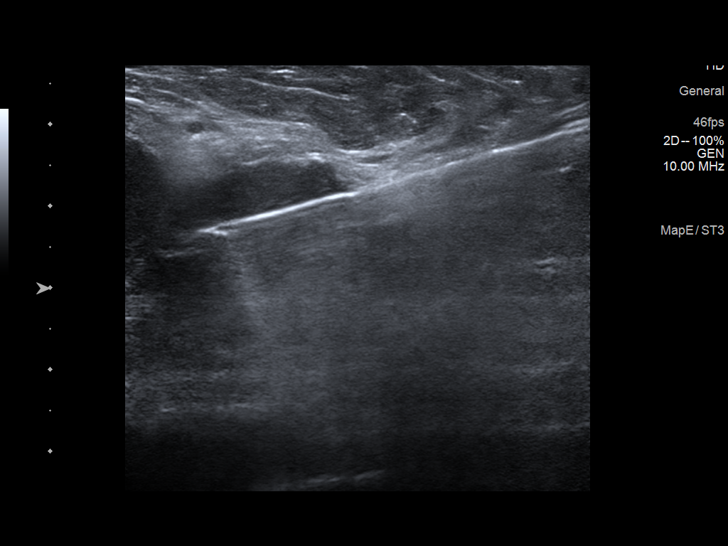
[im 17/18]
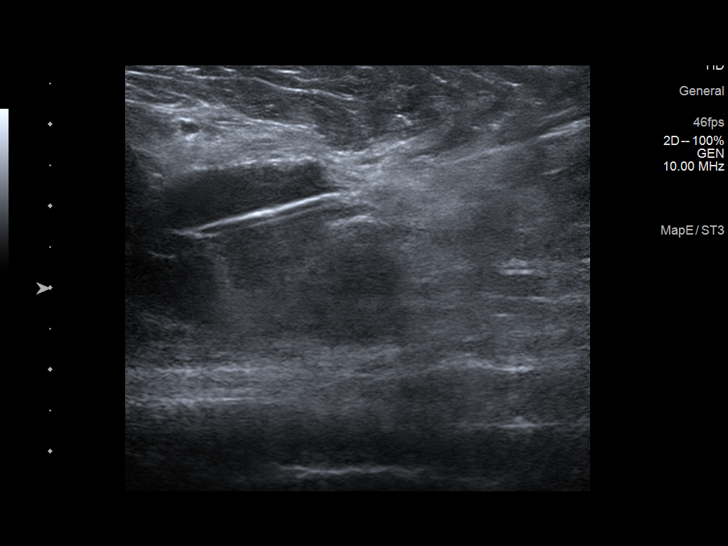
[im 18/18]
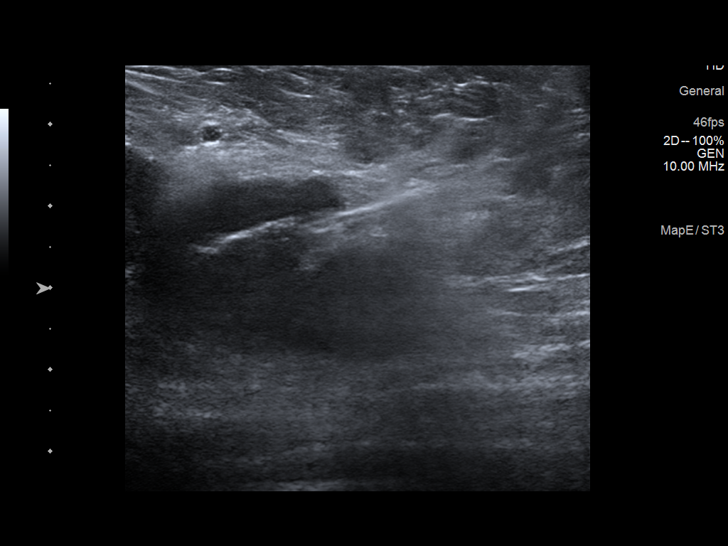

[14 of 18 positions shown; findings below may reference images not displayed]

FINDINGS: There are multiple markedly enlarged masses in the left axilla. One
of these masses measures 4.1 cm in long axis with a cortex of
cm.
IMPRESSION: Multiple markedly abnormal left axillary lymph nodes. The patient
has refused mammogram due to pain. As discussed previously with Dr.
Ragavan, we will proceed with a left axillary biopsy today for
tissue diagnosis.

## 2017-09-05 IMAGING — DX DG CHEST 2V
2 series · 2 of 2 positions shown · non-contrast
Comparison: Chest CT, 12/16/2015.  Chest radiograph, 08/15/2012.

CLINICAL DATA: No chest c/o. Nonsmoker. Pre op for port-a-cath
insertion. Hx of breast ca

EXAM:
CHEST  2 VIEW

[chest pa]
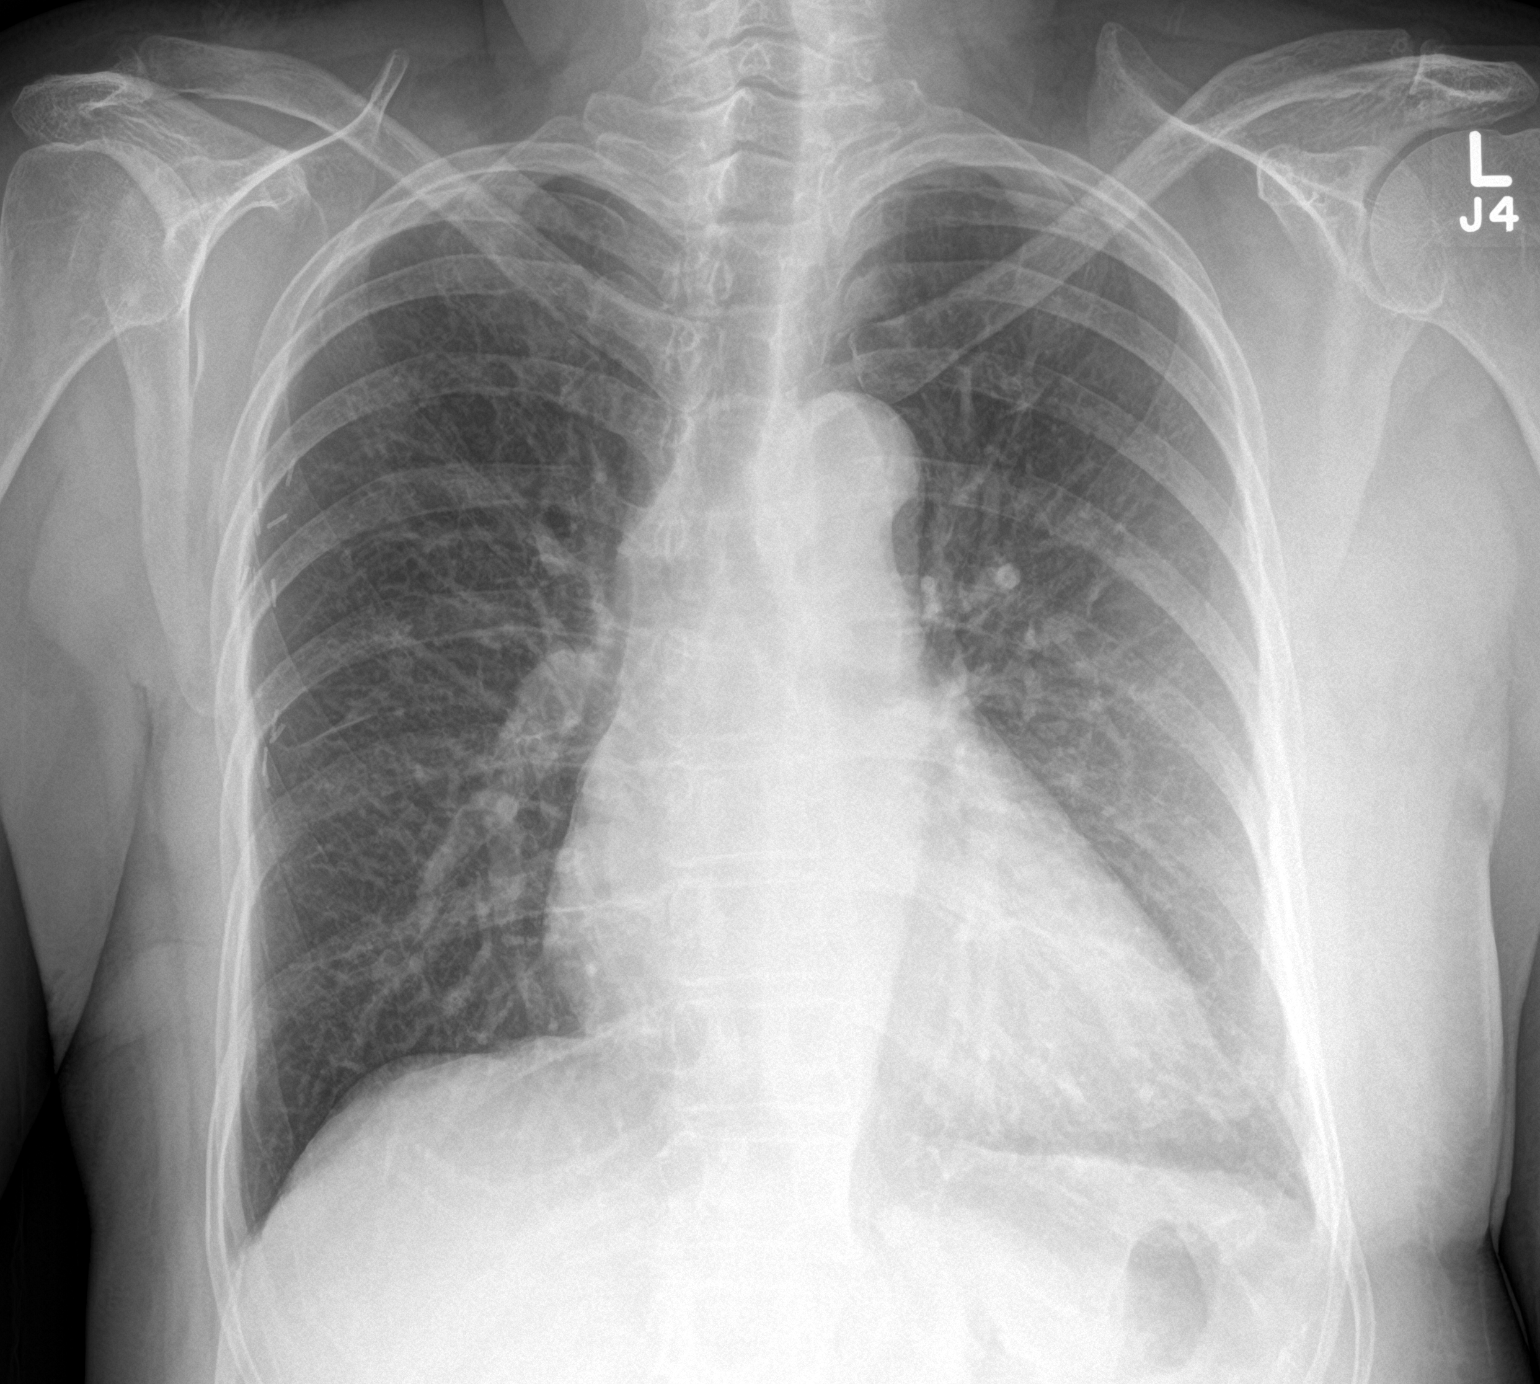

[chest lat]
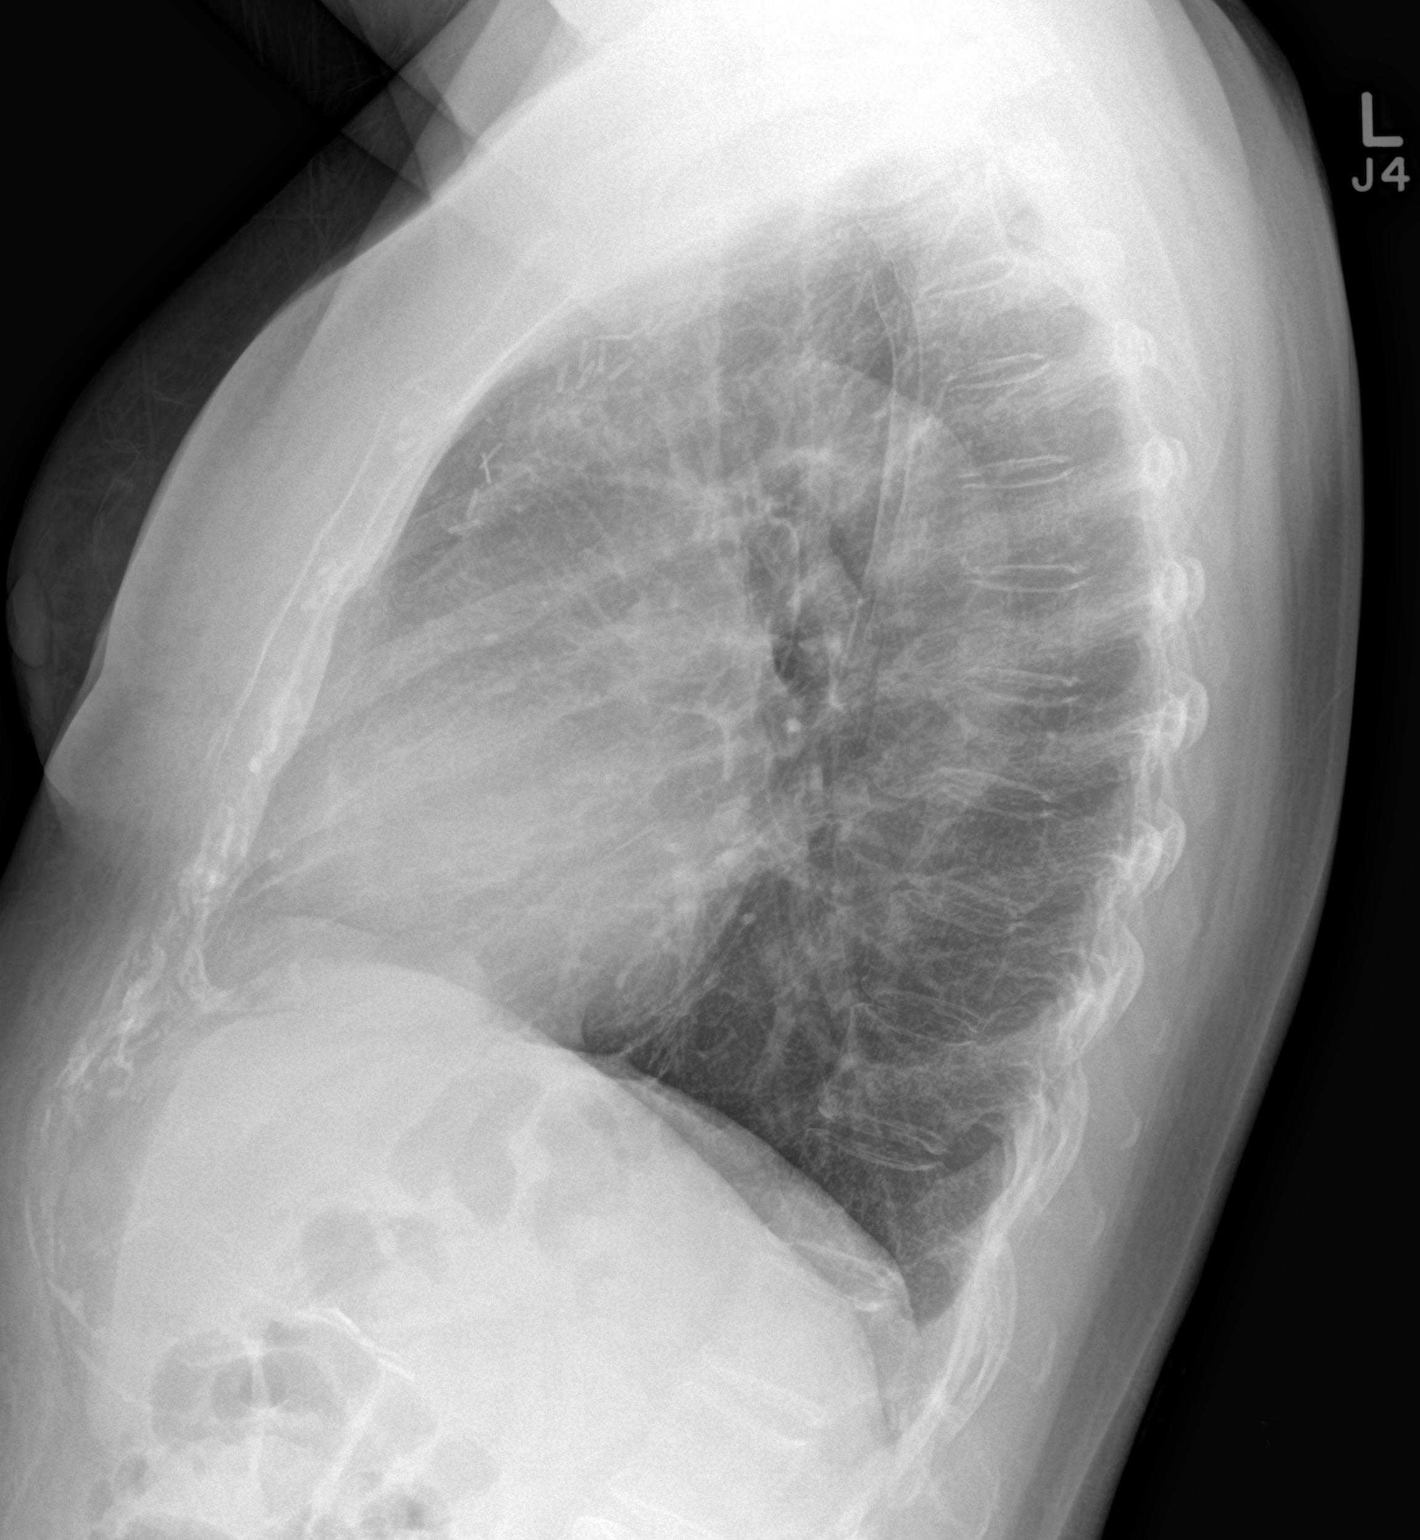

[2 of 2 positions shown; findings below may reference images not displayed]

FINDINGS: Mild enlargement of cardiac silhouette. No mediastinal or hilar
masses or evidence of adenopathy.

Lungs are clear.  No pleural effusion or pneumothorax.

Bony thorax demineralized intact. No convincing osteoblastic or
osteolytic lesion.

There changes from right mastectomy. Soft tissue fullness along the
left axilla is consistent known adenopathy.
IMPRESSION: No acute cardiopulmonary disease.

## 2018-03-21 IMAGING — CR DG CHEST 2V
2 series · 2 of 2 positions shown · non-contrast
Comparison: 06/02/2016

CLINICAL DATA: Preop for left mastectomy

EXAM:
CHEST  2 VIEW

[w chest pa]
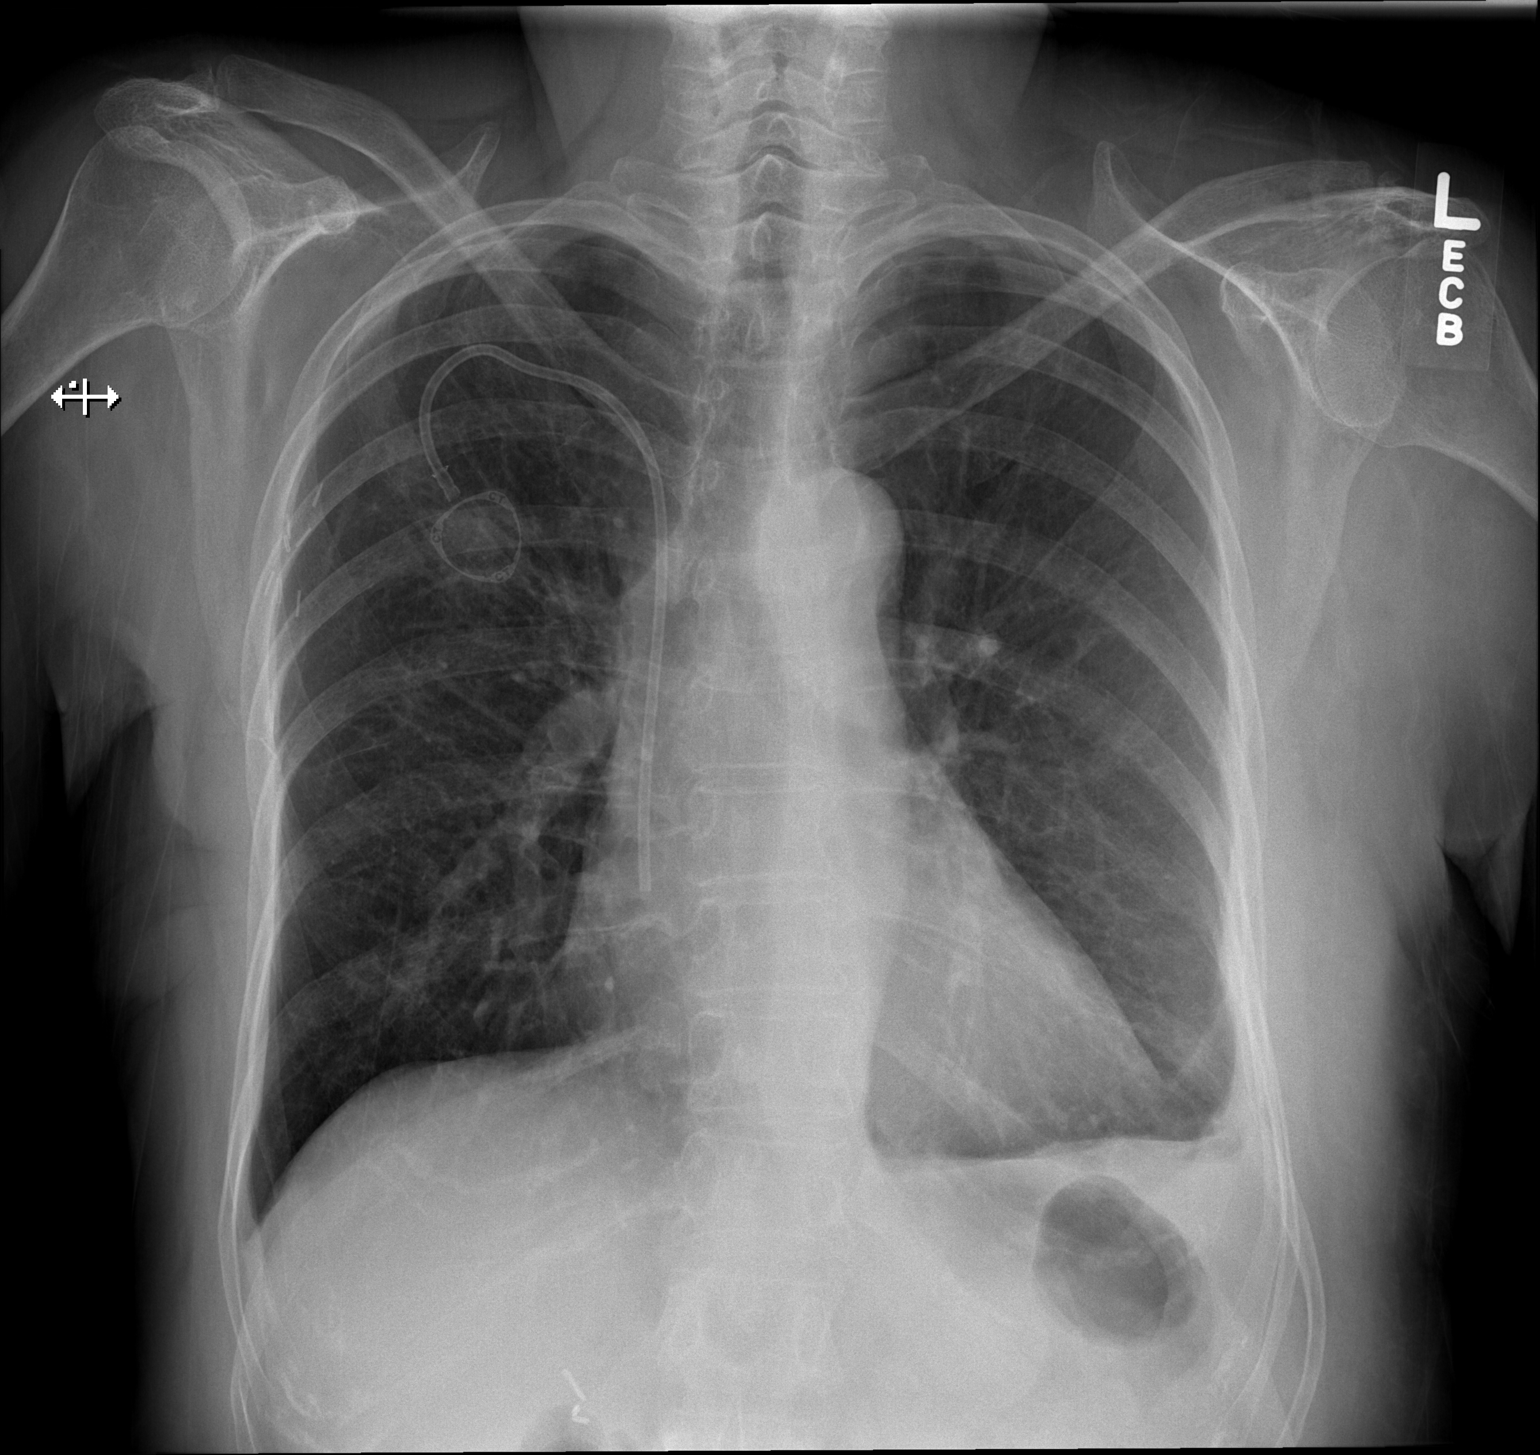

[w chest lat]
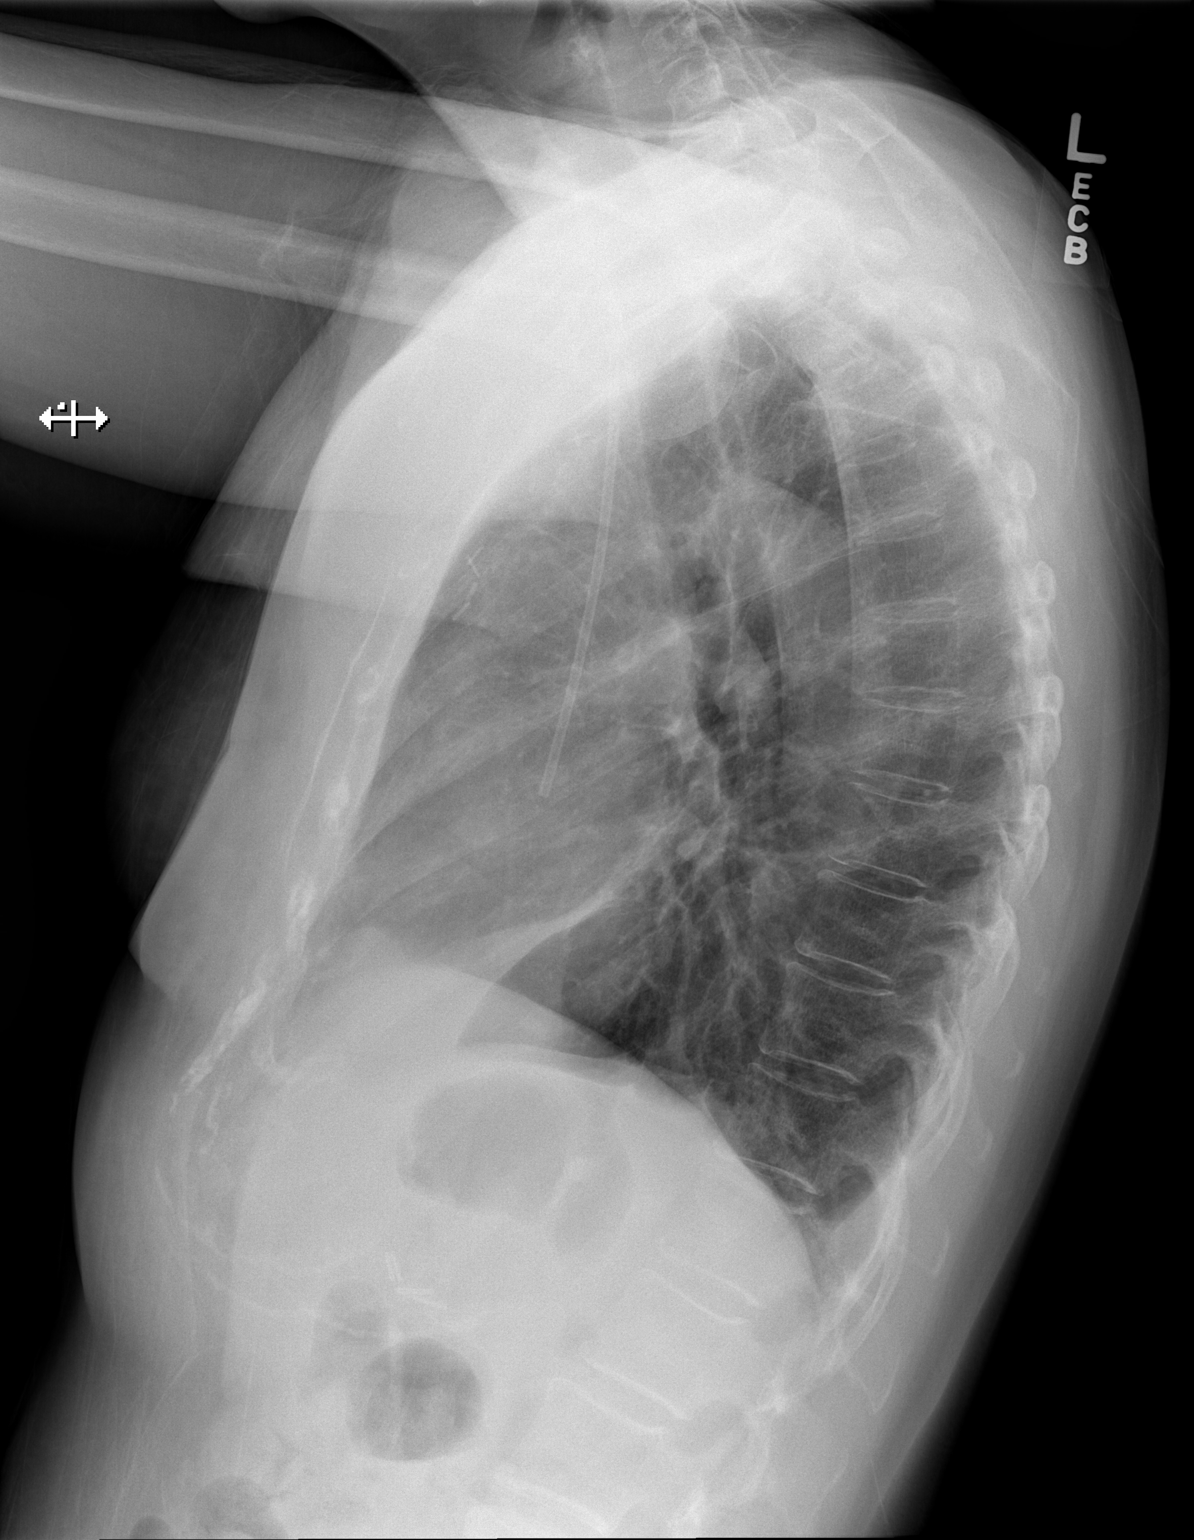

[2 of 2 positions shown; findings below may reference images not displayed]

FINDINGS: Cardiomediastinal silhouette is stable. No infiltrate or pleural
effusion. No pulmonary edema. There is some pleural thickening or
scarring left costophrenic angle. Right Port-A-Cath with tip in
right atrium. No pneumothorax. Surgical clips are noted in right
axilla.
IMPRESSION: No infiltrate or pleural effusion. No pulmonary edema. There is some
pleural thickening or scarring left costophrenic angle. Right
Port-A-Cath with tip in right atrium. No pneumothorax. Surgical
clips are noted in right axilla..

## 2018-12-16 ENCOUNTER — Encounter
